# Patient Record
Sex: Female | Born: 1950
Health system: Southern US, Community
[De-identification: ages and names within clinical notes are randomized; demographics above are authoritative.]

## PROBLEM LIST (undated history)

## (undated) DIAGNOSIS — T7840XA Allergy, unspecified, initial encounter: Secondary | ICD-10-CM

## (undated) DIAGNOSIS — K219 Gastro-esophageal reflux disease without esophagitis: Secondary | ICD-10-CM

## (undated) DIAGNOSIS — G2581 Restless legs syndrome: Secondary | ICD-10-CM

## (undated) DIAGNOSIS — G894 Chronic pain syndrome: Secondary | ICD-10-CM

## (undated) DIAGNOSIS — F32A Depression, unspecified: Secondary | ICD-10-CM

## (undated) DIAGNOSIS — I2510 Atherosclerotic heart disease of native coronary artery without angina pectoris: Secondary | ICD-10-CM

## (undated) DIAGNOSIS — M5136 Other intervertebral disc degeneration, lumbar region: Secondary | ICD-10-CM

## (undated) DIAGNOSIS — R7302 Impaired glucose tolerance (oral): Secondary | ICD-10-CM

## (undated) DIAGNOSIS — I10 Essential (primary) hypertension: Secondary | ICD-10-CM

## (undated) DIAGNOSIS — I739 Peripheral vascular disease, unspecified: Secondary | ICD-10-CM

## (undated) DIAGNOSIS — M810 Age-related osteoporosis without current pathological fracture: Secondary | ICD-10-CM

## (undated) DIAGNOSIS — E039 Hypothyroidism, unspecified: Secondary | ICD-10-CM

## (undated) DIAGNOSIS — H269 Unspecified cataract: Secondary | ICD-10-CM

## (undated) DIAGNOSIS — E785 Hyperlipidemia, unspecified: Secondary | ICD-10-CM

## (undated) DIAGNOSIS — I639 Cerebral infarction, unspecified: Secondary | ICD-10-CM

## (undated) DIAGNOSIS — F1910 Other psychoactive substance abuse, uncomplicated: Secondary | ICD-10-CM

## (undated) DIAGNOSIS — J45909 Unspecified asthma, uncomplicated: Secondary | ICD-10-CM

## (undated) DIAGNOSIS — J309 Allergic rhinitis, unspecified: Secondary | ICD-10-CM

## (undated) DIAGNOSIS — F419 Anxiety disorder, unspecified: Secondary | ICD-10-CM

## (undated) DIAGNOSIS — G459 Transient cerebral ischemic attack, unspecified: Secondary | ICD-10-CM

## (undated) DIAGNOSIS — F1021 Alcohol dependence, in remission: Secondary | ICD-10-CM

## (undated) DIAGNOSIS — Z0000 Encounter for general adult medical examination without abnormal findings: Secondary | ICD-10-CM

## (undated) DIAGNOSIS — N951 Menopausal and female climacteric states: Secondary | ICD-10-CM

## (undated) DIAGNOSIS — Z9189 Other specified personal risk factors, not elsewhere classified: Secondary | ICD-10-CM

## (undated) DIAGNOSIS — Z86010 Personal history of colonic polyps: Secondary | ICD-10-CM

## (undated) HISTORY — DX: Anxiety disorder, unspecified: F41.9

## (undated) HISTORY — DX: Age-related osteoporosis without current pathological fracture: M81.0

## (undated) HISTORY — DX: Hyperlipidemia, unspecified: E78.5

## (undated) HISTORY — DX: Impaired glucose tolerance (oral): R73.02

## (undated) HISTORY — DX: Other psychoactive substance abuse, uncomplicated: F19.10

## (undated) HISTORY — DX: Allergic rhinitis, unspecified: J30.9

## (undated) HISTORY — DX: Unspecified asthma, uncomplicated: J45.909

## (undated) HISTORY — DX: Encounter for general adult medical examination without abnormal findings: Z00.00

## (undated) HISTORY — PX: OTHER SURGICAL HISTORY: SHX169

## (undated) HISTORY — PX: BREAST SURGERY: SHX581

## (undated) HISTORY — DX: Hypothyroidism, unspecified: E03.9

## (undated) HISTORY — DX: Other specified personal risk factors, not elsewhere classified: Z91.89

## (undated) HISTORY — DX: Depression, unspecified: F32.A

## (undated) HISTORY — DX: Other intervertebral disc degeneration, lumbar region: M51.36

## (undated) HISTORY — PX: COLONOSCOPY: SHX174

## (undated) HISTORY — DX: Cerebral infarction, unspecified: I63.9

## (undated) HISTORY — DX: Alcohol dependence, in remission: F10.21

## (undated) HISTORY — PX: BREAST EXCISIONAL BIOPSY: SUR124

## (undated) HISTORY — DX: Chronic pain syndrome: G89.4

## (undated) HISTORY — DX: Restless legs syndrome: G25.81

## (undated) HISTORY — DX: Menopausal and female climacteric states: N95.1

## (undated) HISTORY — DX: Unspecified cataract: H26.9

## (undated) HISTORY — DX: Allergy, unspecified, initial encounter: T78.40XA

## (undated) HISTORY — DX: Essential (primary) hypertension: I10

## (undated) HISTORY — DX: Transient cerebral ischemic attack, unspecified: G45.9

## (undated) HISTORY — DX: Personal history of colonic polyps: Z86.010

---

## 2000-12-14 ENCOUNTER — Encounter: Payer: Self-pay | Admitting: Internal Medicine

## 2001-04-18 ENCOUNTER — Encounter: Admission: RE | Admit: 2001-04-18 | Discharge: 2001-04-18 | Payer: Self-pay | Admitting: Internal Medicine

## 2001-04-18 ENCOUNTER — Encounter: Payer: Self-pay | Admitting: Internal Medicine

## 2003-03-10 ENCOUNTER — Emergency Department (HOSPITAL_COMMUNITY): Admission: EM | Admit: 2003-03-10 | Discharge: 2003-03-10 | Payer: Self-pay | Admitting: Emergency Medicine

## 2003-03-10 ENCOUNTER — Encounter: Payer: Self-pay | Admitting: Emergency Medicine

## 2003-09-15 ENCOUNTER — Emergency Department (HOSPITAL_COMMUNITY): Admission: EM | Admit: 2003-09-15 | Discharge: 2003-09-15 | Payer: Self-pay | Admitting: Emergency Medicine

## 2004-04-07 ENCOUNTER — Ambulatory Visit (HOSPITAL_COMMUNITY): Admission: RE | Admit: 2004-04-07 | Discharge: 2004-04-07 | Payer: Self-pay | Admitting: General Surgery

## 2005-09-11 ENCOUNTER — Ambulatory Visit: Payer: Self-pay | Admitting: Internal Medicine

## 2005-09-14 ENCOUNTER — Ambulatory Visit: Payer: Self-pay | Admitting: Internal Medicine

## 2005-09-22 ENCOUNTER — Ambulatory Visit: Payer: Self-pay | Admitting: Internal Medicine

## 2006-02-02 ENCOUNTER — Ambulatory Visit: Payer: Self-pay | Admitting: Internal Medicine

## 2006-09-29 ENCOUNTER — Ambulatory Visit: Payer: Self-pay | Admitting: Internal Medicine

## 2006-10-29 ENCOUNTER — Ambulatory Visit: Payer: Self-pay | Admitting: Internal Medicine

## 2006-10-29 LAB — CONVERTED CEMR LAB
ALT: 14 units/L (ref 0–40)
AST: 21 units/L (ref 0–37)
Albumin: 3.8 g/dL (ref 3.5–5.2)
Alkaline Phosphatase: 67 units/L (ref 39–117)
BUN: 18 mg/dL (ref 6–23)
Basophils Absolute: 0 10*3/uL (ref 0.0–0.1)
Basophils Relative: 0.4 % (ref 0.0–1.0)
Bilirubin Urine: NEGATIVE
CO2: 31 meq/L (ref 19–32)
Calcium: 9.4 mg/dL (ref 8.4–10.5)
Chloride: 103 meq/L (ref 96–112)
Chol/HDL Ratio, serum: 2.6
Cholesterol: 199 mg/dL (ref 0–200)
Creatinine, Ser: 0.9 mg/dL (ref 0.4–1.2)
Eosinophil percent: 1.6 % (ref 0.0–5.0)
GFR calc non Af Amer: 69 mL/min
Glomerular Filtration Rate, Af Am: 84 mL/min/{1.73_m2}
Glucose, Bld: 101 mg/dL — ABNORMAL HIGH (ref 70–99)
HCT: 37 % (ref 36.0–46.0)
HDL: 75.8 mg/dL (ref >39.0)
Hemoglobin, Urine: NEGATIVE
Hemoglobin: 12.2 g/dL (ref 12.0–15.0)
Ketones, ur: NEGATIVE mg/dL
LDL Cholesterol: 113 mg/dL — ABNORMAL HIGH (ref 0–99)
Leukocytes, UA: NEGATIVE
Lymphocytes Relative: 33 % (ref 12.0–46.0)
MCHC: 33.1 g/dL (ref 30.0–36.0)
MCV: 92.2 fL (ref 78.0–100.0)
Monocytes Absolute: 0.5 10*3/uL (ref 0.2–0.7)
Monocytes Relative: 8.5 % (ref 3.0–11.0)
Neutro Abs: 3 10*3/uL (ref 1.4–7.7)
Neutrophils Relative %: 56.5 % (ref 43.0–77.0)
Nitrite: NEGATIVE
Platelets: 176 10*3/uL (ref 150–400)
Potassium: 4.1 meq/L (ref 3.5–5.1)
RBC: 4.01 M/uL (ref 3.87–5.11)
RDW: 12.6 % (ref 11.5–14.6)
Sodium: 142 meq/L (ref 135–145)
Specific Gravity, Urine: 1.015 (ref 1.000–1.03)
TSH: 1.04 microintl units/mL (ref 0.35–5.50)
Total Bilirubin: 0.9 mg/dL (ref 0.3–1.2)
Total Protein, Urine: NEGATIVE mg/dL
Total Protein: 6.9 g/dL (ref 6.0–8.3)
Triglyceride fasting, serum: 50 mg/dL (ref 0–149)
Urine Glucose: NEGATIVE mg/dL
Urobilinogen, UA: 0.2 (ref 0.0–1.0)
VLDL: 10 mg/dL (ref 0–40)
WBC: 5.3 10*3/uL (ref 4.5–10.5)
pH: 7 (ref 5.0–8.0)

## 2006-11-03 ENCOUNTER — Ambulatory Visit: Payer: Self-pay | Admitting: Internal Medicine

## 2006-11-18 ENCOUNTER — Ambulatory Visit: Payer: Self-pay | Admitting: Internal Medicine

## 2006-12-03 ENCOUNTER — Ambulatory Visit: Payer: Self-pay | Admitting: Internal Medicine

## 2006-12-03 ENCOUNTER — Encounter (INDEPENDENT_AMBULATORY_CARE_PROVIDER_SITE_OTHER): Payer: Self-pay | Admitting: *Deleted

## 2007-03-10 ENCOUNTER — Other Ambulatory Visit: Admission: RE | Admit: 2007-03-10 | Discharge: 2007-03-10 | Payer: Self-pay | Admitting: Radiology

## 2007-04-14 LAB — CONVERTED CEMR LAB: Pap Smear: NORMAL

## 2007-04-20 ENCOUNTER — Ambulatory Visit (HOSPITAL_BASED_OUTPATIENT_CLINIC_OR_DEPARTMENT_OTHER): Admission: RE | Admit: 2007-04-20 | Discharge: 2007-04-20 | Payer: Self-pay | Admitting: General Surgery

## 2007-04-20 ENCOUNTER — Encounter (INDEPENDENT_AMBULATORY_CARE_PROVIDER_SITE_OTHER): Payer: Self-pay | Admitting: Specialist

## 2007-05-04 ENCOUNTER — Ambulatory Visit (HOSPITAL_BASED_OUTPATIENT_CLINIC_OR_DEPARTMENT_OTHER): Admission: RE | Admit: 2007-05-04 | Discharge: 2007-05-04 | Payer: Self-pay | Admitting: General Surgery

## 2007-09-13 ENCOUNTER — Encounter: Payer: Self-pay | Admitting: Internal Medicine

## 2007-09-13 DIAGNOSIS — I10 Essential (primary) hypertension: Secondary | ICD-10-CM

## 2007-09-13 DIAGNOSIS — N951 Menopausal and female climacteric states: Secondary | ICD-10-CM

## 2007-09-13 DIAGNOSIS — F5104 Psychophysiologic insomnia: Secondary | ICD-10-CM | POA: Insufficient documentation

## 2007-09-13 DIAGNOSIS — J45909 Unspecified asthma, uncomplicated: Secondary | ICD-10-CM

## 2007-09-13 DIAGNOSIS — Z9189 Other specified personal risk factors, not elsewhere classified: Secondary | ICD-10-CM

## 2007-09-13 HISTORY — DX: Other specified personal risk factors, not elsewhere classified: Z91.89

## 2007-09-13 HISTORY — DX: Essential (primary) hypertension: I10

## 2007-09-13 HISTORY — DX: Menopausal and female climacteric states: N95.1

## 2007-09-13 HISTORY — DX: Unspecified asthma, uncomplicated: J45.909

## 2007-11-06 DIAGNOSIS — F1021 Alcohol dependence, in remission: Secondary | ICD-10-CM

## 2007-11-06 DIAGNOSIS — E039 Hypothyroidism, unspecified: Secondary | ICD-10-CM

## 2007-11-06 DIAGNOSIS — M810 Age-related osteoporosis without current pathological fracture: Secondary | ICD-10-CM

## 2007-11-06 DIAGNOSIS — J309 Allergic rhinitis, unspecified: Secondary | ICD-10-CM | POA: Insufficient documentation

## 2007-11-06 HISTORY — DX: Alcohol dependence, in remission: F10.21

## 2007-11-06 HISTORY — DX: Age-related osteoporosis without current pathological fracture: M81.0

## 2007-11-06 HISTORY — DX: Allergic rhinitis, unspecified: J30.9

## 2007-11-06 HISTORY — DX: Hypothyroidism, unspecified: E03.9

## 2007-12-29 ENCOUNTER — Encounter: Payer: Self-pay | Admitting: Internal Medicine

## 2008-01-27 ENCOUNTER — Ambulatory Visit: Payer: Self-pay | Admitting: Internal Medicine

## 2008-01-27 LAB — CONVERTED CEMR LAB
ALT: 13 units/L (ref 0–35)
AST: 16 units/L (ref 0–37)
Albumin: 4.2 g/dL (ref 3.5–5.2)
Alkaline Phosphatase: 57 units/L (ref 39–117)
BUN: 15 mg/dL (ref 6–23)
Basophils Absolute: 0 10*3/uL (ref 0.0–0.1)
Basophils Relative: 0.2 % (ref 0.0–1.0)
Bilirubin Urine: NEGATIVE
CO2: 34 meq/L — ABNORMAL HIGH (ref 19–32)
Calcium: 9.6 mg/dL (ref 8.4–10.5)
Chloride: 104 meq/L (ref 96–112)
Creatinine, Ser: 0.8 mg/dL (ref 0.4–1.2)
HDL: 83.7 mg/dL (ref >39.0)
Hemoglobin, Urine: NEGATIVE
Ketones, ur: NEGATIVE mg/dL
LDL Cholesterol: 104 mg/dL — ABNORMAL HIGH (ref 0–99)
Leukocytes, UA: NEGATIVE
MCHC: 33.2 g/dL (ref 30.0–36.0)
Monocytes Relative: 9.7 % (ref 3.0–11.0)
Platelets: 202 10*3/uL (ref 150–400)
Potassium: 3.9 meq/L (ref 3.5–5.1)
RBC: 4.4 M/uL (ref 3.87–5.11)
RDW: 13.2 % (ref 11.5–14.6)
Specific Gravity, Urine: 1.015 (ref 1.000–1.03)
Total Bilirubin: 0.9 mg/dL (ref 0.3–1.2)
Total Protein, Urine: NEGATIVE mg/dL
Triglycerides: 54 mg/dL (ref 0–149)
VLDL: 11 mg/dL (ref 0–40)
pH: 6 (ref 5.0–8.0)

## 2008-02-09 ENCOUNTER — Ambulatory Visit: Payer: Self-pay | Admitting: Internal Medicine

## 2008-02-09 DIAGNOSIS — Z860101 Personal history of adenomatous and serrated colon polyps: Secondary | ICD-10-CM | POA: Insufficient documentation

## 2008-02-09 DIAGNOSIS — Z860100 Personal history of colon polyps, unspecified: Secondary | ICD-10-CM

## 2008-02-09 HISTORY — DX: Personal history of colonic polyps: Z86.010

## 2008-02-09 HISTORY — DX: Personal history of colon polyps, unspecified: Z86.0100

## 2008-02-14 ENCOUNTER — Encounter: Payer: Self-pay | Admitting: Internal Medicine

## 2008-02-14 ENCOUNTER — Ambulatory Visit: Payer: Self-pay | Admitting: Internal Medicine

## 2008-08-09 ENCOUNTER — Encounter: Payer: Self-pay | Admitting: Internal Medicine

## 2008-08-29 ENCOUNTER — Telehealth: Payer: Self-pay | Admitting: Internal Medicine

## 2009-02-13 ENCOUNTER — Ambulatory Visit: Payer: Self-pay | Admitting: Internal Medicine

## 2009-02-14 LAB — CONVERTED CEMR LAB
Alkaline Phosphatase: 65 units/L (ref 39–117)
Bilirubin Urine: NEGATIVE
Bilirubin, Direct: 0.1 mg/dL (ref 0.0–0.3)
Eosinophils Absolute: 0.2 10*3/uL (ref 0.0–0.7)
GFR calc Af Amer: 95 mL/min
GFR calc non Af Amer: 79 mL/min
HCT: 39.7 % (ref 36.0–46.0)
HDL: 70 mg/dL (ref >39.0)
Hemoglobin, Urine: NEGATIVE
LDL Cholesterol: 104 mg/dL — ABNORMAL HIGH (ref 0–99)
MCV: 91.1 fL (ref 78.0–100.0)
Monocytes Absolute: 0.6 10*3/uL (ref 0.1–1.0)
Nitrite: NEGATIVE
Platelets: 200 10*3/uL (ref 150–400)
Potassium: 3.6 meq/L (ref 3.5–5.1)
RDW: 13.3 % (ref 11.5–14.6)
Sodium: 140 meq/L (ref 135–145)
Total Bilirubin: 0.8 mg/dL (ref 0.3–1.2)
Total Protein, Urine: NEGATIVE mg/dL
Urine Glucose: NEGATIVE mg/dL
Urobilinogen, UA: 0.2 (ref 0.0–1.0)
VLDL: 19 mg/dL (ref 0–40)

## 2009-03-16 ENCOUNTER — Emergency Department (HOSPITAL_COMMUNITY): Admission: EM | Admit: 2009-03-16 | Discharge: 2009-03-16 | Payer: Self-pay | Admitting: Family Medicine

## 2009-06-07 ENCOUNTER — Telehealth: Payer: Self-pay | Admitting: Internal Medicine

## 2009-08-05 ENCOUNTER — Telehealth: Payer: Self-pay | Admitting: Internal Medicine

## 2009-08-16 ENCOUNTER — Encounter: Payer: Self-pay | Admitting: Internal Medicine

## 2009-08-16 ENCOUNTER — Telehealth: Payer: Self-pay | Admitting: Internal Medicine

## 2009-08-20 ENCOUNTER — Encounter: Payer: Self-pay | Admitting: Internal Medicine

## 2009-10-11 ENCOUNTER — Encounter (INDEPENDENT_AMBULATORY_CARE_PROVIDER_SITE_OTHER): Payer: Self-pay | Admitting: *Deleted

## 2009-10-24 ENCOUNTER — Ambulatory Visit: Payer: Self-pay | Admitting: Internal Medicine

## 2009-10-24 LAB — CONVERTED CEMR LAB
ALT: 13 units/L (ref 0–35)
BUN: 14 mg/dL (ref 6–23)
Bilirubin, Direct: 0.1 mg/dL (ref 0.0–0.3)
Chloride: 102 meq/L (ref 96–112)
Cholesterol: 177 mg/dL (ref 0–200)
Creatinine, Ser: 0.8 mg/dL (ref 0.4–1.2)
Eosinophils Absolute: 0.1 10*3/uL (ref 0.0–0.7)
Eosinophils Relative: 2.7 % (ref 0.0–5.0)
GFR calc non Af Amer: 94.91 mL/min (ref >60)
HDL: 62.8 mg/dL (ref >39.00)
LDL Cholesterol: 102 mg/dL — ABNORMAL HIGH (ref 0–99)
MCV: 95.2 fL (ref 78.0–100.0)
Monocytes Absolute: 0.6 10*3/uL (ref 0.1–1.0)
Neutrophils Relative %: 46.8 % (ref 43.0–77.0)
Nitrite: NEGATIVE
Platelets: 180 10*3/uL (ref 150.0–400.0)
Specific Gravity, Urine: 1.015 (ref 1.000–1.030)
Total Bilirubin: 1 mg/dL (ref 0.3–1.2)
Total Protein, Urine: NEGATIVE mg/dL
Triglycerides: 60 mg/dL (ref 0.0–149.0)
VLDL: 12 mg/dL (ref 0.0–40.0)
WBC: 5.4 10*3/uL (ref 4.5–10.5)
pH: 6 (ref 5.0–8.0)

## 2009-11-01 ENCOUNTER — Ambulatory Visit: Payer: Self-pay | Admitting: Internal Medicine

## 2009-11-13 ENCOUNTER — Encounter (INDEPENDENT_AMBULATORY_CARE_PROVIDER_SITE_OTHER): Payer: Self-pay | Admitting: *Deleted

## 2009-11-19 ENCOUNTER — Encounter (INDEPENDENT_AMBULATORY_CARE_PROVIDER_SITE_OTHER): Payer: Self-pay | Admitting: *Deleted

## 2009-11-20 ENCOUNTER — Ambulatory Visit: Payer: Self-pay | Admitting: Internal Medicine

## 2009-12-04 ENCOUNTER — Ambulatory Visit: Payer: Self-pay | Admitting: Internal Medicine

## 2009-12-11 ENCOUNTER — Encounter: Payer: Self-pay | Admitting: Internal Medicine

## 2010-02-05 ENCOUNTER — Telehealth: Payer: Self-pay | Admitting: Internal Medicine

## 2010-07-25 ENCOUNTER — Telehealth: Payer: Self-pay | Admitting: Internal Medicine

## 2010-08-05 ENCOUNTER — Telehealth (INDEPENDENT_AMBULATORY_CARE_PROVIDER_SITE_OTHER): Payer: Self-pay | Admitting: *Deleted

## 2010-08-06 ENCOUNTER — Encounter: Payer: Self-pay | Admitting: Internal Medicine

## 2010-10-20 ENCOUNTER — Telehealth: Payer: Self-pay | Admitting: Internal Medicine

## 2010-10-24 ENCOUNTER — Ambulatory Visit: Payer: Self-pay | Admitting: Internal Medicine

## 2010-10-24 LAB — CONVERTED CEMR LAB
ALT: 11 units/L (ref 0–35)
AST: 19 units/L (ref 0–37)
BUN: 17 mg/dL (ref 6–23)
Basophils Absolute: 0 10*3/uL (ref 0.0–0.1)
Bilirubin Urine: NEGATIVE
Bilirubin, Direct: 0.2 mg/dL (ref 0.0–0.3)
Cholesterol: 206 mg/dL — ABNORMAL HIGH (ref 0–200)
Creatinine, Ser: 0.8 mg/dL (ref 0.4–1.2)
Eosinophils Relative: 2.2 % (ref 0.0–5.0)
GFR calc non Af Amer: 98.84 mL/min (ref >60)
HDL: 76 mg/dL (ref >39.00)
Leukocytes, UA: NEGATIVE
Monocytes Absolute: 0.7 10*3/uL (ref 0.1–1.0)
Monocytes Relative: 10.6 % (ref 3.0–12.0)
Neutrophils Relative %: 55 % (ref 43.0–77.0)
Nitrite: NEGATIVE
Platelets: 211 10*3/uL (ref 150.0–400.0)
RDW: 14.2 % (ref 11.5–14.6)
Specific Gravity, Urine: 1.01 (ref 1.000–1.030)
Total Bilirubin: 1 mg/dL (ref 0.3–1.2)
Total Protein, Urine: NEGATIVE mg/dL
Triglycerides: 50 mg/dL (ref 0.0–149.0)
VLDL: 10 mg/dL (ref 0.0–40.0)
WBC: 6.5 10*3/uL (ref 4.5–10.5)
pH: 6.5 (ref 5.0–8.0)

## 2010-11-03 ENCOUNTER — Ambulatory Visit: Payer: Self-pay | Admitting: Internal Medicine

## 2010-11-03 ENCOUNTER — Telehealth: Payer: Self-pay | Admitting: Internal Medicine

## 2010-11-03 ENCOUNTER — Encounter: Payer: Self-pay | Admitting: Internal Medicine

## 2010-11-03 DIAGNOSIS — E785 Hyperlipidemia, unspecified: Secondary | ICD-10-CM | POA: Insufficient documentation

## 2010-11-03 HISTORY — DX: Hyperlipidemia, unspecified: E78.5

## 2010-11-05 ENCOUNTER — Telehealth (INDEPENDENT_AMBULATORY_CARE_PROVIDER_SITE_OTHER): Payer: Self-pay | Admitting: *Deleted

## 2010-11-11 ENCOUNTER — Encounter: Payer: Self-pay | Admitting: Internal Medicine

## 2011-01-04 ENCOUNTER — Encounter: Payer: Self-pay | Admitting: Obstetrics and Gynecology

## 2011-01-13 NOTE — Progress Notes (Signed)
  Phone Note Refill Request Message from:  Fax from Pharmacy on November 03, 2010 9:30 AM  Refills Requested: Medication #1:  KLOR-CON 10 10 MEQ TBCR Take 1 tablet by mouth once a day   Dosage confirmed as above?Dosage Confirmed   Last Refilled: 10/2009   Notes: [facility] Initial call taken by: Zella Ball Ewing CMA (AAMA),  November 03, 2010 9:30 AM    Prescriptions: KLOR-CON 10 10 MEQ TBCR (POTASSIUM CHLORIDE) Take 1 tablet by mouth once a day  #30 x 0   Entered by:   Scharlene Gloss CMA (AAMA)   Authorized by:   Corwin Levins MD   Signed by:   Scharlene Gloss CMA (AAMA) on 11/03/2010   Method used:   Faxed to ...       [facility] [id number] (retail)       [address] [place], [place]  [address]       Ph: [phone]       Fax: [phone]   RxID:   [id number]

## 2011-01-13 NOTE — Medication Information (Signed)
Summary: Prior Autho & Approved for Zolpidem/[facility]  Prior Autho & Approved for Zolpidem/[facility]   Imported By: Sherian Rein 08/08/2010 13:36:52  _____________________________________________________________________  External Attachment:    Type:   Image     Comment:   External Document

## 2011-01-13 NOTE — Progress Notes (Signed)
Summary: RX  Phone Note Refill Request  on February 05, 2010 4:45 PM  Refills Requested: Medication #1:  ZOLPIDEM TARTRATE 10 MG TABS Take 1 tablet by mouth once a day.   Dosage confirmed as above?Dosage Confirmed   Last Refilled: 08/05/2009   Notes: [facility] [phone] Initial call taken by: Scharlene Gloss,  February 05, 2010 4:46 PM  Follow-up for Phone Call        done hardcopy to LIM side B - dahlia  Follow-up by: Corwin Levins MD,  February 05, 2010 5:19 PM  Additional Follow-up for Phone Call Additional follow up Details #1::        faxed. Additional Follow-up by: Lucious Groves,  February 06, 2010 9:32 AM    New/Updated Medications: ZOLPIDEM TARTRATE 10 MG TABS (ZOLPIDEM TARTRATE) Take 1 tablet by mouth once a day Prescriptions: ZOLPIDEM TARTRATE 10 MG TABS (ZOLPIDEM TARTRATE) Take 1 tablet by mouth once a day  #30 x 5   Entered and Authorized by:   Corwin Levins MD   Signed by:   Corwin Levins MD on 02/05/2010   Method used:   Print then Give to Patient   RxID:   [id number]

## 2011-01-13 NOTE — Progress Notes (Signed)
Summary: PA-Zolpidem  Phone Note From Pharmacy   Summary of Call: PA-Zolpidem fax to [facility] @ [phone], awaiting approval. Initial call taken by: Dagoberto Reef,  August 05, 2010 4:41 PM  Follow-up for Phone Call        Zolpidem approved 07/16/10-08/06/11, case # [id number]  pt aware, Follow-up by: Dagoberto Reef,  August 06, 2010 2:46 PM

## 2011-01-13 NOTE — Progress Notes (Signed)
Summary: Rx refill req  Phone Note Refill Request Message from:  Patient on October 20, 2010 10:53 AM  Refills Requested: Medication #1:  ZOLPIDEM TARTRATE 10 MG TABS Take 1 tablet by mouth once a day - pleae make return office visit for further refills.   Dosage confirmed as above?Dosage Confirmed   Supply Requested: 3 months  Method Requested: Electronic Initial call taken by: Margaret Pyle, CMA,  October 20, 2010 10:54 AM    New/Updated Medications: ZOLPIDEM TARTRATE 10 MG TABS (ZOLPIDEM TARTRATE) Take 1 tablet by mouth once a day - pleae make return office visit for further refills Prescriptions: ZOLPIDEM TARTRATE 10 MG TABS (ZOLPIDEM TARTRATE) Take 1 tablet by mouth once a day - pleae make return office visit for further refills  #30 x 0   Entered and Authorized by:   Corwin Levins MD   Signed by:   Corwin Levins MD on 10/20/2010   Method used:   Print then Give to Patient   RxID:   [id number]  done hardcopy to LIM side B - dahlia Corwin Levins MD  October 20, 2010 1:19 PM   Pt informed, Rx faxed to [facility] [address], [place]  October 20, 2010 2:11 PM

## 2011-01-13 NOTE — Progress Notes (Signed)
Summary: Medication Refill  Phone Note Refill Request Message from:  Fax from Pharmacy on July 25, 2010 1:31 PM  Refills Requested: Medication #1:  ZOLPIDEM TARTRATE 10 MG TABS Take 1 tablet by mouth once a day.   Dosage confirmed as above?Dosage Confirmed   Last Refilled: 02/05/2010   Notes: [facility] [place]. [phone] Initial call taken by: Zella Ball Ewing CMA Duncan Dull),  July 25, 2010 1:32 PM  Follow-up for Phone Call        done hardcopy to LIM side B - dahlia  Follow-up by: Corwin Levins MD,  July 25, 2010 1:41 PM  Additional Follow-up for Phone Call Additional follow up Details #1::        Rx faxed to pharmacy Additional Follow-up by: Margaret Pyle, CMA,  July 25, 2010 1:48 PM    New/Updated Medications: ZOLPIDEM TARTRATE 10 MG TABS (ZOLPIDEM TARTRATE) Take 1 tablet by mouth once a day - pleae make return office visit for further refills Prescriptions: ZOLPIDEM TARTRATE 10 MG TABS (ZOLPIDEM TARTRATE) Take 1 tablet by mouth once a day - pleae make return office visit for further refills  #30 x 2   Entered and Authorized by:   Corwin Levins MD   Signed by:   Corwin Levins MD on 07/25/2010   Method used:   Print then Give to Patient   RxID:   [id number]

## 2011-01-13 NOTE — Assessment & Plan Note (Signed)
Summary: CPX/BCBS/#.cd   Vital Signs:  Patient profile:   60 year old female Height:      59 inches Weight:      144 pounds BMI:     29.19 O2 Sat:      95 % on Room air Temp:     98.1 degrees F oral Pulse rate:   83 / minute BP sitting:   142 / 82  (left arm) Cuff size:   regular  Vitals Entered By: Zella Ball Ewing CMA Duncan Dull) (November 03, 2010 2:11 PM)  O2 Flow:  Room air  Preventive Care Screening  Last Flu Shot:    Date:  09/29/2010    Results:  given   CC: Adult Physical/RE   CC:  Adult Physical/RE.  History of Present Illness: here for wellness and f/u - overall doing ok, but cannot afford the inhalers so not using and having midl sob.doe/wheezing;  Pt denies CP, worsening orthopnea, pnd, worsening LE edema, palps, dizziness or syncope Pt denies new neuro symptoms such as headache, facial or extremity weakness  Pt denies polydipsia, polyuria..  Overall good compliance with meds, trying to follow low chol diet, wt stable, little excercise however  Denies worsening depressive symptoms, suicidal ideation, or panic.  No fever, wt loss, night sweats, loss of appetite or other constitutional symptoms Overall good compliance with meds, and good tolerability.  Pt states good ability with ADL's, low fall risk, home safety reviewed and adequate, no significant change in hearing or vision, trying to follow lower chol diet, and occasionally active only with regular excercise.   Preventive Screening-Counseling & Management      Drug Use:  no.    Problems Prior to Update: 1)  Hyperlipidemia  (ICD-272.4) 2)  Preventive Health Care  (ICD-V70.0) 3)  Preventive Health Care  (ICD-V70.0) 4)  Colonic Polyps, Hx of  (ICD-V12.72) 5)  Routine General Medical Exam@health  Care Facl  (ICD-V70.0) 6)  Family History of Cad Female 1st Degree Relative <50  (ICD-V17.3) 7)  Hypothyroidism  (ICD-244.9) 8)  Allergic Rhinitis  (ICD-477.9) 9)  Alcohol Abuse, Hx of  (ICD-V11.3) 10)  Osteoporosis   (ICD-733.00) 11)  Perimenopausal Status  (ICD-627.2) 12)  Insomnia, Hx of  (ICD-V15.89) 13)  Hypertension  (ICD-401.9) 14)  Asthma  (ICD-493.90)  Medications Prior to Update: 1)  Klor-Con 10 10 Meq Tbcr (Potassium Chloride) .... Take 1 Tablet By Mouth Once A Day 2)  Lisinopril-Hydrochlorothiazide 20-12.5 Mg Tabs (Lisinopril-Hydrochlorothiazide) .... Take 2 Tablet By Mouth Once A Day 3)  Lovastatin 40 Mg Tabs (Lovastatin) .... Take 1 Tablet By Mouth Once A Day 4)  Advair Diskus 250-50 Mcg/dose Aepb (Fluticasone-Salmeterol) .Marland Kitchen.. 1  Puff Two Times A Day 5)  Proair Hfa 108 (90 Base) Mcg/act Aers (Albuterol Sulfate) .... 2 Puffs Four Times Per Day As Needed 6)  Zolpidem Tartrate 10 Mg Tabs (Zolpidem Tartrate) .... Take 1 Tablet By Mouth Once A Day - Pleae Make Return Office Visit For Further Refills  Current Medications (verified): 1)  Klor-Con 10 10 Meq Tbcr (Potassium Chloride) .... Take 1 Tablet By Mouth Once A Day 2)  Lisinopril-Hydrochlorothiazide 20-12.5 Mg Tabs (Lisinopril-Hydrochlorothiazide) .... Take 2 Tablet By Mouth Once A Day 3)  Lipitor 20 Mg Tabs (Atorvastatin Calcium) .... Generic - 1 By Mouth Once Daily  - To Start December 2011 4)  Advair Diskus 250-50 Mcg/dose Aepb (Fluticasone-Salmeterol) .Marland Kitchen.. 1  Puff Two Times A Day 5)  Proair Hfa 108 (90 Base) Mcg/act Aers (Albuterol Sulfate) .... 2 Puffs Four Times  Per Day As Needed 6)  Zolpidem Tartrate 10 Mg Tabs (Zolpidem Tartrate) .... Take 1 Tablet By Mouth Once A Day As Needed 7)  Singulair 10 Mg Tabs (Montelukast Sodium) .Marland Kitchen.. 1po Once Daily  Allergies (verified): 1)  ! Pcn 2)  * Alendronate  Past History:  Past Surgical History: Last updated: 02/09/2008 c-section x 3 s/p left breast surugry 5/08 - benign  Family History: Last updated: 11/06/2007 Family History of CAD Female 1st degree relative Family History High cholesterol Family History Hypertension  Social History: Last updated: 11/03/2010 Never Smoked Alcohol  use-no Married 3 children work - Youth worker - [employer] Drug use-no  Risk Factors: Smoking Status: never (11/06/2007)  Past Medical History: Asthma Hypertension Insomnia, Hx of Osteoporosis alcohol dependence/abuse Allergic rhinitis Hypothyroidism s/p Radioactive I131 Colonic polyps, hx of Hyperlipidemia  Social History: Never Smoked Alcohol use-no Married 3 children work - Youth worker - [employer] Drug use-no Drug Use:  no  Review of Systems  The patient denies anorexia, fever, vision loss, decreased hearing, hoarseness, chest pain, syncope, dyspnea on exertion, peripheral edema, prolonged cough, headaches, hemoptysis, abdominal pain, melena, hematochezia, severe indigestion/heartburn, hematuria, muscle weakness, suspicious skin lesions, transient blindness, difficulty walking, depression, unusual weight change, abnormal bleeding, enlarged lymph nodes, and angioedema.         all otherwise negative per pt -    Physical Exam  General:  alert and well-developed.   Head:  normocephalic and atraumatic.   Eyes:  vision grossly intact, pupils equal, and pupils round.   Ears:  R ear normal and L ear normal.   Nose:  no external deformity and no nasal discharge.   Mouth:  no gingival abnormalities and pharynx pink and moist.   Neck:  supple and no masses.   Lungs:  normal respiratory effort and normal breath sounds.   Heart:  normal rate and regular rhythm.   Abdomen:  soft, non-tender, and normal bowel sounds.   Msk:  no joint tenderness and no joint swelling.   Extremities:  no edema, no erythema  Neurologic:  cranial nerves II-XII intact and strength normal in all extremities.   Skin:  color normal and no rashes.   Psych:  not depressed appearing and slightly anxious.     Impression & Recommendations:  Problem # 1:  PREVENTIVE HEALTH CARE (ICD-V70.0) Overall doing well, age appropriate education and counseling updated, referral for preventive  services and immunizations addressed, dietary counseling and smoking status adressed , most recent labs reviewed, ecg reviewed I have personally reviewed and have noted 1.The patient's medical and social history 2.Their use of alcohol, tobacco or illicit drugs 3.Their current medications and supplements 4. Functional ability including ADL's, fall risk, home safety risk, hearing & visual impairment  5.Diet and physical activities 6.Evidence for depression or mood disorders The patients weight, height, BMI  have been recorded in the chart I have made referrals, counseling and provided education to the patient based review of the above  Orders: EKG w/ Interpretation (93000)  Problem # 2:  ASTHMA (ICD-493.90)  Her updated medication list for this problem includes:    Advair Diskus 250-50 Mcg/dose Aepb (Fluticasone-salmeterol) .Marland Kitchen... 1  puff two times a day    Proair Hfa 108 (90 Base) Mcg/act Aers (Albuterol sulfate) .Marland Kitchen... 2 puffs four times per day as needed    Singulair 10 Mg Tabs (Montelukast sodium) .Marland Kitchen... 1po once daily unable to afford inhalers - for singulair once daily   Problem # 3:  HYPERTENSION (ICD-401.9)  Her updated medication list for this problem includes:    Lisinopril-hydrochlorothiazide 20-12.5 Mg Tabs (Lisinopril-hydrochlorothiazide) .Marland Kitchen... Take 2 tablet by mouth once a day stable overall by hx and exam, ok to continue meds/tx as is   BP today: 142/82 Prior BP: 124/82 (11/01/2009)  Labs Reviewed: K+: 4.2 (10/24/2010) Creat: : 0.8 (10/24/2010)   Chol: 206 (10/24/2010)   HDL: 76.00 (10/24/2010)   LDL: 102 (10/24/2009)   TG: 50.0 (10/24/2010)  Problem # 4:  HYPOTHYROIDISM (ICD-244.9) s/p radioactive iodine for hyperthyroid - apparently requires no replacement med since then Labs Reviewed: TSH: 1.44 (10/24/2010)    Chol: 206 (10/24/2010)   HDL: 76.00 (10/24/2010)   LDL: 102 (10/24/2009)   TG: 50.0 (10/24/2010) stable overall by hx and exam, ok to continue meds/tx as is   - does not need med now   Problem # 5:  HYPERLIPIDEMIA (ICD-272.4)  Her updated medication list for this problem includes:    Lipitor 20 Mg Tabs (Atorvastatin calcium) .Marland Kitchen... Generic - 1 by mouth once daily  - to start december 2011 uncontrolled - to change to lipitor 20 mg when generic soon  Labs Reviewed: SGOT: 19 (10/24/2010)   SGPT: 11 (10/24/2010)   HDL:76.00 (10/24/2010), 62.80 (10/24/2009)  LDL:102 (10/24/2009), 104 (02/13/2009)  Chol:206 (10/24/2010), 177 (10/24/2009)  Trig:50.0 (10/24/2010), 60.0 (10/24/2009)  Complete Medication List: 1)  Klor-con 10 10 Meq Tbcr (Potassium chloride) .... Take 1 tablet by mouth once a day 2)  Lisinopril-hydrochlorothiazide 20-12.5 Mg Tabs (Lisinopril-hydrochlorothiazide) .... Take 2 tablet by mouth once a day 3)  Lipitor 20 Mg Tabs (Atorvastatin calcium) .... Generic - 1 by mouth once daily  - to start december 2011 4)  Advair Diskus 250-50 Mcg/dose Aepb (Fluticasone-salmeterol) .Marland Kitchen.. 1  puff two times a day 5)  Proair Hfa 108 (90 Base) Mcg/act Aers (Albuterol sulfate) .... 2 puffs four times per day as needed 6)  Zolpidem Tartrate 10 Mg Tabs (Zolpidem tartrate) .... Take 1 tablet by mouth once a day as needed 7)  Singulair 10 Mg Tabs (Montelukast sodium) .Marland Kitchen.. 1po once daily  Patient Instructions: 1)  Please take all new medications as prescribed - you can use the singulair 10 mg if the inhalers are too expensive 2)  continue the lovastatin for one more month, then change to the generic lipitor 20 mg per day after that 3)  Continue all other previous medications as before this visit  4)  Please schedule a follow-up appointment in 1 year, or sooner if needed Prescriptions: SINGULAIR 10 MG TABS (MONTELUKAST SODIUM) 1po once daily  #90 x 3   Entered and Authorized by:   Corwin Levins MD   Signed by:   Corwin Levins MD on 11/03/2010   Method used:   Print then Give to Patient   RxID:   [id number] LIPITOR 20 MG TABS (ATORVASTATIN CALCIUM)  generic - 1 by mouth once daily  - to start december 2011  #90 x 3   Entered and Authorized by:   Corwin Levins MD   Signed by:   Corwin Levins MD on 11/03/2010   Method used:   Print then Give to Patient   RxID:   [id number] ZOLPIDEM TARTRATE 10 MG TABS (ZOLPIDEM TARTRATE) Take 1 tablet by mouth once a day as needed  #30 x 5   Entered and Authorized by:   Corwin Levins MD   Signed by:   Corwin Levins MD on 11/03/2010   Method used:   Print then Give  to Patient   RxID:   [id number] PROAIR HFA 108 (90 BASE) MCG/ACT AERS (ALBUTEROL SULFATE) 2 puffs four times per day as needed  #1 x 11   Entered and Authorized by:   Corwin Levins MD   Signed by:   Corwin Levins MD on 11/03/2010   Method used:   Print then Give to Patient   RxID:   [id number] ADVAIR DISKUS 250-50 MCG/DOSE AEPB (FLUTICASONE-SALMETEROL) 1  puff two times a day  #1 x 11   Entered and Authorized by:   Corwin Levins MD   Signed by:   Corwin Levins MD on 11/03/2010   Method used:   Print then Give to Patient   RxID:   [id number] LOVASTATIN 40 MG TABS (LOVASTATIN) Take 1 tablet by mouth once a day  #30 x 0   Entered and Authorized by:   Corwin Levins MD   Signed by:   Corwin Levins MD on 11/03/2010   Method used:   Electronically to        [facility] [id number] (retail)       [address]       [place], [place]  [address]       Ph: [phone]       Fax: [phone]   RxID:   [id number] LISINOPRIL-HYDROCHLOROTHIAZIDE 20-12.5 MG TABS (LISINOPRIL-HYDROCHLOROTHIAZIDE) Take 2 tablet by mouth once a day  #180 x 3   Entered and Authorized by:   Corwin Levins MD   Signed by:   Corwin Levins MD on 11/03/2010   Method used:   Electronically to        [facility] [address] (retail)       [address]       [place], [place]  [address]       Ph: [phone]       Fax: [phone]   RxID:   [id number] KLOR-CON 10 10 MEQ TBCR (POTASSIUM CHLORIDE) Take 1  tablet by mouth once a day  #90 x 3   Entered and Authorized by:   Corwin Levins MD   Signed by:   Corwin Levins MD on 11/03/2010   Method used:   Electronically to        [facility] [address] (retail)       [address] [place], [place]  [address]       Ph: [phone]       Fax: [phone]   RxID:   [id number]    Orders Added: 1)  EKG w/ Interpretation [93000] 2)  Est. Patient 40-64 years [phone]

## 2011-01-15 NOTE — Progress Notes (Signed)
Summary: PA SINGULAIR  Phone Note From Other Clinic   Caller: PA # [phone]  Summary of Call: Singulair requires PA. Ok to proceed?  Initial call taken by: Lamar Sprinkles, CMA,  November 05, 2010 5:28 PM  Follow-up for Phone Call        ok Follow-up by: Corwin Levins MD,  November 06, 2010 10:45 AM  Additional Follow-up for Phone Call Additional follow up Details #1::        [facility] will send paperwork case number [id number]. Paperwork recieved and forwarded to PCP.  paperwork signed and faxed to [employer]. Margaret Pyle, CMA  November 11, 2010 4:44 PM  Additional Follow-up by: Margaret Pyle, CMA,  November 11, 2010 3:05 PM    Additional Follow-up for Phone Call Additional follow up Details #2::    PA approved  10/21/10-11/11/11. Follow-up by: Dagoberto Reef,  December 04, 2010 11:38 AM

## 2011-01-15 NOTE — Medication Information (Signed)
Summary: Prior autho & approved for Singulair/[facility]  Prior autho & approved for Singulair/[facility]   Imported By: Sherian Rein 12/10/2010 11:43:48  _____________________________________________________________________  External Attachment:    Type:   Image     Comment:   External Document

## 2011-03-04 ENCOUNTER — Inpatient Hospital Stay (HOSPITAL_COMMUNITY)
Admission: EM | Admit: 2011-03-04 | Discharge: 2011-03-10 | DRG: 838 | Disposition: A | Discharge status: Home / Self Care | Payer: BC Managed Care – PPO | Attending: Vascular Surgery | Admitting: Vascular Surgery

## 2011-03-04 ENCOUNTER — Inpatient Hospital Stay (HOSPITAL_COMMUNITY): Payer: BC Managed Care – PPO

## 2011-03-04 ENCOUNTER — Emergency Department (HOSPITAL_COMMUNITY): Payer: BC Managed Care – PPO

## 2011-03-04 ENCOUNTER — Telehealth: Payer: Self-pay

## 2011-03-04 DIAGNOSIS — J4489 Other specified chronic obstructive pulmonary disease: Secondary | ICD-10-CM | POA: Diagnosis present

## 2011-03-04 DIAGNOSIS — E039 Hypothyroidism, unspecified: Secondary | ICD-10-CM | POA: Diagnosis present

## 2011-03-04 DIAGNOSIS — M810 Age-related osteoporosis without current pathological fracture: Secondary | ICD-10-CM | POA: Diagnosis present

## 2011-03-04 DIAGNOSIS — E785 Hyperlipidemia, unspecified: Secondary | ICD-10-CM | POA: Diagnosis present

## 2011-03-04 DIAGNOSIS — I10 Essential (primary) hypertension: Secondary | ICD-10-CM | POA: Diagnosis present

## 2011-03-04 DIAGNOSIS — I63239 Cerebral infarction due to unspecified occlusion or stenosis of unspecified carotid arteries: Principal | ICD-10-CM | POA: Diagnosis present

## 2011-03-04 DIAGNOSIS — J449 Chronic obstructive pulmonary disease, unspecified: Secondary | ICD-10-CM | POA: Diagnosis present

## 2011-03-04 LAB — CBC
HCT: 41 % (ref 36.0–46.0)
Hemoglobin: 14.3 g/dL (ref 12.0–15.0)
MCH: 30.4 pg (ref 26.0–34.0)
MCH: 29.6 pg (ref 26.0–34.0)
MCV: 87 fL (ref 78.0–100.0)
MCV: 87.9 fL (ref 78.0–100.0)
Platelets: 189 10*3/uL (ref 150–400)
RBC: 4.71 MIL/uL (ref 3.87–5.11)
RBC: 4.56 MIL/uL (ref 3.87–5.11)
RDW: 13.8 % (ref 11.5–15.5)
WBC: 6.8 10*3/uL (ref 4.0–10.5)

## 2011-03-04 LAB — COMPREHENSIVE METABOLIC PANEL
ALT: 12 U/L (ref 0–35)
AST: 17 U/L (ref 0–37)
Alkaline Phosphatase: 81 U/L (ref 39–117)
CO2: 30 mEq/L (ref 19–32)
Calcium: 8.8 mg/dL (ref 8.4–10.5)
Chloride: 106 mEq/L (ref 96–112)
GFR calc Af Amer: >60 mL/min (ref >60)
GFR calc non Af Amer: >60 mL/min (ref >60)
Glucose, Bld: 147 mg/dL — ABNORMAL HIGH (ref 70–99)
Sodium: 141 mEq/L (ref 135–145)
Total Bilirubin: 0.7 mg/dL (ref 0.3–1.2)

## 2011-03-04 LAB — DIFFERENTIAL
Lymphs Abs: 2 10*3/uL (ref 0.7–4.0)
Monocytes Relative: 10 % (ref 3–12)
Neutro Abs: 2.7 10*3/uL (ref 1.7–7.7)
Neutrophils Relative %: 50 % (ref 43–77)

## 2011-03-04 LAB — URINALYSIS, ROUTINE W REFLEX MICROSCOPIC
Glucose, UA: NEGATIVE mg/dL
Hgb urine dipstick: NEGATIVE
Specific Gravity, Urine: 1.013 (ref 1.005–1.030)
pH: 6 (ref 5.0–8.0)

## 2011-03-04 LAB — PROTIME-INR: Prothrombin Time: 13.7 seconds (ref 11.6–15.2)

## 2011-03-04 LAB — TROPONIN I: Troponin I: 0.01 ng/mL (ref 0.00–0.06)

## 2011-03-04 LAB — BASIC METABOLIC PANEL
BUN: 12 mg/dL (ref 6–23)
CO2: 29 mEq/L (ref 19–32)
Calcium: 9.1 mg/dL (ref 8.4–10.5)
Creatinine, Ser: 0.75 mg/dL (ref 0.4–1.2)
GFR calc Af Amer: >60 mL/min (ref >60)

## 2011-03-04 LAB — URINE MICROSCOPIC-ADD ON

## 2011-03-04 LAB — CK TOTAL AND CKMB (NOT AT ARMC): Relative Index: INVALID (ref 0.0–2.5)

## 2011-03-05 ENCOUNTER — Inpatient Hospital Stay (HOSPITAL_COMMUNITY): Payer: BC Managed Care – PPO

## 2011-03-05 DIAGNOSIS — I517 Cardiomegaly: Secondary | ICD-10-CM

## 2011-03-05 DIAGNOSIS — I6350 Cerebral infarction due to unspecified occlusion or stenosis of unspecified cerebral artery: Secondary | ICD-10-CM

## 2011-03-05 LAB — GLUCOSE, CAPILLARY: Glucose-Capillary: 104 mg/dL — ABNORMAL HIGH (ref 70–99)

## 2011-03-05 LAB — LIPID PANEL
Cholesterol: 153 mg/dL (ref 0–200)
LDL Cholesterol: 79 mg/dL (ref 0–99)
Triglycerides: 47 mg/dL (ref <150)
VLDL: 9 mg/dL (ref 0–40)

## 2011-03-05 MED ORDER — IOHEXOL 300 MG/ML  SOLN
50.0000 mL | Freq: Once | INTRAMUSCULAR | Status: AC | PRN
  Administered 2011-03-05: 50 mL via INTRAVENOUS

## 2011-03-05 NOTE — H&P (Addendum)
NAME:  [name] [name], [name] [name]               ACCOUNT NO.:  [id number]  MEDICAL RECORD NO.:  [id number]           PATIENT TYPE:  E  LOCATION:  [facility]                         FACILITY:  [facility]  PHYSICIAN:  Vania Rea, M.D. DATE OF BIRTH:  [birth year]  DATE OF ADMISSION:  03/04/2011 DATE OF DISCHARGE:                             HISTORY & PHYSICAL   PRIMARY CARE PROVIDER:  Corwin Levins, MD  The patient is being admitted to [facility].  CHIEF COMPLAINT:  Right-sided weakness.  HISTORY OF PRESENT ILLNESS:  [name] [name] is a very pleasant 60 year old female with a history of hypertension and hyperlipidemia, who presents to the [facility] ED with a chief complaint of right-sided weakness. Information is obtained from the patient.  She states that while she was getting ready for work this morning, her right leg gave away and she fell on to the floor.  She denies any loss of consciousness, any dizziness, any visual disturbances.  She indicates that she was unable to get up off the floor secondary to right arm and right leg weakness. Associated symptoms do include some numbness of that right arm and leg as well as pain in her right shoulder.  At that time, she states she did not have a headache, visual disturbances, or slurred speech.  She indicates that she lay on the floor for a minute or two and dragged herself onto the bed.  She indicates that she laid there for about 10 minutes and the symptoms completely resolved, so she decided to get dressed and go to work.  At work, she developed a headache and shared with her coworkers her experience of the morning and decided to come to the hospital for evaluation.  In addition, she does report that she had one episode of slurred speech last week as well as one episode of right hand weakness specifically she was unable to grasp a coffee mug with her right hand.  Both of these episodes lasted less than 1 minute.  She indicates that  otherwise she has been in her usual state of health. Symptoms came on suddenly, have resolved.  Workup in the emergency room included an MRI/MRA of the brain, which yields multiple punctuate/lacunar infarcts.  We are asked to admit for further evaluation and treatment.  ALLERGIES:  PENICILLIN.  PAST MEDICAL HISTORY: 1. Hyperlipidemia. 2. Hypothyroidism. 3. Osteoporosis. 4. Hypertension. 5. Asthma.  PAST SURGICAL HISTORY:  Left breast biopsy in May 2008.  FAMILY MEDICAL HISTORY:  Father deceased in his 60s from coronary artery disease.  He also had high cholesterol.  SOCIAL HISTORY:  The patient lives alone.  She is employed full-time as a Merchandiser, retail with [employer]  She denies tobacco use. Denies EtOH.  Denies drug use.  MEDICATIONS: 1. Calcium chloride. 2. Lisinopril. 3. Potassium chloride.  Pharmacy to reconcile medications.  REVIEW OF SYSTEMS:  GENERAL:  Negative for fever, chills, anorexia, unintentional weight loss.  ENT:  Negative for ear pain, nasal congestion, sore throat.  CV:  Negative for chest pain, palpitation, lower extremity edema.  RESPIRATORY:  Negative for shortness of breath or cough.  MUSCULOSKELETAL:  See HPI.  NEURO:  See HPI.  GI:  Negative for abdominal pain, nausea, vomiting, diarrhea, constipation, or melena. GU:  Negative for dysuria, hematuria, frequency, or urgency.  PSYCH: Negative for depression and anxiety.  HEME:  Negative for any unusual bruising or bleeding.  LABORATORY DATA:  Sodium 139, potassium 3.7, chloride 102, CO2 of 29, BUN 12, creatinine 0.75, glucose 103.  WBCs 5.5, hemoglobin 14.3, hematocrit 41.6, platelets 194.  RADIOLOGY: 1. CT of the brain is negative. 2. MRI of the head without contrast yields multiple punctuate/lacunar     infarcts in the left MCA territory, appear to be acute or subacute.     This raises the possibility of a left ICA embolic phenomena.  No     mass effect or bleed.  Underlying  mild-to-moderate for age small-     vessel ischemia in the brainstem and deep gray matter nuclei. 3. MRA of the head without contrast yields extensive ICA siphon     atherosclerosis without hemodynamically significant stenosis.  Mild     irregularity of the left MCA MI segment without significant     stenosis or major left MCA branch occlusion.  2-mm infundibulum or     less likely tiny saccular aneurysm of the distal right ICA.     Negative posterior circulation with dominant right vertebral     artery.  PHYSICAL EXAM:  VITAL SIGNS:  Temperature 98.0, blood pressure 149/88, heart rate 67, respiration 16, sats 100% on room air. GENERAL:  Awake, alert, well-nourished, well-hydrated, in no acute distress. HEENT:  Head:  Normocephalic, atraumatic.  Pupils equal, round, and reactive to light.  EOMI.  Mucous membranes are moist and pink.  No obvious lesion or exudate in nose or ears. NECK:  Supple.  No JVD.  Full range of motion.  No lymphadenopathy. CV:  Regular rate and rhythm.  No murmur, gallop, or rub.  No lower extremity edema.  Pedal pulses present, palpable. ABDOMEN:  Round, soft, positive bowel sounds throughout, nontender to palpation.  No mass or organomegaly noted. NEURO:  Alert and oriented x3.  Speech clear.  Facial symmetry.  Upper extremity strength with grip 4/5 on right, 5/5 on left.  Lower extremity strength 4/5 on right, 5/5 on left. MUSCULOSKELETAL:  Moves all extremities.  No joint swelling/erythema. Full range of motion. EXTREMITIES:  Without clubbing or cyanosis.  ASSESSMENT/PLAN: 1. Stroke per MRI, multiple infarcts in the left MCA territory.  We     will admit to 3000 telemetry.  We will keep n.p.o. until swallow     evaluation past.  We will do frequent neuro checks.  Monitor her     CBGs.  We will get a 2-D echo and carotid Doppler.  We will provide     aspirin.  We will monitor closely. 2. Hypertension.  Blood pressure is currently 148/88.  The patient      takes lisinopril at home.  We will hold for now secondary to     stroke. 3. Hyperlipidemia.  We will check fasting lipid panel.  The patient     not currently on a statin. 4. History of asthma, currently at baseline.  We will monitor. 5. Deep venous thrombosis prophylaxis, we will use SCDs. 6. Code status.  The patient is a full code.  This assessment and plan was discussed with Dr. Orvan Falconer.  It was truly a pleasure taking care of [name] [name].     Gwenyth Bender, NP   ______________________________ Vania Rea, M.D.  KMB/MEDQ  D:  03/04/2011  T:  03/04/2011  Job:  [id number]  cc:   Corwin Levins, MD  Electronically Signed by Toya Smothers  on 03/05/2011 10:42:36 AM Electronically Signed by Vania Rea M.D. on 03/06/2011 01:59:19 AM

## 2011-03-06 LAB — CBC
HCT: 37.9 % (ref 36.0–46.0)
Hemoglobin: 12.7 g/dL (ref 12.0–15.0)
MCV: 86.9 fL (ref 78.0–100.0)
RBC: 4.36 MIL/uL (ref 3.87–5.11)
RDW: 13.6 % (ref 11.5–15.5)
WBC: 7.1 10*3/uL (ref 4.0–10.5)

## 2011-03-06 LAB — GLUCOSE, CAPILLARY
Glucose-Capillary: 100 mg/dL — ABNORMAL HIGH (ref 70–99)
Glucose-Capillary: 94 mg/dL (ref 70–99)

## 2011-03-06 LAB — BASIC METABOLIC PANEL
Chloride: 104 mEq/L (ref 96–112)
GFR calc non Af Amer: >60 mL/min (ref >60)
Glucose, Bld: 100 mg/dL — ABNORMAL HIGH (ref 70–99)
Potassium: 3.5 mEq/L (ref 3.5–5.1)
Sodium: 140 mEq/L (ref 135–145)

## 2011-03-06 NOTE — Telephone Encounter (Signed)
Patient called with elevated BP, slurred speech. Informed patient would need to go to the ER. Patient agreed to do so.

## 2011-03-07 LAB — BASIC METABOLIC PANEL
BUN: 16 mg/dL (ref 6–23)
Calcium: 8.8 mg/dL (ref 8.4–10.5)
Creatinine, Ser: 0.8 mg/dL (ref 0.4–1.2)
GFR calc non Af Amer: >60 mL/min (ref >60)
Glucose, Bld: 104 mg/dL — ABNORMAL HIGH (ref 70–99)

## 2011-03-07 LAB — MAGNESIUM: Magnesium: 2.5 mg/dL (ref 1.5–2.5)

## 2011-03-08 ENCOUNTER — Inpatient Hospital Stay (HOSPITAL_COMMUNITY): Payer: BC Managed Care – PPO

## 2011-03-08 LAB — CBC
HCT: 37.5 % (ref 36.0–46.0)
Hemoglobin: 12.7 g/dL (ref 12.0–15.0)
MCHC: 33.9 g/dL (ref 30.0–36.0)
RDW: 14.1 % (ref 11.5–15.5)
WBC: 6.3 10*3/uL (ref 4.0–10.5)

## 2011-03-08 LAB — COMPREHENSIVE METABOLIC PANEL
ALT: 13 U/L (ref 0–35)
AST: 21 U/L (ref 0–37)
Alkaline Phosphatase: 77 U/L (ref 39–117)
CO2: 25 mEq/L (ref 19–32)
Calcium: 9.1 mg/dL (ref 8.4–10.5)
GFR calc Af Amer: >60 mL/min (ref >60)
Glucose, Bld: 98 mg/dL (ref 70–99)
Potassium: 3.7 mEq/L (ref 3.5–5.1)
Sodium: 139 mEq/L (ref 135–145)
Total Protein: 6.4 g/dL (ref 6.0–8.3)

## 2011-03-08 LAB — GLUCOSE, CAPILLARY
Glucose-Capillary: 90 mg/dL (ref 70–99)
Glucose-Capillary: 104 mg/dL — ABNORMAL HIGH (ref 70–99)

## 2011-03-09 ENCOUNTER — Other Ambulatory Visit: Payer: Self-pay | Admitting: Vascular Surgery

## 2011-03-09 DIAGNOSIS — I6529 Occlusion and stenosis of unspecified carotid artery: Secondary | ICD-10-CM

## 2011-03-09 LAB — GLUCOSE, CAPILLARY

## 2011-03-09 LAB — SURGICAL PCR SCREEN
MRSA, PCR: NEGATIVE
Staphylococcus aureus: NEGATIVE

## 2011-03-10 LAB — BASIC METABOLIC PANEL
Chloride: 103 mEq/L (ref 96–112)
GFR calc Af Amer: >60 mL/min (ref >60)
GFR calc non Af Amer: >60 mL/min (ref >60)
Potassium: 3.9 mEq/L (ref 3.5–5.1)
Sodium: 135 mEq/L (ref 135–145)

## 2011-03-10 LAB — CBC
Platelets: 193 10*3/uL (ref 150–400)
RBC: 3.95 MIL/uL (ref 3.87–5.11)
RDW: 14.2 % (ref 11.5–15.5)
WBC: 15.2 10*3/uL — ABNORMAL HIGH (ref 4.0–10.5)

## 2011-03-11 LAB — GLUCOSE, CAPILLARY
Glucose-Capillary: 103 mg/dL — ABNORMAL HIGH (ref 70–99)
Glucose-Capillary: 145 mg/dL — ABNORMAL HIGH (ref 70–99)

## 2011-03-12 NOTE — Op Note (Signed)
NAME:  [name] [name], [name] [name]               ACCOUNT NO.:  [id number]  MEDICAL RECORD NO.:  [id number]           PATIENT TYPE:  I  LOCATION:  3311                         FACILITY:  [facility]  PHYSICIAN:  Janetta Hora. Fields, MD  DATE OF BIRTH:  [birth year]  DATE OF PROCEDURE:  03/09/2011 DATE OF DISCHARGE:                              OPERATIVE REPORT   PROCEDURE:  Left carotid endarterectomy.  PREOPERATIVE DIAGNOSIS:  Symptomatic left internal carotid artery stenosis.  POSTOPERATIVE DIAGNOSIS:  Symptomatic left internal carotid artery stenosis.  ANESTHESIA:  General.  ASSISTANT:  Della Goo, PA-C  OPERATIVE FINDINGS: 1. 10-French shunt. 2. High carotid bifurcation. 3. Dacron patch. 4. Greater than 80% left internal carotid artery stenosis.  SPECIMENS:  Left carotid plaque.  OPERATIVE DETAILS:  After obtaining informed consent, the patient was taken to the operating room.  The patient was placed in supine position on the operating table.  After induction of general anesthesia and endotracheal intubation, the patient's entire left neck and chest were prepped and draped in usual sterile fashion.  An oblique incision was made on the left side of the neck, carried down through the subcutaneous tissues and platysma, and the sternocleidomastoid muscle was identified. This was retracted laterally.  The common facial vein was identified. This was dissected free circumferentially and ligated and divided between silk ties.  The jugular vein was then retracted lateral and the common carotid artery was identified at the base of the incision. Common carotid artery was dissected free circumferentially.  The vagus nerve was identified and protected.  Dissection was then carried up to the level of carotid bifurcation.  It was found at this point that there was a fairly high carotid bifurcation, which had also been suggested by her preoperative CT angiogram.  Dissection was carried up to the  level of the hypoglossal nerve and this was mobilized by dividing several small blood vessel branches that were tethering it as well as dividing the ansa cervicalis.  A vessel loop was placed around the hypoglossal nerve and there was some traction gently placed on this.  The distal internal carotid artery was then dissected free circumferentially above the level of stenosis.  The external carotids and superior thyroid arteries were dissected free circumferentially and vessel loops were placed around these.  The patient's mean arterial pressure was then raised up between 85 and 90 mmHg.  The distal internal carotid artery was controlled with a fine bulldog clamp.  The external carotid and superior thyroid arteries were controlled with vessel loops.  The common carotid artery was controlled with peripheral DeBakey clamp.  A longitudinal opening was made in the common carotid artery just below the carotid bifurcation.  The arteriotomy was extended longitudinally up through the carotid bifurcation and into the distal internal carotid artery.  The stenosis was calcified and greater than 80%.  There was a friable debris but no obvious thrombus.  The arteriotomy was extended past the level of disease and the 10-French shunt was brought up in the operative field, threaded into the distal internal carotid artery, and allowed to back bleed thoroughly.  There was pulsatile bleeding from  the shunt.  Shunt was then threaded down the common carotid artery and secured with a Rumel tourniquet.  Shunt was inspected, found to be free with air, and unclamped with restoration of flow after approximately 5 minutes.  Next, an endarterectomy was done in a suitable plane near the carotid bifurcation.  The external carotid artery was endarterectomized by eversion technique.  A good distal endpoint was obtained in the internal carotid artery.  All loose debris was removed from the carotid bed.  The carotid  plaque was passed off as specimen.  Next, a Dacron patch was brought up in the operative field and sewn on as patch angioplasty using a running 6-0 Prolene suture.  Just prior to completion of the patch angioplasty, the shunt was re-occluded and brought down on the distal internal carotid artery and this was allowed to back bleed thoroughly. Shunt was then removed from the proximal common carotid artery, and this was resecured with a peripheral DeBakey clamp.  The external carotid artery was thoroughly back bled and everything again was thoroughly irrigated with heparinized saline.  The remainder of the patch was then completed and inflow was first restored retrograde from the external carotid artery and then antegrade from the common carotid artery to the external carotid artery and then finally after approximately 5 cardiac cycles to the internal carotid artery.  Doppler was then used to examine the internal, external, and common carotid arteries and these all had good Doppler flow.  Hemostasis was obtained with the assistance of 70 mg of protamine.  The patient had been given 7000 units of heparin prior to clamping the internal carotid artery.  After hemostasis was obtained, the platysma muscle was reapproximated using running 3-0 Vicryl suture. Skin was closed with 4-0 Vicryl subcuticular stitch.  The patient tolerated the procedure well, and there were no complications. Instrument, sponge, and needle count was correct at the end of the case. The patient was following commands and moving her upper extremities and lower extremities symmetrically at the end of the case and was taken to the recovery room in stable condition.  Instrument, sponge, and needle count was correct at the end of the case.     Janetta Hora. Fields, MD     CEF/MEDQ  D:  03/09/2011  T:  03/10/2011  Job:  [id number]  Electronically Signed by Fabienne Bruns MD on 03/12/2011 10:56:14 AM

## 2011-03-12 NOTE — Consult Note (Signed)
NAME:  [name] [name]               ACCOUNT NO.:  [id number]  MEDICAL RECORD NO.:  [id number]           PATIENT TYPE:  I  LOCATION:  3009                         FACILITY:  [facility]  PHYSICIAN:  Janetta Hora. Fields, MD  DATE OF BIRTH:  02-14-1951  DATE OF CONSULTATION:  03/06/2011 DATE OF DISCHARGE:                                CONSULTATION   Requesting [facility].  REASON FOR CONSULTATION:  Symptomatic left carotid stenosis.  HISTORY OF PRESENT ILLNESS:  The patient is a 60 year old female who was admitted on March 04, 2011, with symptoms of right upper extremity and right lower extremity weakness.  These symptoms resolved over a period of few hours.  She still has some mild right hand clumsiness, but otherwise has baseline returned to normal neurologic function. Atherosclerotic risk factors include hyperlipidemia and hypertension. She also has a remote history of smoking, but quit approximately 20 years ago.  She smoked for 10 years prior to this.  She has had extensive workup while in the hospital for her stroke.  This included a CT scan of the head, which I reviewed the films on from March 04, 2011, this showed no evidence of intracranial bleed.  She had an MRI of the brain on March 04, 2011, which showed evidence of multiple punctate lacunar infarcts in the left frontal lobe MCA territory.  She then also had a CT angiogram of the neck which showed a greater than 75% left internal carotid artery stenosis and a 50% right internal carotid artery stenosis.  She had a diminutive left vertebral artery and a large right vertebral artery.  The patient has continued to improve and has essentially reached a baseline now with again mild right hand clumsiness.  She states that she may have had a few of these events prior to this most recent event, but states that she was in denial about these.  PAST MEDICAL HISTORY:  Otherwise, remarkable for  hypothyroidism, osteoporosis, and asthma.  PAST SURGICAL HISTORY:  She had a C-section and a left breast biopsy.  FAMILY HISTORY:  She has multiple relatives with history of diabetes. Her father died in his 28s from coronary artery disease and also had elevated cholesterol.  SOCIAL HISTORY:  The patient lives alone.  She has several children in the area.  She works full-time in the [employer]. She denies tobacco, alcohol or drug use currently.  MEDICATIONS:  Include 1. Aspirin 325 mg once a day. 2. Simvastatin 40 mg once a day.  REVIEW OF SYSTEMS:  Unchanged from her admission history and physical dated March 04, 2011.  Please see her admission history and physical for details regarding this.  PHYSICAL EXAMINATION:  VITAL SIGNS:  Temperature is 98, heart rate 77, blood pressure is 125/80, respirations 22, oxygen saturations 96% on room air. HEENT is unremarkable. NECK:  Has 2+ carotid pulses, without bruit. CHEST:  Clear to auscultation with no wheeze. CARDIAC:  Regular rate and rhythm without murmur. ABDOMEN:  Soft, nontender, nondistended.  No masses. NEUROLOGIC:  She is alert, oriented x3.  She has no facial asymmetry. She has no  slurred speech.  Tongue is midline. EXTREMITIES:  Upper extremity strength is 4/5 on arm extension with some mild clumsiness of the right hand, left upper extremity is 5/5, right lower extremity is also slightly clumsy compared to the left. MUSCULOSKELETAL:  She has no major obvious joint deformities. SKIN:  Has no open ulcers or rashes.  LABORATORY DATA:  Creatinines 0.89, hemoglobin 12.7, white blood cell count 7.1, platelet count 188, total cholesterol 153.  Hemoglobin A1c was at the upper limits of normal at 6.1.  Other cardiac enzymes were negative.  ASSESSMENT:  Symptomatic high-grade left internal carotid artery stenosis.  PLAN:  Left carotid endarterectomy on Monday, March 09, 2011.  Risks, benefits, possible  complications and procedure details were explained to the patient today including, but not limited to bleeding, infection, stroke risk of 1-2%, cranial nerve injury risk of approximately 10%, if she was noted to have a fairly high carotid bifurcation on her CT scan, and I informed her that this may require extensive dissection around some of her upper cranial nerves.  She understands and agrees to proceed.  She will continue her aspirin during her hospital stay.  She will stay over the weekend for her operation on Monday.     Janetta Hora. Fields, MD     CEF/MEDQ  D:  03/06/2011  T:  03/07/2011  Job:  [id number]  Electronically Signed by Fabienne Bruns MD on 03/12/2011 10:56:12 AM

## 2011-03-13 ENCOUNTER — Encounter: Payer: Self-pay | Admitting: Internal Medicine

## 2011-03-13 ENCOUNTER — Ambulatory Visit (INDEPENDENT_AMBULATORY_CARE_PROVIDER_SITE_OTHER): Payer: BC Managed Care – PPO | Admitting: Internal Medicine

## 2011-03-13 VITALS — BP 162/110 | HR 110 | Temp 100.0°F | Ht 59.0 in | Wt 138.1 lb

## 2011-03-13 DIAGNOSIS — J329 Chronic sinusitis, unspecified: Secondary | ICD-10-CM

## 2011-03-13 DIAGNOSIS — I639 Cerebral infarction, unspecified: Secondary | ICD-10-CM

## 2011-03-13 DIAGNOSIS — I6350 Cerebral infarction due to unspecified occlusion or stenosis of unspecified cerebral artery: Secondary | ICD-10-CM

## 2011-03-13 DIAGNOSIS — I10 Essential (primary) hypertension: Secondary | ICD-10-CM

## 2011-03-13 MED ORDER — AZITHROMYCIN 250 MG PO TABS: ORAL_TABLET | ORAL | Status: AC

## 2011-03-13 MED ORDER — AMLODIPINE BESYLATE 2.5 MG PO TABS: 2.5000 mg | ORAL_TABLET | Freq: Every day | ORAL | Status: DC

## 2011-03-13 NOTE — Patient Instructions (Signed)
Take all new medications as prescribed - the antibiotic Start the amlodipine 2.5 mg per day for blood pressure Zella Ball will check to see if singulair is available in sample The current medical regimen is effective;  continue present plan and medications.  You are given the work note for being off work Mar 04, 2011 through Apr 02, 2011 Please keep your appointment with surgury Apr 19 as planned

## 2011-03-15 ENCOUNTER — Encounter: Payer: Self-pay | Admitting: Internal Medicine

## 2011-03-15 DIAGNOSIS — I639 Cerebral infarction, unspecified: Secondary | ICD-10-CM | POA: Insufficient documentation

## 2011-03-15 NOTE — Progress Notes (Signed)
Subjective:    Patient ID: [name] [name], female    DOB: 1951/04/21, 60 y.o.   MRN: [id number]  HPI  Here post hospn for acute CVA characterized by acute onset HA and right side weakness and fall, after episode prior of slurred speech that may have been a TIA.  MRI showed mult lacunar infarct in the left MCA territory.  Echo and carotids ordered, though I dont have results which led to vascular consult and now s/p left CEA. Did have slight elevated blood sugar and very mild anemia as well.  Post d/c pt has been doing very well though some discomfort persits at the surgical site without redness, increased tender , swelling or drainage.  No new neuro symptoms post d/c,  Pt denies chest pain, increased sob or doe, wheezing, orthopnea, PND, increased LE swelling, palpitations, dizziness or syncope.   Pt denies new neurological symptoms such as new headache, or facial or extremity weakness or numbness.   Pt denies polydipsia, polyuria, or low sugar symptoms such as weakness or confusion improved with po intake.  Pt states overall good compliance with meds, trying to follow lower cholesterol diet, wt overall stable but little exercise however. Overall good compliance with treatment, and good medicine tolerability.  Pt denies  wt loss, night sweats, loss of appetite, or other constitutional symptoms  Denies worsening depressive symptoms, suicidal ideation, or panic, though has ongoing anxiety, not increased recently. Incidently  Here with 3 days acute onset fever, facial pain, pressure, general weakness and malaise, and greenish d/c, with slight ST, but little to no cough.   Past Medical History  Diagnosis Date  . HYPOTHYROIDISM 11/06/2007  . HYPERLIPIDEMIA 11/03/2010  . HYPERTENSION 09/13/2007  . ALLERGIC RHINITIS 11/06/2007  . ASTHMA 09/13/2007  . PERIMENOPAUSAL STATUS 09/13/2007  . OSTEOPOROSIS 11/06/2007  . ALCOHOL ABUSE, HX OF 11/06/2007  . COLONIC POLYPS, HX OF 02/09/2008  . INSOMNIA, HX OF 09/13/2007     Past Surgical History  Procedure Date  . Cesarean section     x 3  . S/p breast cancer surgury 04/2007    benign    reports that she has never smoked. She does not have any smokeless tobacco history on file. She reports that she does not drink alcohol or use illicit drugs. family history includes Coronary artery disease in her other; Hyperlipidemia in her other; and Hypertension in her other. Allergies  Allergen Reactions  . Alendronate Sodium (Fosamax)   . Penicillins    Current Outpatient Prescriptions on File Prior to Visit  Medication Sig Dispense Refill  . albuterol (PROAIR HFA) 108 (90 BASE) MCG/ACT inhaler Inhale 2 puffs into the lungs every 4 (four) hours as needed.        Marland Kitchen atorvastatin (LIPITOR) 20 MG tablet Take 20 mg by mouth daily.        . Fluticasone-Salmeterol (ADVAIR DISKUS) 250-50 MCG/DOSE AEPB Inhale 1 puff into the lungs 2 (two) times daily.        . montelukast (SINGULAIR) 10 MG tablet Take 10 mg by mouth daily.        . potassium chloride (KLOR-CON 10) 10 MEQ CR tablet Take 10 mEq by mouth daily.        Marland Kitchen zolpidem (AMBIEN) 10 MG tablet Take 10 mg by mouth daily as needed.        Marland Kitchen lisinopril-hydrochlorothiazide (PRINZIDE,ZESTORETIC) 20-12.5 MG per tablet Take 1 tablet by mouth 2 (two) times daily.           Review  of Systems Review of Systems  Constitutional: Negative for diaphoresis and unexpected weight change.  HENT: Negative for drooling and tinnitus.   Eyes: Negative for photophobia and visual disturbance.  Respiratory: Negative for choking and stridor.   Gastrointestinal: Negative for vomiting and blood in stool.  Genitourinary: Negative for hematuria and decreased urine volume.  Musculoskeletal: Negative for gait problem.  Skin: Negative for color change and wound.  Neurological: Negative for tremors and numbness.  Psychiatric/Behavioral: Negative for decreased concentration. The patient is not hyperactive.       Objective:   Physical Exam BP  162/110  Pulse 110  Temp(Src) 100 F (37.8 C) (Oral)  Ht 4\' 11"  (1.499 m)  Wt 138 lb 2 oz (62.653 kg)  BMI 27.90 kg/m2  SpO2 95%  Physical Exam  VS noted Constitutional: Pt appears well-developed and well-nourished. , mild ill HENT: Head: Normocephalic.  Right Ear: External ear normal.  Bilat tm;s mild erythema Left Ear: External ear normal.  Sinus tender bilat. Eyes: Conjunctivae and EOM are normal. Pupils are equal, round, and reactive to light.  Neck: Normal range of motion. Neck supple.  Cardiovascular: Normal rate and regular rhythm.   Pulmonary/Chest: Effort normal and breath sounds normal.  Abd:  Soft, NT, non-distended, + BS Neurological: Pt is alert. No cranial nerve deficit. Motor intact, gait no change. Skin: Skin is warm. No erythema.  Psychiatric: Pt behavior is normal. Thought content normal.          Assessment & Plan:

## 2011-03-15 NOTE — Assessment & Plan Note (Signed)
Acute, tx with antibx course, f/u any worsening symptoms.

## 2011-03-15 NOTE — Assessment & Plan Note (Signed)
stable overall by hx and exam, most recent lab reviewed with pt, and pt to continue medical treatment as before , most recent lasbs from echart last admi reviewed with pt and   Lab Results  Component Value Date   WBC 15.2* 03/10/2011   HGB 11.8* 03/10/2011   HCT 35.0* 03/10/2011   PLT 193 03/10/2011   CHOL  Value: 153        ATP III CLASSIFICATION:  <200     mg/dL   Desirable  782-956  mg/dL   Borderline High  >=213    mg/dL   High        0/86/5784   TRIG 47 03/05/2011   HDL 65 03/05/2011   LDLDIRECT 127.1 10/24/2010   ALT 13 03/08/2011   AST 21 03/08/2011   NA 135 03/10/2011   K 3.9 03/10/2011   CL 103 03/10/2011   CREATININE 0.71 03/10/2011   BUN 9 03/10/2011   CO2 28 03/10/2011   TSH 1.44 10/24/2010   INR 1.03 03/04/2011   HGBA1C  Value: 6.1 (NOTE)                                                                       According to the ADA Clinical Practice Recommendations for 2011, when HbA1c is used as a screening test:   >=6.5%   Diagnostic of Diabetes Mellitus           (if abnormal result  is confirmed)  5.7-6.4%   Increased risk of developing Diabetes Mellitus  References:Diagnosis and Classification of Diabetes Mellitus,Diabetes Care,2011,34(Suppl 1):S62-S69 and Standards of Medical Care in         Diabetes - 2011,Diabetes Care,2011,34  (Suppl 1):S11-S61.* 03/04/2011

## 2011-03-15 NOTE — Assessment & Plan Note (Signed)
Uncontrolled, to add amlodipine 2.5 mg per day  BP Readings from Last 3 Encounters:  03/13/11 162/110  11/03/10 142/82  11/01/09 124/82

## 2011-03-24 DIAGNOSIS — Z0279 Encounter for issue of other medical certificate: Secondary | ICD-10-CM

## 2011-04-02 ENCOUNTER — Ambulatory Visit (INDEPENDENT_AMBULATORY_CARE_PROVIDER_SITE_OTHER): Payer: BC Managed Care – PPO | Admitting: Vascular Surgery

## 2011-04-02 DIAGNOSIS — I6529 Occlusion and stenosis of unspecified carotid artery: Secondary | ICD-10-CM

## 2011-04-03 ENCOUNTER — Encounter: Payer: Self-pay | Admitting: Internal Medicine

## 2011-04-03 ENCOUNTER — Ambulatory Visit (INDEPENDENT_AMBULATORY_CARE_PROVIDER_SITE_OTHER): Payer: BC Managed Care – PPO | Admitting: Internal Medicine

## 2011-04-03 VITALS — BP 114/86 | HR 83 | Temp 98.8°F | Ht 59.0 in | Wt 144.0 lb

## 2011-04-03 DIAGNOSIS — I639 Cerebral infarction, unspecified: Secondary | ICD-10-CM

## 2011-04-03 DIAGNOSIS — Z0000 Encounter for general adult medical examination without abnormal findings: Secondary | ICD-10-CM

## 2011-04-03 DIAGNOSIS — I6350 Cerebral infarction due to unspecified occlusion or stenosis of unspecified cerebral artery: Secondary | ICD-10-CM

## 2011-04-03 DIAGNOSIS — F419 Anxiety disorder, unspecified: Secondary | ICD-10-CM | POA: Insufficient documentation

## 2011-04-03 DIAGNOSIS — F411 Generalized anxiety disorder: Secondary | ICD-10-CM

## 2011-04-03 DIAGNOSIS — E785 Hyperlipidemia, unspecified: Secondary | ICD-10-CM

## 2011-04-03 DIAGNOSIS — I10 Essential (primary) hypertension: Secondary | ICD-10-CM

## 2011-04-03 HISTORY — DX: Encounter for general adult medical examination without abnormal findings: Z00.00

## 2011-04-03 HISTORY — DX: Anxiety disorder, unspecified: F41.9

## 2011-04-03 MED ORDER — LISINOPRIL 20 MG PO TABS: 20.0000 mg | ORAL_TABLET | Freq: Every day | ORAL | Status: DC

## 2011-04-03 MED ORDER — ZOLPIDEM TARTRATE 10 MG PO TABS: 10.0000 mg | ORAL_TABLET | Freq: Every day | ORAL | Status: DC | PRN

## 2011-04-03 MED ORDER — AMLODIPINE BESYLATE 2.5 MG PO TABS: 2.5000 mg | ORAL_TABLET | Freq: Every day | ORAL | Status: DC

## 2011-04-03 NOTE — Patient Instructions (Addendum)
Continue all other medications as before, except ok to stop the Potassium pill (the klor-con 10) since you dont take the fluid pill now Please check your BP every 2-3 days at home and call in 2 wks with the results If the results show higher BP, then we may need to increaes the amlodipine to 5 mg Please call if you change your mind about trying the lexapro 10 mg for nerves Please return in Nov 2012 with Lab testing done 3-5 days before

## 2011-04-03 NOTE — Assessment & Plan Note (Signed)
OFFICE VISIT  [name], [name] B DOB:  05-26-1951                                       04/02/2011 [id number]#:[id number]  The patient returns for followup today.  She underwent left carotid endarterectomy for symptomatic carotid stenosis on March 26.  She had had a stroke preoperatively.  She denies any symptoms of TIA or amaurosis or stroke currently.  She thinks that she has recovered well. She still has some residual numbness around her incision but otherwise is doing okay.  She denies any problems with swallowing.  PHYSICAL EXAM:  Blood pressure is 164/94 in the right arm, 163/104 in the left arm, heart rate 91, respirations 20. Neck:  Left neck incision is well-healed.  There is still some mild edema but overall is healing well.  Neurological:  Exam shows symmetric upper extremity and lower extremity motor strength which is 5/5.  I discussed with the patient today that her blood pressure was elevated. She stated that she forgot to take her blood pressure medication this morning and had apparently been recently started on an additional medication.  I emphasized to her that it is going to be very important to control her blood pressure and cholesterol in the long term to prevent restenosis or progression of stenosis on the right side.  Of note, she does have a known right-sided 50% carotid stenosis.  At this point I believe the patient is doing well.  Since she has had a recent stroke and operation I believe she needs a few more weeks off of work.  We have given her a note to return to work on May 7.  She will return for followup for repeat carotid duplex exam in 6 months' time. She will return sooner if she has any problems.  She will continue to take 1 aspirin daily.    Janetta Hora. Kashaun Bebo, MD Electronically Signed  CEF/MEDQ  D:  04/02/2011  T:  04/03/2011  Job:  [id number]  cc:   Corwin Levins, MD

## 2011-04-06 NOTE — Discharge Summary (Signed)
NAME:  [name] [name]               ACCOUNT NO.:  [id number]  MEDICAL RECORD NO.:  [id number]           PATIENT TYPE:  I  LOCATION:  3311                         FACILITY:  [facility]  PHYSICIAN:  [name] Hora. Darleth Eustache, MD  DATE OF BIRTH:  1951/06/19  DATE OF ADMISSION:  03/04/2011 DATE OF DISCHARGE:  03/10/2011                              DISCHARGE SUMMARY   ADMITTING DIAGNOSIS:  Right-sided weakness.  PAST MEDICAL HISTORY AND DISCHARGE DIAGNOSES: 1. Cerebrovascular accident, status post left carotid endarterectomy. 2. Hypertension. 3. Hyperlipidemia. 4. Hypothyroidism. 5. Osteoporosis. 6. Asthma. 7. Left breast biopsy, May 2008.  BRIEF HISTORY:  The patient is a 60 year old female who was admitted on March 04, 2011, by the Hospitalist Team with symptoms of right upper extremity and right lower extremity weakness.  The symptoms resolved over a period of few hours.  She had an extensive workup while in the hospital for her stroke.  This included a CT of the head and MRI of the brain and a CT angiogram of the neck.  This confirmed right CVA with a greater than 75% left internal carotid artery stenosis and 50% right internal carotid artery stenosis.  Secondary to these findings, Dr. Darrick [name] of the [name] was consulted on March 06, 2011.  After evaluation, he felt that the patient should undergo left carotid enterectomy for stroke risk reduction.  HOSPITAL COURSE:  The patient was admitted and worked up for right CVA as previously stated.  She was then evaluated by Dr. Darrick [name] on March 06, 2011, for carotid artery stenosis.  The patient was taken to the OR on March 09, 2011 for left carotid enterectomy with Dacron patch angioplasty.  The patient tolerated the procedure well and was hemodynamically stable immediately postoperatively.  She was transferred from the OR to postanesthesia care unit in stable condition. The patient was extubated without complication and woke  up from anesthesia neurologically intact at her baseline.  The patient was transferred from the PACU to the floor without difficulty.  The patients postoperative course has progressed as expected.  She was awake and following commands.  She complained of a mild headache.  She was able to void, eat, and ambulate all without difficulty.  On March 10, 2011, postoperative day 1, she was afebrile with stable vital signs.  PHYSICAL EXAMINATION:  CARDIAC:  Regular rate and rhythm. LUNGS:  Clear to auscultation. ABDOMEN:  Benign. SKIN:  The incision is clean, dry, and intact with no evidence of hematoma. NEUROLOGIC:  She was neurologically at her baseline with 4/5 right upper and lower extremity strength.  Sensation was intact in all extremities. The left extremities were 4/5 in strength.  She had no tongue deviation.  The patient was doing well and was felt stable for discharge home at that time.  LABORATORY DATA:  CBC and BMP on March 10, 2011; white count 15.2, hemoglobin 11.8, hematocrit 35, platelets 193.  Sodium 135, potassium 3.9, BUN 9, creatinine 0.71.  DISCHARGE INSTRUCTIONS:  The patient received specific written discharge instructions regarding diet, activity, and wound care.  The patient was to follow up with Dr. Darrick [name] 2 weeks  after discharge.  The office will contact her with the date and time of that appointment.  MEDICATIONS: 1. Aspirin 325 mg daily. 2. Lisinopril 20 mg daily. 3. Percocet 5/325 mg 1-2 q.4-6 hours p.r.n. pain. 4. Advair Diskus 250/50 one puff inhaled b.i.d. 5. Aleve 220 mg 2 tablets b.i.d. p.r.n. 6. Ambien 10 mg at bedtime p.r.n. 7. Calcium carbonate OTC daily. 8. Lipitor 20 mg daily. 9. Potassium chloride 10 mEq daily. 10.Singulair 10 mg daily. 11.Tylenol 325 mg 2 tablets p.o. b.i.d. p.r.n.  Medications that have been stopped during hospitalization: Lisinopril/hydrochlorothiazide 20/12.5 mg 2 tablets p.o. daily.     [name] Leisure,  PA   ______________________________ [name] Hora Charlestine Rookstool, MD    AY/MEDQ  D:  03/19/2011  T:  03/20/2011  Job:  [id number]  Electronically Signed by [name] Leisure PA on 03/24/2011 09:54:52 AM Electronically Signed by Fabienne Bruns MD on 04/06/2011 11:05:52 AM

## 2011-04-07 ENCOUNTER — Encounter: Payer: Self-pay | Admitting: Internal Medicine

## 2011-04-07 ENCOUNTER — Other Ambulatory Visit: Payer: Self-pay | Admitting: Nurse Practitioner

## 2011-04-07 DIAGNOSIS — Z1231 Encounter for screening mammogram for malignant neoplasm of breast: Secondary | ICD-10-CM

## 2011-04-07 NOTE — Assessment & Plan Note (Signed)
stable overall by hx and exam, most recent lab reviewed with pt, and pt to continue medical treatment as before,  D/w pt need to control risk factors and prevention and she is quite motivated to cont to do so

## 2011-04-07 NOTE — Assessment & Plan Note (Signed)
D/w pt - has signficant social stressors, declines counseling or SSRI tx at this time, no worsening depressive symtpoms or suicidal ideation,  to f/u any worsening symptoms or concerns

## 2011-04-07 NOTE — Progress Notes (Signed)
Subjective:    Patient ID: [name] [name], female    DOB: [birth year], 60 y.o.   MRN: [id number]  HPI  Here to f/u after finding her BP 154.94 on apr 19, became concerned and made appt. BP most other days < 140/90, in fact more similar to today value.  Since her stroke she has been taking BP at home regularly;  Pt denies chest pain, increased sob or doe, wheezing, orthopnea, PND, increased LE swelling, palpitations, dizziness or syncope.  Pt denies new neurological symptoms such as new headache, or facial or extremity weakness or numbness.   Pt denies polydipsia, polyuria   Pt states overall good compliance with meds, trying to follow lower cholesterol  diet, wt overall stable but little exercise however and did stop the lis-hct recently to start the amlodipine 2.5.  Still taking the K supplement.  Denies worsening depressive symptoms, suicidal ideation, or panic, though has ongoing anxiety, and increased recently with mult social stressors in the past 2 months.  Is currenly not yet back to work as well per [employer] until early may.   Pt denies fever, wt loss, night sweats, loss of appetite, or other constitutional symptoms  No other complaints Past Medical History  Diagnosis Date  . HYPOTHYROIDISM 11/06/2007  . HYPERLIPIDEMIA 11/03/2010  . HYPERTENSION 09/13/2007  . ALLERGIC RHINITIS 11/06/2007  . ASTHMA 09/13/2007  . PERIMENOPAUSAL STATUS 09/13/2007  . OSTEOPOROSIS 11/06/2007  . ALCOHOL ABUSE, HX OF 11/06/2007  . COLONIC POLYPS, HX OF 02/09/2008  . INSOMNIA, HX OF 09/13/2007  . Anxiety 04/03/2011   Past Surgical History  Procedure Date  . Cesarean section     x 3  . S/p breast cancer surgury 04/2007    benign    reports that she has never smoked. She does not have any smokeless tobacco history on file. She reports that she does not drink alcohol or use illicit drugs. family history includes Coronary artery disease in her other; Hyperlipidemia in her other; and Hypertension in her  other. Allergies  Allergen Reactions  . Alendronate Sodium (Fosamax)   . Penicillins    Current Outpatient Prescriptions on File Prior to Visit  Medication Sig Dispense Refill  . albuterol (PROAIR HFA) 108 (90 BASE) MCG/ACT inhaler Inhale 2 puffs into the lungs every 4 (four) hours as needed.        Marland Kitchen aspirin 325 MG EC tablet Take 325 mg by mouth daily.        Marland Kitchen atorvastatin (LIPITOR) 20 MG tablet Take 20 mg by mouth daily.        . Calcium Carbonate-Vitamin D (CALCIUM-VITAMIN D) 500-200 MG-UNIT per tablet Take 1 tablet by mouth daily.        . Fluticasone-Salmeterol (ADVAIR DISKUS) 250-50 MCG/DOSE AEPB Inhale 1 puff into the lungs 2 (two) times daily.        . montelukast (SINGULAIR) 10 MG tablet Take 10 mg by mouth daily.        Marland Kitchen oxyCODONE-acetaminophen (PERCOCET) 5-325 MG per tablet Take 1 tablet by mouth. Take 1-2 tablets every 4 hours as needed        Review of Systems Review of Systems  Constitutional: Negative for diaphoresis and unexpected weight change.  HENT: Negative for drooling and tinnitus.   Eyes: Negative for photophobia and visual disturbance.  Respiratory: Negative for choking and stridor.   Gastrointestinal: Negative for vomiting and blood in stool.  Genitourinary: Negative for hematuria and decreased urine volume.  Musculoskeletal: Negative for gait problem.  Skin:  Negative for color change and wound.  Neurological: Negative for tremors and numbness.  Psychiatric/Behavioral: Negative for decreased concentration. The patient is not hyperactive.       Objective:   Physical Exam BP 114/86  Pulse 83  Temp(Src) 98.8 F (37.1 C) (Oral)  Ht 4\' 11"  (1.499 m)  Wt 144 lb (65.318 kg)  BMI 29.08 kg/m2  SpO2 96% Physical Exam  VS noted, mild obese Constitutional: Pt appears well-developed and well-nourished.  HENT: Head: Normocephalic.  Right Ear: External ear normal.  Left Ear: External ear normal.  Eyes: Conjunctivae and EOM are normal. Pupils are equal, round,  and reactive to light.  Neck: Normal range of motion. Neck supple.  Cardiovascular: Normal rate and regular rhythm.   Pulmonary/Chest: Effort normal and breath sounds normal.  Abd:  Soft, NT, non-distended, + BS Neurological: Pt is alert. No cranial nerve deficit.  Skin: Skin is warm. No erythema.  Psychiatric: Pt behavior is normal. Thought content normal. 2+ nervous        Assessment & Plan:

## 2011-04-07 NOTE — Assessment & Plan Note (Signed)
stable overall by hx and exam, most recent lab reviewed with pt, and pt to continue medical treatment as before  Lab Results  Component Value Date   [facility]  Value: 79        Total Cholesterol/HDL:CHD Risk Coronary Heart Disease Risk Table                     Men   Women  1/2 Average Risk   3.4   3.3  Average Risk       5.0   4.4  2 X Average Risk   9.6   7.1  3 X Average Risk  23.4   11.0        Use the calculated Patient Ratio above and the CHD Risk Table to determine the patient's CHD Risk.        ATP III CLASSIFICATION (LDL):  <100     mg/dL   Optimal  409-811  mg/dL   Near or Above                    Optimal  130-159  mg/dL   Borderline  914-782  mg/dL   High  >956     mg/dL   Very High 01/27/864   To check further approx 6-12 mo, cont diet

## 2011-04-07 NOTE — Assessment & Plan Note (Signed)
stable overall by hx and exam, most recent lab reviewed with pt, and pt to continue medical treatment as before; to check BP at home and call with further values, to consider increase the amlodipine to 5 mg, but overall seems controlled  BP Readings from Last 3 Encounters:  04/03/11 114/86  03/13/11 162/110  11/03/10 142/82

## 2011-04-10 ENCOUNTER — Telehealth: Payer: Self-pay

## 2011-04-10 MED ORDER — AMLODIPINE BESY-BENAZEPRIL HCL 5-40 MG PO CAPS: 1.0000 | ORAL_CAPSULE | Freq: Every day | ORAL | Status: DC

## 2011-04-10 NOTE — Telephone Encounter (Signed)
Pt called to report her BP readings as advised April 20 179/108 April 23 137/91 April 24 167/105 April 25 145/95 April 26 143-97 Today 179/90

## 2011-04-10 NOTE — Telephone Encounter (Signed)
Ok to stop the amlodipine 2.5 Ok to stop the lisiopril 20  Start the combination med:   Amlodipine 5/benazepril 40 mg - 1 per day  To robin to inform pt, I will do rx  Needs BMET in 1 wk after start - 401.1 (robin to do)

## 2011-04-13 NOTE — Telephone Encounter (Signed)
If no fever, ok for mucinex otc bid prn or allegra otc prn ,  But I would avoid anything with a decongestant such as sudafed

## 2011-04-13 NOTE — Telephone Encounter (Signed)
Called and informed patient of new medication sent to pharmacy. Patient will return to the lab the week of Apr 21, 2011. The patient has some sinus congestion and is requesting what do you recommend.

## 2011-04-13 NOTE — Telephone Encounter (Signed)
Called patient informed of instructions.

## 2011-04-14 ENCOUNTER — Other Ambulatory Visit (HOSPITAL_COMMUNITY)
Admission: RE | Admit: 2011-04-14 | Discharge: 2011-04-14 | Disposition: A | Discharge status: Home / Self Care | Payer: BC Managed Care – PPO | Source: Ambulatory Visit | Attending: Obstetrics and Gynecology | Admitting: Obstetrics and Gynecology

## 2011-04-14 ENCOUNTER — Ambulatory Visit: Payer: BC Managed Care – PPO

## 2011-04-14 ENCOUNTER — Ambulatory Visit
Admission: RE | Admit: 2011-04-14 | Discharge: 2011-04-14 | Disposition: A | Discharge status: Home / Self Care | Payer: BC Managed Care – PPO | Source: Ambulatory Visit | Attending: Nurse Practitioner | Admitting: Nurse Practitioner

## 2011-04-14 ENCOUNTER — Other Ambulatory Visit: Payer: Self-pay | Admitting: Nurse Practitioner

## 2011-04-14 DIAGNOSIS — Z1159 Encounter for screening for other viral diseases: Secondary | ICD-10-CM | POA: Insufficient documentation

## 2011-04-14 DIAGNOSIS — Z1231 Encounter for screening mammogram for malignant neoplasm of breast: Secondary | ICD-10-CM

## 2011-04-14 DIAGNOSIS — Z01419 Encounter for gynecological examination (general) (routine) without abnormal findings: Secondary | ICD-10-CM | POA: Insufficient documentation

## 2011-04-15 ENCOUNTER — Other Ambulatory Visit: Payer: Self-pay | Admitting: Nurse Practitioner

## 2011-04-15 DIAGNOSIS — N6452 Nipple discharge: Secondary | ICD-10-CM

## 2011-04-17 ENCOUNTER — Telehealth: Payer: Self-pay

## 2011-04-17 ENCOUNTER — Other Ambulatory Visit: Payer: Self-pay | Admitting: Nurse Practitioner

## 2011-04-17 ENCOUNTER — Ambulatory Visit
Admission: RE | Admit: 2011-04-17 | Discharge: 2011-04-17 | Disposition: A | Discharge status: Home / Self Care | Payer: BC Managed Care – PPO | Source: Ambulatory Visit | Attending: Nurse Practitioner | Admitting: Nurse Practitioner

## 2011-04-17 DIAGNOSIS — N6452 Nipple discharge: Secondary | ICD-10-CM

## 2011-04-17 DIAGNOSIS — N6320 Unspecified lump in the left breast, unspecified quadrant: Secondary | ICD-10-CM

## 2011-04-17 NOTE — Telephone Encounter (Signed)
Pt called to report her BP readings April 29 133/84 May 1 132/82 May 2 169/92 May 3 148/96 Today 155/95

## 2011-04-17 NOTE — Telephone Encounter (Signed)
Good overall, cont current meds

## 2011-04-20 ENCOUNTER — Other Ambulatory Visit (INDEPENDENT_AMBULATORY_CARE_PROVIDER_SITE_OTHER): Payer: BC Managed Care – PPO

## 2011-04-20 DIAGNOSIS — I10 Essential (primary) hypertension: Secondary | ICD-10-CM

## 2011-04-20 LAB — BASIC METABOLIC PANEL
CO2: 31 mEq/L (ref 19–32)
Calcium: 9.4 mg/dL (ref 8.4–10.5)
Glucose, Bld: 95 mg/dL (ref 70–99)
Potassium: 3.7 mEq/L (ref 3.5–5.1)
Sodium: 141 mEq/L (ref 135–145)

## 2011-04-20 NOTE — Telephone Encounter (Signed)
Pt advised via VM 

## 2011-04-20 NOTE — Progress Notes (Signed)
Quick Note:  Voice message left on [employer] system - lab is negative, normal or otherwise stable, pt to continue same tx ______ 

## 2011-04-24 ENCOUNTER — Telehealth: Payer: Self-pay

## 2011-04-24 NOTE — Telephone Encounter (Signed)
Does seem somewhat elev at time, but I would cont the same medication as is for now

## 2011-04-24 NOTE — Telephone Encounter (Signed)
Pt advised via VM 

## 2011-04-24 NOTE — Telephone Encounter (Signed)
Pt called with BP report May 3   126/80 May 5   155/95 May 7   150/89 May 8   126/80 May 9   142/90 May 10 116/75 Today   135/84

## 2011-04-28 NOTE — Op Note (Signed)
NAME:  [name] [name], [name] [name]               ACCOUNT NO.:  [id number]   MEDICAL RECORD NO.:  [id number]          PATIENT TYPE:  AMB   LOCATION:  [facility]                         FACILITY:  [facility]   PHYSICIAN:  Angelia Mould. Derrell Lolling, M.D.DATE OF BIRTH:  06-Oct-1951   DATE OF PROCEDURE:  05/04/2007  DATE OF DISCHARGE:                               OPERATIVE REPORT   PREOPERATIVE DIAGNOSIS:  Hematoma left breast.   POSTOPERATIVE DIAGNOSIS:  Hematoma left breast.   OPERATION PERFORMED:  Evacuation of hematoma left breast.   SURGEON:  Claud Kelp, MD   OPERATIVE INDICATIONS:  This is a 60 year old black female who underwent  excision of a left breast mass in the subareolar area on 04/20/2007.  This turned out to be a benign ductal papilloma.  Her breast has become  swollen and she has a hematoma in it.  I saw her on May16 and advised  her to return to the operating room for evacuation of this for comfort  and to avoid skin necrosis.  She decided to think about then over the  weekend but then called me one or two days ago said that she wanted this  removed.  She was scheduled for evacuation of the left breast hematoma  today.   OPERATIVE TECHNIQUE:  Following induction of general endotracheal  anesthesia the patient's left breast was prepped and draped in sterile  fashion.  The patient was identified as to correct patient, correct  procedure and correct site.  Intravenous antibiotics were given prior to  the incision.  I very carefully reopened the inferiorly placed  circumareolar incision and entered the hematoma cavity.  I evacuated  what turned out to be solid gelatinous hematoma.  There really was not  much in the way of liquid hematoma.  This was probably at least 6 to 8  ounces.  I evacuated this completely.  I irrigated this out with  irrigating device about 2000 mL of saline.  The surface of the hematoma  cavity was raw but there was no focal bleeder.  I cauterized as much as  I could and  packed the wound.  It seemed dry.  I felt that there was  high risk for seroma formation and so I placed a 19-French Blake drain  in the wound and brought out through the skin crease incision in the  left axilla.  The drain was sutured to the skin with nylon suture and  connected to a suction bulb.  Skin was closed with multiple interrupted  sutures of 4-0 nylon.  Clean bandages were placed and the patient taken  recovery room in stable condition.  Estimated blood loss was about 150  mL old clotted blood and minimal acute bleeding.  Sponge, needle and  instrument counts were correct.      Angelia Mould. Derrell Lolling, M.D.  Electronically Signed     HMI/MEDQ  D:  05/04/2007  T:  05/04/2007  Job:  [id number]

## 2011-04-28 NOTE — Op Note (Signed)
NAME:  [name] [name], [name] [name]               ACCOUNT NO.:  [id number]   MEDICAL RECORD NO.:  [id number]          PATIENT TYPE:  AMB   LOCATION:  [facility]                         FACILITY:  [facility]   PHYSICIAN:  Angelia Mould. Derrell Lolling, M.D.DATE OF BIRTH:  Nov 15, 1951   DATE OF PROCEDURE:  04/20/2007  DATE OF DISCHARGE:                               OPERATIVE REPORT   PREOPERATIVE DIAGNOSIS:  Intraductal papilloma, left breast.   POSTOPERATIVE DIAGNOSIS:  Intraductal papilloma, left breast, pathology  pending.   OPERATION PERFORMED:  Excision left breast mass.   SURGEON:  Angelia Mould. Derrell Lolling, M.D.   OPERATIVE INDICATIONS:  This is a 60 year old black female who has a 2-  month history of spontaneous left nipple discharge.  Initially it was  clear and then became green and she has recently seen some blood.  She  had mammograms ultrasounds and ductogram.  The ductogram shows a filling  defect about 1.5 to 2 cm below the nipple centrally consistent with a  papilloma or, less likely, an intraductal tumor.  She is been counseled  as an outpatient have this area excised which she is in favor of, she is  brought to operating room electively.   OPERATIVE TECHNIQUE:  Following induction of a general LMA anesthetic,  the patient's left breast was prepped, draped in sterile fashion.  The  patient was identified as correct patient, correct procedure and correct  site.  I was able to easily elicited amber colored discharge from the  left nipple from a very centrally located duct, perhaps slightly  superiorly located.  I was able to intubate this with a lacrimal duct  probe in the probe went about 5 cm in directly posteriorly.  0.5%  Marcaine with epinephrine as a local infiltration anesthetic.  A curved  incision was made at the lower areolar margin.  Dissection was carried  down into the breast tissue and to the subareolar space.  We were  careful to leave all of the nipple and areolar  dermis and some of the  superficial fatty tissue in place.  We dissected all the way around the  involved duct.  We marked the superficial aspect of the duct with a silk  suture.  We marked the lateral aspect of the specimen with a long silk  suture.  We dissected out a piece of tissue that was somewhat larger  than a golf ball circumferentially, taking all the subareolar tissue.  This was sent to the pathology lab for routine evaluation in fresh  condition.  Hemostasis was excellent and achieved with electrocautery.  The wound was irrigated with saline.  I did not think I could close the  deeper tissue without deforming side the breast in  this area so I chose to simply close the skin with a running  subcuticular suture of 4-0 Monocryl and Steri-Strips.  Clean bandages  were placed and the patient taken recovery room in stable condition.  Estimated blood loss was about 15 mL.  Complications none.  Sponge,  needle and instrument counts were correct.      Angelia Mould. Derrell Lolling, M.D.  Electronically Signed  HMI/MEDQ  D:  04/20/2007  T:  04/20/2007  Job:  [id number]   cc:   Dr. Jeralyn Ruths   Sean A. Everardo All, MD  [address]  [place]  [place] [address]   Corwin Levins, MD  [address]  [name]  [place] [address]

## 2011-06-11 ENCOUNTER — Encounter (HOSPITAL_BASED_OUTPATIENT_CLINIC_OR_DEPARTMENT_OTHER)
Admission: RE | Admit: 2011-06-11 | Discharge: 2011-06-11 | Disposition: A | Discharge status: Home / Self Care | Payer: BC Managed Care – PPO | Source: Ambulatory Visit | Attending: General Surgery | Admitting: General Surgery

## 2011-06-11 LAB — URINALYSIS, ROUTINE W REFLEX MICROSCOPIC
Bilirubin Urine: NEGATIVE
Glucose, UA: NEGATIVE mg/dL
Hgb urine dipstick: NEGATIVE
Specific Gravity, Urine: 1.023 (ref 1.005–1.030)
Urobilinogen, UA: 1 mg/dL (ref 0.0–1.0)

## 2011-06-11 LAB — URINE MICROSCOPIC-ADD ON

## 2011-06-11 LAB — COMPREHENSIVE METABOLIC PANEL
ALT: 12 U/L (ref 0–35)
AST: 17 U/L (ref 0–37)
Albumin: 4.2 g/dL (ref 3.5–5.2)
Calcium: 8.9 mg/dL (ref 8.4–10.5)
Creatinine, Ser: 0.59 mg/dL (ref 0.50–1.10)
GFR calc non Af Amer: >60 mL/min (ref >60)
Sodium: 140 mEq/L (ref 135–145)
Total Protein: 7.7 g/dL (ref 6.0–8.3)

## 2011-06-11 LAB — DIFFERENTIAL
Basophils Absolute: 0 10*3/uL (ref 0.0–0.1)
Eosinophils Relative: 2 % (ref 0–5)
Lymphocytes Relative: 36 % (ref 12–46)
Lymphs Abs: 2.3 10*3/uL (ref 0.7–4.0)
Monocytes Absolute: 0.5 10*3/uL (ref 0.1–1.0)
Neutro Abs: 3.6 10*3/uL (ref 1.7–7.7)

## 2011-06-11 LAB — CBC
HCT: 41.7 % (ref 36.0–46.0)
Hemoglobin: 14.4 g/dL (ref 12.0–15.0)
MCV: 87.4 fL (ref 78.0–100.0)
RDW: 14.5 % (ref 11.5–15.5)
WBC: 6.5 10*3/uL (ref 4.0–10.5)

## 2011-06-12 ENCOUNTER — Ambulatory Visit (HOSPITAL_BASED_OUTPATIENT_CLINIC_OR_DEPARTMENT_OTHER)
Admission: RE | Admit: 2011-06-12 | Discharge: 2011-06-12 | Disposition: A | Discharge status: Home / Self Care | Payer: BC Managed Care – PPO | Source: Ambulatory Visit | Attending: General Surgery | Admitting: General Surgery

## 2011-06-12 ENCOUNTER — Other Ambulatory Visit (INDEPENDENT_AMBULATORY_CARE_PROVIDER_SITE_OTHER): Payer: Self-pay | Admitting: General Surgery

## 2011-06-12 DIAGNOSIS — N6039 Fibrosclerosis of unspecified breast: Secondary | ICD-10-CM | POA: Insufficient documentation

## 2011-06-12 DIAGNOSIS — D249 Benign neoplasm of unspecified breast: Secondary | ICD-10-CM

## 2011-06-12 DIAGNOSIS — Z8673 Personal history of transient ischemic attack (TIA), and cerebral infarction without residual deficits: Secondary | ICD-10-CM | POA: Insufficient documentation

## 2011-06-12 DIAGNOSIS — Z01812 Encounter for preprocedural laboratory examination: Secondary | ICD-10-CM | POA: Insufficient documentation

## 2011-06-12 DIAGNOSIS — N6459 Other signs and symptoms in breast: Secondary | ICD-10-CM

## 2011-06-12 DIAGNOSIS — I10 Essential (primary) hypertension: Secondary | ICD-10-CM | POA: Insufficient documentation

## 2011-06-12 DIAGNOSIS — J45909 Unspecified asthma, uncomplicated: Secondary | ICD-10-CM | POA: Insufficient documentation

## 2011-06-18 ENCOUNTER — Telehealth (INDEPENDENT_AMBULATORY_CARE_PROVIDER_SITE_OTHER): Payer: Self-pay

## 2011-06-18 NOTE — Telephone Encounter (Signed)
Pt notified of path result. Has f/u ov 7-26.

## 2011-06-22 NOTE — Op Note (Signed)
NAME:  [name] [name], [name] [name]               ACCOUNT NO.:  [id number]  MEDICAL RECORD NO.:  [id number]  LOCATION:                                 FACILITY:  PHYSICIAN:  Angelia Mould. Derrell Lolling, M.D.DATE OF BIRTH:  [birth year]  DATE OF PROCEDURE:  06/12/2011 DATE OF DISCHARGE:                              OPERATIVE REPORT   PREOPERATIVE DIAGNOSIS:  Recurrent bloody nipple discharge, left breast, suspect intraductal papilloma.  POSTOPERATIVE DIAGNOSIS:  Recurrent bloody nipple discharge, left breast, suspect intraductal papilloma.  OPERATION PERFORMED:  Excision left breast subareolar tissue.  SURGEON:  Angelia Mould. Derrell Lolling, MD  OPERATIVE INDICATIONS:  This is a 60 year old African American female who has a past history of papillomas in the left breast excised in 2008. She presents at this time with a history of left nipple discharge for about 2 months.  There is no mass or pain.  Sometimes, the drainage is clear and sometimes it is very dark.  This is spontaneous drainage. Recent radiograph evaluation included mammograms and ultrasound.  They attempted a ductogram but this was not successful.  Ultrasound showed a 1 cm mixed cystic and solid area in the immediate area posterior to the skin of the nipple which was BI-RADs category 4.  I evaluated the patient in the office.  There was no palpable mass.  I could express discharge from a very central duct in the left nipple, in the office this was clear.  She is brought to the operating room electively to excise the subareolar breast tissue.  OPERATIVE TECHNIQUE:  Following the induction of general endotracheal anesthesia, the patient's left breast was prepped and draped in a sterile fashion.  Intravenous antibiotics were given.  The patient was identified as the correct patient, correct procedure and correct site. Marcaine 0.25% with epinephrine was used as a local infiltration anesthetic.  I chose a circumareolar incision at the areolar margin  inferiorly, through a previous scar.  I could elicit a dark discharge from a central nipple duct but even the smallest lacrimal duct probe would not go into any duct in this area despite several attempts.  I then dissected all of the subareolar breast tissue, taking out about a 2.5 cm diameter area and completely removing all of the subareolar breast tissue.  Her breast was very thin and this actually went down to the chest wall, although the thickness of the specimen was less than 2 cm.  I did not palpate a mass in the specimen.  It was sent to the pathology lab with the appropriate history attached.  The skin of the nipple and its underlying dermis felt healthy and had good blood supply.  I could not express any further drainage from the nipple.  The wound was irrigated with saline. I closed the breast tissue in layers with interrupted sutures of 3-0 Vicryl and the skin was closed with a running subcuticular suture of 4-0 Monocryl and Dermabond.  Clean bandages were placed and the patient taken to recovery room in stable condition.  Estimated blood loss was about 10 mL. Complications none.  Sponge, needle and instrument counts were correct.     Angelia Mould. Derrell Lolling, M.D.   ______________________________ Angelia Mould.  Derrell Lolling, M.D.    HMI/MEDQ  D:  06/12/2011  T:  06/13/2011  Job:  [id number]  cc:   [facility], MD  Electronically Signed by Claud Kelp M.D. on 06/22/2011 12:28:53 PM

## 2011-07-09 ENCOUNTER — Encounter (INDEPENDENT_AMBULATORY_CARE_PROVIDER_SITE_OTHER): Payer: Self-pay | Admitting: General Surgery

## 2011-07-09 ENCOUNTER — Encounter (INDEPENDENT_AMBULATORY_CARE_PROVIDER_SITE_OTHER): Payer: BC Managed Care – PPO | Admitting: General Surgery

## 2011-07-09 ENCOUNTER — Ambulatory Visit (INDEPENDENT_AMBULATORY_CARE_PROVIDER_SITE_OTHER): Payer: BC Managed Care – PPO | Admitting: General Surgery

## 2011-07-09 DIAGNOSIS — N6459 Other signs and symptoms in breast: Secondary | ICD-10-CM

## 2011-07-09 DIAGNOSIS — N6452 Nipple discharge: Secondary | ICD-10-CM

## 2011-07-09 NOTE — Progress Notes (Signed)
Subjective:     Patient ID: [name] [name], female   DOB: Jan 15, 1951, 60 y.o.   MRN: [id number]  HPI  Patient returns following excision of left breast subareolar tissue for nipple discharge. She has no complaints about her breast. No pain no swelling no fever. The pathology report showed benign breast tissue and stromal fibrosis. There was no papilloma or malignancy seen. She is very pleased. She states that she has no more drainage from her nipple and she is pleased about that. Review of Systems     Objective:   Physical Exam    Left breast exam reveals healthy breast tissue and healthy nipple and areola. No skin necrosis, no hematoma, no drainage, no signs of infection. Incision is healing nicely. Assessment:     Left nipple discharge in the female. Resolved following subareolar tissue excision.    Plan:     The patient was reassured.  She is advised to get an annual breast exam with her primary care physician or her gynecologist.  She is advised to get annual mammograms. She is to return to see me on a p.r.n. Basis.

## 2011-07-09 NOTE — Patient Instructions (Signed)
The wound in your left breast is healing very nicely. The final pathology report shows a benign breast tissue and scar tissue but no evidence of any cancer or any benign tumors. I advise you to get annual breast exam with  your gynecologist or your primary care physician, and advise you do get annual mammogram. Return to see me if there are any new or further problems.

## 2011-08-21 ENCOUNTER — Encounter: Payer: Self-pay | Admitting: Physician Assistant

## 2011-08-25 ENCOUNTER — Telehealth: Payer: Self-pay

## 2011-08-25 NOTE — Telephone Encounter (Signed)
Patient called to informed only received #15 of Ambien on Sept. 8, 2012 from pharmacy and prescription refill was for #30. I called the pharmacy they informed needs PA to fill over #15, [facility]  informed would fax PA form.

## 2011-08-26 NOTE — Telephone Encounter (Signed)
Received PA fax. Called [phone] initiate PA, case #[id number]. They approved zolpidem from 08/05/2011 through 08/25/2012.  Will call the patient and inform of approval as well as pharmacy. Faxed approval letter to pharmacy and put in scan basket to scan.

## 2011-08-26 NOTE — Telephone Encounter (Signed)
Called the patient informed PA approval for Ambien from 08/05/2011 through 08/25/2012 for prescribed amount.

## 2011-10-07 ENCOUNTER — Other Ambulatory Visit: Payer: BC Managed Care – PPO

## 2011-10-07 ENCOUNTER — Ambulatory Visit: Payer: BC Managed Care – PPO

## 2011-10-12 ENCOUNTER — Other Ambulatory Visit: Payer: Self-pay

## 2011-10-12 MED ORDER — ZOLPIDEM TARTRATE 10 MG PO TABS: 10.0000 mg | ORAL_TABLET | Freq: Every day | ORAL | Status: DC | PRN

## 2011-10-12 NOTE — Telephone Encounter (Signed)
Done hardcopy to robin  

## 2011-10-13 NOTE — Telephone Encounter (Signed)
Faxed hardcopy to [facility] [phone]

## 2011-10-14 ENCOUNTER — Ambulatory Visit (INDEPENDENT_AMBULATORY_CARE_PROVIDER_SITE_OTHER): Payer: BC Managed Care – PPO | Admitting: Physician Assistant

## 2011-10-14 ENCOUNTER — Ambulatory Visit (INDEPENDENT_AMBULATORY_CARE_PROVIDER_SITE_OTHER): Payer: BC Managed Care – PPO | Admitting: Vascular Surgery

## 2011-10-14 VITALS — BP 132/68 | HR 72

## 2011-10-14 DIAGNOSIS — Z48812 Encounter for surgical aftercare following surgery on the circulatory system: Secondary | ICD-10-CM

## 2011-10-14 DIAGNOSIS — I6529 Occlusion and stenosis of unspecified carotid artery: Secondary | ICD-10-CM

## 2011-10-14 DIAGNOSIS — I6789 Other cerebrovascular disease: Secondary | ICD-10-CM

## 2011-10-14 DIAGNOSIS — Z8673 Personal history of transient ischemic attack (TIA), and cerebral infarction without residual deficits: Secondary | ICD-10-CM

## 2011-10-14 NOTE — Progress Notes (Signed)
[facility] HISTORY AND PHYSICAL   CC: Oliver Barre, MD  HPI: This is a 60 y.o. female here for f/u carotid duplex scan after having a CVA and left CEA in March of 2012.  She is doing fine and has not experienced any signs of CVA.  She states that she does have some blurriness of her vision in her right eye when she turns her head to the left and it only happens occasionally.  Otherwise, she has not experienced any amaurosis fugax, paraesthesias, or hemiparalysis.  She has done quite well from her left CEA.  Past Medical History  Diagnosis Date  . HYPOTHYROIDISM 11/06/2007  . HYPERLIPIDEMIA 11/03/2010  . HYPERTENSION 09/13/2007  . ALLERGIC RHINITIS 11/06/2007  . ASTHMA 09/13/2007  . PERIMENOPAUSAL STATUS 09/13/2007  . OSTEOPOROSIS 11/06/2007  . ALCOHOL ABUSE, HX OF 11/06/2007  . COLONIC POLYPS, HX OF 02/09/2008  . INSOMNIA, HX OF 09/13/2007  . Anxiety 04/03/2011  . Stroke    Past Surgical History  Procedure Date  . Cesarean section     x 3  . S/p breast cancer surgury 04/2007    benign  . Breast surgery     Allergies  Allergen Reactions  . Alendronate Sodium (Fosamax)   . Penicillins     Current Outpatient Prescriptions  Medication Sig Dispense Refill  . acetaminophen (TYLENOL) 325 MG tablet Take 650 mg by mouth every 6 (six) hours as needed.        Marland Kitchen albuterol (PROAIR HFA) 108 (90 BASE) MCG/ACT inhaler Inhale 2 puffs into the lungs every 4 (four) hours as needed.        Marland Kitchen amLODipine-benazepril (LOTREL) 5-40 MG per capsule Take 1 capsule by mouth daily.  30 capsule  11  . aspirin 325 MG EC tablet Take 325 mg by mouth daily.        Marland Kitchen atorvastatin (LIPITOR) 20 MG tablet Take 20 mg by mouth daily.        . Calcium Carbonate-Vitamin D (CALCIUM-VITAMIN D) 500-200 MG-UNIT per tablet Take 1 tablet by mouth daily.        . Fluticasone-Salmeterol (ADVAIR DISKUS) 250-50 MCG/DOSE AEPB Inhale 1 puff into the lungs 2 (two) times daily.        Marland Kitchen lisinopril  (PRINIVIL,ZESTRIL) 20 MG tablet Take 20 mg by mouth daily.        . montelukast (SINGULAIR) 10 MG tablet Take 10 mg by mouth daily.        . naproxen sodium (ANAPROX) 220 MG tablet Take 220 mg by mouth 2 (two) times daily with a meal.        . oxyCODONE-acetaminophen (PERCOCET) 5-325 MG per tablet Take 1 tablet by mouth. Take 1-2 tablets every 4 hours as needed       . potassium chloride (KLOR-CON) 10 MEQ CR tablet Take 10 mEq by mouth daily.        Marland Kitchen zolpidem (AMBIEN) 10 MG tablet Take 1 tablet (10 mg total) by mouth daily as needed.  30 tablet  5    Family History  Problem Relation Age of Onset  . Hyperlipidemia Other   . Hypertension Other   . Coronary artery disease Other     History   Social History  . Marital Status: Divorced    Spouse Name: N/A    Number of Children: 3  . Years of Education: N/A   Occupational History  . CAFETERIA MANAGER [employer]   Social History Main Topics  .  Smoking status: Never Smoker   . Smokeless tobacco: Not on file  . Alcohol Use: No  . Drug Use: No  . Sexually Active: Not on file   Other Topics Concern  . Not on file   Social History Narrative  . No narrative on file     ROS: [x]  Positive   [ ]  Negative   [ ]  All sytems reviewed and are negative  General: [ ]  Weight loss, [ ]  Fever, [ ]  chills Neurologic: [ ]  Dizziness, [ ]  Blackouts, [ ]  Seizure [ ]  Stroke, [ ]  "Mini stroke", [ ]  Slurred speech, [ ]  Temporary blindness; [ ]  weakness in arms or legs [x]  some blurriness of vision in the right eye when driving or turning her head to the right Cardiac: [ ]  Chest pain/pressure, [ ]  Shortness of breath at rest [ ]  Shortness of breath with exertion, [ ]  Atrial fibrillation or irregular heartbeat Vascular: [ ]  Pain in legs with walking, [ ]  Pain in legs at rest, [ ]  Pain in legs at night,  [ ]  Non-healing ulcer, [ ]  Blood clot in vein/DVT,   Pulmonary: [ ]  Home oxygen, [ ]  Productive cough, [ ]  hemoptysis, [X]  Asthma,  [ ]   Wheezing Musculoskeletal:  [ ]  Arthritis, [ ] ; [ ]  Joint pain Hematologic: [ ]  Easy Bruising, [ ]  Anemia; [ ]  Hepatitis Gastrointestinal: [ ]  Melena, [ ]  Hx of stomach ulcers;  [ ]  Gastroesophageal Reflux/heartburn, [ ]  Trouble swallowing Urinary: [ ]  chronic Kidney disease, [ ]  on HD - [ ]  MWF or [ ]  TTHS, [ ]  Burning with urination, [ ]  Difficulty urinating; [ ]  Hematuria Skin: [ ]  Rashes, [ ]  Wounds Psychological: [ ]  Anxiety, [ ]  Depression   PHYSICAL EXAMINATION:  Filed Vitals:   10/14/11 1421  BP: 132/68  Pulse: 72       General:  WDWN in NAD Gait: Normal HENT: WNL, well healed left CEA scar Eyes: PERRL Pulmonary: normal non-labored breathing , without Rales, rhonchi,  wheezing Cardiac: RRR, without  Murmurs, rubs or gallops; No carotid bruits Abdomen: soft, NT, no masses Skin: no rashes, ulcers noted Vascular Exam/Pulses: 2+ radial pulses bilaterally Extremities without ischemic changes, no Gangrene , no cellulitis; no open wounds;  Musculoskeletal: no muscle wasting or atrophy  Neurologic: A&O X 3; Appropriate Affect ; SENSATION: normal; MOTOR FUNCTION:  moving all extremities equally. Speech is fluent/normal  Non-Invasive Vascular Imaging:  RICA STENOSIS PRESENT IN THE 40-59% RANGE. LICA is patent with HX of endarterectomy 3/12.  Rt vertebral artery is patent and antegrade.  Left vertebral is abnormal and presents with to-fro flow.  ASSESSMENT: 60 y.o. female w/ Hx of CVA and Left CEA in March 2012.  Dr. Edilia Bo did speak with the pt regarding her blurriness and recommended f/u in 6 months with Dr. Darrick Penna.  He did not think this was related to her carotid artery stenosis.  PLAN:  Bilateral carotid duplex scan and f/u with Dr. Darrick Penna in 6 months.   Newton Pigg, PA-C    Clinic MD Edilia Bo

## 2011-10-14 NOTE — Progress Notes (Signed)
Addended by: Darl Householder on: 10/14/2011 03:01 PM   Modules accepted: Level of Service

## 2011-10-23 NOTE — Procedures (Unsigned)
CAROTID DUPLEX EXAM  INDICATION:  Follow up carotid stenosis.  HISTORY: Diabetes:  No. Cardiac:  No. Hypertension:  Yes. Smoking:  Previous. Previous Surgery:  Left carotid endarterectomy on 03/09/2011. CV History:  CVA in March 2012. Amaurosis Fugax No, Paresthesias No, Hemiparesis No.                                      RIGHT             LEFT Brachial systolic pressure:         132               136 Brachial Doppler waveforms:         WNL               WNL Vertebral direction of flow:        Antegrade         Abnormal, to-fro DUPLEX VELOCITIES (cm/sec) CCA peak systolic                   115               114 ECA peak systolic                   126               109 ICA peak systolic                   157               57 ICA end diastolic                   57                15 PLAQUE MORPHOLOGY:                  Calcified         Not visualized PLAQUE AMOUNT:                      Moderate          Not visualized PLAQUE LOCATION:                    CCA/ICA           Not visualized  IMPRESSION: 1. Right internal carotid artery stenosis present in the 40% to 59%     range. 2. Left internal carotid artery is patent with a history of     endarterectomy.  No evidence of hyperplasia or stenosis is     identified. 3. Right vertebral artery is patent and antegrade. 4. Left vertebral artery is abnormal and presents with to-fro flow.  ___________________________________________ Janetta Hora Fields, MD  SH/MEDQ  D:  10/14/2011  T:  10/14/2011  Job:  [id number]

## 2011-10-27 ENCOUNTER — Telehealth: Payer: Self-pay

## 2011-10-27 DIAGNOSIS — Z0000 Encounter for general adult medical examination without abnormal findings: Secondary | ICD-10-CM

## 2011-10-27 NOTE — Telephone Encounter (Signed)
Put order in for physical labs. 

## 2011-10-28 ENCOUNTER — Other Ambulatory Visit (INDEPENDENT_AMBULATORY_CARE_PROVIDER_SITE_OTHER): Payer: BC Managed Care – PPO

## 2011-10-28 DIAGNOSIS — Z0000 Encounter for general adult medical examination without abnormal findings: Secondary | ICD-10-CM

## 2011-10-28 LAB — BASIC METABOLIC PANEL
BUN: 14 mg/dL (ref 6–23)
Calcium: 9.3 mg/dL (ref 8.4–10.5)
Chloride: 102 mEq/L (ref 96–112)
Creatinine, Ser: 0.8 mg/dL (ref 0.4–1.2)

## 2011-10-28 LAB — CBC WITH DIFFERENTIAL/PLATELET
Basophils Relative: 0.2 % (ref 0.0–3.0)
Eosinophils Absolute: 0.1 10*3/uL (ref 0.0–0.7)
HCT: 40 % (ref 36.0–46.0)
Hemoglobin: 13.4 g/dL (ref 12.0–15.0)
Lymphocytes Relative: 29.7 % (ref 12.0–46.0)
Lymphs Abs: 1.8 10*3/uL (ref 0.7–4.0)
MCHC: 33.6 g/dL (ref 30.0–36.0)
MCV: 90.5 fl (ref 78.0–100.0)
Neutro Abs: 3.7 10*3/uL (ref 1.4–7.7)
RBC: 4.42 Mil/uL (ref 3.87–5.11)
RDW: 14.7 % — ABNORMAL HIGH (ref 11.5–14.6)

## 2011-10-28 LAB — URINALYSIS, ROUTINE W REFLEX MICROSCOPIC
Bilirubin Urine: NEGATIVE
Hgb urine dipstick: NEGATIVE
Leukocytes, UA: NEGATIVE
Nitrite: NEGATIVE
pH: 6.5 (ref 5.0–8.0)

## 2011-10-28 LAB — LIPID PANEL: Total CHOL/HDL Ratio: 3

## 2011-10-28 LAB — TSH: TSH: 2.92 u[IU]/mL (ref 0.35–5.50)

## 2011-10-28 LAB — HEPATIC FUNCTION PANEL
ALT: 16 U/L (ref 0–35)
Total Bilirubin: 1 mg/dL (ref 0.3–1.2)
Total Protein: 7.3 g/dL (ref 6.0–8.3)

## 2011-11-02 ENCOUNTER — Other Ambulatory Visit: Payer: BC Managed Care – PPO

## 2011-11-04 ENCOUNTER — Ambulatory Visit (INDEPENDENT_AMBULATORY_CARE_PROVIDER_SITE_OTHER): Payer: BC Managed Care – PPO | Admitting: Internal Medicine

## 2011-11-04 ENCOUNTER — Encounter: Payer: Self-pay | Admitting: Internal Medicine

## 2011-11-04 VITALS — BP 110/68 | HR 73 | Temp 98.4°F | Ht 59.0 in | Wt 145.0 lb

## 2011-11-04 DIAGNOSIS — E785 Hyperlipidemia, unspecified: Secondary | ICD-10-CM

## 2011-11-04 DIAGNOSIS — Z0000 Encounter for general adult medical examination without abnormal findings: Secondary | ICD-10-CM

## 2011-11-04 DIAGNOSIS — G459 Transient cerebral ischemic attack, unspecified: Secondary | ICD-10-CM

## 2011-11-04 DIAGNOSIS — I10 Essential (primary) hypertension: Secondary | ICD-10-CM

## 2011-11-04 HISTORY — DX: Transient cerebral ischemic attack, unspecified: G45.9

## 2011-11-04 MED ORDER — FLUTICASONE-SALMETEROL 250-50 MCG/DOSE IN AEPB: 1.0000 | INHALATION_SPRAY | Freq: Two times a day (BID) | RESPIRATORY_TRACT | Status: DC

## 2011-11-04 MED ORDER — AMLODIPINE BESY-BENAZEPRIL HCL 5-40 MG PO CAPS: 1.0000 | ORAL_CAPSULE | Freq: Every day | ORAL | Status: DC

## 2011-11-04 MED ORDER — MONTELUKAST SODIUM 10 MG PO TABS: 10.0000 mg | ORAL_TABLET | Freq: Every day | ORAL | Status: DC

## 2011-11-04 MED ORDER — ATORVASTATIN CALCIUM 20 MG PO TABS: 20.0000 mg | ORAL_TABLET | Freq: Every day | ORAL | Status: DC

## 2011-11-04 MED ORDER — LEVALBUTEROL TARTRATE 45 MCG/ACT IN AERO: 2.0000 | INHALATION_SPRAY | Freq: Four times a day (QID) | RESPIRATORY_TRACT | Status: DC | PRN

## 2011-11-04 MED ORDER — CLOPIDOGREL BISULFATE 75 MG PO TABS: 75.0000 mg | ORAL_TABLET | Freq: Every day | ORAL | Status: AC

## 2011-11-04 NOTE — Assessment & Plan Note (Addendum)
stable overall by hx and exam, most recent data reviewed with pt, and pt to continue medical treatment as before, pt states has not been taking the statin daily - often forgets Lab Results  Component Value Date   LDLCALC 111* 10/28/2011

## 2011-11-04 NOTE — Assessment & Plan Note (Signed)
Hx c/w recent TIA, but pt is adamant she wants no further evaluation or neurology referral at this time; she is willing to take plavix - will d/c asa, for plavix asd

## 2011-11-04 NOTE — Assessment & Plan Note (Signed)

## 2011-11-04 NOTE — Progress Notes (Signed)
Subjective:    Patient ID: [name] [name], female    DOB: 1951-04-15, 60 y.o.   MRN: [id number]  HPI Here for wellness and f/u;  Overall doing ok;  Pt denies CP, worsening SOB, DOE, wheezing, orthopnea, PND, worsening LE edema, palpitations, dizziness or syncope.  Pt denies neurological change such as new Headache, facial or extremity weakness.  Pt denies polydipsia, polyuria, or low sugar symptoms. Pt states overall good compliance with treatment and medications, good tolerability, and trying to follow lower cholesterol diet.  Pt denies worsening depressive symptoms, suicidal ideation or panic. No fever, wt loss, night sweats, loss of appetite, or other constitutional symptoms.  Pt states good ability with ADL's, low fall risk, home safety reviewed and adequate, no significant changes in hearing or vision, and occasionally active with exercise.  Does mention an episode last wk (wed nov 14) at work, was tired and working with shorthanded and stressed, but had discrete episode of difficulty with speech for 10 min and resolved, no recurrence, scared her at the time, went to office and closed the door, seemed to resolve and was able to cont work, has daily HA but not worse with the episode.and Pt denies new neurological symptoms such as new headache, or facial or extremity weakness or numbness.  Overall good compliance with treatment, and good medicine tolerability. Has hx of mult TIA and at least one full stroke.  Has hx of left CEA per Dr Darrick Penna approx 7 mo ago.  Pt does not think she has been seen per neurology in the past and refuses at this time. Past Medical History  Diagnosis Date  . HYPOTHYROIDISM 11/06/2007  . HYPERLIPIDEMIA 11/03/2010  . HYPERTENSION 09/13/2007  . ALLERGIC RHINITIS 11/06/2007  . ASTHMA 09/13/2007  . PERIMENOPAUSAL STATUS 09/13/2007  . OSTEOPOROSIS 11/06/2007  . ALCOHOL ABUSE, HX OF 11/06/2007  . COLONIC POLYPS, HX OF 02/09/2008  . INSOMNIA, HX OF 09/13/2007  . Anxiety 04/03/2011    . Stroke    Past Surgical History  Procedure Date  . Cesarean section     x 3  . S/p breast cancer surgury 04/2007    benign  . Breast surgery     reports that she has never smoked. She does not have any smokeless tobacco history on file. She reports that she does not drink alcohol or use illicit drugs. family history includes Coronary artery disease in her other; Hyperlipidemia in her other; and Hypertension in her other. Allergies  Allergen Reactions  . Alendronate Sodium (Fosamax)   . Penicillins    Current Outpatient Prescriptions on File Prior to Visit  Medication Sig Dispense Refill  . acetaminophen (TYLENOL) 325 MG tablet Take 650 mg by mouth every 6 (six) hours as needed.        Marland Kitchen albuterol (PROAIR HFA) 108 (90 BASE) MCG/ACT inhaler Inhale 2 puffs into the lungs every 4 (four) hours as needed.        Marland Kitchen amLODipine-benazepril (LOTREL) 5-40 MG per capsule Take 1 capsule by mouth daily.  30 capsule  11  . aspirin 325 MG EC tablet Take 325 mg by mouth daily.        Marland Kitchen atorvastatin (LIPITOR) 20 MG tablet Take 20 mg by mouth daily.        . Calcium Carbonate-Vitamin D (CALCIUM-VITAMIN D) 500-200 MG-UNIT per tablet Take 1 tablet by mouth daily.        . Fluticasone-Salmeterol (ADVAIR DISKUS) 250-50 MCG/DOSE AEPB Inhale 1 puff into the lungs 2 (two) times  daily.        . montelukast (SINGULAIR) 10 MG tablet Take 10 mg by mouth daily.        Marland Kitchen zolpidem (AMBIEN) 10 MG tablet Take 1 tablet (10 mg total) by mouth daily as needed.  30 tablet  5   Review of Systems Review of Systems  Constitutional: Negative for diaphoresis, activity change, appetite change and unexpected weight change.  HENT: Negative for hearing loss, ear pain, facial swelling, mouth sores and neck stiffness.   Eyes: Negative for pain, redness and visual disturbance.  Respiratory: Negative for shortness of breath and wheezing.   Cardiovascular: Negative for chest pain and palpitations.  Gastrointestinal: Negative for  diarrhea, blood in stool, abdominal distention and rectal pain.  Genitourinary: Negative for hematuria, flank pain and decreased urine volume.  Musculoskeletal: Negative for myalgias and joint swelling.  Skin: Negative for color change and wound.  Neurological: Negative for syncope and numbness.  Hematological: Negative for adenopathy.  Psychiatric/Behavioral: Negative for hallucinations, self-injury, decreased concentration and agitation.      Objective:   Physical Exam BP 110/68  Pulse 73  Temp(Src) 98.4 F (36.9 C) (Oral)  Ht 4\' 11"  (1.499 m)  Wt 145 lb (65.772 kg)  BMI 29.29 kg/m2  SpO2 96% Physical Exam  VS noted Constitutional: Pt is oriented to person, place, and time. Appears well-developed and well-nourished.  HENT:  Head: Normocephalic and atraumatic.  Right Ear: External ear normal.  Left Ear: External ear normal.  Nose: Nose normal.  Mouth/Throat: Oropharynx is clear and moist.  Eyes: Conjunctivae and EOM are normal. Pupils are equal, round, and reactive to light.  Neck: Normal range of motion. Neck supple. No JVD present. No tracheal deviation present.  Cardiovascular: Normal rate, regular rhythm, normal heart sounds and intact distal pulses.   Pulmonary/Chest: Effort normal and breath sounds normal.  Abdominal: Soft. Bowel sounds are normal. There is no tenderness.  Musculoskeletal: Normal range of motion. Exhibits no edema.  Lymphadenopathy:  Has no cervical adenopathy.  Neurological: Pt is alert and oriented to person, place, and time. Pt has normal reflexes. No cranial nerve deficit.  Skin: Skin is warm and dry. No rash noted.  Psychiatric:  Has  normal mood and affect. Behavior is normal.         Assessment & Plan:

## 2011-11-04 NOTE — Assessment & Plan Note (Signed)
stable overall by hx and exam, most recent data reviewed with pt, and pt to continue medical treatment as before  BP Readings from Last 3 Encounters:  11/04/11 110/68  10/14/11 132/68  04/03/11 114/86

## 2011-11-04 NOTE — Patient Instructions (Addendum)
OK to stop the Aspirin 325 mg Please start the plavix 75 mg per day Continue all other medications as before; you are given the refills today OK to change the albuterol inhaler to Xopenex (to use the same way) since we had the discount card today OK to take your lipitor in the AM if you tend to forget in the PM Please return in 6 mo with Lab testing done 3-5 days before

## 2011-11-09 ENCOUNTER — Ambulatory Visit: Payer: BC Managed Care – PPO | Admitting: Internal Medicine

## 2012-02-04 ENCOUNTER — Encounter: Payer: Self-pay | Admitting: Internal Medicine

## 2012-02-04 ENCOUNTER — Ambulatory Visit (INDEPENDENT_AMBULATORY_CARE_PROVIDER_SITE_OTHER): Payer: BC Managed Care – PPO | Admitting: Internal Medicine

## 2012-02-04 VITALS — BP 130/90 | HR 74 | Temp 98.3°F | Ht 59.0 in | Wt 143.0 lb

## 2012-02-04 DIAGNOSIS — R221 Localized swelling, mass and lump, neck: Secondary | ICD-10-CM

## 2012-02-04 DIAGNOSIS — K0500 Acute gingivitis, plaque induced: Secondary | ICD-10-CM

## 2012-02-04 DIAGNOSIS — I10 Essential (primary) hypertension: Secondary | ICD-10-CM

## 2012-02-04 DIAGNOSIS — Z9189 Other specified personal risk factors, not elsewhere classified: Secondary | ICD-10-CM

## 2012-02-04 DIAGNOSIS — R220 Localized swelling, mass and lump, head: Secondary | ICD-10-CM

## 2012-02-04 MED ORDER — FLUTICASONE-SALMETEROL 250-50 MCG/DOSE IN AEPB: 1.0000 | INHALATION_SPRAY | Freq: Two times a day (BID) | RESPIRATORY_TRACT | Status: DC

## 2012-02-04 MED ORDER — AMLODIPINE BESY-BENAZEPRIL HCL 5-40 MG PO CAPS: 1.0000 | ORAL_CAPSULE | Freq: Every day | ORAL | Status: DC

## 2012-02-04 MED ORDER — LEVALBUTEROL TARTRATE 45 MCG/ACT IN AERO: 2.0000 | INHALATION_SPRAY | Freq: Four times a day (QID) | RESPIRATORY_TRACT | Status: DC | PRN

## 2012-02-04 MED ORDER — ZOLPIDEM TARTRATE 10 MG PO TABS: 10.0000 mg | ORAL_TABLET | Freq: Every day | ORAL | Status: DC | PRN

## 2012-02-04 MED ORDER — LEVOFLOXACIN 250 MG PO TABS: 250.0000 mg | ORAL_TABLET | Freq: Every day | ORAL | Status: AC

## 2012-02-04 MED ORDER — MONTELUKAST SODIUM 10 MG PO TABS: 10.0000 mg | ORAL_TABLET | Freq: Every day | ORAL | Status: DC

## 2012-02-04 MED ORDER — ATORVASTATIN CALCIUM 20 MG PO TABS: 20.0000 mg | ORAL_TABLET | Freq: Every day | ORAL | Status: DC

## 2012-02-04 NOTE — Assessment & Plan Note (Signed)
stable overall by hx and exam, most recent data reviewed with pt, and pt to continue medical treatment as before  BP Readings from Last 3 Encounters:  02/04/12 130/90  11/04/11 110/68  10/14/11 132/68

## 2012-02-04 NOTE — Assessment & Plan Note (Signed)
Nondiscrete, small, exam o/w benign, for levaquin as above, diff includes cyst, LA, or salivary gland;  Ok to  to f/u any worsening symptoms or concerns, pt to call if larger or other symtpoms for ENT referrral

## 2012-02-04 NOTE — Progress Notes (Signed)
Subjective:    Patient ID: [name] [name], female    DOB: 1951-10-07, 61 y.o.   MRN: [id number]  HPI  Here to f/u, unfortunatley has several teeth in disrepair but no significant gum swelling or pain, has seen dentist and given $2900 est to work on teeth;  But also noticed a small lump to right angle of jaw and asked to f/u here; pt herself cannot apprec a lump or any other ENT anbnormality such as HA, sinus symptoms, ear pain, ST or cough.  Has recent left neck sharp pain, mild , without radicular symtpoms or UE pain/weak/numb for 2 days but thinks she simply strained left neck or slept in a wrong way.  Also with worsening insomnia for 2-3 months, Denies worsening depressive symptoms, suicidal ideation, or panic, though has ongoing anxiety, not increased recently.   Pt denies chest pain, increased sob or doe, wheezing, orthopnea, PND, increased LE swelling, palpitations, dizziness or syncope.  Pt denies new neurological symptoms such as new headache, or facial or extremity weakness or numbness   Pt denies polydipsia, polyuria Past Medical History  Diagnosis Date  . HYPOTHYROIDISM 11/06/2007  . HYPERLIPIDEMIA 11/03/2010  . HYPERTENSION 09/13/2007  . ALLERGIC RHINITIS 11/06/2007  . ASTHMA 09/13/2007  . PERIMENOPAUSAL STATUS 09/13/2007  . OSTEOPOROSIS 11/06/2007  . ALCOHOL ABUSE, HX OF 11/06/2007  . COLONIC POLYPS, HX OF 02/09/2008  . INSOMNIA, HX OF 09/13/2007  . Anxiety 04/03/2011  . Stroke   . TIA (transient ischemic attack) 11/04/2011   Past Surgical History  Procedure Date  . Cesarean section     x 3  . Breast surgery 2008 and 2012    x 2 - benign, left side    reports that she has never smoked. She does not have any smokeless tobacco history on file. She reports that she does not drink alcohol or use illicit drugs. family history includes Coronary artery disease in her other; Hyperlipidemia in her other; and Hypertension in her other. Allergies  Allergen Reactions  . Alendronate Sodium  (Fosamax)   . Penicillins    Current Outpatient Prescriptions on File Prior to Visit  Medication Sig Dispense Refill  . acetaminophen (TYLENOL) 325 MG tablet Take 650 mg by mouth every 6 (six) hours as needed.        . Calcium Carbonate-Vitamin D (CALCIUM-VITAMIN D) 500-200 MG-UNIT per tablet Take 1 tablet by mouth daily.        . clopidogrel (PLAVIX) 75 MG tablet Take 1 tablet (75 mg total) by mouth daily.  90 tablet  3   Review of Systems Review of Systems  Constitutional: Negative for diaphoresis and unexpected weight change.  HENT: Negative for drooling and tinnitus.   Eyes: Negative for photophobia and visual disturbance.  Respiratory: Negative for choking and stridor.   Gastrointestinal: Negative for vomiting and blood in stool.  Genitourinary: Negative for hematuria and decreased urine volume.     Objective:   Physical Exam BP 130/90  Pulse 74  Temp(Src) 98.3 F (36.8 C) (Oral)  Ht 4\' 11"  (1.499 m)  Wt 143 lb (64.864 kg)  BMI 28.88 kg/m2  SpO2 95% Physical Exam  VS noted Constitutional: Pt appears well-developed and well-nourished.  HENT: Head: Normocephalic.  Right Ear: External ear normal.  Left Ear: External ear normal.  Mouth; normal except several poor dentition, o/w no mass, ulcer Eyes: Conjunctivae and EOM are normal. Pupils are equal, round, and reactive to light.  Neck: Normal range of motion. Neck supple. right angle of  jaw with < 1 cm nondiscrete firm(not hard) mobile mass, no other LA or mass Cardiovascular: Normal rate and regular rhythm.   Pulmonary/Chest: Effort normal and breath sounds normal.  Abd:  Soft, NT, non-distended, + BS Neurological: Pt is alert. No cranial nerve deficit.  Skin: Skin is warm. No erythema.  Psychiatric: Pt behavior is normal. Thought content normal.     Assessment & Plan:

## 2012-02-04 NOTE — Patient Instructions (Addendum)
Take all new medications as prescribed Continue all other medications as before Please continue to monitor the small knot just behind the "angle of the right jaw"; if enlarging please call for ENT referral You are given the ambien refill Your other refills were sent to the pharmacy

## 2012-02-04 NOTE — Assessment & Plan Note (Signed)
pcn allergic, for Mild to mod, for antibx course,  to f/u any worsening symptoms or concerns

## 2012-02-04 NOTE — Assessment & Plan Note (Signed)
For ambien prn,  to f/u any worsening symptoms or concerns  

## 2012-03-14 ENCOUNTER — Other Ambulatory Visit: Payer: Self-pay | Admitting: Internal Medicine

## 2012-03-14 ENCOUNTER — Other Ambulatory Visit: Payer: Self-pay | Admitting: Obstetrics and Gynecology

## 2012-03-14 DIAGNOSIS — Z1231 Encounter for screening mammogram for malignant neoplasm of breast: Secondary | ICD-10-CM

## 2012-03-31 ENCOUNTER — Telehealth: Payer: Self-pay

## 2012-03-31 MED ORDER — FEXOFENADINE HCL 180 MG PO TABS: 180.0000 mg | ORAL_TABLET | Freq: Every day | ORAL | Status: DC

## 2012-03-31 NOTE — Telephone Encounter (Signed)
Not sure about the cost with her insurance, but I did send in generic allegra for prn use

## 2012-03-31 NOTE — Telephone Encounter (Signed)
Patient cannot receive generic to singulair as PA has not been completed. Please advise if alternative to try

## 2012-03-31 NOTE — Telephone Encounter (Signed)
Called left message to call back 

## 2012-03-31 NOTE — Telephone Encounter (Signed)
The patient is ok to change and does not want the medication to be more than $10.

## 2012-03-31 NOTE — Telephone Encounter (Signed)
I'm not sure what this note [place], as usually PA is needed for brand name meds  Pt would be ok to take generic, or we could rx allegra instead

## 2012-04-01 NOTE — Telephone Encounter (Signed)
Called pt no answer LMOM md response... 4/19/113@9 :07am/LMB

## 2012-04-13 ENCOUNTER — Encounter: Payer: Self-pay | Admitting: Vascular Surgery

## 2012-04-14 ENCOUNTER — Ambulatory Visit: Payer: BC Managed Care – PPO | Admitting: Vascular Surgery

## 2012-05-02 ENCOUNTER — Telehealth: Payer: Self-pay

## 2012-05-02 MED ORDER — MONTELUKAST SODIUM 10 MG PO TABS: 10.0000 mg | ORAL_TABLET | Freq: Every day | ORAL | Status: DC

## 2012-05-02 NOTE — Telephone Encounter (Signed)
Pt called stating there was a miscommunication on her Singulair Rx (see note 03/31/2012). Pt is requesting Rx be sent to pharmacy for generic only. Rx sent, pt aware.

## 2012-05-03 ENCOUNTER — Telehealth: Payer: Self-pay

## 2012-05-03 NOTE — Telephone Encounter (Signed)
Called to get PA for Montelukast Sodium 10 mg. Medication has been approved from 04/12/12 through 05/03/13. Form of approval has been faxed. Faxed form to pharmacy and called pharmacy as well to inform.  Called the patient left message medication has been approved.

## 2012-05-04 ENCOUNTER — Encounter: Payer: Self-pay | Admitting: Neurosurgery

## 2012-05-05 ENCOUNTER — Encounter: Payer: Self-pay | Admitting: Neurosurgery

## 2012-05-05 ENCOUNTER — Ambulatory Visit (INDEPENDENT_AMBULATORY_CARE_PROVIDER_SITE_OTHER): Payer: BC Managed Care – PPO | Admitting: Neurosurgery

## 2012-05-05 ENCOUNTER — Other Ambulatory Visit (INDEPENDENT_AMBULATORY_CARE_PROVIDER_SITE_OTHER): Payer: BC Managed Care – PPO | Admitting: *Deleted

## 2012-05-05 VITALS — BP 136/86 | HR 70 | Resp 14 | Ht 59.0 in | Wt 134.0 lb

## 2012-05-05 DIAGNOSIS — I6529 Occlusion and stenosis of unspecified carotid artery: Secondary | ICD-10-CM

## 2012-05-05 DIAGNOSIS — Z48812 Encounter for surgical aftercare following surgery on the circulatory system: Secondary | ICD-10-CM

## 2012-05-05 DIAGNOSIS — I6523 Occlusion and stenosis of bilateral carotid arteries: Secondary | ICD-10-CM | POA: Insufficient documentation

## 2012-05-05 DIAGNOSIS — I6522 Occlusion and stenosis of left carotid artery: Secondary | ICD-10-CM | POA: Insufficient documentation

## 2012-05-05 NOTE — Progress Notes (Signed)
[facility] HISTORY AND PHYSICAL   CC: Annual carotid duplex status post left CEA in March 2012 Referring Physician: Darrick Penna  History of Present Illness: 61 year old female patient of Dr.Fields seen for known carotid stenosis. Patient had a stroke with subsequent left CEA in March 2012 and since that time has been asymptomatic. She reports no recent signs or symptoms of CVA, TIA, amaurosis fugax or neural deficits.  Past Medical History  Diagnosis Date  . HYPOTHYROIDISM 11/06/2007  . HYPERLIPIDEMIA 11/03/2010  . HYPERTENSION 09/13/2007  . ALLERGIC RHINITIS 11/06/2007  . ASTHMA 09/13/2007  . PERIMENOPAUSAL STATUS 09/13/2007  . OSTEOPOROSIS 11/06/2007  . ALCOHOL ABUSE, HX OF 11/06/2007  . COLONIC POLYPS, HX OF 02/09/2008  . INSOMNIA, HX OF 09/13/2007  . Anxiety 04/03/2011  . Stroke   . TIA (transient ischemic attack) 11/04/2011    ROS: [x]  Positive   [ ]  Denies    General: [ ]  Weight loss, [ ]  Fever, [ ]  chills Neurologic: [ ]  Dizziness, [ ]  Blackouts, [ ]  Seizure [ ]  Stroke, [ ]  "Mini stroke", [ ]  Slurred speech, [ ]  Temporary blindness; [ ]  weakness in arms or legs, [ ]  Hoarseness Cardiac: [ ]  Chest pain/pressure, [ ]  Shortness of breath at rest [ ]  Shortness of breath with exertion, [ ]  Atrial fibrillation or irregular heartbeat Vascular: [ ]  Pain in legs with walking, [ ]  Pain in legs at rest, [ ]  Pain in legs at night,  [ ]  Non-healing ulcer, [ ]  Blood clot in vein/DVT,   Pulmonary: [ ]  Home oxygen, [ ]  Productive cough, [ ]  Coughing up blood, [ ]  Asthma,  [ ]  Wheezing Musculoskeletal:  [ ]  Arthritis, [ ]  Low back pain, [ ]  Joint pain Hematologic: [ ]  Easy Bruising, [ ]  Anemia; [ ]  Hepatitis Gastrointestinal: [ ]  Blood in stool, [ ]  Gastroesophageal Reflux/heartburn, [ ]  Trouble swallowing Urinary: [ ]  chronic Kidney disease, [ ]  on HD - [ ]  MWF or [ ]  TTHS, [ ]  Burning with urination, [ ]  Difficulty urinating Skin: [ ]  Rashes, [ ]   Wounds Psychological: [ ]  Anxiety, [ ]  Depression   Social History History  Substance Use Topics  . Smoking status: Never Smoker   . Smokeless tobacco: Not on file  . Alcohol Use: No    Family History Family History  Problem Relation Age of Onset  . Hyperlipidemia Other   . Hypertension Other   . Coronary artery disease Other     Allergies  Allergen Reactions  . Alendronate Sodium (Alendronate Sodium)   . Penicillins     Current Outpatient Prescriptions  Medication Sig Dispense Refill  . acetaminophen (TYLENOL) 325 MG tablet Take 650 mg by mouth every 6 (six) hours as needed.        Marland Kitchen amLODipine-benazepril (LOTREL) 5-40 MG per capsule Take 1 capsule by mouth daily.  90 capsule  3  . atorvastatin (LIPITOR) 20 MG tablet Take 1 tablet (20 mg total) by mouth daily.  90 tablet  3  . Calcium Carbonate-Vitamin D (CALCIUM-VITAMIN D) 500-200 MG-UNIT per tablet Take 1 tablet by mouth daily.        . clopidogrel (PLAVIX) 75 MG tablet Take 1 tablet (75 mg total) by mouth daily.  90 tablet  3  . Fluticasone-Salmeterol (ADVAIR DISKUS) 250-50 MCG/DOSE AEPB Inhale 1 puff into the lungs 2 (two) times daily.  60 each  11  . levalbuterol (XOPENEX HFA) 45 MCG/ACT inhaler Inhale 2 puffs into the lungs  every 6 (six) hours as needed for wheezing.  1 Inhaler  11  . montelukast (SINGULAIR) 10 MG tablet Take 1 tablet (10 mg total) by mouth daily. Generic only  90 tablet  3  . zolpidem (AMBIEN) 10 MG tablet Take 1 tablet (10 mg total) by mouth daily as needed.  30 tablet  5  . fexofenadine (ALLEGRA) 180 MG tablet Take 1 tablet (180 mg total) by mouth daily.  30 tablet  11    Physical Examination  Filed Vitals:   05/05/12 1548  BP: 136/86  Pulse: 70  Resp: 14    Body mass index is 27.06 kg/(m^2).  General:  WDWN in NAD Gait: Normal HEENT: WNL Eyes: Pupils equal Pulmonary: normal non-labored breathing , without Rales, rhonchi,  wheezing Cardiac: RRR, without  Murmurs, rubs or  gallops; Abdomen: soft, NT, no masses Skin: no rashes, ulcers noted  Vascular Exam Pulses: 2+ radial pulses Carotid bruits Olen carotid pulses to auscultation no bruits present Extremities without ischemic changes, no Gangrene , no cellulitis; no open wounds;  Musculoskeletal: no muscle wasting or atrophy   Neurologic: A&O X 3; Appropriate Affect ; SENSATION: normal; MOTOR FUNCTION:  moving all extremities equally. Speech is fluent/normal  Non-Invasive Vascular Imaging CAROTID DUPLEX 05/05/2012  Right ICA 40 - 59 % stenosis Left ICA 20 - 39 % stenosis   ASSESSMENT/PLAN: Asymptomatic carotid stenosis with a history of left CEA March 2012. The patient will followup here in one year with repeat carotid duplex exam, she is in agreement with this. The patient's questions were encouraged and answered. Patient also knows to go to the nearest emergency room should she experience the signs and symptoms of CVA.  Lauree Chandler ANP   Clinic MD: Darrick Penna

## 2012-05-06 NOTE — Progress Notes (Signed)
Addended by: Sharee Pimple on: 05/06/2012 11:29 AM   Modules accepted: Orders

## 2012-05-13 NOTE — Procedures (Unsigned)
CAROTID DUPLEX EXAM  INDICATION:  Follow-up CEA.  HISTORY: Diabetes:  No. Cardiac:  No. Hypertension:  Yes. Smoking:  Previous. Previous Surgery:  Left CEA, 03/09/11. CV History:  CVA, 02/2011. Amaurosis Fugax No, Paresthesias No, Hemiparesis No.                                      RIGHT             LEFT Brachial systolic pressure:         168               168 Brachial Doppler waveforms:         WNL               WNL Vertebral direction of flow:        Antegrade         Bunny rabbit DUPLEX VELOCITIES (cm/sec) CCA peak systolic                   86                101 ECA peak systolic                   85                77 ICA peak systolic                   133               58 (P); 134 (M) ICA end diastolic                   50                28 (P); 58 (M) PLAQUE MORPHOLOGY:                  Heterogenous PLAQUE AMOUNT:                      Moderate          None PLAQUE LOCATION:                    CCA/ICA  IMPRESSION: 1. 40% to 59% right internal carotid artery stenosis. 2. Widely patent left carotid endarterectomy without evidence of     restenosis or hyperplasia.  There are elevated velocities at the     distal patch site, likely due to caliber change and vessel     tortuosity.  No disease is observed. 3. Right vertebral artery is within normal limits. 4. Left vertebral artery displays mid systolic deceleration in the     waveform without evidence of subclavian stenosis.  ___________________________________________ Janetta Hora Fields, MD  LT/MEDQ  D:  05/05/2012  T:  05/05/2012  Job:  [id number]

## 2012-05-27 ENCOUNTER — Inpatient Hospital Stay: Admission: RE | Admit: 2012-05-27 | Payer: BC Managed Care – PPO | Source: Ambulatory Visit

## 2012-05-27 ENCOUNTER — Ambulatory Visit
Admission: RE | Admit: 2012-05-27 | Discharge: 2012-05-27 | Disposition: A | Discharge status: Home / Self Care | Payer: BC Managed Care – PPO | Source: Ambulatory Visit | Attending: Internal Medicine | Admitting: Internal Medicine

## 2012-05-27 DIAGNOSIS — Z1231 Encounter for screening mammogram for malignant neoplasm of breast: Secondary | ICD-10-CM

## 2012-06-22 ENCOUNTER — Encounter (INDEPENDENT_AMBULATORY_CARE_PROVIDER_SITE_OTHER): Payer: Self-pay

## 2012-10-07 ENCOUNTER — Encounter: Payer: Self-pay | Admitting: Internal Medicine

## 2012-10-10 ENCOUNTER — Other Ambulatory Visit: Payer: Self-pay

## 2012-10-10 MED ORDER — ZOLPIDEM TARTRATE 10 MG PO TABS: 10.0000 mg | ORAL_TABLET | Freq: Every day | ORAL | Status: DC | PRN

## 2012-10-10 NOTE — Telephone Encounter (Signed)
Faxed hardcopy to [facility]  

## 2012-10-10 NOTE — Telephone Encounter (Signed)
Done hardcopy to robin  

## 2012-10-19 ENCOUNTER — Telehealth: Payer: Self-pay

## 2012-10-19 NOTE — Telephone Encounter (Signed)
PA for Zolpidem has been approved from 09/27/12 through 10/18/13.  Pharmacy and patient both have been informed of approval.  Case [id number].

## 2012-11-06 ENCOUNTER — Encounter: Payer: Self-pay | Admitting: Internal Medicine

## 2012-11-07 ENCOUNTER — Encounter: Payer: Self-pay | Admitting: Internal Medicine

## 2012-11-11 ENCOUNTER — Other Ambulatory Visit: Payer: Self-pay | Admitting: *Deleted

## 2012-11-11 MED ORDER — CLOPIDOGREL BISULFATE 75 MG PO TABS: 75.0000 mg | ORAL_TABLET | Freq: Every day | ORAL | Status: DC

## 2012-11-11 NOTE — Telephone Encounter (Signed)
R'cd fax from [facility] of Plavix.

## 2012-11-14 ENCOUNTER — Encounter: Payer: Self-pay | Admitting: Internal Medicine

## 2012-12-05 ENCOUNTER — Telehealth: Payer: Self-pay

## 2012-12-05 DIAGNOSIS — Z0000 Encounter for general adult medical examination without abnormal findings: Secondary | ICD-10-CM

## 2012-12-05 NOTE — Telephone Encounter (Signed)
Labs done.

## 2012-12-15 ENCOUNTER — Ambulatory Visit: Payer: BC Managed Care – PPO | Admitting: Internal Medicine

## 2012-12-15 ENCOUNTER — Telehealth: Payer: Self-pay | Admitting: Internal Medicine

## 2012-12-15 NOTE — Telephone Encounter (Signed)
No charge. 

## 2012-12-15 NOTE — Telephone Encounter (Signed)
No charge per Dr. Gessner. 

## 2013-01-31 ENCOUNTER — Encounter: Payer: Self-pay | Admitting: Internal Medicine

## 2013-01-31 ENCOUNTER — Ambulatory Visit (INDEPENDENT_AMBULATORY_CARE_PROVIDER_SITE_OTHER): Payer: BC Managed Care – PPO | Admitting: Internal Medicine

## 2013-01-31 VITALS — BP 142/88 | HR 80 | Temp 98.0°F | Wt 145.0 lb

## 2013-01-31 DIAGNOSIS — Z0000 Encounter for general adult medical examination without abnormal findings: Secondary | ICD-10-CM

## 2013-01-31 DIAGNOSIS — I10 Essential (primary) hypertension: Secondary | ICD-10-CM

## 2013-01-31 MED ORDER — LEVALBUTEROL TARTRATE 45 MCG/ACT IN AERO: 2.0000 | INHALATION_SPRAY | Freq: Four times a day (QID) | RESPIRATORY_TRACT | Status: DC | PRN

## 2013-01-31 MED ORDER — MONTELUKAST SODIUM 10 MG PO TABS: 10.0000 mg | ORAL_TABLET | Freq: Every day | ORAL | Status: DC

## 2013-01-31 MED ORDER — FLUTICASONE-SALMETEROL 250-50 MCG/DOSE IN AEPB: 1.0000 | INHALATION_SPRAY | Freq: Two times a day (BID) | RESPIRATORY_TRACT | Status: DC

## 2013-01-31 MED ORDER — FEXOFENADINE HCL 180 MG PO TABS: 180.0000 mg | ORAL_TABLET | Freq: Every day | ORAL | Status: DC

## 2013-01-31 MED ORDER — ZOLPIDEM TARTRATE 10 MG PO TABS: 10.0000 mg | ORAL_TABLET | Freq: Every day | ORAL | Status: DC | PRN

## 2013-01-31 MED ORDER — CLOPIDOGREL BISULFATE 75 MG PO TABS: 75.0000 mg | ORAL_TABLET | Freq: Every day | ORAL | Status: DC

## 2013-01-31 MED ORDER — ATORVASTATIN CALCIUM 20 MG PO TABS: 20.0000 mg | ORAL_TABLET | Freq: Every day | ORAL | Status: DC

## 2013-01-31 MED ORDER — AMLODIPINE BESY-BENAZEPRIL HCL 5-40 MG PO CAPS: 1.0000 | ORAL_CAPSULE | Freq: Every day | ORAL | Status: DC

## 2013-01-31 NOTE — Patient Instructions (Addendum)
We will call for the results from Dr Tedra [place] office Please continue all other medications as before, and refills have been done if requested - the ambien and xopenex today, and the others will be done later today You will be contacted regarding the referral for: colonoscopy Your EKG was good today Please have the pharmacy call with any other refills you may need. Please continue your efforts at being more active, low cholesterol diet, and weight control. You are otherwise up to date with prevention measures today. Thank you for enrolling in MyChart. Please follow the instructions below to securely access your online medical record. MyChart allows you to send messages to your doctor, view your test results, renew your prescriptions, schedule appointments, and more. To Log into My Chart online, please go by [employer] or [employer] to [email or web address], or download the MyChart App from the [employer] of [employer] .  Your Username [email or web address] Please return in 6 months, or sooner if needed

## 2013-01-31 NOTE — Assessment & Plan Note (Addendum)
Overall doing well, age appropriate education and counseling updated, referrals for preventative services and immunizations addressed, dietary and smoking counseling addressed, most recent labs reviewed.  I have personally reviewed and have noted: 1) the patient's medical and social history 2) The pt's use of alcohol, tobacco, and illicit drugs 3) The patient's current medications and supplements 4) Functional ability including ADL's, fall risk, home safety risk, hearing and visual impairment 5) Diet and physical activities 6) Evidence for depression or mood disorder 7) The patient's height, weight, and BMI have been recorded in the chart I have made referrals, and provided counseling and education based on review of the above ECG reviewed as per emr Will ask for recent labs per Dr Tedra [place] office per pt request

## 2013-02-01 NOTE — Assessment & Plan Note (Signed)
stable overall by history and exam, recent data reviewed with pt, and pt to continue medical treatment as before,  to f/u any worsening symptoms or concerns BP Readings from Last 3 Encounters:  01/31/13 142/88  05/05/12 136/86  02/04/12 130/90

## 2013-02-01 NOTE — Progress Notes (Signed)
Subjective:    Patient ID: [name] [name], female    DOB: August 10, 1951, 62 y.o.   MRN: [id number]  HPI  Here for wellness and f/u;  Overall doing ok;  Pt denies CP, worsening SOB, DOE, wheezing, orthopnea, PND, worsening LE edema, palpitations, dizziness or syncope.  Pt denies neurological change such as new headache, facial or extremity weakness.  Pt denies polydipsia, polyuria, or low sugar symptoms. Pt states overall good compliance with treatment and medications, good tolerability, and has been trying to follow lower cholesterol diet.  Pt denies worsening depressive symptoms, suicidal ideation or panic. No fever, night sweats, wt loss, loss of appetite, or other constitutional symptoms.  Pt states good ability with ADL's, has low fall risk, home safety reviewed and adequate, no other significant changes in hearing or vision, and only occasionally active with exercise.  Did have recent labs per Dr Parke Simmers, wants to avoid further labs if ok.  Needs med refills, due for colonoscopy Past Medical History  Diagnosis Date  . HYPOTHYROIDISM 11/06/2007  . HYPERLIPIDEMIA 11/03/2010  . HYPERTENSION 09/13/2007  . ALLERGIC RHINITIS 11/06/2007  . ASTHMA 09/13/2007  . PERIMENOPAUSAL STATUS 09/13/2007  . OSTEOPOROSIS 11/06/2007  . ALCOHOL ABUSE, HX OF 11/06/2007  . COLONIC POLYPS, HX OF 02/09/2008  . INSOMNIA, HX OF 09/13/2007  . Anxiety 04/03/2011  . Stroke   . TIA (transient ischemic attack) 11/04/2011   Past Surgical History  Procedure Laterality Date  . Cesarean section      x 3  . Breast surgery  2008 and 2012    x 2 - benign, left side  . Colonoscopy  multiple    2010    reports that she has never smoked. She does not have any smokeless tobacco history on file. She reports that she does not drink alcohol or use illicit drugs. family history includes Coronary artery disease in her other; Hyperlipidemia in her other; and Hypertension in her other. Allergies  Allergen Reactions  . Alendronate Sodium  (Alendronate Sodium)   . Penicillins    Current Outpatient Prescriptions on File Prior to Visit  Medication Sig Dispense Refill  . acetaminophen (TYLENOL) 325 MG tablet Take 650 mg by mouth every 6 (six) hours as needed.        . Calcium Carbonate-Vitamin D (CALCIUM-VITAMIN D) 500-200 MG-UNIT per tablet Take 1 tablet by mouth daily.         No current facility-administered medications on file prior to visit.   Review of Systems Constitutional: Negative for diaphoresis, activity change, appetite change or unexpected weight change.  HENT: Negative for hearing loss, ear pain, facial swelling, mouth sores and neck stiffness.   Eyes: Negative for pain, redness and visual disturbance.  Respiratory: Negative for shortness of breath and wheezing.   Cardiovascular: Negative for chest pain and palpitations.  Gastrointestinal: Negative for diarrhea, blood in stool, abdominal distention or other pain Genitourinary: Negative for hematuria, flank pain or change in urine volume.  Musculoskeletal: Negative for myalgias and joint swelling.  Skin: Negative for color change and wound.  Neurological: Negative for syncope and numbness. other than noted Hematological: Negative for adenopathy.  Psychiatric/Behavioral: Negative for hallucinations, self-injury, decreased concentration and agitation.      Objective:   Physical Exam BP 142/88  Pulse 80  Temp(Src) 98 F (36.7 C) (Oral)  Wt 145 lb (65.772 kg)  BMI 29.27 kg/m2  SpO2 95% VS noted,  Constitutional: Pt is oriented to person, place, and time. Appears well-developed and well-nourished.  Head: Normocephalic  and atraumatic.  Right Ear: External ear normal.  Left Ear: External ear normal.  Nose: Nose normal.  Mouth/Throat: Oropharynx is clear and moist.  Eyes: Conjunctivae and EOM are normal. Pupils are equal, round, and reactive to light.  Neck: Normal range of motion. Neck supple. No JVD present. No tracheal deviation present.   Cardiovascular: Normal rate, regular rhythm, normal heart sounds and intact distal pulses.   Pulmonary/Chest: Effort normal and breath sounds normal.  Abdominal: Soft. Bowel sounds are normal. There is no tenderness. No HSM  Musculoskeletal: Normal range of motion. Exhibits no edema.  Lymphadenopathy:  Has no cervical adenopathy.  Neurological: Pt is alert and oriented to person, place, and time. Pt has normal reflexes. No cranial nerve deficit.  Skin: Skin is warm and dry. No rash noted.  Psychiatric:  Has  normal mood and affect. Behavior is normal.     Assessment & Plan:

## 2013-02-13 ENCOUNTER — Encounter: Payer: Self-pay | Admitting: Internal Medicine

## 2013-02-14 ENCOUNTER — Encounter: Payer: Self-pay | Admitting: Internal Medicine

## 2013-03-09 ENCOUNTER — Ambulatory Visit (AMBULATORY_SURGERY_CENTER): Payer: BC Managed Care – PPO | Admitting: *Deleted

## 2013-03-09 ENCOUNTER — Encounter: Payer: Self-pay | Admitting: *Deleted

## 2013-03-09 VITALS — Ht 59.0 in | Wt 143.4 lb

## 2013-03-09 DIAGNOSIS — Z1211 Encounter for screening for malignant neoplasm of colon: Secondary | ICD-10-CM

## 2013-03-09 MED ORDER — NA SULFATE-K SULFATE-MG SULF 17.5-3.13-1.6 GM/177ML PO SOLN
ORAL | Status: DC
Start: 2013-03-09 — End: 2013-03-23

## 2013-03-09 NOTE — Progress Notes (Signed)
Patient ID: [name] [name], female   DOB: Mar 22, 1951, 61 y.o.   MRN: [id number] Pt here for PV for recall colon scheduled 03/23/13 with Dr. Leone Payor.  Pt takes Plavix for history TIA's.  Pt given instructions for procedure and scheduled for OV with Doug Sou on Monday 3/31 at 8:30 am.

## 2013-03-10 ENCOUNTER — Encounter: Payer: Self-pay | Admitting: Internal Medicine

## 2013-03-10 ENCOUNTER — Encounter: Payer: Self-pay | Admitting: *Deleted

## 2013-03-13 ENCOUNTER — Ambulatory Visit (INDEPENDENT_AMBULATORY_CARE_PROVIDER_SITE_OTHER): Payer: BC Managed Care – PPO | Admitting: Gastroenterology

## 2013-03-13 ENCOUNTER — Telehealth: Payer: Self-pay | Admitting: *Deleted

## 2013-03-13 ENCOUNTER — Encounter: Payer: Self-pay | Admitting: Gastroenterology

## 2013-03-13 VITALS — BP 116/74 | HR 88 | Ht 58.5 in | Wt 146.4 lb

## 2013-03-13 DIAGNOSIS — Z86010 Personal history of colonic polyps: Secondary | ICD-10-CM

## 2013-03-13 NOTE — Telephone Encounter (Signed)
Left message on patients home phone and cell phone to call office back

## 2013-03-13 NOTE — Patient Instructions (Signed)
  Please keep your scheduled appointment for your Colonoscopy.  Clearance letter for your Plavix will be sent to Dr. Jonny Ruiz, please call our office a week from your scheduled procedure date about holding your Plavix if you do not hear from [place]. _____________________________________________________________________________________________________________________                                               We are excited to introduce MyChart, a new best-in-class service that provides you online access to important information in your electronic medical record. We want to make it easier for you to view your health information - all in one secure location - when and where you need it. We expect MyChart will enhance the quality of care and service we provide.  When you register for MyChart, you can:    View your test results.    Request appointments and receive appointment reminders via email.    Request medication renewals.    View your medical history, allergies, medications and immunizations.    Communicate with your physician's office through a password-protected site.    Conveniently print information such as your medication lists.  To find out if MyChart is right for you, please talk to a member of our clinical staff today. We will gladly answer your questions about this free health and wellness tool.  If you are age 45 or older and want a member of your family to have access to your record, you must provide written consent by completing a proxy form available at our office. Please speak to our clinical staff about guidelines regarding accounts for patients younger than age 66.  As you activate your MyChart account and need any technical assistance, please call the MyChart technical support line at [phone]) or email your question to [email or web address]. If you email your question(s), please include your name, a return phone number and the best time to reach  you.  If you have non-urgent health-related questions, you can send a message to our office through MyChart at [email or web address]. If you have a medical emergency, call 911.  Thank you for using MyChart as your new health and wellness resource!   MyChart licensed from [employer],  4540-9811. Patents Pending.

## 2013-03-13 NOTE — Telephone Encounter (Signed)
03/13/2013    RE: [name] [name] DOB: 1951/05/05 MRN: [id number]   Dear Dr. Jonny Ruiz,    We have scheduled the above patient for an endoscopic procedure. Our records show that she is on anticoagulation therapy.   Please advise as to how long the patient may come off her therapy of Plavix prior to the procedure, which is scheduled for 03-23-2013.  Please fax back/ or route the completed form to Landry Dyke. CMA at [phone].   Sincerely,  Ok Anis

## 2013-03-13 NOTE — Progress Notes (Signed)
03/13/2013 [name] [name] [id number] 06/23/1951   HISTORY OF PRESENT ILLNESS:  Patient is a 62 year old female who presents to our office today for review prior to her surveillance colonoscopy.  Her last procedure was in 11/2009 at which time she was found to have 3 polyps in the sigmoid colon, 2 mm-12 mm in size, which were adenomatous polyps; it was recommended that she have a repeat colonoscopy in 3 years from that time.  She also had mild diverticulosis and hemorrhoids at that time.  She is already scheduled for her colonoscopy on 4/10, however, she is on plavix, therefore, she needed to be seen in our office so we can contact her PCP for clearance to come off of her Plavix.  She does not have any GI complaints at this time.  No other questions about her procedure.  Past Medical History  Diagnosis Date  . HYPOTHYROIDISM 11/06/2007  . HYPERLIPIDEMIA 11/03/2010  . HYPERTENSION 09/13/2007  . ALLERGIC RHINITIS 11/06/2007  . ASTHMA 09/13/2007  . PERIMENOPAUSAL STATUS 09/13/2007  . OSTEOPOROSIS 11/06/2007  . ALCOHOL ABUSE, HX OF 11/06/2007  . COLONIC POLYPS, HX OF 02/09/2008     ADENOMATOUS POLYP  . INSOMNIA, HX OF 09/13/2007  . Anxiety 04/03/2011  . Stroke   . TIA (transient ischemic attack) 11/04/2011   Past Surgical History  Procedure Laterality Date  . Cesarean section      x 3  . Breast surgery  2008 and 2012    x 2 - benign, left side  . Colonoscopy  multiple    2010  . Carotid artery surgery Left     reports that she has never smoked. She has never used smokeless tobacco. She reports that she does not drink alcohol or use illicit drugs. family history includes Coronary artery disease in her other; Hyperlipidemia in her other; and Hypertension in her other.  There is no history of Colon cancer. Allergies  Allergen Reactions  . Fosamax (Alendronate Sodium) Nausea And Vomiting  . Penicillins Hives      Outpatient Encounter Prescriptions as of 03/13/2013  Medication Sig  Dispense Refill  . acetaminophen (TYLENOL) 325 MG tablet Take 650 mg by mouth every 6 (six) hours as needed.        Marland Kitchen amLODipine-benazepril (LOTREL) 5-40 MG per capsule Take 1 capsule by mouth daily.  90 capsule  3  . atorvastatin (LIPITOR) 20 MG tablet Take 1 tablet (20 mg total) by mouth daily.  90 tablet  3  . Calcium Carbonate-Vitamin D (CALCIUM-VITAMIN D) 500-200 MG-UNIT per tablet Take 1 tablet by mouth daily.        . clopidogrel (PLAVIX) 75 MG tablet Take 1 tablet (75 mg total) by mouth daily.  90 tablet  3  . fexofenadine (ALLEGRA) 180 MG tablet Take 1 tablet (180 mg total) by mouth daily.  90 tablet  3  . Fluticasone-Salmeterol (ADVAIR DISKUS) 250-50 MCG/DOSE AEPB Inhale 1 puff into the lungs 2 (two) times daily.  60 each  11  . levalbuterol (XOPENEX HFA) 45 MCG/ACT inhaler Inhale 2 puffs into the lungs every 6 (six) hours as needed for wheezing.  1 Inhaler  11  . montelukast (SINGULAIR) 10 MG tablet Take 1 tablet (10 mg total) by mouth daily. Generic only  90 tablet  3  . Na Sulfate-K Sulfate-Mg Sulf (SUPREP BOWEL PREP) SOLN suprep as directed.  No substitutions  354 mL  0  . zolpidem (AMBIEN) 10 MG tablet Take 1 tablet (10 mg total) by mouth  daily as needed.  30 tablet  5   No facility-administered encounter medications on file as of 03/13/2013.     REVIEW OF SYSTEMS  : All other systems reviewed and negative except where noted in the History of Present Illness.   PHYSICAL EXAM: BP 116/74  Pulse 88  Ht 4' 10.5" (1.486 m)  Wt 146 lb 7 oz (66.424 kg)  BMI 30.08 kg/m2 General: Well developed black female in no acute distress Head: Normocephalic and atraumatic Eyes:  sclerae anicteric,conjunctive pink Ears: Normal auditory acuity Lungs: Clear throughout to auscultation Heart: Regular rate and rhythm Abdomen: Soft, nontender, non-distended. No masses or hepatomegaly noted. Normal bowel sounds. Rectal: Deferred.  Will be performed at the time of colonoscopy. Musculoskeletal:  Symmetrical with no gross deformities  Skin: No lesions on visible extremities Extremities: No edema  Neurological: Alert oriented x 4, grossly nonfocal Psychological:  Alert and cooperative. Normal mood and affect  ASSESSMENT AND PLAN: -History of colonic polyps:  Last colonoscopy 11/2009 with adenomatous polyps and repeat recommended in 3 years from that time.  Colonoscopy already scheduled for 4/10 and instructions given.  Will contact PCP for clearance to stop Plavix.

## 2013-03-13 NOTE — Telephone Encounter (Signed)
Ok for off plavix x 5 days prior 

## 2013-03-15 NOTE — Progress Notes (Signed)
Agree with management.  Carl E. Gessner, MD, FACG  

## 2013-03-16 ENCOUNTER — Telehealth: Payer: Self-pay | Admitting: *Deleted

## 2013-03-16 NOTE — Telephone Encounter (Signed)
Called back left msg colonoscopy is schedule for 03/23/13...lmb

## 2013-03-16 NOTE — Telephone Encounter (Signed)
Left msg on vm stating needing to know the date to stop taking her blood thinner so she can have colonoscopy...[name] [name]

## 2013-03-16 NOTE — Telephone Encounter (Signed)
Ok for off 5 days prior to procedure (the plavix , not coumadin)  Please forward to GI as well

## 2013-03-17 NOTE — Telephone Encounter (Signed)
Notified pt with md response.../lmb 

## 2013-03-23 ENCOUNTER — Ambulatory Visit (AMBULATORY_SURGERY_CENTER): Payer: BC Managed Care – PPO | Admitting: Internal Medicine

## 2013-03-23 ENCOUNTER — Encounter: Payer: Self-pay | Admitting: Internal Medicine

## 2013-03-23 VITALS — BP 118/64 | HR 65 | Temp 98.7°F | Resp 24 | Ht 59.5 in | Wt 146.0 lb

## 2013-03-23 DIAGNOSIS — Z1211 Encounter for screening for malignant neoplasm of colon: Secondary | ICD-10-CM

## 2013-03-23 DIAGNOSIS — D126 Benign neoplasm of colon, unspecified: Secondary | ICD-10-CM

## 2013-03-23 DIAGNOSIS — Z86010 Personal history of colonic polyps: Secondary | ICD-10-CM

## 2013-03-23 DIAGNOSIS — K5730 Diverticulosis of large intestine without perforation or abscess without bleeding: Secondary | ICD-10-CM

## 2013-03-23 DIAGNOSIS — K648 Other hemorrhoids: Secondary | ICD-10-CM

## 2013-03-23 MED ORDER — SODIUM CHLORIDE 0.9 % IV SOLN: 500.0000 mL | INTRAVENOUS | Status: DC

## 2013-03-23 NOTE — Progress Notes (Signed)
Called to room to assist during endoscopic procedure.  Patient ID and intended procedure confirmed with present staff. Received instructions for my participation in the procedure from the performing physician. ewm 

## 2013-03-23 NOTE — Patient Instructions (Addendum)
I found and removed one very small polyp. I suspect I will tell you to come back in 5 years for a repeat colonoscopy.  You also have diverticulosis and hemorrhoids.  I am starting a procedure to fix hemorrhoids soon and would like to contact you about that if ok - will discuss.  I will let you know pathology results and when to have another routine colonoscopy by mail.  Thank you for choosing me and [facility].  Iva Boop, MD, FACG   YOU HAD AN ENDOSCOPIC PROCEDURE TODAY AT THE [facility]: Refer to the procedure report that was given to you for any specific questions about what was found during the examination.  If the procedure report does not answer your questions, please call your gastroenterologist to clarify.  If you requested that your care partner not be given the details of your procedure findings, then the procedure report has been included in a sealed envelope for you to review at your convenience later.  YOU SHOULD EXPECT: Some feelings of bloating in the abdomen. Passage of more gas than usual.  Walking can help get rid of the air that was put into your GI tract during the procedure and reduce the bloating. If you had a lower endoscopy (such as a colonoscopy or flexible sigmoidoscopy) you may notice spotting of blood in your stool or on the toilet paper. If you underwent a bowel prep for your procedure, then you may not have a normal bowel movement for a few days.  DIET: Your first meal following the procedure should be a light meal and then it is ok to progress to your normal diet.  A half-sandwich or bowl of soup is an example of a good first meal.  Heavy or fried foods are harder to digest and may make you feel nauseous or bloated.  Likewise meals heavy in dairy and vegetables can cause extra gas to form and this can also increase the bloating.  Drink plenty of fluids but you should avoid alcoholic beverages for 24 hours.  ACTIVITY: Your care  partner should take you home directly after the procedure.  You should plan to take it easy, moving slowly for the rest of the day.  You can resume normal activity the day after the procedure however you should NOT DRIVE or use heavy machinery for 24 hours (because of the sedation medicines used during the test).    SYMPTOMS TO REPORT IMMEDIATELY: A gastroenterologist can be reached at any hour.  During normal business hours, 8:30 AM to 5:00 PM Monday through Friday, call [phone].  After hours and on weekends, please call the GI answering service at [phone] who will take a message and have the physician on call contact you.   Following lower endoscopy (colonoscopy or flexible sigmoidoscopy):  Excessive amounts of blood in the stool  Significant tenderness or worsening of abdominal pains  Swelling of the abdomen that is new, acute  Fever of 100F or higher  FOLLOW UP: If any biopsies were taken you will be contacted by phone or by letter within the next 1-3 weeks.  Call your gastroenterologist if you have not heard about the biopsies in 3 weeks.  Our staff will call the home number listed on your records the next business day following your procedure to check on you and address any questions or concerns that you may have at that time regarding the information given to you following your procedure. This is a courtesy call  and so if there is no answer at the home number and we have not heard from you through the emergency physician on call, we will assume that you have returned to your regular daily activities without incident.  SIGNATURES/CONFIDENTIALITY: You and/or your care partner have signed paperwork which will be entered into your electronic medical record.  These signatures attest to the fact that that the information above on your After Visit Summary has been reviewed and is understood.  Full responsibility of the confidentiality of this discharge information lies with you and/or  your care-partner.

## 2013-03-23 NOTE — Op Note (Signed)
[facility] [address] [place], [address]   COLONOSCOPY PROCEDURE REPORT  PATIENT: [name], [name] Clowdus  MR#: [id number] BIRTHDATE: [birth year] , 61  yrs. old GENDER: Female ENDOSCOPIST: Iva Boop, MD, [facility] PROCEDURE DATE:  03/23/2013 PROCEDURE:   Colonoscopy with snare polypectomy ASA CLASS:   Class III INDICATIONS:Screening and surveillance,personal history of colonic polyps. MEDICATIONS: propofol (Diprivan) 200mg  IV, MAC sedation, administered by CRNA, and These medications were titrated to patient response per physician's verbal order  DESCRIPTION OF PROCEDURE:   After the risks benefits and alternatives of the procedure were thoroughly explained, informed consent was obtained.  A digital rectal exam revealed no abnormalities of the rectum.   The LB [id number]  endoscope was introduced through the anus and advanced to the cecum, which was identified by both the appendix and ileocecal valve. No adverse events experienced.   The quality of the prep was Suprep excellent The instrument was then slowly withdrawn as the colon was fully examined.     COLON FINDINGS: A sessile polyp measuring 5 mm in size was found in the descending colon.  A polypectomy was performed with a cold snare.  The resection was complete and the polyp tissue was completely retrieved.   Moderate diverticulosis was noted in the sigmoid colon.   There was mild scattered diverticulosis noted in the right colon.   Small internal and external hemorrhoids were found.   The colon mucosa was otherwise normal.   A right colon retroflexion was performed.  Retroflexed views revealed internal/external hemorrhoids. The time to cecum=1 minutes 34 seconds.  Withdrawal time=7 minutes 14 seconds.  The scope was withdrawn and the procedure completed. COMPLICATIONS: There were no complications.  ENDOSCOPIC IMPRESSION: 1.   Sessile polyp measuring 5 mm in size was found in the descending  colon; polypectomy was performed with a cold snare 2.   Moderate diverticulosis was noted in the sigmoid colon 3.   There was mild diverticulosis noted in the left colon 4.   Small internal and external hemorrhoids 5.   The colon mucosa was otherwise normal  RECOMMENDATIONS: 1.  Timing of repeat colonoscopy will be determined by pathology findings. 2.   will contact re: possible hemorrhoid treatment 3.   resume all medications including clopidogrel (Plavix) eSigned:  Iva Boop, MD, [facility] 03/23/2013 11:46 AM cc: The Patient

## 2013-03-23 NOTE — Progress Notes (Signed)
Patient did not have preoperative order for IV antibiotic SSI prophylaxis. (G8918)  Patient did not experience any of the following events: a burn prior to discharge; a fall within the facility; wrong site/side/patient/procedure/implant event; or a hospital transfer or hospital admission upon discharge from the facility. (G8907)  

## 2013-03-24 ENCOUNTER — Telehealth: Payer: Self-pay | Admitting: *Deleted

## 2013-03-24 NOTE — Telephone Encounter (Signed)
  Follow up Call-  Call back number 03/23/2013  Post procedure Call Back phone  # [phone]  Permission to leave phone message Yes     Patient questions:  Do you have a fever, pain , or abdominal swelling? no Pain Score  0 *  Have you tolerated food without any problems? yes  Have you been able to return to your normal activities? yes  Do you have any questions about your discharge instructions: Diet   no Medications  no Follow up visit  no  Do you have questions or concerns about your Care? no  Actions: * If pain score is 4 or above: No action needed, pain <4.

## 2013-03-28 ENCOUNTER — Encounter: Payer: Self-pay | Admitting: Internal Medicine

## 2013-03-28 NOTE — Progress Notes (Signed)
Quick Note:  5 mm adenoma Repeat colon about 03/2018 ______

## 2013-05-11 ENCOUNTER — Ambulatory Visit: Payer: BC Managed Care – PPO | Admitting: Neurosurgery

## 2013-05-11 ENCOUNTER — Other Ambulatory Visit: Payer: BC Managed Care – PPO

## 2013-06-02 ENCOUNTER — Other Ambulatory Visit: Payer: Self-pay

## 2013-06-02 DIAGNOSIS — Z1231 Encounter for screening mammogram for malignant neoplasm of breast: Secondary | ICD-10-CM

## 2013-06-08 ENCOUNTER — Ambulatory Visit
Admission: RE | Admit: 2013-06-08 | Discharge: 2013-06-08 | Disposition: A | Discharge status: Home / Self Care | Payer: BC Managed Care – PPO | Source: Ambulatory Visit

## 2013-06-08 DIAGNOSIS — Z1231 Encounter for screening mammogram for malignant neoplasm of breast: Secondary | ICD-10-CM

## 2013-07-17 ENCOUNTER — Other Ambulatory Visit: Payer: Self-pay | Admitting: Internal Medicine

## 2013-07-18 NOTE — Telephone Encounter (Signed)
Faxed hardcopy to [facility] [address] [place] 

## 2013-07-18 NOTE — Telephone Encounter (Signed)
Done hardcopy to robin  

## 2013-07-28 ENCOUNTER — Ambulatory Visit (INDEPENDENT_AMBULATORY_CARE_PROVIDER_SITE_OTHER): Payer: BC Managed Care – PPO | Admitting: Internal Medicine

## 2013-07-28 ENCOUNTER — Encounter: Payer: Self-pay | Admitting: Internal Medicine

## 2013-07-28 ENCOUNTER — Other Ambulatory Visit (INDEPENDENT_AMBULATORY_CARE_PROVIDER_SITE_OTHER): Payer: BC Managed Care – PPO

## 2013-07-28 ENCOUNTER — Ambulatory Visit (INDEPENDENT_AMBULATORY_CARE_PROVIDER_SITE_OTHER)
Admission: RE | Admit: 2013-07-28 | Discharge: 2013-07-28 | Disposition: A | Discharge status: Home / Self Care | Payer: BC Managed Care – PPO | Source: Ambulatory Visit | Attending: Internal Medicine | Admitting: Internal Medicine

## 2013-07-28 VITALS — BP 130/82 | HR 72 | Temp 99.3°F | Ht 59.0 in | Wt 146.4 lb

## 2013-07-28 DIAGNOSIS — Z0000 Encounter for general adult medical examination without abnormal findings: Secondary | ICD-10-CM

## 2013-07-28 DIAGNOSIS — I10 Essential (primary) hypertension: Secondary | ICD-10-CM

## 2013-07-28 DIAGNOSIS — R0689 Other abnormalities of breathing: Secondary | ICD-10-CM

## 2013-07-28 DIAGNOSIS — J45909 Unspecified asthma, uncomplicated: Secondary | ICD-10-CM

## 2013-07-28 DIAGNOSIS — R0989 Other specified symptoms and signs involving the circulatory and respiratory systems: Secondary | ICD-10-CM

## 2013-07-28 LAB — LIPID PANEL
Cholesterol: 179 mg/dL (ref 0–200)
LDL Cholesterol: 94 mg/dL (ref 0–99)
Triglycerides: 60 mg/dL (ref 0.0–149.0)
VLDL: 12 mg/dL (ref 0.0–40.0)

## 2013-07-28 LAB — BASIC METABOLIC PANEL
BUN: 14 mg/dL (ref 6–23)
GFR: 99.41 mL/min (ref >60.00)
Glucose, Bld: 142 mg/dL — ABNORMAL HIGH (ref 70–99)
Potassium: 3.7 mEq/L (ref 3.5–5.1)

## 2013-07-28 LAB — CBC WITH DIFFERENTIAL/PLATELET
Basophils Absolute: 0 10*3/uL (ref 0.0–0.1)
Basophils Relative: 0.3 % (ref 0.0–3.0)
Eosinophils Absolute: 0.2 10*3/uL (ref 0.0–0.7)
HCT: 40.1 % (ref 36.0–46.0)
Monocytes Absolute: 0.6 10*3/uL (ref 0.1–1.0)
Neutro Abs: 5.2 10*3/uL (ref 1.4–7.7)
Platelets: 212 10*3/uL (ref 150.0–400.0)
WBC: 7.9 10*3/uL (ref 4.5–10.5)

## 2013-07-28 LAB — URINALYSIS, ROUTINE W REFLEX MICROSCOPIC
Bilirubin Urine: NEGATIVE
Hgb urine dipstick: NEGATIVE
Ketones, ur: NEGATIVE
Total Protein, Urine: NEGATIVE
Urine Glucose: NEGATIVE

## 2013-07-28 LAB — HEPATIC FUNCTION PANEL
AST: 16 U/L (ref 0–37)
Albumin: 4.3 g/dL (ref 3.5–5.2)
Total Protein: 7.6 g/dL (ref 6.0–8.3)

## 2013-07-28 MED ORDER — FLUTICASONE-SALMETEROL 250-50 MCG/DOSE IN AEPB: 1.0000 | INHALATION_SPRAY | Freq: Two times a day (BID) | RESPIRATORY_TRACT | Status: DC

## 2013-07-28 MED ORDER — LEVALBUTEROL TARTRATE 45 MCG/ACT IN AERO: 2.0000 | INHALATION_SPRAY | Freq: Four times a day (QID) | RESPIRATORY_TRACT | Status: DC | PRN

## 2013-07-28 MED ORDER — PREDNISONE 20 MG PO TABS: 20.0000 mg | ORAL_TABLET | Freq: Every day | ORAL | Status: DC

## 2013-07-28 NOTE — Progress Notes (Signed)
Subjective:    Patient ID: [name] [name], female    DOB: 06/15/1951, 62 y.o.   MRN: [id number]  HPI  Here for wellness and f/u;  Overall doing ok;  Pt denies CP, worsening SOB, DOE, wheezing, orthopnea, PND, worsening LE edema, palpitations, dizziness or syncope, except for mild wheezing recently with intermittent use only of her steroid inhaler that is expensive.   Pt denies neurological change such as new headache, facial or extremity weakness.  Pt denies polydipsia, polyuria, or low sugar symptoms. Pt states overall good compliance with treatment and medications, good tolerability, and has been trying to follow lower cholesterol diet.  Pt denies worsening depressive symptoms, suicidal ideation or panic. No fever, night sweats, wt loss, loss of appetite, or other constitutional symptoms.  Pt states good ability with ADL's, has low fall risk, home safety reviewed and adequate, no other significant changes in hearing or vision, and only occasionally active with exercise.   Past Medical History  Diagnosis Date  . HYPOTHYROIDISM 11/06/2007  . HYPERLIPIDEMIA 11/03/2010  . HYPERTENSION 09/13/2007  . ALLERGIC RHINITIS 11/06/2007  . ASTHMA 09/13/2007  . PERIMENOPAUSAL STATUS 09/13/2007  . OSTEOPOROSIS 11/06/2007  . ALCOHOL ABUSE, HX OF 11/06/2007  . COLONIC POLYPS, HX OF 02/09/2008     ADENOMATOUS POLYP  . INSOMNIA, HX OF 09/13/2007  . Anxiety 04/03/2011  . Stroke   . TIA (transient ischemic attack) 11/04/2011  . Impaired glucose tolerance 07/30/2013   Past Surgical History  Procedure Laterality Date  . Cesarean section      x 3  . Breast surgery  2008 and 2012    x 2 - benign, left side  . Colonoscopy  multiple    2010  . Carotid artery surgery Left     reports that she has never smoked. She has never used smokeless tobacco. She reports that she does not drink alcohol or use illicit drugs. family history includes Coronary artery disease in her other; Hyperlipidemia in her other; Hypertension  in her other. There is no history of Colon cancer. Allergies  Allergen Reactions  . Fosamax [Alendronate Sodium] Nausea And Vomiting  . Penicillins Hives   Current Outpatient Prescriptions on File Prior to Visit  Medication Sig Dispense Refill  . acetaminophen (TYLENOL) 325 MG tablet Take 650 mg by mouth every 6 (six) hours as needed.        Marland Kitchen amLODipine-benazepril (LOTREL) 5-40 MG per capsule Take 1 capsule by mouth daily.  90 capsule  3  . atorvastatin (LIPITOR) 20 MG tablet Take 1 tablet (20 mg total) by mouth daily.  90 tablet  3  . Calcium Carbonate-Vitamin D (CALCIUM-VITAMIN D) 500-200 MG-UNIT per tablet Take 1 tablet by mouth daily.        . clopidogrel (PLAVIX) 75 MG tablet Take 1 tablet (75 mg total) by mouth daily.  90 tablet  3  . fexofenadine (ALLEGRA) 180 MG tablet Take 1 tablet (180 mg total) by mouth daily.  90 tablet  3  . montelukast (SINGULAIR) 10 MG tablet Take 1 tablet (10 mg total) by mouth daily. Generic only  90 tablet  3  . zolpidem (AMBIEN) 10 MG tablet TAKE 1 TABLET BY MOUTH DAILY AS NEEDED  30 tablet  5   No current facility-administered medications on file prior to visit.    Review of Systems Constitutional: Negative for diaphoresis, activity change, appetite change or unexpected weight change.  HENT: Negative for hearing loss, ear pain, facial swelling, mouth sores and neck stiffness.  Eyes: Negative for pain, redness and visual disturbance.  Respiratory: Negative for shortness of breath and wheezing.   Cardiovascular: Negative for chest pain and palpitations.  Gastrointestinal: Negative for diarrhea, blood in stool, abdominal distention or other pain Genitourinary: Negative for hematuria, flank pain or change in urine volume.  Musculoskeletal: Negative for myalgias and joint swelling.  Skin: Negative for color change and wound.  Neurological: Negative for syncope and numbness. other than noted Hematological: Negative for adenopathy.   Psychiatric/Behavioral: Negative for hallucinations, self-injury, decreased concentration and agitation.      Objective:   Physical Exam BP 130/82  Pulse 72  Temp(Src) 99.3 F (37.4 C) (Oral)  Ht 4\' 11"  (1.499 m)  Wt 146 lb 6 oz (66.395 kg)  BMI 29.55 kg/m2  SpO2 96% VS noted,  Constitutional: Pt is oriented to person, place, and time. Appears well-developed and well-nourished.  Head: Normocephalic and atraumatic.  Right Ear: External ear normal.  Left Ear: External ear normal.  Nose: Nose normal.  Mouth/Throat: Oropharynx is clear and moist.  Eyes: Conjunctivae and EOM are normal. Pupils are equal, round, and reactive to light.  Neck: Normal range of motion. Neck supple. No JVD present. No tracheal deviation present.  Cardiovascular: Normal rate, regular rhythm, normal heart sounds and intact distal pulses.   Pulmonary/Chest: Effort normal and breath sounds some decreased bilat,. except has surprising bibas few rales, I suspect dry rales Abdominal: Soft. Bowel sounds are normal. There is no tenderness. No HSM  Musculoskeletal: Normal range of motion. Exhibits no edema.  Lymphadenopathy:  Has no cervical adenopathy.  Neurological: Pt is alert and oriented to person, place, and time. Pt has normal reflexes. No cranial nerve deficit.  Skin: Skin is warm and dry. No rash noted.  Psychiatric:  Has  normal mood and affect. Behavior is normal.      Assessment & Plan:

## 2013-07-28 NOTE — Patient Instructions (Signed)
Your EKG was OK today You are given the samples of the symbicort (to use at 2 puffs twice per day) and the Dulera (at 2 puffs twice per day) Please check with your pharmacist about the cost of each of these, and we could change the advair to one of these if less expensive You should also ask if the Xopenex is more expensive the Proair HFA or Proventil HFA, and we could consider change if one of these two is less expensive (just call or have the pharmacist call) Please take all new medication as prescribed - the low dose short course of prednisone Please continue all other medications as before Please have the pharmacy call with any other refills you may need. Please continue your efforts at being more active, low cholesterol diet, and weight control. You are otherwise up to date with prevention measures today. Please go to the LAB in the Basement (turn left off the elevator) for the tests to be done today You will be contacted by phone if any changes need to be made immediately.  Otherwise, you will receive a letter about your results with an explanation, but please check with MyChart first.  Please remember to sign up for My Chart if you have not done so, as this will be important to you in the future with finding out test results, communicating by private email, and scheduling acute appointments online when needed.  Please return in 6 months, or sooner if needed

## 2013-07-30 ENCOUNTER — Encounter: Payer: Self-pay | Admitting: Internal Medicine

## 2013-07-30 ENCOUNTER — Other Ambulatory Visit: Payer: Self-pay | Admitting: Internal Medicine

## 2013-07-30 DIAGNOSIS — R7302 Impaired glucose tolerance (oral): Secondary | ICD-10-CM

## 2013-07-30 DIAGNOSIS — R0689 Other abnormalities of breathing: Secondary | ICD-10-CM | POA: Insufficient documentation

## 2013-07-30 HISTORY — DX: Impaired glucose tolerance (oral): R73.02

## 2013-07-30 NOTE — Assessment & Plan Note (Signed)
stable overall by history and exam, recent data reviewed with pt, and pt to continue medical treatment as before,  to f/u any worsening symptoms or concerns BP Readings from Last 3 Encounters:  07/28/13 130/82  03/23/13 118/64  03/13/13 116/74

## 2013-07-30 NOTE — Assessment & Plan Note (Addendum)
Mild uncontrolled due to not being to afford her med and use consistently;  Gave sample to day of symbicort 160 and dulera 200 for trial use as well to help financially, but also instruction to let us know which may be more affordable with her drug plan but inquiring of her pharmacist, also for short course low dose predpack x 1

## 2013-07-30 NOTE — Assessment & Plan Note (Signed)
?   Clinical significance, suspect dry rales, but for cxr - r/o edema

## 2013-07-30 NOTE — Assessment & Plan Note (Signed)

## 2013-08-04 ENCOUNTER — Telehealth: Payer: Self-pay | Admitting: Internal Medicine

## 2013-08-04 NOTE — Telephone Encounter (Signed)
Patient calling to request results from xray that was done last Friday.  Please call.

## 2013-08-04 NOTE — Telephone Encounter (Signed)
Called left msg. To call back 

## 2013-08-04 NOTE — Telephone Encounter (Signed)
Called the patient left msg to call back 

## 2013-08-05 ENCOUNTER — Other Ambulatory Visit: Payer: Self-pay | Admitting: Internal Medicine

## 2013-08-07 NOTE — Telephone Encounter (Signed)
Faxed hardcopy to [facility] [address] [place] 

## 2013-08-07 NOTE — Telephone Encounter (Signed)
Patient called back and did inform of xray results

## 2013-08-07 NOTE — Telephone Encounter (Signed)
Called the patient again.  Did leave detailed msg. MD put on mychart xray results.

## 2013-08-07 NOTE — Telephone Encounter (Signed)
Done hardcopy to robin  

## 2013-08-12 ENCOUNTER — Other Ambulatory Visit: Payer: Self-pay | Admitting: Internal Medicine

## 2013-10-19 ENCOUNTER — Other Ambulatory Visit: Payer: Self-pay

## 2013-10-31 ENCOUNTER — Telehealth: Payer: Self-pay | Admitting: *Deleted

## 2013-10-31 NOTE — Telephone Encounter (Signed)
Received fax pt needing PA on her ambien. Completed PA on cover-my-meds waiting on approval status...Raechel Chute

## 2013-11-01 NOTE — Telephone Encounter (Signed)
Received PA back med has been approved. Notified pharmacy spoke with stephanie gave approval status...Raechel Chute

## 2014-02-02 ENCOUNTER — Encounter: Payer: Self-pay | Admitting: Internal Medicine

## 2014-02-02 ENCOUNTER — Ambulatory Visit (INDEPENDENT_AMBULATORY_CARE_PROVIDER_SITE_OTHER): Payer: BC Managed Care – PPO | Admitting: Internal Medicine

## 2014-02-02 VITALS — BP 144/90 | HR 88 | Temp 98.5°F | Wt 148.0 lb

## 2014-02-02 DIAGNOSIS — R7309 Other abnormal glucose: Secondary | ICD-10-CM

## 2014-02-02 DIAGNOSIS — J45909 Unspecified asthma, uncomplicated: Secondary | ICD-10-CM

## 2014-02-02 DIAGNOSIS — I10 Essential (primary) hypertension: Secondary | ICD-10-CM

## 2014-02-02 DIAGNOSIS — R7302 Impaired glucose tolerance (oral): Secondary | ICD-10-CM

## 2014-02-02 DIAGNOSIS — Z0000 Encounter for general adult medical examination without abnormal findings: Secondary | ICD-10-CM

## 2014-02-02 MED ORDER — CLOPIDOGREL BISULFATE 75 MG PO TABS: 75.0000 mg | ORAL_TABLET | Freq: Every day | ORAL | Status: DC

## 2014-02-02 MED ORDER — AMLODIPINE BESY-BENAZEPRIL HCL 5-40 MG PO CAPS: 1.0000 | ORAL_CAPSULE | Freq: Every day | ORAL | Status: DC

## 2014-02-02 MED ORDER — ATORVASTATIN CALCIUM 20 MG PO TABS: 20.0000 mg | ORAL_TABLET | Freq: Every day | ORAL | Status: DC

## 2014-02-02 NOTE — Assessment & Plan Note (Signed)
stable overall by history and exam, recent data reviewed with pt, and pt to continue medical treatment as before,  to f/u any worsening symptoms or concerns BP Readings from Last 3 Encounters:  02/02/14 144/90  07/28/13 130/82  03/23/13 118/64

## 2014-02-02 NOTE — Assessment & Plan Note (Signed)
stable overall by history and exam, recent data reviewed with pt, and pt to continue medical treatment as before,  to f/u any worsening symptoms or concerns SpO2 Readings from Last 3 Encounters:  02/02/14 96%  07/28/13 96%  03/23/13 99%

## 2014-02-02 NOTE — Progress Notes (Signed)
Subjective:    Patient ID: [name] [name], female    DOB: 1951-02-25, 63 y.o.   MRN: [id number]  HPI Here to f/u; overall doing ok,  Pt denies chest pain, increased sob or doe, wheezing, orthopnea, PND, increased LE swelling, palpitations, dizziness or syncope.  Pt denies polydipsia, polyuria, or low sugar symptoms such as weakness or confusion improved with po intake.  Pt denies new neurological symptoms such as new headache, or facial or extremity weakness or numbness.   Pt states overall good compliance with meds, has been trying to follow lower cholesterol, diabetic diet, with wt overall stable Past Medical History  Diagnosis Date  . HYPOTHYROIDISM 11/06/2007  . HYPERLIPIDEMIA 11/03/2010  . HYPERTENSION 09/13/2007  . ALLERGIC RHINITIS 11/06/2007  . ASTHMA 09/13/2007  . PERIMENOPAUSAL STATUS 09/13/2007  . OSTEOPOROSIS 11/06/2007  . ALCOHOL ABUSE, HX OF 11/06/2007  . COLONIC POLYPS, HX OF 02/09/2008     ADENOMATOUS POLYP  . INSOMNIA, HX OF 09/13/2007  . Anxiety 04/03/2011  . Stroke   . TIA (transient ischemic attack) 11/04/2011  . Impaired glucose tolerance 07/30/2013   Past Surgical History  Procedure Laterality Date  . Cesarean section      x 3  . Breast surgery  2008 and 2012    x 2 - benign, left side  . Colonoscopy  multiple    2010  . Carotid artery surgery Left     reports that she has never smoked. She has never used smokeless tobacco. She reports that she does not drink alcohol or use illicit drugs. family history includes Coronary artery disease in her other; Hyperlipidemia in her other; Hypertension in her other. There is no history of Colon cancer. Allergies  Allergen Reactions  . Fosamax [Alendronate Sodium] Nausea And Vomiting  . Penicillins Hives   Current Outpatient Prescriptions on File Prior to Visit  Medication Sig Dispense Refill  . acetaminophen (TYLENOL) 325 MG tablet Take 650 mg by mouth every 6 (six) hours as needed.        . Fluticasone-Salmeterol  (ADVAIR DISKUS) 250-50 MCG/DOSE AEPB Inhale 1 puff into the lungs 2 (two) times daily.  60 each  11  . levalbuterol (XOPENEX HFA) 45 MCG/ACT inhaler Inhale 2 puffs into the lungs every 6 (six) hours as needed for wheezing.  1 Inhaler  11  . montelukast (SINGULAIR) 10 MG tablet TAKE ONE TABLET BY MOUTH ONE TIME DAILY  90 tablet  3  . predniSONE (DELTASONE) 20 MG tablet Take 1 tablet (20 mg total) by mouth daily.  5 tablet  0  . zolpidem (AMBIEN) 10 MG tablet TAKE 1 TABLET BY MOUTH DAILY AS NEEDED  30 tablet  5  . fexofenadine (ALLEGRA) 180 MG tablet Take 1 tablet (180 mg total) by mouth daily.  90 tablet  3   No current facility-administered medications on file prior to visit.   Review of Systems  Constitutional: Negative for unexpected weight change, or unusual diaphoresis  HENT: Negative for tinnitus.   Eyes: Negative for photophobia and visual disturbance.  Respiratory: Negative for choking and stridor.   Gastrointestinal: Negative for vomiting and blood in stool.  Genitourinary: Negative for hematuria and decreased urine volume.  Musculoskeletal: Negative for acute joint swelling Skin: Negative for color change and wound.  Neurological: Negative for tremors and numbness other than noted  Psychiatric/Behavioral: Negative for decreased concentration or  hyperactivity.       Objective:   Physical Exam BP 144/90  Pulse 88  Temp(Src) 98.5 F (  36.9 C) (Oral)  Wt 148 lb (67.132 kg)  SpO2 96% VS noted, not ill appearing Constitutional: Pt appears well-developed and well-nourished.  HENT: Head: NCAT.  Right Ear: External ear normal.  Left Ear: External ear normal.  Eyes: Conjunctivae and EOM are normal. Pupils are equal, round, and reactive to light.  Neck: Normal range of motion. Neck supple.  Cardiovascular: Normal rate and regular rhythm.   Pulmonary/Chest: Effort normal and breath sounds normal.  Neurological: Pt is alert. Not confused  Skin: Skin is warm. No erythema.    Psychiatric: Pt behavior is normal. Thought content normal.     Assessment & Plan:

## 2014-02-02 NOTE — Assessment & Plan Note (Signed)
stable overall by history and exam, recent data reviewed with pt, and pt to continue medical treatment as before,  to f/u any worsening symptoms or concerns Lab Results  Component Value Date   HGBA1C  Value: 6.1 (NOTE)                                                                       According to the ADA Clinical Practice Recommendations for 2011, when HbA1c is used as a screening test:   >=6.5%   Diagnostic of Diabetes Mellitus           (if abnormal result  is confirmed)  5.7-6.4%   Increased risk of developing Diabetes Mellitus  References:Diagnosis and Classification of Diabetes Mellitus,Diabetes SPQZ,3007,62(UQJFH 1):S62-S69 and Standards of Medical Care in         Diabetes - 2011,Diabetes [id number],2563,89  (Suppl 1):S11-S61.* 03/04/2011

## 2014-02-02 NOTE — Patient Instructions (Addendum)
Please continue all other medications as before, and refills have been done if requested. Please have the pharmacy call with any other refills you may need.  Please return in 6 months, or sooner if needed, with Lab testing done 3-5 days before  

## 2014-02-21 ENCOUNTER — Telehealth: Payer: Self-pay | Admitting: Internal Medicine

## 2014-02-21 MED ORDER — ZOLPIDEM TARTRATE 10 MG PO TABS: ORAL_TABLET | ORAL | Status: DC

## 2014-02-21 NOTE — Telephone Encounter (Signed)
Patient is requesting script for Ambien to be called in to her pharmacy with one to be placed on hold.  Pharmacy is [facility]

## 2014-02-21 NOTE — Telephone Encounter (Signed)
Done hardcopy to robin  

## 2014-02-22 NOTE — Telephone Encounter (Signed)
Faxed hardcopy to [facility] and informed the patient.

## 2014-03-20 ENCOUNTER — Ambulatory Visit: Payer: BC Managed Care – PPO | Admitting: Internal Medicine

## 2014-04-30 ENCOUNTER — Other Ambulatory Visit: Payer: Self-pay

## 2014-04-30 DIAGNOSIS — Z1231 Encounter for screening mammogram for malignant neoplasm of breast: Secondary | ICD-10-CM

## 2014-05-29 ENCOUNTER — Other Ambulatory Visit: Payer: Self-pay | Admitting: Internal Medicine

## 2014-06-11 ENCOUNTER — Ambulatory Visit
Admission: RE | Admit: 2014-06-11 | Discharge: 2014-06-11 | Disposition: A | Discharge status: Home / Self Care | Payer: BC Managed Care – PPO | Source: Ambulatory Visit

## 2014-06-11 DIAGNOSIS — Z1231 Encounter for screening mammogram for malignant neoplasm of breast: Secondary | ICD-10-CM

## 2014-06-11 LAB — HM MAMMOGRAPHY

## 2014-07-24 ENCOUNTER — Other Ambulatory Visit: Payer: Self-pay | Admitting: Internal Medicine

## 2014-07-24 NOTE — Telephone Encounter (Signed)
Done hardcopy to robin  

## 2014-07-25 NOTE — Telephone Encounter (Signed)
Faxed hardcopy to [facility]

## 2014-08-24 ENCOUNTER — Other Ambulatory Visit: Payer: Self-pay | Admitting: Internal Medicine

## 2014-09-28 ENCOUNTER — Other Ambulatory Visit: Payer: Self-pay

## 2014-10-04 ENCOUNTER — Other Ambulatory Visit: Payer: BC Managed Care – PPO

## 2014-10-10 ENCOUNTER — Other Ambulatory Visit (INDEPENDENT_AMBULATORY_CARE_PROVIDER_SITE_OTHER): Payer: BC Managed Care – PPO

## 2014-10-10 ENCOUNTER — Encounter: Payer: Self-pay | Admitting: Internal Medicine

## 2014-10-10 ENCOUNTER — Ambulatory Visit (INDEPENDENT_AMBULATORY_CARE_PROVIDER_SITE_OTHER): Payer: BC Managed Care – PPO | Admitting: Internal Medicine

## 2014-10-10 VITALS — BP 162/100 | HR 85 | Temp 98.2°F | Ht 59.0 in | Wt 146.8 lb

## 2014-10-10 DIAGNOSIS — I6522 Occlusion and stenosis of left carotid artery: Secondary | ICD-10-CM

## 2014-10-10 DIAGNOSIS — R7302 Impaired glucose tolerance (oral): Secondary | ICD-10-CM

## 2014-10-10 DIAGNOSIS — Z0000 Encounter for general adult medical examination without abnormal findings: Secondary | ICD-10-CM

## 2014-10-10 DIAGNOSIS — I10 Essential (primary) hypertension: Secondary | ICD-10-CM

## 2014-10-10 DIAGNOSIS — Z23 Encounter for immunization: Secondary | ICD-10-CM

## 2014-10-10 DIAGNOSIS — J4532 Mild persistent asthma with status asthmaticus: Secondary | ICD-10-CM

## 2014-10-10 LAB — CBC WITH DIFFERENTIAL/PLATELET
BASOS ABS: 0 10*3/uL (ref 0.0–0.1)
Basophils Relative: 0.2 % (ref 0.0–3.0)
Eosinophils Absolute: 0.1 10*3/uL (ref 0.0–0.7)
Eosinophils Relative: 2 % (ref 0.0–5.0)
HEMATOCRIT: 41.4 % (ref 36.0–46.0)
Hemoglobin: 13.8 g/dL (ref 12.0–15.0)
LYMPHS ABS: 2.4 10*3/uL (ref 0.7–4.0)
Lymphocytes Relative: 32.3 % (ref 12.0–46.0)
MCHC: 33.2 g/dL (ref 30.0–36.0)
MCV: 92.4 fl (ref 78.0–100.0)
MONO ABS: 0.7 10*3/uL (ref 0.1–1.0)
Monocytes Relative: 8.8 % (ref 3.0–12.0)
NEUTROS ABS: 4.3 10*3/uL (ref 1.4–7.7)
Neutrophils Relative %: 56.7 % (ref 43.0–77.0)
PLATELETS: 243 10*3/uL (ref 150.0–400.0)
RBC: 4.48 Mil/uL (ref 3.87–5.11)
RDW: 14.6 % (ref 11.5–15.5)
WBC: 7.6 10*3/uL (ref 4.0–10.5)

## 2014-10-10 LAB — URINALYSIS, ROUTINE W REFLEX MICROSCOPIC
BILIRUBIN URINE: NEGATIVE
Hgb urine dipstick: NEGATIVE
KETONES UR: NEGATIVE
LEUKOCYTES UA: NEGATIVE
Nitrite: NEGATIVE
RBC / HPF: NONE SEEN (ref 0–?)
Specific Gravity, Urine: 1.01 (ref 1.000–1.030)
Total Protein, Urine: NEGATIVE
URINE GLUCOSE: NEGATIVE
Urobilinogen, UA: 0.2 (ref 0.0–1.0)
WBC, UA: NONE SEEN (ref 0–?)
pH: 7 (ref 5.0–8.0)

## 2014-10-10 LAB — BASIC METABOLIC PANEL
BUN: 7 mg/dL (ref 6–23)
CO2: 26 mEq/L (ref 19–32)
Calcium: 9.2 mg/dL (ref 8.4–10.5)
Chloride: 103 mEq/L (ref 96–112)
Creatinine, Ser: 0.7 mg/dL (ref 0.4–1.2)
GFR: 112.58 mL/min (ref >60.00)
GLUCOSE: 94 mg/dL (ref 70–99)
POTASSIUM: 3.2 meq/L — AB (ref 3.5–5.1)
Sodium: 139 mEq/L (ref 135–145)

## 2014-10-10 LAB — HEPATIC FUNCTION PANEL
ALT: 13 U/L (ref 0–35)
AST: 18 U/L (ref 0–37)
Albumin: 3.7 g/dL (ref 3.5–5.2)
Alkaline Phosphatase: 117 U/L (ref 39–117)
BILIRUBIN DIRECT: 0.1 mg/dL (ref 0.0–0.3)
BILIRUBIN TOTAL: 0.6 mg/dL (ref 0.2–1.2)
Total Protein: 7.6 g/dL (ref 6.0–8.3)

## 2014-10-10 LAB — LIPID PANEL
CHOLESTEROL: 171 mg/dL (ref 0–200)
HDL: 64.9 mg/dL (ref >39.00)
LDL Cholesterol: 95 mg/dL (ref 0–99)
NonHDL: 106.1
Total CHOL/HDL Ratio: 3
Triglycerides: 56 mg/dL (ref 0.0–149.0)
VLDL: 11.2 mg/dL (ref 0.0–40.0)

## 2014-10-10 LAB — TSH: TSH: 3.34 u[IU]/mL (ref 0.35–4.50)

## 2014-10-10 LAB — HEMOGLOBIN A1C: Hgb A1c MFr Bld: 6.3 % (ref 4.6–6.5)

## 2014-10-10 MED ORDER — HYDROCHLOROTHIAZIDE 12.5 MG PO CAPS: 12.5000 mg | ORAL_CAPSULE | Freq: Every day | ORAL | Status: DC

## 2014-10-10 MED ORDER — AMLODIPINE BESY-BENAZEPRIL HCL 5-40 MG PO CAPS: 1.0000 | ORAL_CAPSULE | Freq: Every day | ORAL | Status: DC

## 2014-10-10 MED ORDER — ATORVASTATIN CALCIUM 20 MG PO TABS: 20.0000 mg | ORAL_TABLET | Freq: Every day | ORAL | Status: DC

## 2014-10-10 MED ORDER — CLOPIDOGREL BISULFATE 75 MG PO TABS: 75.0000 mg | ORAL_TABLET | Freq: Every day | ORAL | Status: DC

## 2014-10-10 NOTE — Assessment & Plan Note (Signed)
Mild to mod uncontrolled, for add hct 12.5 qd, cont all other meds, wt control, low salt, regular exercise BP Readings from Last 3 Encounters:  10/10/14 162/100  02/02/14 144/90  07/28/13 130/82

## 2014-10-10 NOTE — Assessment & Plan Note (Addendum)
Mild uncontrolled intermittent, Gave sample proair respimat for prn use, pt cannot o/w afford inhalers

## 2014-10-10 NOTE — Patient Instructions (Addendum)
You had the flu shot today  Please take all new medication as prescribed - the mild fluid pill HCT at 12.5 mg per day  Please check your Blood Pressure on a regular basis at home, and return for BP persistently more than 140/90  You will be contacted regarding the referral for: carotid ultrasound  Please continue all other medications as before, and refills have been done if requested.  Please have the pharmacy call with any other refills you may need.  Please continue your efforts at being more active, low cholesterol diet, and weight control.  You are otherwise up to date with prevention measures today.  Please keep your appointments with your specialists as you may have planned  Please go to the LAB in the Basement (turn left off the elevator) for the tests to be done today  You will be contacted by phone if any changes need to be made immediately.  Otherwise, you will receive a letter about your results with an explanation, but please check with MyChart first.  Please remember to sign up for MyChart if you have not done so, as this will be important to you in the future with finding out test results, communicating by private email, and scheduling acute appointments online when needed.  Please return in 1 year for your yearly visit, or sooner if needed, with Lab testing done 3-5 days before

## 2014-10-10 NOTE — Assessment & Plan Note (Signed)

## 2014-10-10 NOTE — Progress Notes (Signed)
Pre visit review using our clinic review tool, if applicable. No additional management support is needed unless otherwise documented below in the visit note. 

## 2014-10-10 NOTE — Progress Notes (Signed)
Subjective:    Patient ID: [name] [name], female    DOB: September 15, 1951, 63 y.o.   MRN: [id number]  HPI  Here for wellness and f/u;  Overall doing ok;  Pt denies CP, worsening SOB, DOE, wheezing, orthopnea, PND, worsening LE edema, palpitations, dizziness or syncope.  Pt denies neurological change such as new headache, facial or extremity weakness.  Pt denies polydipsia, polyuria, or low sugar symptoms. Pt states overall good compliance with treatment and medications, good tolerability, and has been trying to follow lower cholesterol diet.  Pt denies worsening depressive symptoms, suicidal ideation or panic. No fever, night sweats, wt loss, loss of appetite, or other constitutional symptoms.  Pt states good ability with ADL's, has low fall risk, home safety reviewed and adequate, no other significant changes in hearing or vision, and only occasionally active with exercise.  Has to take 2 tylenol PM and ambien qhs  As needed for sleep persistent chronic.tamazepam did not work well for her previously - "getting high" like in college. Has not f/u recently for left carotid stenosis, at one point was getting every 3 mo per Dr Su Monks. BP at home has not been as high as today, but still < 140/90, despite good med compliacne Past Medical History  Diagnosis Date  . HYPOTHYROIDISM 11/06/2007  . HYPERLIPIDEMIA 11/03/2010  . HYPERTENSION 09/13/2007  . ALLERGIC RHINITIS 11/06/2007  . ASTHMA 09/13/2007  . PERIMENOPAUSAL STATUS 09/13/2007  . OSTEOPOROSIS 11/06/2007  . ALCOHOL ABUSE, HX OF 11/06/2007  . COLONIC POLYPS, HX OF 02/09/2008     ADENOMATOUS POLYP  . INSOMNIA, HX OF 09/13/2007  . Anxiety 04/03/2011  . Stroke   . TIA (transient ischemic attack) 11/04/2011  . Impaired glucose tolerance 07/30/2013   Past Surgical History  Procedure Laterality Date  . Cesarean section      x 3  . Breast surgery  2008 and 2012    x 2 - benign, left side  . Colonoscopy  multiple    2010  . Carotid artery surgery  Left     reports that she has never smoked. She has never used smokeless tobacco. She reports that she does not drink alcohol or use illicit drugs. family history includes Coronary artery disease in her other; Hyperlipidemia in her other; Hypertension in her other. There is no history of Colon cancer. Allergies  Allergen Reactions  . Fosamax [Alendronate Sodium] Nausea And Vomiting  . Penicillins Hives   Current Outpatient Prescriptions on File Prior to Visit  Medication Sig Dispense Refill  . acetaminophen (TYLENOL) 325 MG tablet Take 650 mg by mouth every 6 (six) hours as needed.        . Fluticasone-Salmeterol (ADVAIR DISKUS) 250-50 MCG/DOSE AEPB Inhale 1 puff into the lungs 2 (two) times daily.  60 each  11  . montelukast (SINGULAIR) 10 MG tablet TAKE 1 TABLET BY MOUTH ONE TIME DAILY  90 tablet  2  . zolpidem (AMBIEN) 10 MG tablet TAKE 1 TABLET BY MOUTH DAILY AS NEEDED  30 tablet  5  . fexofenadine (ALLEGRA) 180 MG tablet Take 1 tablet (180 mg total) by mouth daily.  90 tablet  3  . levalbuterol (XOPENEX HFA) 45 MCG/ACT inhaler Inhale 2 puffs into the lungs every 6 (six) hours as needed for wheezing.  1 Inhaler  11   No current facility-administered medications on file prior to visit.   Review of Systems Constitutional: Negative for increased diaphoresis, other activity, appetite or other siginficant weight change  HENT: Negative for  worsening hearing loss, ear pain, facial swelling, mouth sores and neck stiffness.   Eyes: Negative for other worsening pain, redness or visual disturbance.  Respiratory: Negative for shortness of breath and wheezing.   Cardiovascular: Negative for chest pain and palpitations.  Gastrointestinal: Negative for diarrhea, blood in stool, abdominal distention or other pain Genitourinary: Negative for hematuria, flank pain or change in urine volume.  Musculoskeletal: Negative for myalgias or other joint complaints.  Skin: Negative for color change and wound.    Neurological: Negative for syncope and numbness. other than noted Hematological: Negative for adenopathy. or other swelling Psychiatric/Behavioral: Negative for hallucinations, self-injury, decreased concentration or other worsening agitation.      Objective:   Physical Exam BP 162/100  Pulse 85  Temp(Src) 98.2 F (36.8 C) (Oral)  Ht 4\' 11"  (1.499 m)  Wt 146 lb 12 oz (66.565 kg)  BMI 29.62 kg/m2  SpO2 95% VS noted,  Constitutional: Pt is oriented to person, place, and time. Appears well-developed and well-nourished.  Head: Normocephalic and atraumatic.  Right Ear: External ear normal.  Left Ear: External ear normal.  Nose: Nose normal.  Mouth/Throat: Oropharynx is clear and moist.  Eyes: Conjunctivae and EOM are normal. Pupils are equal, round, and reactive to light.  Neck: Normal range of motion. Neck supple. No JVD present. No tracheal deviation present.  Cardiovascular: Normal rate, regular rhythm, normal heart sounds and intact distal pulses.   Pulmonary/Chest: Effort normal and breath sounds without rales or wheezing  Abdominal: Soft. Bowel sounds are normal. NT. No HSM  Musculoskeletal: Normal range of motion. Exhibits no edema.  Lymphadenopathy:  Has no cervical adenopathy.  Neurological: Pt is alert and oriented to person, place, and time. Pt has normal reflexes. No cranial nerve deficit. Motor grossly intact Skin: Skin is warm and dry. No rash noted.  Psychiatric:  Has normal mood and affect. Behavior is normal.     Assessment & Plan:

## 2014-10-10 NOTE — Assessment & Plan Note (Signed)
stable overall by history and exam, recent data reviewed with pt, and pt to continue medical treatment as before,  to f/u any worsening symptoms or concerns Lab Results  Component Value Date   HGBA1C  Value: 6.1 (NOTE)                                                                       According to the ADA Clinical Practice Recommendations for 2011, when HbA1c is used as a screening test:   >=6.5%   Diagnostic of Diabetes Mellitus           (if abnormal result  is confirmed)  5.7-6.4%   Increased risk of developing Diabetes Mellitus  References:Diagnosis and Classification of Diabetes Mellitus,Diabetes UQJF,3545,62(BWLSL 1):S62-S69 and Standards of Medical Care in         Diabetes - 2011,Diabetes HTDS,2876,81  (Suppl 1):S11-S61.* 03/04/2011   For f/u a1c

## 2014-10-10 NOTE — Assessment & Plan Note (Addendum)
Stable, for cont'd plavix use, For f/u duplex

## 2014-10-24 ENCOUNTER — Other Ambulatory Visit: Payer: Self-pay | Admitting: Internal Medicine

## 2014-10-24 DIAGNOSIS — R42 Dizziness and giddiness: Secondary | ICD-10-CM

## 2014-11-02 ENCOUNTER — Encounter (HOSPITAL_COMMUNITY): Payer: BC Managed Care – PPO

## 2014-11-06 ENCOUNTER — Encounter (HOSPITAL_COMMUNITY): Payer: BC Managed Care – PPO

## 2014-11-07 ENCOUNTER — Ambulatory Visit (HOSPITAL_COMMUNITY): Payer: BC Managed Care – PPO | Attending: Internal Medicine | Admitting: *Deleted

## 2014-11-07 DIAGNOSIS — R42 Dizziness and giddiness: Secondary | ICD-10-CM

## 2014-11-07 DIAGNOSIS — I6523 Occlusion and stenosis of bilateral carotid arteries: Secondary | ICD-10-CM

## 2014-11-07 DIAGNOSIS — E785 Hyperlipidemia, unspecified: Secondary | ICD-10-CM | POA: Diagnosis not present

## 2014-11-07 DIAGNOSIS — I10 Essential (primary) hypertension: Secondary | ICD-10-CM | POA: Insufficient documentation

## 2014-11-07 DIAGNOSIS — Z8673 Personal history of transient ischemic attack (TIA), and cerebral infarction without residual deficits: Secondary | ICD-10-CM | POA: Insufficient documentation

## 2014-11-07 NOTE — Progress Notes (Signed)
Carotid duplex completed 

## 2014-11-23 ENCOUNTER — Telehealth: Payer: Self-pay | Admitting: Internal Medicine

## 2014-11-23 MED ORDER — ZOLPIDEM TARTRATE 10 MG PO TABS: 10.0000 mg | ORAL_TABLET | Freq: Every day | ORAL | Status: DC | PRN

## 2014-11-23 MED ORDER — TRAZODONE HCL 50 MG PO TABS: 25.0000 mg | ORAL_TABLET | Freq: Every evening | ORAL | Status: DC | PRN

## 2014-11-23 NOTE — Telephone Encounter (Signed)
Received PA for Zolpidem.  Patient has currently been out for 3 days.  Is there an alternative to offer until able to initiate PA.

## 2014-11-23 NOTE — Telephone Encounter (Signed)
Faxed hardcopy for Ambien to [facility] [address]

## 2014-11-23 NOTE — Telephone Encounter (Signed)
Ok to try trazodone  - done erx 

## 2014-11-23 NOTE — Addendum Note (Signed)
Addended by: Biagio Borg on: 11/23/2014 05:59 PM   Modules accepted: Orders

## 2014-11-23 NOTE — Telephone Encounter (Signed)
Done hardcopy to robin  

## 2014-11-23 NOTE — Telephone Encounter (Signed)
Pt called in and is requesting refill on her Ambien .[name] [name]  She said that the pharmacy has faxed a request 3 times.  I didn't see it.

## 2014-11-26 ENCOUNTER — Telehealth: Payer: Self-pay | Admitting: Internal Medicine

## 2014-11-26 NOTE — Telephone Encounter (Signed)
Her insurance does not cover ambien  Other medications brand name likely will not be covered as well, such as lunesta  Ok for melatonin otc qhs prn, o/w I have nothing else to offer at this time

## 2014-11-26 NOTE — Telephone Encounter (Signed)
Pt called in and said that the meds that was sent in is not helping at all.  She said that she has not slept in 5 days and is about to go crazy.  She said that she needs something.  Explain to her that [name] was not in today and would be tommorrow   Best number is [phone]

## 2014-11-26 NOTE — Telephone Encounter (Signed)
Pt called back to check on status, again explained that [name] was not in office to address this today.

## 2014-11-27 NOTE — Telephone Encounter (Signed)
Called left a detailed message on cell number of change

## 2014-11-27 NOTE — Telephone Encounter (Signed)
Called the patient informed Trazodone was sent in to her pharmacy.  She states she did pickup the trazodone on Friday and it does not help. Also melatonin does not help either.  She has not had much sleep and does not know what to do.  Please advise

## 2014-11-28 MED ORDER — TEMAZEPAM 15 MG PO CAPS: ORAL_CAPSULE | ORAL | Status: DC

## 2014-11-28 NOTE — Telephone Encounter (Signed)
Faxed hardcopy for Temazepam to [facility].  Called the patient at  Work [phone]) to inform script sent in.

## 2014-11-28 NOTE — Telephone Encounter (Signed)
Can try a temporary dose of temazepam, but I cannot do this long term due to risk of dependence

## 2014-12-20 ENCOUNTER — Other Ambulatory Visit: Payer: Self-pay | Admitting: Internal Medicine

## 2014-12-20 NOTE — Telephone Encounter (Signed)
Done erx - trazodone 

## 2015-01-28 ENCOUNTER — Telehealth: Payer: Self-pay | Admitting: Internal Medicine

## 2015-01-28 NOTE — Telephone Encounter (Signed)
States temazepam will not work for her.  She is requesting script for [facility].  States she will pay out of pocket for this script if insurance does not cover.  Also would like to know if she could take both.  Please advise.

## 2015-01-29 MED ORDER — ZOLPIDEM TARTRATE 10 MG PO TABS: 10.0000 mg | ORAL_TABLET | Freq: Every day | ORAL | Status: DC | PRN

## 2015-01-29 NOTE — Telephone Encounter (Signed)
Notified pt fax rx to [facility]...[name] [name]

## 2015-01-29 NOTE — Telephone Encounter (Signed)
Done hardcopy to rachel  

## 2015-02-28 ENCOUNTER — Other Ambulatory Visit: Payer: Self-pay | Admitting: Internal Medicine

## 2015-04-09 ENCOUNTER — Encounter: Payer: Self-pay | Admitting: Internal Medicine

## 2015-04-09 ENCOUNTER — Ambulatory Visit (INDEPENDENT_AMBULATORY_CARE_PROVIDER_SITE_OTHER): Payer: BC Managed Care – PPO | Admitting: Internal Medicine

## 2015-04-09 VITALS — BP 142/88 | HR 78 | Temp 98.5°F | Resp 18 | Ht 59.0 in | Wt 145.1 lb

## 2015-04-09 DIAGNOSIS — R7302 Impaired glucose tolerance (oral): Secondary | ICD-10-CM

## 2015-04-09 DIAGNOSIS — I10 Essential (primary) hypertension: Secondary | ICD-10-CM

## 2015-04-09 DIAGNOSIS — M25512 Pain in left shoulder: Secondary | ICD-10-CM | POA: Diagnosis not present

## 2015-04-09 MED ORDER — TIZANIDINE HCL 4 MG PO TABS: 4.0000 mg | ORAL_TABLET | Freq: Four times a day (QID) | ORAL | Status: DC | PRN

## 2015-04-09 MED ORDER — TRAMADOL HCL 50 MG PO TABS: 50.0000 mg | ORAL_TABLET | Freq: Three times a day (TID) | ORAL | Status: DC | PRN

## 2015-04-09 MED ORDER — PREDNISONE 10 MG PO TABS: ORAL_TABLET | ORAL | Status: DC

## 2015-04-09 NOTE — Patient Instructions (Signed)
Please take all new medication as prescribed  Please continue all other medications as before, and refills have been done if requested.  Please have the pharmacy call with any other refills you may need.  Please keep your appointments with your specialists as you may have planned     

## 2015-04-09 NOTE — Assessment & Plan Note (Signed)
stable overall by history and exam, recent data reviewed with pt, and pt to continue medical treatment as before,  to f/u any worsening symptoms or concerns BP Readings from Last 3 Encounters:  04/09/15 142/88  10/10/14 162/100  02/02/14 144/90

## 2015-04-09 NOTE — Assessment & Plan Note (Signed)
stable overall by history and exam, recent data reviewed with pt, and pt to continue medical treatment as before,  to f/u any worsening symptoms or concerns Lab Results  Component Value Date   HGBA1C 6.3 10/10/2014   Pt to call for onset polys or cbg > 200 on steroid tx

## 2015-04-09 NOTE — Progress Notes (Signed)
Subjective:    Patient ID: [name] [name], female    DOB: December 08, 1951, 64 y.o.   MRN: [id number]  HPI  Here with 2 wks onset left shoulder pain, mild to start, now mod to severe, no trauma, fever, or swelling; No neck pain or radicular pain, constant, worse to raise the arm at all nonspecific to direction, and no bowel or bladder change, fever, wt loss,  worsening LE pain/numbness/weakness, gait change or falls.  Pt denies chest pain, increased sob or doe, wheezing, orthopnea, PND, increased LE swelling, palpitations, dizziness or syncope.  Pt denies new neurological symptoms such as new headache, or facial or extremity weakness or numbness  Has ongoing insomnia, trazodone and temazepam has not helped. Past Medical History  Diagnosis Date  . HYPOTHYROIDISM 11/06/2007  . HYPERLIPIDEMIA 11/03/2010  . HYPERTENSION 09/13/2007  . ALLERGIC RHINITIS 11/06/2007  . ASTHMA 09/13/2007  . PERIMENOPAUSAL STATUS 09/13/2007  . OSTEOPOROSIS 11/06/2007  . ALCOHOL ABUSE, HX OF 11/06/2007  . COLONIC POLYPS, HX OF 02/09/2008     ADENOMATOUS POLYP  . INSOMNIA, HX OF 09/13/2007  . Anxiety 04/03/2011  . Stroke   . TIA (transient ischemic attack) 11/04/2011  . Impaired glucose tolerance 07/30/2013   Past Surgical History  Procedure Laterality Date  . Cesarean section      x 3  . Breast surgery  2008 and 2012    x 2 - benign, left side  . Colonoscopy  multiple    2010  . Carotid artery surgery Left     reports that she has never smoked. She has never used smokeless tobacco. She reports that she does not drink alcohol or use illicit drugs. family history includes Coronary artery disease in her other; Hyperlipidemia in her other; Hypertension in her other. There is no history of Colon cancer. Allergies  Allergen Reactions  . Fosamax [Alendronate Sodium] Nausea And Vomiting  . Penicillins Hives   Current Outpatient Prescriptions on File Prior to Visit  Medication Sig Dispense Refill  . acetaminophen  (TYLENOL) 325 MG tablet Take 650 mg by mouth every 6 (six) hours as needed.      Marland Kitchen amLODipine-benazepril (LOTREL) 5-40 MG per capsule Take 1 capsule by mouth daily. 30 capsule 11  . atorvastatin (LIPITOR) 20 MG tablet Take 1 tablet (20 mg total) by mouth daily at 6 PM. 30 tablet 11  . clopidogrel (PLAVIX) 75 MG tablet TAKE 1 TABLET BY MOUTH DAILY 90 tablet 2  . Fluticasone-Salmeterol (ADVAIR DISKUS) 250-50 MCG/DOSE AEPB Inhale 1 puff into the lungs 2 (two) times daily. 60 each 11  . hydrochlorothiazide (MICROZIDE) 12.5 MG capsule Take 1 capsule (12.5 mg total) by mouth daily. 90 capsule 3  . montelukast (SINGULAIR) 10 MG tablet TAKE 1 TABLET BY MOUTH ONE TIME DAILY 90 tablet 2  . traZODone (DESYREL) 50 MG tablet TAKE 1/2 TO 1 TABLET BY MOUTH AT BEDTIME AS NEEDED FOR SLEEP 90 tablet 1  . zolpidem (AMBIEN) 10 MG tablet Take 1 tablet (10 mg total) by mouth daily as needed. 30 tablet 5  . fexofenadine (ALLEGRA) 180 MG tablet Take 1 tablet (180 mg total) by mouth daily. 90 tablet 3  . levalbuterol (XOPENEX HFA) 45 MCG/ACT inhaler Inhale 2 puffs into the lungs every 6 (six) hours as needed for wheezing. 1 Inhaler 11   No current facility-administered medications on file prior to visit.   Review of Systems  Constitutional: Negative for unusual diaphoresis or night sweats HENT: Negative for ringing in ear or discharge  Eyes: Negative for double vision or worsening visual disturbance.  Respiratory: Negative for choking and stridor.   Gastrointestinal: Negative for vomiting or other signifcant bowel change Genitourinary: Negative for hematuria or change in urine volume.  Musculoskeletal: Negative for other MSK pain or swelling Skin: Negative for color change and worsening wound.  Neurological: Negative for tremors and numbness other than noted  Psychiatric/Behavioral: Negative for decreased concentration or agitation other than above       Objective:   Physical Exam BP 142/88 mmHg  Pulse 78   Temp(Src) 98.5 F (36.9 C) (Oral)  Resp 18  Ht 4\' 11"  (1.499 m)  Wt 145 lb 1.9 oz (65.826 kg)  BMI 29.30 kg/m2  SpO2 95% VS noted,  Constitutional: Pt appears in no significant distress HENT: Head: NCAT.  Right Ear: External ear normal.  Left Ear: External ear normal.  Eyes: . Pupils are equal, round, and reactive to light. Conjunctivae and EOM are normal Neck: Normal range of motion. Neck supple.  Cardiovascular: Normal rate and regular rhythm.   Pulmonary/Chest: Effort normal and breath sounds without rales or wheezing.  Spine nontender Mild tender left trapezoid area Left shoulder with marked crepitus and pain with forward elev and abduction Neurological: Pt is alert. Not confused , motor grossly intact Skin: Skin is warm. No rash, no LE edema Psychiatric: Pt behavior is normal. No agitation.     Assessment & Plan:

## 2015-04-09 NOTE — Assessment & Plan Note (Signed)
I suspect significant DJD and/or trapezoid strain, for pain control, flexeril prn, and predpac asd, consider f/u Dr Smith/sport med

## 2015-05-08 ENCOUNTER — Other Ambulatory Visit: Payer: Self-pay

## 2015-05-08 DIAGNOSIS — Z1231 Encounter for screening mammogram for malignant neoplasm of breast: Secondary | ICD-10-CM

## 2015-05-24 ENCOUNTER — Other Ambulatory Visit: Payer: Self-pay | Admitting: Internal Medicine

## 2015-06-06 ENCOUNTER — Ambulatory Visit (INDEPENDENT_AMBULATORY_CARE_PROVIDER_SITE_OTHER): Payer: BC Managed Care – PPO | Admitting: Internal Medicine

## 2015-06-06 DIAGNOSIS — I10 Essential (primary) hypertension: Secondary | ICD-10-CM | POA: Diagnosis not present

## 2015-06-06 DIAGNOSIS — G4700 Insomnia, unspecified: Secondary | ICD-10-CM | POA: Diagnosis not present

## 2015-06-06 DIAGNOSIS — F5104 Psychophysiologic insomnia: Secondary | ICD-10-CM

## 2015-06-06 MED ORDER — SUVOREXANT 20 MG PO TABS: 20.0000 mg | ORAL_TABLET | Freq: Every evening | ORAL | Status: DC | PRN

## 2015-06-06 NOTE — Patient Instructions (Addendum)
Please take all new medication as prescribed - the belsomra  If not working well, we can try the PA again for the Ambien  Please continue all other medications as before, and refills have been done if requested.  Please have the pharmacy call with any other refills you may need.  Please keep your appointments with your specialists as you may have planned

## 2015-06-06 NOTE — Progress Notes (Signed)
Subjective:    Patient ID: [name] [name], female    DOB: 24-Nov-1951, 64 y.o.   MRN: [id number]  HPI  Here to f/u with c/o persistent insomnia, with difficulty not so much with getting to sleep, but staying asleep as well, Denies worsening depressive symptoms, suicidal ideation, or panic; has ongoing stressors.  Pt denies chest pain, increased sob or doe, wheezing, orthopnea, PND, increased LE swelling, palpitations, dizziness or syncope.  Pt denies new neurological symptoms such as new headache, or facial or extremity weakness or numbness  Has had ambien denied on PA by her insurance in dec 2015, but not tried this calendar yr.  Pt is willing to try Belsomra.  Has not done well with trazodone.  Past Medical History  Diagnosis Date  . HYPOTHYROIDISM 11/06/2007  . HYPERLIPIDEMIA 11/03/2010  . HYPERTENSION 09/13/2007  . ALLERGIC RHINITIS 11/06/2007  . ASTHMA 09/13/2007  . PERIMENOPAUSAL STATUS 09/13/2007  . OSTEOPOROSIS 11/06/2007  . ALCOHOL ABUSE, HX OF 11/06/2007  . COLONIC POLYPS, HX OF 02/09/2008     ADENOMATOUS POLYP  . INSOMNIA, HX OF 09/13/2007  . Anxiety 04/03/2011  . Stroke   . TIA (transient ischemic attack) 11/04/2011  . Impaired glucose tolerance 07/30/2013   Past Surgical History  Procedure Laterality Date  . Cesarean section      x 3  . Breast surgery  2008 and 2012    x 2 - benign, left side  . Colonoscopy  multiple    2010  . Carotid artery surgery Left     reports that she has never smoked. She has never used smokeless tobacco. She reports that she does not drink alcohol or use illicit drugs. family history includes Coronary artery disease in her other; Hyperlipidemia in her other; Hypertension in her other. There is no history of Colon cancer. Allergies  Allergen Reactions  . Fosamax [Alendronate Sodium] Nausea And Vomiting  . Penicillins Hives   Current Outpatient Prescriptions on File Prior to Visit  Medication Sig Dispense Refill  . acetaminophen (TYLENOL) 325  MG tablet Take 650 mg by mouth every 6 (six) hours as needed.      Marland Kitchen amLODipine-benazepril (LOTREL) 5-40 MG per capsule Take 1 capsule by mouth daily. 30 capsule 11  . atorvastatin (LIPITOR) 20 MG tablet Take 1 tablet (20 mg total) by mouth daily at 6 PM. 30 tablet 11  . clopidogrel (PLAVIX) 75 MG tablet TAKE 1 TABLET BY MOUTH DAILY 90 tablet 2  . fexofenadine (ALLEGRA) 180 MG tablet Take 1 tablet (180 mg total) by mouth daily. 90 tablet 3  . Fluticasone-Salmeterol (ADVAIR DISKUS) 250-50 MCG/DOSE AEPB Inhale 1 puff into the lungs 2 (two) times daily. 60 each 11  . hydrochlorothiazide (MICROZIDE) 12.5 MG capsule Take 1 capsule (12.5 mg total) by mouth daily. 90 capsule 3  . levalbuterol (XOPENEX HFA) 45 MCG/ACT inhaler Inhale 2 puffs into the lungs every 6 (six) hours as needed for wheezing. 1 Inhaler 11  . montelukast (SINGULAIR) 10 MG tablet TAKE 1 TABLET BY MOUTH EVERY DAY 90 tablet 1  . zolpidem (AMBIEN) 10 MG tablet Take 1 tablet (10 mg total) by mouth daily as needed. 30 tablet 5   No current facility-administered medications on file prior to visit.   Review of Systems All otherwise neg per pt     Objective:   Physical Exam There were no vitals taken for this visit. VS noted,  Constitutional: Pt appears in no significant distress HENT: Head: NCAT.  Right Ear: External  ear normal.  Left Ear: External ear normal.  Eyes: . Pupils are equal, round, and reactive to light. Conjunctivae and EOM are normal Neck: Normal range of motion. Neck supple.  Cardiovascular: Normal rate and regular rhythm.   Pulmonary/Chest: Effort normal and breath sounds without rales or wheezing.  Neurological: Pt is alert. Not confused , motor grossly intact Skin: Skin is warm. No rash, no LE edema Psychiatric: Pt behavior is normal. No agitation. 1+ nervous     Assessment & Plan:

## 2015-06-08 ENCOUNTER — Encounter: Payer: Self-pay | Admitting: Internal Medicine

## 2015-06-08 NOTE — Assessment & Plan Note (Signed)
[place] for trial belsomra prn if ok with insurance, consider re-try [place] prior auth for 2016 if not able to try belsomra

## 2015-06-08 NOTE — Assessment & Plan Note (Signed)
stable overall by history and exam, recent data reviewed with pt, and pt to continue medical treatment as before,  to f/u any worsening symptoms or concerns BP Readings from Last 3 Encounters:  04/09/15 142/88  10/10/14 162/100  02/02/14 144/90

## 2015-06-12 ENCOUNTER — Telehealth: Payer: Self-pay | Admitting: Internal Medicine

## 2015-06-12 NOTE — Telephone Encounter (Signed)
Patient states Dr. [name] sent in a script for a sleeping med (med is not [place] but does not know the name of it).  Patient would like to check on the PA process.  Patient uses [facility] on [address].   Patient states she left a message on someone's VM last week but has not received a call back in regards.

## 2015-06-13 ENCOUNTER — Ambulatory Visit: Payer: BC Managed Care – PPO

## 2015-06-19 MED ORDER — ZOLPIDEM TARTRATE 10 MG PO TABS: 10.0000 mg | ORAL_TABLET | Freq: Every day | ORAL | Status: DC | PRN

## 2015-06-19 NOTE — Telephone Encounter (Signed)
Left message on machine informing pt of same, Rx faxed to pharmacy

## 2015-06-19 NOTE — Telephone Encounter (Signed)
Pt has failed trazodone  Pt had a PA for ambien denied dec 2015, and now a PA denied for belsomra in 2016  OK to re-try [place] as apparently this may now be covered since it is required to be tried before PA for belsomra can be approved  Done hardcopy to dahlia - for [facility]

## 2015-06-19 NOTE — Telephone Encounter (Signed)
Belsomra not covered by pt's insurance unless tried and failed with both Ambien and Trazodone, please advise

## 2015-06-20 ENCOUNTER — Telehealth: Payer: Self-pay | Admitting: Internal Medicine

## 2015-06-20 NOTE — Telephone Encounter (Signed)
Patient stated that [employer] received the Prior Auth for medication Ambien, please advise

## 2015-06-21 ENCOUNTER — Ambulatory Visit: Payer: BC Managed Care – PPO

## 2015-06-21 NOTE — Telephone Encounter (Signed)
Starting PA for belsomra per pt request.

## 2015-06-26 ENCOUNTER — Ambulatory Visit: Payer: BC Managed Care – PPO

## 2015-06-26 NOTE — Telephone Encounter (Signed)
LVM for pt to call back as soon as possible.   RE: Note below.   Pharmacy contacted.

## 2015-06-26 NOTE — Telephone Encounter (Signed)
belsomra PA approved.

## 2015-06-26 NOTE — Addendum Note (Signed)
Addended by: Lowella Dandy on: 06/26/2015 02:54 PM   Modules accepted: Orders, Medications

## 2015-07-03 ENCOUNTER — Encounter: Payer: Self-pay | Admitting: Internal Medicine

## 2015-07-03 ENCOUNTER — Ambulatory Visit (INDEPENDENT_AMBULATORY_CARE_PROVIDER_SITE_OTHER): Payer: BC Managed Care – PPO | Admitting: Internal Medicine

## 2015-07-03 ENCOUNTER — Other Ambulatory Visit (INDEPENDENT_AMBULATORY_CARE_PROVIDER_SITE_OTHER): Payer: BC Managed Care – PPO

## 2015-07-03 VITALS — BP 128/72 | HR 76 | Temp 97.9°F | Ht 59.0 in | Wt 148.2 lb

## 2015-07-03 DIAGNOSIS — Z23 Encounter for immunization: Secondary | ICD-10-CM | POA: Diagnosis not present

## 2015-07-03 DIAGNOSIS — R739 Hyperglycemia, unspecified: Secondary | ICD-10-CM

## 2015-07-03 DIAGNOSIS — Z0189 Encounter for other specified special examinations: Secondary | ICD-10-CM | POA: Diagnosis not present

## 2015-07-03 DIAGNOSIS — Z0000 Encounter for general adult medical examination without abnormal findings: Secondary | ICD-10-CM | POA: Diagnosis not present

## 2015-07-03 DIAGNOSIS — F5104 Psychophysiologic insomnia: Secondary | ICD-10-CM

## 2015-07-03 LAB — TSH: TSH: 2.65 u[IU]/mL (ref 0.35–4.50)

## 2015-07-03 LAB — BASIC METABOLIC PANEL
BUN: 13 mg/dL (ref 6–23)
CO2: 32 mEq/L (ref 19–32)
Calcium: 9.5 mg/dL (ref 8.4–10.5)
Chloride: 100 mEq/L (ref 96–112)
Creatinine, Ser: 0.7 mg/dL (ref 0.40–1.20)
GFR: 108.62 mL/min (ref >60.00)
GLUCOSE: 99 mg/dL (ref 70–99)
Potassium: 3.4 mEq/L — ABNORMAL LOW (ref 3.5–5.1)
SODIUM: 139 meq/L (ref 135–145)

## 2015-07-03 LAB — CBC WITH DIFFERENTIAL/PLATELET
Basophils Absolute: 0 10*3/uL (ref 0.0–0.1)
Basophils Relative: 0.3 % (ref 0.0–3.0)
Eosinophils Absolute: 0.1 10*3/uL (ref 0.0–0.7)
Eosinophils Relative: 1.6 % (ref 0.0–5.0)
HEMATOCRIT: 40.7 % (ref 36.0–46.0)
Hemoglobin: 13.4 g/dL (ref 12.0–15.0)
Lymphocytes Relative: 31.9 % (ref 12.0–46.0)
Lymphs Abs: 2.3 10*3/uL (ref 0.7–4.0)
MCHC: 32.8 g/dL (ref 30.0–36.0)
MCV: 93 fl (ref 78.0–100.0)
Monocytes Absolute: 0.7 10*3/uL (ref 0.1–1.0)
Monocytes Relative: 9.2 % (ref 3.0–12.0)
NEUTROS ABS: 4.1 10*3/uL (ref 1.4–7.7)
Neutrophils Relative %: 57 % (ref 43.0–77.0)
Platelets: 229 10*3/uL (ref 150.0–400.0)
RBC: 4.38 Mil/uL (ref 3.87–5.11)
RDW: 14.2 % (ref 11.5–15.5)
WBC: 7.2 10*3/uL (ref 4.0–10.5)

## 2015-07-03 LAB — URINALYSIS, ROUTINE W REFLEX MICROSCOPIC
Bilirubin Urine: NEGATIVE
HGB URINE DIPSTICK: NEGATIVE
Ketones, ur: NEGATIVE
LEUKOCYTES UA: NEGATIVE
NITRITE: NEGATIVE
Specific Gravity, Urine: 1.01 (ref 1.000–1.030)
TOTAL PROTEIN, URINE-UPE24: NEGATIVE
URINE GLUCOSE: NEGATIVE
UROBILINOGEN UA: 0.2 (ref 0.0–1.0)
pH: 6 (ref 5.0–8.0)

## 2015-07-03 LAB — HEPATIC FUNCTION PANEL
ALT: 10 U/L (ref 0–35)
AST: 13 U/L (ref 0–37)
Albumin: 4.3 g/dL (ref 3.5–5.2)
Alkaline Phosphatase: 108 U/L (ref 39–117)
Bilirubin, Direct: 0.1 mg/dL (ref 0.0–0.3)
TOTAL PROTEIN: 7.3 g/dL (ref 6.0–8.3)
Total Bilirubin: 0.5 mg/dL (ref 0.2–1.2)

## 2015-07-03 LAB — LIPID PANEL
Cholesterol: 184 mg/dL (ref 0–200)
HDL: 60.2 mg/dL (ref >39.00)
LDL Cholesterol: 110 mg/dL — ABNORMAL HIGH (ref 0–99)
NonHDL: 123.8
Total CHOL/HDL Ratio: 3
Triglycerides: 69 mg/dL (ref 0.0–149.0)
VLDL: 13.8 mg/dL (ref 0.0–40.0)

## 2015-07-03 LAB — HEMOGLOBIN A1C: Hgb A1c MFr Bld: 6.2 % (ref 4.6–6.5)

## 2015-07-03 MED ORDER — ZOLPIDEM TARTRATE 10 MG PO TABS: 10.0000 mg | ORAL_TABLET | Freq: Every evening | ORAL | Status: DC | PRN

## 2015-07-03 NOTE — Patient Instructions (Addendum)
You had the new Prevnar pneumonia shot today  Please continue all other medications as before, and refills have been done if requested - the ambien  Please have the pharmacy call with any other refills you may need.  Please continue your efforts at being more active, low cholesterol diet, and weight control.  You are otherwise up to date with prevention measures today.  Please keep your appointments with your specialists as you may have planned  Please go to the LAB in the Basement (turn left off the elevator) for the tests to be done today  You will be contacted by phone if any changes need to be made immediately.  Otherwise, you will receive a letter about your results with an explanation, but please check with MyChart first.  Please remember to sign up for MyChart if you have not done so, as this will be important to you in the future with finding out test results, communicating by private email, and scheduling acute appointments online when needed.  Please return in 1 year for your yearly visit, or sooner if needed, with Lab testing done 3-5 days before

## 2015-07-03 NOTE — Progress Notes (Signed)
Subjective:    Patient ID: [name] [name], female    DOB: 25-Apr-1951, 64 y.o.   MRN: [id number]  HPI  Here for wellness and f/u;  Overall doing ok;  Pt denies Chest pain, worsening SOB, DOE, wheezing, orthopnea, PND, worsening LE edema, palpitations, dizziness or syncope.  Pt denies neurological change such as new headache, facial or extremity weakness.  Pt denies polydipsia, polyuria, or low sugar symptoms. Pt states overall good compliance with treatment and medications, good tolerability, and has been trying to follow appropriate diet.  Pt denies worsening depressive symptoms, suicidal ideation or panic. No fever, night sweats, wt loss, loss of appetite, or other constitutional symptoms.  Pt states good ability with ADL's, has low fall risk, home safety reviewed and adequate, no other significant changes in hearing or vision, and only occasionally active with exercise.  Belsomra did not work for her, asking to change back to [place] Past Medical History  Diagnosis Date  . HYPOTHYROIDISM 11/06/2007  . HYPERLIPIDEMIA 11/03/2010  . HYPERTENSION 09/13/2007  . ALLERGIC RHINITIS 11/06/2007  . ASTHMA 09/13/2007  . PERIMENOPAUSAL STATUS 09/13/2007  . OSTEOPOROSIS 11/06/2007  . ALCOHOL ABUSE, HX OF 11/06/2007  . COLONIC POLYPS, HX OF 02/09/2008     ADENOMATOUS POLYP  . INSOMNIA, HX OF 09/13/2007  . Anxiety 04/03/2011  . Stroke   . TIA (transient ischemic attack) 11/04/2011  . Impaired glucose tolerance 07/30/2013   Past Surgical History  Procedure Laterality Date  . Cesarean section      x 3  . Breast surgery  2008 and 2012    x 2 - benign, left side  . Colonoscopy  multiple    2010  . Carotid artery surgery Left     reports that she has never smoked. She has never used smokeless tobacco. She reports that she does not drink alcohol or use illicit drugs. family history includes Coronary artery disease in her other; Hyperlipidemia in her other; Hypertension in her other. There is no history of  Colon cancer. Allergies  Allergen Reactions  . Fosamax [Alendronate Sodium] Nausea And Vomiting  . Penicillins Hives   Current Outpatient Prescriptions on File Prior to Visit  Medication Sig Dispense Refill  . acetaminophen (TYLENOL) 325 MG tablet Take 650 mg by mouth every 6 (six) hours as needed.      Marland Kitchen amLODipine-benazepril (LOTREL) 5-40 MG per capsule Take 1 capsule by mouth daily. 30 capsule 11  . atorvastatin (LIPITOR) 20 MG tablet Take 1 tablet (20 mg total) by mouth daily at 6 PM. 30 tablet 11  . clopidogrel (PLAVIX) 75 MG tablet TAKE 1 TABLET BY MOUTH DAILY 90 tablet 2  . Fluticasone-Salmeterol (ADVAIR DISKUS) 250-50 MCG/DOSE AEPB Inhale 1 puff into the lungs 2 (two) times daily. 60 each 11  . hydrochlorothiazide (MICROZIDE) 12.5 MG capsule Take 1 capsule (12.5 mg total) by mouth daily. 90 capsule 3  . montelukast (SINGULAIR) 10 MG tablet TAKE 1 TABLET BY MOUTH EVERY DAY 90 tablet 1  . fexofenadine (ALLEGRA) 180 MG tablet Take 1 tablet (180 mg total) by mouth daily. 90 tablet 3  . levalbuterol (XOPENEX HFA) 45 MCG/ACT inhaler Inhale 2 puffs into the lungs every 6 (six) hours as needed for wheezing. 1 Inhaler 11   No current facility-administered medications on file prior to visit.   Review of Systems Constitutional: Negative for increased diaphoresis, other activity, appetite or siginficant weight change other than noted HENT: Negative for worsening hearing loss, ear pain, facial swelling, mouth sores and  neck stiffness.   Eyes: Negative for other worsening pain, redness or visual disturbance.  Respiratory: Negative for shortness of breath and wheezing  Cardiovascular: Negative for chest pain and palpitations.  Gastrointestinal: Negative for diarrhea, blood in stool, abdominal distention or other pain Genitourinary: Negative for hematuria, flank pain or change in urine volume.  Musculoskeletal: Negative for myalgias or other joint complaints.  Skin: Negative for color change and  wound or drainage.  Neurological: Negative for syncope and numbness. other than noted Hematological: Negative for adenopathy. or other swelling Psychiatric/Behavioral: Negative for hallucinations, SI, self-injury, decreased concentration or other worsening agitation.      Objective:   Physical Exam BP 128/72 mmHg  Pulse 76  Temp(Src) 97.9 F (36.6 C) (Oral)  Ht 4\' 11"  (1.499 m)  Wt 148 lb 4 oz (67.246 kg)  BMI 29.93 kg/m2  SpO2 96% VS noted,  Constitutional: Pt is oriented to person, place, and time. Appears well-developed and well-nourished, in no significant distress Head: Normocephalic and atraumatic.  Right Ear: External ear normal.  Left Ear: External ear normal.  Nose: Nose normal.  Mouth/Throat: Oropharynx is clear and moist.  Eyes: Conjunctivae and EOM are normal. Pupils are equal, round, and reactive to light.  Neck: Normal range of motion. Neck supple. No JVD present. No tracheal deviation present or significant neck LA or mass Cardiovascular: Normal rate, regular rhythm, normal heart sounds and intact distal pulses.   Pulmonary/Chest: Effort normal and breath sounds without rales or wheezing  Abdominal: Soft. Bowel sounds are normal. NT. No HSM  Musculoskeletal: Normal range of motion. Exhibits no edema.  Lymphadenopathy:  Has no cervical adenopathy.  Neurological: Pt is alert and oriented to person, place, and time. Pt has normal reflexes. No cranial nerve deficit. Motor grossly intact Skin: Skin is warm and dry. No rash noted.  Psychiatric:  Has normal mood and affect. Behavior is normal.      Assessment & Plan:

## 2015-07-03 NOTE — Assessment & Plan Note (Signed)
Ok for trial change back to [employer] 10 mg qhs prn,  to f/u any worsening symptoms or concerns

## 2015-07-03 NOTE — Assessment & Plan Note (Signed)

## 2015-07-03 NOTE — Progress Notes (Signed)
Pre visit review using our clinic review tool, if applicable. No additional management support is needed unless otherwise documented below in the visit note. 

## 2015-07-09 ENCOUNTER — Telehealth: Payer: Self-pay | Admitting: Internal Medicine

## 2015-07-09 NOTE — Telephone Encounter (Signed)
[name] [name] patient ask you to call her, she give me any reason. [phone]

## 2015-07-10 ENCOUNTER — Ambulatory Visit
Admission: RE | Admit: 2015-07-10 | Discharge: 2015-07-10 | Disposition: A | Discharge status: Home / Self Care | Payer: BC Managed Care – PPO | Source: Ambulatory Visit

## 2015-07-10 DIAGNOSIS — Z1231 Encounter for screening mammogram for malignant neoplasm of breast: Secondary | ICD-10-CM

## 2015-07-11 ENCOUNTER — Telehealth: Payer: Self-pay

## 2015-07-11 NOTE — Telephone Encounter (Signed)
Informed pt that PA is still in process. Needed to be resent on the 25th of July without reason from the insurance company.

## 2015-07-11 NOTE — Telephone Encounter (Signed)
Pa initiated via covermymeds KEY: [id number] Script: Zolpidem Resubmitted 07/08/15 viacovermymeds received message that PA needed to be resubmitted due to ins? KEY: [id number] Check status on PA 7/28 : Still in processing

## 2015-07-12 NOTE — Telephone Encounter (Signed)
Checked status today. D/L form needed provider sign and then faxed. Handed To DB to obtain Dr. [name] sig awaiting response.

## 2015-07-16 ENCOUNTER — Telehealth: Payer: Self-pay

## 2015-07-16 NOTE — Telephone Encounter (Signed)
Pt called and wanted to know the status of the Ambien PA. Pt stated that pharmacy called and that the rx was ready for pick up.  I called the pharmacy and they did not have anything ready for pick up and the pharmacist stated that the rx still required a PA.   Informed pt of the same. Pt stated that she was told it was ready for pick up. I apologized for any mixed information that either of [place] was given and told her we would call as soon as we knew something.

## 2015-07-18 NOTE — Telephone Encounter (Signed)
PA approved til 07/17/2016

## 2015-07-18 NOTE — Telephone Encounter (Signed)
Pt called in and just want to check on the status of the PA.  She would like to see if you could call her back when you get a chance

## 2015-08-28 ENCOUNTER — Other Ambulatory Visit: Payer: Self-pay

## 2015-08-28 MED ORDER — ATORVASTATIN CALCIUM 20 MG PO TABS: 20.0000 mg | ORAL_TABLET | Freq: Every day | ORAL | Status: DC

## 2015-10-16 ENCOUNTER — Encounter: Payer: BC Managed Care – PPO | Admitting: Internal Medicine

## 2015-10-27 ENCOUNTER — Other Ambulatory Visit: Payer: Self-pay | Admitting: Internal Medicine

## 2015-12-12 ENCOUNTER — Other Ambulatory Visit: Payer: Self-pay | Admitting: Internal Medicine

## 2015-12-20 ENCOUNTER — Other Ambulatory Visit: Payer: Self-pay | Admitting: Internal Medicine

## 2015-12-20 NOTE — Telephone Encounter (Signed)
Done hardcopy to Dahlia  

## 2015-12-24 NOTE — Telephone Encounter (Signed)
Rx faxed to pharmacy  

## 2016-01-15 ENCOUNTER — Telehealth: Payer: Self-pay | Admitting: Internal Medicine

## 2016-01-15 NOTE — Telephone Encounter (Signed)
Patient is having issues with PA on Ambien.  Please call back in regards.

## 2016-01-16 NOTE — Telephone Encounter (Signed)
Pt had a previous PA approved from 07/2015 - 07/2016.  Pt called back with insurance information - ID [id number] GRP [id number] BIN [id number]  Second PA initiated via CoverMyMeds Key [id number]

## 2016-01-20 NOTE — Telephone Encounter (Signed)
PA Approved thru 01/20/2016-01/19/2019 code: [id number]. Pharmacy advised

## 2016-01-28 ENCOUNTER — Ambulatory Visit (INDEPENDENT_AMBULATORY_CARE_PROVIDER_SITE_OTHER): Payer: BC Managed Care – PPO | Admitting: Internal Medicine

## 2016-01-28 VITALS — BP 144/84 | HR 78 | Temp 98.3°F | Ht 59.0 in | Wt 146.0 lb

## 2016-01-28 DIAGNOSIS — R05 Cough: Secondary | ICD-10-CM

## 2016-01-28 DIAGNOSIS — I10 Essential (primary) hypertension: Secondary | ICD-10-CM

## 2016-01-28 DIAGNOSIS — Z0000 Encounter for general adult medical examination without abnormal findings: Secondary | ICD-10-CM

## 2016-01-28 DIAGNOSIS — Z0189 Encounter for other specified special examinations: Secondary | ICD-10-CM

## 2016-01-28 DIAGNOSIS — R059 Cough, unspecified: Secondary | ICD-10-CM | POA: Insufficient documentation

## 2016-01-28 DIAGNOSIS — M79605 Pain in left leg: Secondary | ICD-10-CM | POA: Diagnosis not present

## 2016-01-28 MED ORDER — IBUPROFEN 800 MG PO TABS: 800.0000 mg | ORAL_TABLET | Freq: Three times a day (TID) | ORAL | Status: DC | PRN

## 2016-01-28 MED ORDER — AMLODIPINE-OLMESARTAN 10-40 MG PO TABS: 1.0000 | ORAL_TABLET | Freq: Every day | ORAL | Status: DC

## 2016-01-28 NOTE — Progress Notes (Signed)
Pre visit review using our clinic review tool, if applicable. No additional management support is needed unless otherwise documented below in the visit note. 

## 2016-01-28 NOTE — Assessment & Plan Note (Signed)
Exam benign, suspect msk strain, for nsaid prn,  to f/u any worsening symptoms or concerns

## 2016-01-28 NOTE — Assessment & Plan Note (Signed)
Hopefully to improve with change of ACE to ARB,  to f/u any worsening symptoms or concerns

## 2016-01-28 NOTE — Progress Notes (Signed)
Subjective:    Patient ID: [name] [name], female    DOB: December 23, 1950, 65 y.o.   MRN: [id number]  HPI  Here to f/u; overall doing ok,  Pt denies chest pain, increasing sob or doe, wheezing, orthopnea, PND, increased LE swelling, palpitations, dizziness or syncope.  Pt denies new neurological symptoms such as new headache, or facial or extremity weakness or numbness.  Pt denies polydipsia, polyuria, or low sugar episode.   Pt denies new neurological symptoms such as new headache, or facial or extremity weakness or numbness.   Pt states overall good compliance with meds, mostly trying to follow appropriate diet, with wt overall stable.  BP has been mild elevated over the past weekend, daughter is nurse.  Overall good compliance with treatment, and good medicine tolerability. Has had also recent Several nosebleeds.  Also with chronic cough x months - ? lotrel related. Does have mild occas nasal allergy symtpoms, and Denies worsening reflux, abd pain, dysphagia, n/v, bowel change or blood.  Also Has been taking ibuprofen for leg pain without LBP, swelling or redness. Past Medical History  Diagnosis Date  . HYPOTHYROIDISM 11/06/2007  . HYPERLIPIDEMIA 11/03/2010  . HYPERTENSION 09/13/2007  . ALLERGIC RHINITIS 11/06/2007  . ASTHMA 09/13/2007  . PERIMENOPAUSAL STATUS 09/13/2007  . OSTEOPOROSIS 11/06/2007  . ALCOHOL ABUSE, HX OF 11/06/2007  . COLONIC POLYPS, HX OF 02/09/2008     ADENOMATOUS POLYP  . INSOMNIA, HX OF 09/13/2007  . Anxiety 04/03/2011  . Stroke   . TIA (transient ischemic attack) 11/04/2011  . Impaired glucose tolerance 07/30/2013   Past Surgical History  Procedure Laterality Date  . Cesarean section      x 3  . Breast surgery  2008 and 2012    x 2 - benign, left side  . Colonoscopy  multiple    2010  . Carotid artery surgery Left     reports that she has never smoked. She has never used smokeless tobacco. She reports that she does not drink alcohol or use illicit drugs. family  history includes Coronary artery disease in her other; Hyperlipidemia in her other; Hypertension in her other. There is no history of Colon cancer. Allergies  Allergen Reactions  . Fosamax [Alendronate Sodium] Nausea And Vomiting  . Penicillins Hives   Current Outpatient Prescriptions on File Prior to Visit  Medication Sig Dispense Refill  . acetaminophen (TYLENOL) 325 MG tablet Take 650 mg by mouth every 6 (six) hours as needed.      Marland Kitchen atorvastatin (LIPITOR) 20 MG tablet Take 1 tablet (20 mg total) by mouth daily at 6 PM. 30 tablet 11  . clopidogrel (PLAVIX) 75 MG tablet TAKE 1 TABLET BY MOUTH DAILY 90 tablet 2  . Fluticasone-Salmeterol (ADVAIR DISKUS) 250-50 MCG/DOSE AEPB Inhale 1 puff into the lungs 2 (two) times daily. 60 each 11  . hydrochlorothiazide (MICROZIDE) 12.5 MG capsule TAKE ONE CAPSULE BY MOUTH EVERY DAY 90 capsule 2  . montelukast (SINGULAIR) 10 MG tablet TAKE 1 TABLET BY MOUTH EVERY DAY 90 tablet 1  . zolpidem (AMBIEN) 10 MG tablet Take 1 tablet (10 mg total) by mouth at bedtime as needed for sleep. 90 tablet 1  . fexofenadine (ALLEGRA) 180 MG tablet Take 1 tablet (180 mg total) by mouth daily. 90 tablet 3  . levalbuterol (XOPENEX HFA) 45 MCG/ACT inhaler Inhale 2 puffs into the lungs every 6 (six) hours as needed for wheezing. 1 Inhaler 11   No current facility-administered medications on file prior to visit.  Review of Systems  Constitutional: Negative for unusual diaphoresis or night sweats HENT: Negative for ringing in ear or discharge Eyes: Negative for double vision or worsening visual disturbance.  Respiratory: Negative for choking and stridor.   Gastrointestinal: Negative for vomiting or other signifcant bowel change Genitourinary: Negative for hematuria or change in urine volume.  Musculoskeletal: Negative for other MSK pain or swelling Skin: Negative for color change and worsening wound.  Neurological: Negative for tremors and numbness other than noted    Psychiatric/Behavioral: Negative for decreased concentration or agitation other than above       Objective:   Physical Exam BP 144/84 mmHg  Pulse 78  Temp(Src) 98.3 F (36.8 C) (Oral)  Ht 4\' 11"  (1.499 m)  Wt 146 lb (66.225 kg)  BMI 29.47 kg/m2  SpO2 94% VS noted,  Constitutional: Pt appears in no significant distress HENT: Head: NCAT.  Right Ear: External ear normal.  Left Ear: External ear normal.  Eyes: . Pupils are equal, round, and reactive to light. Conjunctivae and EOM are normal Neck: Normal range of motion. Neck supple.  Cardiovascular: Normal rate and regular rhythm.   Pulmonary/Chest: Effort normal and breath sounds without rales or wheezing.  Abd:  Soft, NT, ND, + BS Neurological: Pt is alert. Not confused , motor grossly intact Skin: Skin is warm. No rash, no LE edema Psychiatric: Pt behavior is normal. No agitation.     Assessment & Plan:

## 2016-01-28 NOTE — Assessment & Plan Note (Signed)
Mild uncontrolled, to change the lotrel to azor 10/40, cont to monitor BP at home,  to f/u any worsening symptoms or concerns BP Readings from Last 3 Encounters:  01/28/16 144/84  07/03/15 128/72  04/09/15 142/88

## 2016-01-28 NOTE — Patient Instructions (Addendum)
OK to stop the lotrel  Please take all new medication as prescribed - the generic Azor 10/40 mg - 1 per day  Please continue all other medications as before, and refills have been done if requested - the ibuprofen  Please have the pharmacy call with any other refills you may need.  Please continue your efforts at being more active, low cholesterol diet, and weight control  Please keep your appointments with your specialists as you may have planned  Please return in 3 months, or sooner if needed, with Lab testing done 3-5 days before

## 2016-03-22 ENCOUNTER — Other Ambulatory Visit: Payer: Self-pay | Admitting: Internal Medicine

## 2016-03-23 NOTE — Telephone Encounter (Signed)
Please advise, thanks.

## 2016-04-19 ENCOUNTER — Other Ambulatory Visit: Payer: Self-pay | Admitting: Internal Medicine

## 2016-04-20 NOTE — Telephone Encounter (Signed)
Please advise, thanks.

## 2016-04-20 NOTE — Telephone Encounter (Signed)
Refill for ibuprofen done, but pt to make rov for further refills due to risk of taking this medication long term

## 2016-06-15 ENCOUNTER — Other Ambulatory Visit: Payer: Self-pay | Admitting: Internal Medicine

## 2016-06-15 DIAGNOSIS — Z139 Encounter for screening, unspecified: Secondary | ICD-10-CM

## 2016-06-20 ENCOUNTER — Other Ambulatory Visit: Payer: Self-pay | Admitting: Internal Medicine

## 2016-06-23 ENCOUNTER — Telehealth: Payer: Self-pay | Admitting: Internal Medicine

## 2016-06-30 NOTE — Telephone Encounter (Signed)
Pt wants to know what is going on with this refill?

## 2016-07-02 NOTE — Telephone Encounter (Signed)
Please see phone note 01/15/2016

## 2016-07-02 NOTE — Telephone Encounter (Signed)
Please advise 

## 2016-07-02 NOTE — Telephone Encounter (Signed)
Pt call back stating Ambien need to have PA. Please check and call pt back on this.

## 2016-07-02 NOTE — Telephone Encounter (Signed)
Left message advising patient of response, asked her to call back

## 2016-07-06 NOTE — Telephone Encounter (Signed)
Patient called back in to advise that pharmacy is telling her that the [place] still needs PA. Referred to the note on 01/15/2016. Please call the pharmacy and update them with the information so that the patient can get refill.   Please give the patient a call to advise of progress- [phone]

## 2016-07-07 ENCOUNTER — Telehealth: Payer: Self-pay | Admitting: Internal Medicine

## 2016-07-07 NOTE — Telephone Encounter (Signed)
Pt called regard PA for zolpidem (AMBIEN) 10 MG tablet. Pharmacy still does not have the authorization, she really need to speak to the assistant [facility] always help her) if she could to get this solve. She is out of this med.

## 2016-07-08 ENCOUNTER — Other Ambulatory Visit: Payer: Self-pay | Admitting: Internal Medicine

## 2016-07-08 NOTE — Telephone Encounter (Signed)
See phone note 06/23/2016

## 2016-07-08 NOTE — Telephone Encounter (Signed)
PA renewed for Ambien via CoverMyMeds key [id number]

## 2016-07-08 NOTE — Telephone Encounter (Signed)
Done hardcopy to Corinne  

## 2016-07-09 NOTE — Telephone Encounter (Signed)
Faxed script back to [facility].../lmb 

## 2016-07-16 NOTE — Telephone Encounter (Signed)
Per pt, PA was NOT needed, medication required authorization for refill, Rx was sent in 07/08/2016 per pt.

## 2016-07-17 ENCOUNTER — Ambulatory Visit: Payer: BC Managed Care – PPO

## 2016-07-20 ENCOUNTER — Other Ambulatory Visit: Payer: Self-pay | Admitting: Internal Medicine

## 2016-07-21 ENCOUNTER — Other Ambulatory Visit: Payer: Self-pay | Admitting: Internal Medicine

## 2016-07-24 ENCOUNTER — Ambulatory Visit
Admission: RE | Admit: 2016-07-24 | Discharge: 2016-07-24 | Disposition: A | Discharge status: Home / Self Care | Payer: BC Managed Care – PPO | Source: Ambulatory Visit | Attending: Internal Medicine | Admitting: Internal Medicine

## 2016-07-24 DIAGNOSIS — Z139 Encounter for screening, unspecified: Secondary | ICD-10-CM

## 2016-08-26 ENCOUNTER — Other Ambulatory Visit: Payer: Self-pay | Admitting: Internal Medicine

## 2016-09-08 ENCOUNTER — Other Ambulatory Visit: Payer: Self-pay | Admitting: Internal Medicine

## 2016-09-09 NOTE — Telephone Encounter (Signed)
Done hardcopy to Corinne  

## 2016-09-09 NOTE — Telephone Encounter (Signed)
faxed

## 2016-09-26 ENCOUNTER — Other Ambulatory Visit: Payer: Self-pay | Admitting: Internal Medicine

## 2016-10-08 ENCOUNTER — Other Ambulatory Visit: Payer: Self-pay | Admitting: Internal Medicine

## 2016-10-25 ENCOUNTER — Other Ambulatory Visit: Payer: Self-pay | Admitting: Internal Medicine

## 2016-11-06 ENCOUNTER — Other Ambulatory Visit: Payer: Self-pay | Admitting: Internal Medicine

## 2016-12-15 ENCOUNTER — Ambulatory Visit (INDEPENDENT_AMBULATORY_CARE_PROVIDER_SITE_OTHER): Payer: BC Managed Care – PPO | Admitting: Internal Medicine

## 2016-12-15 ENCOUNTER — Other Ambulatory Visit: Payer: Self-pay | Admitting: Internal Medicine

## 2016-12-15 ENCOUNTER — Other Ambulatory Visit (INDEPENDENT_AMBULATORY_CARE_PROVIDER_SITE_OTHER): Payer: BC Managed Care – PPO

## 2016-12-15 ENCOUNTER — Encounter: Payer: Self-pay | Admitting: Internal Medicine

## 2016-12-15 VITALS — BP 136/80 | HR 87 | Temp 98.3°F | Resp 20 | Wt 150.0 lb

## 2016-12-15 DIAGNOSIS — M5136 Other intervertebral disc degeneration, lumbar region: Secondary | ICD-10-CM | POA: Insufficient documentation

## 2016-12-15 DIAGNOSIS — J45902 Unspecified asthma with status asthmaticus: Secondary | ICD-10-CM

## 2016-12-15 DIAGNOSIS — Z1159 Encounter for screening for other viral diseases: Secondary | ICD-10-CM | POA: Diagnosis not present

## 2016-12-15 DIAGNOSIS — R7302 Impaired glucose tolerance (oral): Secondary | ICD-10-CM

## 2016-12-15 DIAGNOSIS — M51369 Other intervertebral disc degeneration, lumbar region without mention of lumbar back pain or lower extremity pain: Secondary | ICD-10-CM | POA: Insufficient documentation

## 2016-12-15 DIAGNOSIS — Z0000 Encounter for general adult medical examination without abnormal findings: Secondary | ICD-10-CM

## 2016-12-15 DIAGNOSIS — J069 Acute upper respiratory infection, unspecified: Secondary | ICD-10-CM

## 2016-12-15 DIAGNOSIS — G894 Chronic pain syndrome: Secondary | ICD-10-CM | POA: Diagnosis not present

## 2016-12-15 DIAGNOSIS — E039 Hypothyroidism, unspecified: Secondary | ICD-10-CM

## 2016-12-15 DIAGNOSIS — E876 Hypokalemia: Secondary | ICD-10-CM

## 2016-12-15 HISTORY — DX: Other intervertebral disc degeneration, lumbar region: M51.36

## 2016-12-15 HISTORY — DX: Other intervertebral disc degeneration, lumbar region without mention of lumbar back pain or lower extremity pain: M51.369

## 2016-12-15 HISTORY — DX: Chronic pain syndrome: G89.4

## 2016-12-15 LAB — BASIC METABOLIC PANEL
BUN: 17 mg/dL (ref 6–23)
CHLORIDE: 101 meq/L (ref 96–112)
CO2: 29 meq/L (ref 19–32)
Calcium: 9.5 mg/dL (ref 8.4–10.5)
Creatinine, Ser: 0.83 mg/dL (ref 0.40–1.20)
GFR: 88.83 mL/min (ref >60.00)
GLUCOSE: 131 mg/dL — AB (ref 70–99)
POTASSIUM: 3.2 meq/L — AB (ref 3.5–5.1)
SODIUM: 138 meq/L (ref 135–145)

## 2016-12-15 LAB — CBC WITH DIFFERENTIAL/PLATELET
Basophils Absolute: 0 10*3/uL (ref 0.0–0.1)
Basophils Relative: 0.1 % (ref 0.0–3.0)
EOS ABS: 0.1 10*3/uL (ref 0.0–0.7)
Eosinophils Relative: 1.3 % (ref 0.0–5.0)
HCT: 39.4 % (ref 36.0–46.0)
HEMOGLOBIN: 13.5 g/dL (ref 12.0–15.0)
LYMPHS ABS: 2.2 10*3/uL (ref 0.7–4.0)
Lymphocytes Relative: 31.3 % (ref 12.0–46.0)
MCHC: 34.2 g/dL (ref 30.0–36.0)
MCV: 91.3 fl (ref 78.0–100.0)
Monocytes Absolute: 0.5 10*3/uL (ref 0.1–1.0)
Monocytes Relative: 7.4 % (ref 3.0–12.0)
NEUTROS PCT: 59.9 % (ref 43.0–77.0)
Neutro Abs: 4.2 10*3/uL (ref 1.4–7.7)
Platelets: 271 10*3/uL (ref 150.0–400.0)
RBC: 4.31 Mil/uL (ref 3.87–5.11)
RDW: 14.3 % (ref 11.5–15.5)
WBC: 7 10*3/uL (ref 4.0–10.5)

## 2016-12-15 LAB — LIPID PANEL
CHOLESTEROL: 207 mg/dL — AB (ref 0–200)
HDL: 74.2 mg/dL (ref >39.00)
LDL Cholesterol: 118 mg/dL — ABNORMAL HIGH (ref 0–99)
NonHDL: 132.45
TRIGLYCERIDES: 72 mg/dL (ref 0.0–149.0)
Total CHOL/HDL Ratio: 3
VLDL: 14.4 mg/dL (ref 0.0–40.0)

## 2016-12-15 LAB — URINALYSIS, ROUTINE W REFLEX MICROSCOPIC
Bilirubin Urine: NEGATIVE
Hgb urine dipstick: NEGATIVE
Ketones, ur: NEGATIVE
LEUKOCYTES UA: NEGATIVE
NITRITE: NEGATIVE
PH: 6 (ref 5.0–8.0)
RBC / HPF: NONE SEEN (ref 0–?)
Specific Gravity, Urine: 1.01 (ref 1.000–1.030)
TOTAL PROTEIN, URINE-UPE24: NEGATIVE
URINE GLUCOSE: NEGATIVE
Urobilinogen, UA: 0.2 (ref 0.0–1.0)
WBC, UA: NONE SEEN (ref 0–?)

## 2016-12-15 LAB — HEMOGLOBIN A1C: HEMOGLOBIN A1C: 6.2 % (ref 4.6–6.5)

## 2016-12-15 LAB — HEPATIC FUNCTION PANEL
ALBUMIN: 4.7 g/dL (ref 3.5–5.2)
ALK PHOS: 96 U/L (ref 39–117)
ALT: 10 U/L (ref 0–35)
AST: 13 U/L (ref 0–37)
Bilirubin, Direct: 0.1 mg/dL (ref 0.0–0.3)
Total Bilirubin: 0.5 mg/dL (ref 0.2–1.2)
Total Protein: 7.8 g/dL (ref 6.0–8.3)

## 2016-12-15 LAB — HEPATITIS C ANTIBODY: HCV Ab: NEGATIVE

## 2016-12-15 LAB — TSH: TSH: 7.3 u[IU]/mL — AB (ref 0.35–4.50)

## 2016-12-15 MED ORDER — ATORVASTATIN CALCIUM 20 MG PO TABS: ORAL_TABLET | ORAL | 3 refills | Status: DC

## 2016-12-15 MED ORDER — CLOPIDOGREL BISULFATE 75 MG PO TABS: 75.0000 mg | ORAL_TABLET | Freq: Every day | ORAL | 3 refills | Status: DC

## 2016-12-15 MED ORDER — LEVALBUTEROL TARTRATE 45 MCG/ACT IN AERO
2.0000 | INHALATION_SPRAY | Freq: Four times a day (QID) | RESPIRATORY_TRACT | 11 refills | Status: DC | PRN
Start: 2016-12-15 — End: 2017-10-19

## 2016-12-15 MED ORDER — MONTELUKAST SODIUM 10 MG PO TABS: 10.0000 mg | ORAL_TABLET | Freq: Every day | ORAL | 3 refills | Status: DC

## 2016-12-15 MED ORDER — ZOLPIDEM TARTRATE 10 MG PO TABS: 10.0000 mg | ORAL_TABLET | Freq: Every day | ORAL | 5 refills | Status: DC | PRN

## 2016-12-15 MED ORDER — MOMETASONE FURO-FORMOTEROL FUM 200-5 MCG/ACT IN AERO: 2.0000 | INHALATION_SPRAY | Freq: Two times a day (BID) | RESPIRATORY_TRACT | 11 refills | Status: DC

## 2016-12-15 MED ORDER — AMLODIPINE-OLMESARTAN 10-40 MG PO TABS: 1.0000 | ORAL_TABLET | Freq: Every day | ORAL | 3 refills | Status: DC

## 2016-12-15 MED ORDER — LEVOTHYROXINE SODIUM 25 MCG PO TABS: 25.0000 ug | ORAL_TABLET | Freq: Every day | ORAL | 3 refills | Status: DC

## 2016-12-15 MED ORDER — POTASSIUM CHLORIDE ER 10 MEQ PO TBCR: 10.0000 meq | EXTENDED_RELEASE_TABLET | Freq: Every day | ORAL | 3 refills | Status: DC

## 2016-12-15 MED ORDER — HYDROCHLOROTHIAZIDE 12.5 MG PO CAPS: 12.5000 mg | ORAL_CAPSULE | Freq: Every day | ORAL | 3 refills | Status: DC

## 2016-12-15 MED ORDER — AZITHROMYCIN 250 MG PO TABS: ORAL_TABLET | ORAL | 1 refills | Status: DC

## 2016-12-15 MED ORDER — TRAMADOL HCL 50 MG PO TABS: 50.0000 mg | ORAL_TABLET | Freq: Three times a day (TID) | ORAL | 5 refills | Status: DC | PRN

## 2016-12-15 NOTE — Progress Notes (Signed)
Pre visit review using our clinic review tool, if applicable. No additional management support is needed unless otherwise documented below in the visit note. 

## 2016-12-15 NOTE — Assessment & Plan Note (Signed)
With chronic LBP not requiring surgury and ESI copay too expensive, declines PT, for tramadol/flexeril prn,  to f/u any worsening symptoms or concerns

## 2016-12-15 NOTE — Assessment & Plan Note (Signed)
Mild to mod, for antibx course,  to f/u any worsening symptoms or concerns 

## 2016-12-15 NOTE — Assessment & Plan Note (Signed)

## 2016-12-15 NOTE — Patient Instructions (Signed)
Please take all new medication as prescribed - the antibiotic, as well as the pain medication (tramadol) and muscle relaxer if needed  OK to stop the nabumetome  Please continue all other medications as before, and refills have been done if requested.  Please have the pharmacy call with any other refills you may need.  Please continue your efforts at being more active, low cholesterol diet, and weight control.  You are otherwise up to date with prevention measures today.  Please keep your appointments with your specialists as you may have planned  Please go to the LAB in the Basement (turn left off the elevator) for the tests to be done today  You will be contacted by phone if any changes need to be made immediately.  Otherwise, you will receive a letter about your results with an explanation, but please check with MyChart first.  Please remember to sign up for MyChart if you have not done so, as this will be important to you in the future with finding out test results, communicating by private email, and scheduling acute appointments online when needed.  Please return in 6 months, or sooner if needed

## 2016-12-15 NOTE — Assessment & Plan Note (Signed)
Ok to try change to dulera from the advair due to cost, hopefully to be less expensive

## 2016-12-15 NOTE — Progress Notes (Signed)
Subjective:    Patient ID: [name] [name], female    DOB: 26-Apr-1951, 66 y.o.   MRN: [id number]  HPI  Here for wellness and f/u;  Overall doing ok;  Pt denies Chest pain, worsening orthopnea, PND, worsening LE edema, palpitations, dizziness or syncope.  Pt denies neurological change such as new headache, facial or extremity weakness.  Pt denies polydipsia, polyuria, or low sugar symptoms. Pt states overall good compliance with treatment and medications, good tolerability, and has been trying to follow appropriate diet.  Pt denies worsening depressive symptoms, suicidal ideation or panic. No fever, night sweats, wt loss, loss of appetite, or other constitutional symptoms.  Pt states good ability with ADL's, has low fall risk, home safety reviewed and adequate, no other significant changes in hearing or vision, and no longer active with exercise, when she used to walk 6 miles per day.  Still working but Pt continues to have recurring rright LBP, moderate, bowel or bladder change, fever, wt loss,  worsening LE pain/numbness/weakness, gait change or falls. Nabumetome is not helping  Incidentally -  Here with 2 wks acute onset fever, facial pain, pressure, headache, general weakness and malaise, and greenish d/c, with mild ST and cough  Advair too expensive  - cannot take but still having occas wheeze and tightness, worse at night.   Also still signficant issue with sleep - still need ambien bc cannot get to sleep most nights.  No other new history Past Medical History:  Diagnosis Date  . ALCOHOL ABUSE, HX OF 11/06/2007  . ALLERGIC RHINITIS 11/06/2007  . Anxiety 04/03/2011  . ASTHMA 09/13/2007  . Chronic pain syndrome 12/15/2016  . COLONIC POLYPS, HX OF 02/09/2008    ADENOMATOUS POLYP  . HYPERLIPIDEMIA 11/03/2010  . HYPERTENSION 09/13/2007  . HYPOTHYROIDISM 11/06/2007  . Impaired glucose tolerance 07/30/2013  . INSOMNIA, HX OF 09/13/2007  . Lumbar degenerative disc disease 12/15/2016  . OSTEOPOROSIS  11/06/2007  . PERIMENOPAUSAL STATUS 09/13/2007  . Stroke ([place])   . TIA (transient ischemic attack) 11/04/2011   Past Surgical History:  Procedure Laterality Date  . BREAST SURGERY  2008 and 2012   x 2 - benign, left side  . carotid artery surgery Left   . CESAREAN SECTION     x 3  . COLONOSCOPY  multiple   2010    reports that she has never smoked. She has never used smokeless tobacco. She reports that she does not drink alcohol or use drugs. family history includes Coronary artery disease in her other; Hyperlipidemia in her other; Hypertension in her other. Allergies  Allergen Reactions  . Belsomra [Suvorexant] Other (See Comments)    Visual psychedelic lights  . Fosamax [Alendronate Sodium] Nausea And Vomiting  . Penicillins Hives   Current Outpatient Prescriptions on File Prior to Visit  Medication Sig Dispense Refill  . ibuprofen (ADVIL,MOTRIN) 800 MG tablet TAKE 1 TABLET(800 MG) BY MOUTH EVERY 8 HOURS AS NEEDED 90 tablet 0  . acetaminophen (TYLENOL) 325 MG tablet Take 650 mg by mouth every 6 (six) hours as needed.      . fexofenadine (ALLEGRA) 180 MG tablet Take 1 tablet (180 mg total) by mouth daily. 90 tablet 3   No current facility-administered medications on file prior to visit.    Review of Systems Constitutional: Negative for increased diaphoresis, or other activity, appetite or siginficant weight change other than noted HENT: Negative for worsening hearing loss, ear pain, facial swelling, mouth sores and neck stiffness.   Eyes: Negative  for other worsening pain, redness or visual disturbance.  Respiratory: Negative for choking or stridor Cardiovascular: Negative for other chest pain and palpitations.  Gastrointestinal: Negative for worsening diarrhea, blood in stool, or abdominal distention Genitourinary: Negative for hematuria, flank pain or change in urine volume.  Musculoskeletal: Negative for myalgias or other joint complaints.  Skin: Negative for other color  change and wound or drainage.  Neurological: Negative for syncope and numbness. other than noted Hematological: Negative for adenopathy. or other swelling Psychiatric/Behavioral: Negative for hallucinations, SI, self-injury, decreased concentration or other worsening agitation.  All other system neg per pt      Objective:   Physical Exam BP 136/80   Pulse 87   Temp 98.3 F (36.8 C) (Oral)   Resp 20   Wt 150 lb (68 kg)   SpO2 93%   BMI 30.30 kg/m  VS noted, mild ill Constitutional: Pt is oriented to person, place, and time. Appears well-developed and well-nourished, in no significant distress Head: Normocephalic and atraumatic  Eyes: Conjunctivae and EOM are normal. Pupils are equal, round, and reactive to light Right Ear: External ear normal.  Left Ear: External ear normal Nose: Nose normal.  Bilat tm's with mild erythema.  Max sinus areas non tender.  Pharynx with mild erythema, no exudate Mouth/Throat: Oropharynx is clear and moist  Neck: Normal range of motion. Neck supple. No JVD present. No tracheal deviation present or significant neck LA or mass Cardiovascular: Normal rate, regular rhythm, normal heart sounds and intact distal pulses.   Pulmonary/Chest: Effort normal and breath sounds without rales or wheezing  Abdominal: Soft. Bowel sounds are normal. NT. No HSM  Musculoskeletal: Normal range of motion. Exhibits no edema, has significant tender without swelling lowest lumbar on right paravertebral Lymphadenopathy: Has no cervical adenopathy.  Neurological: Pt is alert and oriented to person, place, and time. Pt has normal reflexes. No cranial nerve deficit. Motor 5/5intact, cn 2-12intact Skin: Skin is warm and dry. No rash noted or new ulcers Psychiatric:  Has normal mood and affect. Behavior is normal.     Assessment & Plan:

## 2016-12-15 NOTE — Assessment & Plan Note (Signed)
stable overall by history and exam, recent data reviewed with pt, and pt to continue medical treatment as before,  to f/u any worsening symptoms or concerns Lab Results  Component Value Date   HGBA1C 6.2 07/03/2015   For f/u a1c

## 2016-12-16 ENCOUNTER — Telehealth: Payer: Self-pay

## 2016-12-16 NOTE — Telephone Encounter (Signed)
Started PA for patients ambien. Key is [id number]. Will notify patient once we hear decision.

## 2017-01-24 ENCOUNTER — Other Ambulatory Visit: Payer: Self-pay | Admitting: Internal Medicine

## 2017-02-10 ENCOUNTER — Other Ambulatory Visit (INDEPENDENT_AMBULATORY_CARE_PROVIDER_SITE_OTHER): Payer: BC Managed Care – PPO

## 2017-02-10 DIAGNOSIS — I519 Heart disease, unspecified: Principal | ICD-10-CM

## 2017-02-10 DIAGNOSIS — E876 Hypokalemia: Secondary | ICD-10-CM | POA: Diagnosis not present

## 2017-02-10 DIAGNOSIS — I43 Cardiomyopathy in diseases classified elsewhere: Secondary | ICD-10-CM

## 2017-02-10 DIAGNOSIS — E039 Hypothyroidism, unspecified: Secondary | ICD-10-CM

## 2017-02-10 LAB — BASIC METABOLIC PANEL
BUN: 18 mg/dL (ref 6–23)
CALCIUM: 9.1 mg/dL (ref 8.4–10.5)
CO2: 30 mEq/L (ref 19–32)
Chloride: 99 mEq/L (ref 96–112)
Creatinine, Ser: 0.96 mg/dL (ref 0.40–1.20)
GFR: 75.06 mL/min (ref >60.00)
GLUCOSE: 96 mg/dL (ref 70–99)
Potassium: 3.4 mEq/L — ABNORMAL LOW (ref 3.5–5.1)
SODIUM: 136 meq/L (ref 135–145)

## 2017-02-10 LAB — TSH: TSH: 6.6 u[IU]/mL — ABNORMAL HIGH (ref 0.35–4.50)

## 2017-02-10 LAB — T4, FREE: Free T4: 0.92 ng/dL (ref 0.60–1.60)

## 2017-02-21 ENCOUNTER — Other Ambulatory Visit: Payer: Self-pay | Admitting: Internal Medicine

## 2017-02-23 ENCOUNTER — Other Ambulatory Visit: Payer: Self-pay | Admitting: *Deleted

## 2017-02-23 MED ORDER — ATORVASTATIN CALCIUM 20 MG PO TABS: ORAL_TABLET | ORAL | 11 refills | Status: DC

## 2017-05-06 ENCOUNTER — Ambulatory Visit (INDEPENDENT_AMBULATORY_CARE_PROVIDER_SITE_OTHER): Payer: BC Managed Care – PPO | Admitting: Internal Medicine

## 2017-05-06 ENCOUNTER — Encounter: Payer: Self-pay | Admitting: Internal Medicine

## 2017-05-06 VITALS — BP 138/78 | HR 81 | Ht 59.0 in | Wt 143.0 lb

## 2017-05-06 DIAGNOSIS — E039 Hypothyroidism, unspecified: Secondary | ICD-10-CM

## 2017-05-06 DIAGNOSIS — D179 Benign lipomatous neoplasm, unspecified: Secondary | ICD-10-CM | POA: Diagnosis not present

## 2017-05-06 DIAGNOSIS — M25512 Pain in left shoulder: Secondary | ICD-10-CM

## 2017-05-06 DIAGNOSIS — M79645 Pain in left finger(s): Secondary | ICD-10-CM | POA: Diagnosis not present

## 2017-05-06 MED ORDER — DICLOFENAC SODIUM 1 % TD GEL: 4.0000 g | Freq: Four times a day (QID) | TRANSDERMAL | 5 refills | Status: DC | PRN

## 2017-05-06 MED ORDER — ZOLPIDEM TARTRATE 10 MG PO TABS: 10.0000 mg | ORAL_TABLET | Freq: Every day | ORAL | 5 refills | Status: DC | PRN

## 2017-05-06 NOTE — Assessment & Plan Note (Signed)
Likely djd related vs rot cuff dz, for sport med referral,  to f/u any worsening symptoms or concerns\

## 2017-05-06 NOTE — Assessment & Plan Note (Signed)
Lab Results  Component Value Date   TSH 6.60 (H) 02/10/2017  urged compliance with med daily

## 2017-05-06 NOTE — Assessment & Plan Note (Signed)
Mild, for volt gel prn,  to f/u any worsening symptoms or concerns ?

## 2017-05-06 NOTE — Progress Notes (Signed)
Subjective:    Patient ID: [name] [name], female    DOB: Jun 12, 1951, 66 y.o.   MRN: [id number]  HPI  Here with recent onset right lateral side lipoma that been new in the last few months, but not sure if increasing in size recently.  Has seen a surgeon, but on return on home a friend stated she didn't trust the diagnosis without a biopsy.  Also c/o left thumb MCP pain and tenderness, mild, intermittent without swelling but worse to move the thumb for several months.  Also with left shoulder pain with crackling ongoing and recurring mild to mod pain, reduced ROM but no swelling, neck pain or LUE radicular pain for several months. Denies hyper or hypo thyroid symptoms such as voice, skin or hair change, but admits to not taking her thyroid medication every day, tends to forget as she was afraid to take it outside the rule of fasting every morning for 1 hour prior.  Does not have the time to delay eating.  \ Past Medical History:  Diagnosis Date  . ALCOHOL ABUSE, HX OF 11/06/2007  . ALLERGIC RHINITIS 11/06/2007  . Anxiety 04/03/2011  . ASTHMA 09/13/2007  . Chronic pain syndrome 12/15/2016  . COLONIC POLYPS, HX OF 02/09/2008    ADENOMATOUS POLYP  . HYPERLIPIDEMIA 11/03/2010  . HYPERTENSION 09/13/2007  . HYPOTHYROIDISM 11/06/2007  . Impaired glucose tolerance 07/30/2013  . INSOMNIA, HX OF 09/13/2007  . Lumbar degenerative disc disease 12/15/2016  . OSTEOPOROSIS 11/06/2007  . PERIMENOPAUSAL STATUS 09/13/2007  . Stroke ([place])   . TIA (transient ischemic attack) 11/04/2011   Past Surgical History:  Procedure Laterality Date  . BREAST SURGERY  2008 and 2012   x 2 - benign, left side  . carotid artery surgery Left   . CESAREAN SECTION     x 3  . COLONOSCOPY  multiple   2010    reports that she has never smoked. She has never used smokeless tobacco. She reports that she does not drink alcohol or use drugs. family history includes Coronary artery disease in her other; Hyperlipidemia in her other;  Hypertension in her other. Allergies  Allergen Reactions  . Belsomra [Suvorexant] Other (See Comments)    Visual psychedelic lights  . Fosamax [Alendronate Sodium] Nausea And Vomiting  . Penicillins Hives   Current Outpatient Prescriptions on File Prior to Visit  Medication Sig Dispense Refill  . acetaminophen (TYLENOL) 325 MG tablet Take 650 mg by mouth every 6 (six) hours as needed.      Marland Kitchen amLODipine-olmesartan (AZOR) 10-40 MG tablet Take 1 tablet by mouth daily. 90 tablet 3  . atorvastatin (LIPITOR) 20 MG tablet TAKE 1 TABLET(20 MG) BY MOUTH DAILY AT 6 PM 30 tablet 11  . clopidogrel (PLAVIX) 75 MG tablet TAKE 1 TABLET BY MOUTH DAILY 90 tablet 0  . hydrochlorothiazide (MICROZIDE) 12.5 MG capsule Take 1 capsule (12.5 mg total) by mouth daily. 90 capsule 3  . ibuprofen (ADVIL,MOTRIN) 800 MG tablet TAKE 1 TABLET(800 MG) BY MOUTH EVERY 8 HOURS AS NEEDED 90 tablet 0  . levalbuterol (XOPENEX HFA) 45 MCG/ACT inhaler Inhale 2 puffs into the lungs every 6 (six) hours as needed for wheezing. 1 Inhaler 11  . levothyroxine (SYNTHROID, LEVOTHROID) 25 MCG tablet Take 1 tablet (25 mcg total) by mouth daily before breakfast. 90 tablet 3  . mometasone-formoterol (DULERA) 200-5 MCG/ACT AERO Inhale 2 puffs into the lungs 2 (two) times daily. 1 Inhaler 11  . montelukast (SINGULAIR) 10 MG tablet Take 1  tablet (10 mg total) by mouth daily. 90 tablet 3  . potassium chloride (K-DUR) 10 MEQ tablet Take 1 tablet (10 mEq total) by mouth daily. 90 tablet 3  . traMADol (ULTRAM) 50 MG tablet Take 1 tablet (50 mg total) by mouth every 8 (eight) hours as needed. 90 tablet 5  . fexofenadine (ALLEGRA) 180 MG tablet Take 1 tablet (180 mg total) by mouth daily. 90 tablet 3   No current facility-administered medications on file prior to visit.    Review of Systems  Constitutional: Negative for other unusual diaphoresis or sweats HENT: Negative for ear discharge or swelling Eyes: Negative for other worsening visual  disturbances Respiratory: Negative for stridor or other swelling  Gastrointestinal: Negative for worsening distension or other blood Genitourinary: Negative for retention or other urinary change Musculoskeletal: Negative for other MSK pain or swelling Skin: Negative for color change or other new lesions Neurological: Negative for worsening tremors and other numbness  Psychiatric/Behavioral: Negative for worsening agitation or other fatigue All other system neg per pty    Objective:   Physical Exam BP 138/78   Pulse 81   Ht 4\' 11"  (1.499 m)   Wt 143 lb (64.9 kg)   SpO2 98%   BMI 28.88 kg/m  VS noted, not ill appearing Constitutional: Pt appears in NAD HENT: Head: NCAT.  Right Ear: External ear normal.  Left Ear: External ear normal.  Eyes: . Pupils are equal, round, and reactive to light. Conjunctivae and EOM are normal Nose: without d/c or deformity Neck: Neck supple. Gross normal ROM Cardiovascular: Normal rate and regular rhythm.   Pulmonary/Chest: Effort normal and breath sounds without rales or wheezing.  Left shoulder with crepitus, NT, no swelling, and reduced ROM Left thumb with mild bony degenerative change to MCP Abd:  Soft, NT, ND, + BS, no organomegaly, with 2-3 cm raised nontender lipoma to right side about t10 at the anterior axillary line Neurological: Pt is alert. At baseline orientation, motor grossly intact Skin: Skin is warm. No rashes, other new lesions, no LE edema Psychiatric: Pt behavior is normal without agitation  No other exam findings    Assessment & Plan:

## 2017-05-06 NOTE — Patient Instructions (Signed)
Please take all new medication as prescribed - the gel for the thumb pain  Please take your thyroid medication every day  Please continue all other medications as before, and refills have been done if requested - the ambien  Please have the pharmacy call with any other refills you may need.  Please keep your appointments with your specialists as you may have planned  You will be contacted regarding the referral for: Dr Tamala Julian for the left shoulder (or you can make an appt as you leave at the desk)  Please call for referral back to General Surgury if you feel the Lipoma keeps getting larger

## 2017-05-06 NOTE — Assessment & Plan Note (Signed)
D/w pt further the nature and natural history, and consider gen surgury re evaluation if increased in size

## 2017-05-07 ENCOUNTER — Telehealth: Payer: Self-pay

## 2017-05-07 NOTE — Telephone Encounter (Signed)
Approved  05/07/17-05/07/20 Key [id number]

## 2017-05-17 ENCOUNTER — Other Ambulatory Visit: Payer: Self-pay | Admitting: Internal Medicine

## 2017-06-02 ENCOUNTER — Ambulatory Visit: Payer: Self-pay

## 2017-06-02 ENCOUNTER — Ambulatory Visit (INDEPENDENT_AMBULATORY_CARE_PROVIDER_SITE_OTHER): Payer: BC Managed Care – PPO | Admitting: Family Medicine

## 2017-06-02 ENCOUNTER — Encounter: Payer: Self-pay | Admitting: Family Medicine

## 2017-06-02 VITALS — BP 108/68 | HR 84 | Ht 59.0 in | Wt 144.0 lb

## 2017-06-02 DIAGNOSIS — M65312 Trigger thumb, left thumb: Secondary | ICD-10-CM | POA: Insufficient documentation

## 2017-06-02 DIAGNOSIS — M25512 Pain in left shoulder: Secondary | ICD-10-CM

## 2017-06-02 DIAGNOSIS — M12812 Other specific arthropathies, not elsewhere classified, left shoulder: Secondary | ICD-10-CM | POA: Insufficient documentation

## 2017-06-02 NOTE — Assessment & Plan Note (Signed)
Patient is a trigger thumb of the left hand. Discussed with patient at great length. We discussed icing regimen and home exercises. We discussed which activities to do in which ones to avoid. Patient will increase activity as tolerated. Patient has worsening symptoms consider injection.

## 2017-06-02 NOTE — Patient Instructions (Addendum)
Good to see you.  Ice 20 minutes 2 times daily. Usually after activity and before bed. Exercises 3 times a week.  pennsaid pinkie amount topically 2 times daily as needed.  Over the counter Vitamin D 2000 IU daily  Turmeric 500mg  daily  Tart cherry extract any dose at night For the thumb maybe splint it at night with a popsicle stick. See me again in 4-6 weeks and if worse we will consider injection.

## 2017-06-02 NOTE — Assessment & Plan Note (Signed)
Patient does have some mild rotator cuff arthropathy left shoulder. Wants to try conservative therapy. Home exercise, icing regimen, which activities to do which ones to avoid. Work with Product/process development scientist. Topical anti-inflammatories given and we discussed over-the-counter medicines. Follow-up again in 4-6 weeks

## 2017-06-02 NOTE — Progress Notes (Signed)
[name] [address] [place], [place] [address] Phone: [phone] Subjective:    I'm seeing this patient by the request  of:  Biagio Borg, MD   CC: Left shoulder and left thumb  [id number]  Noell B Masaki is a 66 y.o. female coming in with complaint of Left shoulder pain. Describes it as a dull, throbbing aching pain. Noticing some mild decrease in range of motion. Does not remember any true injury. Been going on months. Patient states that he does make a popping sound with certain range of motion. Can hurt her when laying on it at night. Rates severity is 6 out of 10 no radiation down the arm better with rest  Patient is also complaining of left thumb pain. States that sometimes gets stuck in a flexed position. Mild aching rates severity 3 out of 10. No numbness, or swelling  Past Medical History:  Diagnosis Date  . ALCOHOL ABUSE, HX OF 11/06/2007  . ALLERGIC RHINITIS 11/06/2007  . Anxiety 04/03/2011  . ASTHMA 09/13/2007  . Chronic pain syndrome 12/15/2016  . COLONIC POLYPS, HX OF 02/09/2008    ADENOMATOUS POLYP  . HYPERLIPIDEMIA 11/03/2010  . HYPERTENSION 09/13/2007  . HYPOTHYROIDISM 11/06/2007  . Impaired glucose tolerance 07/30/2013  . INSOMNIA, HX OF 09/13/2007  . Lumbar degenerative disc disease 12/15/2016  . OSTEOPOROSIS 11/06/2007  . PERIMENOPAUSAL STATUS 09/13/2007  . Stroke ([place])   . TIA (transient ischemic attack) 11/04/2011   Past Surgical History:  Procedure Laterality Date  . BREAST SURGERY  2008 and 2012   x 2 - benign, left side  . carotid artery surgery Left   . CESAREAN SECTION     x 3  . COLONOSCOPY  multiple   2010   Social History   Social History  . Marital status: Divorced    Spouse name: N/A  . Number of children: 3  . Years of education: N/A   Occupational History  . [employer]   Social History Main Topics  . Smoking status: Never Smoker  . Smokeless tobacco: Never Used    . Alcohol use No  . Drug use: No  . Sexual activity: Not Asked   Other Topics Concern  . None   Social History Narrative  . None   Allergies  Allergen Reactions  . Belsomra [Suvorexant] Other (See Comments)    Visual psychedelic lights  . Fosamax [Alendronate Sodium] Nausea And Vomiting  . Penicillins Hives   Family History  Problem Relation Age of Onset  . Hyperlipidemia Other   . Hypertension Other   . Coronary artery disease Other   . Colon cancer Neg Hx     Past medical history, social, surgical and family history all reviewed in electronic medical record.  No pertanent information unless stated regarding to the chief complaint.   Review of Systems:Review of systems updated and as accurate as of 06/02/17  No headache, visual changes, nausea, vomiting, diarrhea, constipation, dizziness, abdominal pain, skin rash, fevers, chills, night sweats, weight loss, swollen lymph nodes, body aches, joint swelling, chest pain, shortness of breath, mood changes. Positive muscle aches  Objective  Blood pressure 108/68, pulse 84, height 4\' 11"  (1.499 m), weight 144 lb (65.3 kg). Systems examined below as of 06/02/17   General: No apparent distress alert and oriented x3 mood and affect normal, dressed appropriately.  HEENT: Pupils equal, extraocular movements intact  Respiratory: Patient's speak in full sentences and does not appear short of  breath  Cardiovascular: No lower extremity edema, non tender, no erythema  Skin: Warm dry intact with no signs of infection or rash on extremities or on axial skeleton.  Abdomen: Soft nontender  Neuro: Cranial nerves II through XII are intact, neurovascularly intact in all extremities with 2+ DTRs and 2+ pulses.  Lymph: No lymphadenopathy of posterior or anterior cervical chain or axillae bilaterally.  Gait normal with good balance and coordination.  MSK:  Non tender with full range of motion and good stability and symmetric strength and tone of  shoulders, elbows, wrist, hip, knee and ankles bilaterally.   Hand exam shows the patient does have a nodule at the a 1 pulley on the thumb left hand. Otherwise unremarkable  Shoulder: Left Inspection reveals no abnormalities, atrophy or asymmetry. Palpation is normal with no tenderness over AC joint or bicipital groove. Limited external range of motion as well as only internal rotation to lateral hip. Patient also has crepitus with range of motion Rotator cuff strength normal throughout. Moderate impingement Speeds and Yergason's tests normal. No labral pathology noted with negative Obrien's, negative clunk and good stability. Normal scapular function observed. No painful arc and no drop arm sign. No apprehension sign Contralateral shoulder unremarkable  MSK US performed of: Left This study was ordered, performed, and interpreted by Charlann Boxer D.O.  Shoulder:   Supraspinatus:  Appears mild degenerative no bursal bulge seen with shoulder abduction on impingement view. Moderate underlying arthritic changes Subscapularis:  Mild degenerative changes Teres Minor:  Appears normal on long and transverse views. AC joint:  Mild to moderate arthritic changes Glenohumeral Joint:  Moderate narrowing Glenoid Labrum:  Intact without visualized tears. Biceps Tendon:  Appears normal on long and transverse views, no fraying of tendon, tendon located in intertubercular groove, no subluxation with shoulder internal or external rotation. No increased power doppler signal. Impression: Rotator cuff arthropathy  Procedure note 97110; 15 minutes spent for Therapeutic exercises as stated in above notes.  This included exercises focusing on stretching, strengthening, with significant focus on eccentric aspects.  Shoulder Exercises that included:  Basic scapular stabilization to include adduction and depression of scapula Scaption, focusing on proper movement and good control Internal and External rotation  utilizing a theraband, with elbow tucked at side entire time Rows with theraband   Proper technique shown and discussed handout in great detail with ATC.  All questions were discussed and answered.    Impression and Recommendations:     This case required medical decision making of moderate complexity.      Note: This dictation was prepared with Dragon dictation along with smaller phrase technology. Any transcriptional errors that result from this process are unintentional.

## 2017-06-17 ENCOUNTER — Other Ambulatory Visit: Payer: Self-pay | Admitting: Internal Medicine

## 2017-07-08 ENCOUNTER — Other Ambulatory Visit: Payer: Self-pay | Admitting: Internal Medicine

## 2017-07-08 DIAGNOSIS — Z1231 Encounter for screening mammogram for malignant neoplasm of breast: Secondary | ICD-10-CM

## 2017-07-08 NOTE — Telephone Encounter (Signed)
[name] [name] too soon as 6 mo total refills done in late may 2018

## 2017-07-14 ENCOUNTER — Ambulatory Visit (INDEPENDENT_AMBULATORY_CARE_PROVIDER_SITE_OTHER): Payer: Medicare Other | Admitting: Family Medicine

## 2017-07-14 ENCOUNTER — Encounter: Payer: Self-pay | Admitting: Family Medicine

## 2017-07-14 DIAGNOSIS — M12812 Other specific arthropathies, not elsewhere classified, left shoulder: Secondary | ICD-10-CM

## 2017-07-14 NOTE — Assessment & Plan Note (Signed)
Doing well overall. Discussed icing regimen and home exercises. Discussed which activities doing which ones to avoid. We discussed avoiding certain activities. Patient will do more of an icing regimen. Topical anti-inflammatories encouraged. Patient follow-up as needed

## 2017-07-14 NOTE — Progress Notes (Signed)
[name] [address] [place], [place] [address] Phone: [phone] Subjective:    I'm seeing this patient by the request  of:  Biagio Borg, MD   CC: Left shoulder and left thumb  [id number]  [name] [name] is a 66 y.o. female coming in with complaint of Left shoulder pain.An found of rotator cuff arthropathy. Patient has been doing conservative therapy and continues to make significant improvement. Patient states that she is 99% better. Very mild discomfort but nothing severe. Doing very well with conservative therapy.   Past Medical History:  Diagnosis Date  . ALCOHOL ABUSE, HX OF 11/06/2007  . ALLERGIC RHINITIS 11/06/2007  . Anxiety 04/03/2011  . ASTHMA 09/13/2007  . Chronic pain syndrome 12/15/2016  . COLONIC POLYPS, HX OF 02/09/2008    ADENOMATOUS POLYP  . HYPERLIPIDEMIA 11/03/2010  . HYPERTENSION 09/13/2007  . HYPOTHYROIDISM 11/06/2007  . Impaired glucose tolerance 07/30/2013  . INSOMNIA, HX OF 09/13/2007  . Lumbar degenerative disc disease 12/15/2016  . OSTEOPOROSIS 11/06/2007  . PERIMENOPAUSAL STATUS 09/13/2007  . Stroke ([place])   . TIA (transient ischemic attack) 11/04/2011   Past Surgical History:  Procedure Laterality Date  . BREAST SURGERY  2008 and 2012   x 2 - benign, left side  . carotid artery surgery Left   . CESAREAN SECTION     x 3  . COLONOSCOPY  multiple   2010   Social History   Social History  . Marital status: Divorced    Spouse name: N/A  . Number of children: 3  . Years of education: N/A   Occupational History  . [employer]   Social History Main Topics  . Smoking status: Never Smoker  . Smokeless tobacco: Never Used  . Alcohol use No  . Drug use: No  . Sexual activity: Not Asked   Other Topics Concern  . None   Social History Narrative  . None   Allergies  Allergen Reactions  . Belsomra [Suvorexant] Other (See Comments)    Visual psychedelic lights  . Fosamax  [Alendronate Sodium] Nausea And Vomiting  . Penicillins Hives   Family History  Problem Relation Age of Onset  . Hyperlipidemia Other   . Hypertension Other   . Coronary artery disease Other   . Colon cancer Neg Hx     Past medical history, social, surgical and family history all reviewed in electronic medical record.  No pertanent information unless stated regarding to the chief complaint.   Review of Systems: No headache, visual changes, nausea, vomiting, diarrhea, constipation, dizziness, abdominal pain, skin rash, fevers, chills, night sweats, weight loss, swollen lymph nodes, body aches, joint swelling, muscle aches, chest pain, shortness of breath, mood changes.    Objective  Blood pressure 138/84, pulse 76, height 4\' 11"  (1.499 m), weight 144 lb (65.3 kg). Systems examined below as of 07/14/17   General: No apparent distress alert and oriented x3 mood and affect normal, dressed appropriately.  HEENT: Pupils equal, extraocular movements intact  Respiratory: Patient's speak in full sentences and does not appear short of breath  Cardiovascular: No lower extremity edema, non tender, no erythema  Skin: Warm dry intact with no signs of infection or rash on extremities or on axial skeleton.  Abdomen: Soft nontender  Neuro: Cranial nerves II through XII are intact, neurovascularly intact in all extremities with 2+ DTRs and 2+ pulses.  Lymph: No lymphadenopathy of posterior or anterior cervical chain or axillae bilaterally.  Gait normal with good balance and coordination.  MSK:  Non tender with full range of motion and good stability and symmetric strength and tone of , elbows, wrist, hip, knee and ankles bilaterally. Mild arthritis of multiple joints   Shoulder: Left Inspection mild atrophy of musculature Palpation is normal with no tenderness over AC joint or bicipital groove. Lacks last 5 of rotation externally and internally 4-5 strength compared to the contralateral side Mild  impingement Speeds and Yergason's tests normal. No labral pathology noted with negative Obrien's, negative clunk and good stability. Normal scapular function observed. No painful arc and no drop arm sign. No apprehension sign Contralateral shoulder unremarkable     Impression and Recommendations:     This case required medical decision making of moderate complexity.      Note: This dictation was prepared with Dragon dictation along with smaller phrase technology. Any transcriptional errors that result from this process are unintentional.

## 2017-07-16 ENCOUNTER — Other Ambulatory Visit: Payer: Self-pay | Admitting: Internal Medicine

## 2017-07-26 ENCOUNTER — Ambulatory Visit
Admission: RE | Admit: 2017-07-26 | Discharge: 2017-07-26 | Disposition: A | Discharge status: Home / Self Care | Payer: Medicare Other | Source: Ambulatory Visit | Attending: Internal Medicine | Admitting: Internal Medicine

## 2017-07-26 DIAGNOSIS — Z1231 Encounter for screening mammogram for malignant neoplasm of breast: Secondary | ICD-10-CM

## 2017-08-20 ENCOUNTER — Telehealth: Payer: Self-pay | Admitting: Internal Medicine

## 2017-08-20 NOTE — Telephone Encounter (Signed)
States insurance company sent her a letter stating she needs a PA processed on Ambien with [facility].  Can be contacted at [phone]  fx [phone]

## 2017-08-23 NOTE — Telephone Encounter (Signed)
Key: [id number] Submitted on 08/23/17

## 2017-08-24 NOTE — Telephone Encounter (Signed)
Approved through 12/13/17  Determination sent to scan

## 2017-09-10 ENCOUNTER — Other Ambulatory Visit: Payer: Self-pay | Admitting: Internal Medicine

## 2017-10-11 ENCOUNTER — Other Ambulatory Visit: Payer: Self-pay | Admitting: Internal Medicine

## 2017-10-13 ENCOUNTER — Other Ambulatory Visit: Payer: Self-pay | Admitting: Internal Medicine

## 2017-10-13 NOTE — Telephone Encounter (Signed)
Done hardcopy to [facility]  

## 2017-10-14 NOTE — Telephone Encounter (Signed)
Faxed

## 2017-10-19 ENCOUNTER — Other Ambulatory Visit: Payer: Self-pay | Admitting: Internal Medicine

## 2017-10-19 ENCOUNTER — Ambulatory Visit (INDEPENDENT_AMBULATORY_CARE_PROVIDER_SITE_OTHER): Payer: Medicare Other | Admitting: Internal Medicine

## 2017-10-19 ENCOUNTER — Encounter: Payer: Self-pay | Admitting: Internal Medicine

## 2017-10-19 ENCOUNTER — Other Ambulatory Visit (INDEPENDENT_AMBULATORY_CARE_PROVIDER_SITE_OTHER): Payer: Medicare Other

## 2017-10-19 ENCOUNTER — Ambulatory Visit (INDEPENDENT_AMBULATORY_CARE_PROVIDER_SITE_OTHER)
Admission: RE | Admit: 2017-10-19 | Discharge: 2017-10-19 | Disposition: A | Discharge status: Home / Self Care | Payer: Medicare Other | Source: Ambulatory Visit | Attending: Internal Medicine | Admitting: Internal Medicine

## 2017-10-19 VITALS — BP 112/70 | HR 78 | Temp 98.1°F | Ht 59.0 in | Wt 146.0 lb

## 2017-10-19 DIAGNOSIS — M79674 Pain in right toe(s): Secondary | ICD-10-CM

## 2017-10-19 DIAGNOSIS — E2839 Other primary ovarian failure: Secondary | ICD-10-CM | POA: Diagnosis not present

## 2017-10-19 DIAGNOSIS — E039 Hypothyroidism, unspecified: Secondary | ICD-10-CM

## 2017-10-19 DIAGNOSIS — R7302 Impaired glucose tolerance (oral): Secondary | ICD-10-CM | POA: Diagnosis not present

## 2017-10-19 DIAGNOSIS — I10 Essential (primary) hypertension: Secondary | ICD-10-CM | POA: Diagnosis not present

## 2017-10-19 DIAGNOSIS — E785 Hyperlipidemia, unspecified: Secondary | ICD-10-CM

## 2017-10-19 DIAGNOSIS — Z23 Encounter for immunization: Secondary | ICD-10-CM | POA: Diagnosis not present

## 2017-10-19 DIAGNOSIS — Z114 Encounter for screening for human immunodeficiency virus [HIV]: Secondary | ICD-10-CM

## 2017-10-19 LAB — URINALYSIS, ROUTINE W REFLEX MICROSCOPIC
BILIRUBIN URINE: NEGATIVE
HGB URINE DIPSTICK: NEGATIVE
Ketones, ur: NEGATIVE
LEUKOCYTES UA: NEGATIVE
NITRITE: NEGATIVE
PH: 6 (ref 5.0–8.0)
RBC / HPF: NONE SEEN (ref 0–?)
Specific Gravity, Urine: 1.02 (ref 1.000–1.030)
TOTAL PROTEIN, URINE-UPE24: NEGATIVE
URINE GLUCOSE: NEGATIVE
UROBILINOGEN UA: 0.2 (ref 0.0–1.0)

## 2017-10-19 LAB — BASIC METABOLIC PANEL
BUN: 13 mg/dL (ref 6–23)
CALCIUM: 10.1 mg/dL (ref 8.4–10.5)
CHLORIDE: 102 meq/L (ref 96–112)
CO2: 30 meq/L (ref 19–32)
CREATININE: 0.94 mg/dL (ref 0.40–1.20)
GFR: 76.74 mL/min (ref >60.00)
Glucose, Bld: 124 mg/dL — ABNORMAL HIGH (ref 70–99)
POTASSIUM: 3.4 meq/L — AB (ref 3.5–5.1)
Sodium: 139 mEq/L (ref 135–145)

## 2017-10-19 LAB — CBC WITH DIFFERENTIAL/PLATELET
BASOS ABS: 0.1 10*3/uL (ref 0.0–0.1)
BASOS PCT: 0.8 % (ref 0.0–3.0)
EOS ABS: 0.1 10*3/uL (ref 0.0–0.7)
Eosinophils Relative: 1.8 % (ref 0.0–5.0)
HCT: 39.7 % (ref 36.0–46.0)
Hemoglobin: 13.3 g/dL (ref 12.0–15.0)
LYMPHS ABS: 2.4 10*3/uL (ref 0.7–4.0)
Lymphocytes Relative: 36.7 % (ref 12.0–46.0)
MCHC: 33.4 g/dL (ref 30.0–36.0)
MCV: 92.8 fl (ref 78.0–100.0)
Monocytes Absolute: 0.6 10*3/uL (ref 0.1–1.0)
Monocytes Relative: 9.2 % (ref 3.0–12.0)
NEUTROS ABS: 3.4 10*3/uL (ref 1.4–7.7)
NEUTROS PCT: 51.5 % (ref 43.0–77.0)
PLATELETS: 253 10*3/uL (ref 150.0–400.0)
RBC: 4.28 Mil/uL (ref 3.87–5.11)
RDW: 13.8 % (ref 11.5–15.5)
WBC: 6.6 10*3/uL (ref 4.0–10.5)

## 2017-10-19 LAB — LIPID PANEL
CHOL/HDL RATIO: 3
Cholesterol: 200 mg/dL (ref 0–200)
HDL: 58.4 mg/dL (ref >39.00)
LDL Cholesterol: 129 mg/dL — ABNORMAL HIGH (ref 0–99)
NonHDL: 141.94
TRIGLYCERIDES: 67 mg/dL (ref 0.0–149.0)
VLDL: 13.4 mg/dL (ref 0.0–40.0)

## 2017-10-19 LAB — HEPATIC FUNCTION PANEL
ALBUMIN: 4.5 g/dL (ref 3.5–5.2)
ALT: 9 U/L (ref 0–35)
AST: 13 U/L (ref 0–37)
Alkaline Phosphatase: 95 U/L (ref 39–117)
BILIRUBIN DIRECT: 0.1 mg/dL (ref 0.0–0.3)
TOTAL PROTEIN: 7.8 g/dL (ref 6.0–8.3)
Total Bilirubin: 0.5 mg/dL (ref 0.2–1.2)

## 2017-10-19 LAB — HEMOGLOBIN A1C: HEMOGLOBIN A1C: 6.1 % (ref 4.6–6.5)

## 2017-10-19 LAB — TSH: TSH: 3.41 u[IU]/mL (ref 0.35–4.50)

## 2017-10-19 MED ORDER — ATORVASTATIN CALCIUM 40 MG PO TABS: 40.0000 mg | ORAL_TABLET | Freq: Every day | ORAL | 3 refills | Status: DC

## 2017-10-19 MED ORDER — POTASSIUM CHLORIDE ER 10 MEQ PO TBCR: 20.0000 meq | EXTENDED_RELEASE_TABLET | Freq: Every day | ORAL | 3 refills | Status: DC

## 2017-10-19 MED ORDER — MOMETASONE FURO-FORMOTEROL FUM 200-5 MCG/ACT IN AERO: 2.0000 | INHALATION_SPRAY | Freq: Two times a day (BID) | RESPIRATORY_TRACT | 11 refills | Status: DC

## 2017-10-19 MED ORDER — ZOLPIDEM TARTRATE 5 MG PO TABS: 5.0000 mg | ORAL_TABLET | Freq: Every evening | ORAL | 1 refills | Status: DC | PRN

## 2017-10-19 MED ORDER — LEVALBUTEROL TARTRATE 45 MCG/ACT IN AERO
2.0000 | INHALATION_SPRAY | Freq: Four times a day (QID) | RESPIRATORY_TRACT | 11 refills | Status: DC | PRN
Start: 2017-10-19 — End: 2018-04-19

## 2017-10-19 NOTE — Assessment & Plan Note (Signed)
stable overall by history and exam, recent data reviewed with pt, and pt to continue medical treatment as before,  to f/u any worsening symptoms or concerns for f/u lab today

## 2017-10-19 NOTE — Assessment & Plan Note (Signed)
stable overall by history and exam, recent data reviewed with pt, and pt to continue medical treatment as before,  to f/u any worsening symptoms or concerns BP Readings from Last 3 Encounters:  10/19/17 112/70  07/14/17 138/84  06/02/17 108/68

## 2017-10-19 NOTE — Assessment & Plan Note (Signed)
stable overall by history and exam, recent data reviewed with pt, and pt to continue medical treatment as before,  to f/u any worsening symptoms or concerns Lab Results  Component Value Date   TSH 3.41 10/19/2017

## 2017-10-19 NOTE — Progress Notes (Signed)
Subjective:    Patient ID: [name] [name], female    DOB: [birth year], 66 y.o.   MRN: [id number]  HPI  Here to f/u with daughter; overall doing ok,  Pt denies chest pain, increasing sob or doe, wheezing, orthopnea, PND, increased LE swelling, palpitations, dizziness or syncope.  Pt denies new neurological symptoms such as new headache, or facial or extremity weakness or numbness.  Pt denies polydipsia, polyuria, or low sugar episode.  Pt states overall good compliance with meds, mostly trying to follow appropriate diet, with wt overall stable,  but little exercise however.  Due for low K f/u. Due for DXA, note Has some GI intolerance to fosamax, would be ok with Prolia.  Also has 3rd toe right foot pain, bruising and sweling after hit but a running child 2 days ago.  Now retired Past Medical History:  Diagnosis Date  . ALCOHOL ABUSE, HX OF 11/06/2007  . ALLERGIC RHINITIS 11/06/2007  . Anxiety 04/03/2011  . ASTHMA 09/13/2007  . Chronic pain syndrome 12/15/2016  . COLONIC POLYPS, HX OF 02/09/2008    ADENOMATOUS POLYP  . HYPERLIPIDEMIA 11/03/2010  . HYPERTENSION 09/13/2007  . HYPOTHYROIDISM 11/06/2007  . Impaired glucose tolerance 07/30/2013  . INSOMNIA, HX OF 09/13/2007  . Lumbar degenerative disc disease 12/15/2016  . OSTEOPOROSIS 11/06/2007  . PERIMENOPAUSAL STATUS 09/13/2007  . Stroke ([place])   . TIA (transient ischemic attack) 11/04/2011   Past Surgical History:  Procedure Laterality Date  . BREAST EXCISIONAL BIOPSY     left  . BREAST SURGERY  2008 and 2012   x 2 - benign, left side  . carotid artery surgery Left   . CESAREAN SECTION     x 3  . COLONOSCOPY  multiple   2010    reports that  has never smoked. she has never used smokeless tobacco. She reports that she does not drink alcohol or use drugs. family history includes Breast cancer in her cousin; Coronary artery disease in her other; Hyperlipidemia in her other; Hypertension in her other. Allergies  Allergen Reactions  .  Belsomra [Suvorexant] Other (See Comments)    Visual psychedelic lights  . Fosamax [Alendronate Sodium] Nausea And Vomiting  . Penicillins Hives   Current Outpatient Medications on File Prior to Visit  Medication Sig Dispense Refill  . acetaminophen (TYLENOL) 325 MG tablet Take 650 mg by mouth every 6 (six) hours as needed.      Marland Kitchen amLODipine-olmesartan (AZOR) 10-40 MG tablet Take 1 tablet by mouth daily. 90 tablet 3  . clopidogrel (PLAVIX) 75 MG tablet TAKE 1 TABLET BY MOUTH DAILY 90 tablet 0  . diclofenac sodium (VOLTAREN) 1 % GEL Apply 4 g topically 4 (four) times daily as needed. 400 g 5  . hydrochlorothiazide (MICROZIDE) 12.5 MG capsule Take 1 capsule (12.5 mg total) by mouth daily. 90 capsule 3  . ibuprofen (ADVIL,MOTRIN) 800 MG tablet TAKE 1 TABLET(800 MG) BY MOUTH EVERY 8 HOURS AS NEEDED 90 tablet 0  . levothyroxine (SYNTHROID, LEVOTHROID) 25 MCG tablet Take 1 tablet (25 mcg total) by mouth daily before breakfast. 90 tablet 3  . montelukast (SINGULAIR) 10 MG tablet Take 1 tablet (10 mg total) by mouth daily. 90 tablet 3  . traMADol (ULTRAM) 50 MG tablet TAKE 1 TABLET BY MOUTH EVERY 8 HOURS AS NEEDED 90 tablet 5  . fexofenadine (ALLEGRA) 180 MG tablet Take 1 tablet (180 mg total) by mouth daily. 90 tablet 3   No current facility-administered medications on file prior to visit.  Review of Systems  Constitutional: Negative for other unusual diaphoresis or sweats HENT: Negative for ear discharge or swelling Eyes: Negative for other worsening visual disturbances Respiratory: Negative for stridor or other swelling  Gastrointestinal: Negative for worsening distension or other blood Genitourinary: Negative for retention or other urinary change Musculoskeletal: Negative for other MSK pain or swelling Skin: Negative for color change or other new lesions Neurological: Negative for worsening tremors and other numbness  Psychiatric/Behavioral: Negative for worsening agitation or other  fatigue All other system neg per pt    Objective:   Physical Exam BP 112/70   Pulse 78   Temp 98.1 F (36.7 C) (Oral)   Ht 4\' 11"  (1.499 m)   Wt 146 lb (66.2 kg)   SpO2 98%   BMI 29.49 kg/m VS noted,  Constitutional: Pt appears in NAD HENT: Head: NCAT.  Right Ear: External ear normal.  Left Ear: External ear normal.  Eyes: . Pupils are equal, round, and reactive to light. Conjunctivae and EOM are normal Nose: without d/c or deformity Neck: Neck supple. Gross normal ROM Cardiovascular: Normal rate and regular rhythm.   Pulmonary/Chest: Effort normal and breath sounds without rales or wheezing.  Abd:  Soft, NT, ND, + BS, no organomegaly Right 3rd toe 1+ tender, swelling bruised Neurological: Pt is alert. At baseline orientation, motor grossly intact Skin: Skin is warm. No rashes, other new lesions, no LE edema Psychiatric: Pt behavior is normal without agitation  No other exam findings    Assessment & Plan:

## 2017-10-19 NOTE — Assessment & Plan Note (Signed)
Cant r/o fx, for film today, buddy tape

## 2017-10-19 NOTE — Assessment & Plan Note (Signed)
stable overall by history and exam, recent data reviewed with pt, and pt to continue medical treatment as before,  to f/u any worsening symptoms or concerns Lab Results  Component Value Date   HGBA1C 6.1 10/19/2017

## 2017-10-19 NOTE — Patient Instructions (Addendum)
You had the Pneumovax shot today  Please continue all other medications as before, and refills have been done if requested.  Please have the pharmacy call with any other refills you may need.  Please continue your efforts at being more active, low cholesterol diet, and weight control.  You are otherwise up to date with prevention measures today.  Please keep your appointments with your specialists as you may have planned  Please schedule the bone density test before leaving today at the scheduling desk (where you check out)  Please go to the XRAY Department in the Basement (go straight as you get off the elevator) for the x-ray testing  Please go to the LAB in the Basement (turn left off the elevator) for the tests to be done today  You will be contacted by phone if any changes need to be made immediately.  Otherwise, you will receive a letter about your results with an explanation, but please check with MyChart first.  Please remember to sign up for MyChart if you have not done so, as this will be important to you in the future with finding out test results, communicating by private email, and scheduling acute appointments online when needed.  If you have Medicare related insurance (such as traditional Medicare, [employer] or [employer], or similar), Please make an appointment at the [facility] with Sharee Pimple, the [employer], for your Wellness Visit in this office, which is a benefit with your insurance.  Please return in 6 months, or sooner if needed

## 2017-10-20 ENCOUNTER — Telehealth: Payer: Self-pay

## 2017-10-20 LAB — HIV ANTIBODY (ROUTINE TESTING W REFLEX): HIV 1&2 Ab, 4th Generation: NONREACTIVE

## 2017-10-20 NOTE — Telephone Encounter (Signed)
-----   Message from Biagio Borg, MD sent at 10/19/2017  6:38 PM EST ----- Left message on MyChart, pt to cont same tx except  The test results show that your current treatment is OK, except the potassium is slightly low, and the LDL cholesterol is mildly high.  Please increase the potassium pills to 2 per day, and increase the lipitor to 40 mg per day.  I will send new prescriptions, and you should hear from the office as well.    [place] to please inform pt, I will do rx x 2

## 2017-10-20 NOTE — Telephone Encounter (Signed)
Pt has been informed and expressed understanding.  

## 2017-10-22 ENCOUNTER — Ambulatory Visit (INDEPENDENT_AMBULATORY_CARE_PROVIDER_SITE_OTHER)
Admission: RE | Admit: 2017-10-22 | Discharge: 2017-10-22 | Disposition: A | Discharge status: Home / Self Care | Payer: Medicare Other | Source: Ambulatory Visit | Attending: Internal Medicine | Admitting: Internal Medicine

## 2017-10-22 DIAGNOSIS — E2839 Other primary ovarian failure: Secondary | ICD-10-CM | POA: Diagnosis not present

## 2017-11-02 ENCOUNTER — Telehealth: Payer: Self-pay | Admitting: Internal Medicine

## 2017-11-02 NOTE — Telephone Encounter (Signed)
Pt called stating that the zolpidem (AMBIEN) 5 MG tablet prescription is requiring a Prior Authorization.

## 2017-11-08 ENCOUNTER — Other Ambulatory Visit: Payer: Self-pay | Admitting: Internal Medicine

## 2017-11-09 NOTE — Telephone Encounter (Signed)
Approved through 12/13/17  Determination sent to scan

## 2017-11-09 NOTE — Telephone Encounter (Signed)
Submitted 11/09/17 Key: [employer]

## 2017-11-29 ENCOUNTER — Other Ambulatory Visit: Payer: Self-pay | Admitting: Internal Medicine

## 2017-12-06 ENCOUNTER — Other Ambulatory Visit: Payer: Self-pay | Admitting: Internal Medicine

## 2017-12-08 ENCOUNTER — Other Ambulatory Visit: Payer: Self-pay | Admitting: Internal Medicine

## 2017-12-11 ENCOUNTER — Other Ambulatory Visit: Payer: Self-pay | Admitting: Internal Medicine

## 2017-12-13 ENCOUNTER — Telehealth: Payer: Self-pay | Admitting: Internal Medicine

## 2017-12-13 NOTE — Telephone Encounter (Signed)
Copied from [place] [id number]. Topic: Quick Communication - See Telephone Encounter >> Dec 13, 2017  2:55 PM Clack, Laban Emperor wrote: CRM for notification. See Telephone encounter for: Pt called stating that the zolpidem (AMBIEN) 5 MG tablet prescription is requiring a Prior Authorization.  Pt also states that she is not out of the medication as of yet but the prior auth takes so long, she does not want to run out.  12/13/17.

## 2017-12-21 ENCOUNTER — Other Ambulatory Visit: Payer: Self-pay | Admitting: Internal Medicine

## 2018-01-09 ENCOUNTER — Other Ambulatory Visit: Payer: Self-pay | Admitting: Internal Medicine

## 2018-02-08 ENCOUNTER — Telehealth: Payer: Self-pay

## 2018-02-08 NOTE — Telephone Encounter (Signed)
Patient calling to see if someone has sent her RX to pharmacy pt called 12-13-17 for her Ambien refill now she is completely out of this medicine since last night pt states that she had sent this request in early because it takes a while for the prior authorization to do through and now its the end of Feb and still haven't heard from anyone pt would like someone to give her a call on her cell phone today

## 2018-02-08 NOTE — Telephone Encounter (Signed)
Spoke with pt, she was informed that a PA was only received today from [employer]. I apologized for the lapse in hermedication but did let her know that the PA has already been approved and the determination faxed to her pharmacy for the year of 2019.

## 2018-02-08 NOTE — Telephone Encounter (Signed)
Approved through 12/13/18  Determination sent to scan  

## 2018-02-08 NOTE — Telephone Encounter (Signed)
Spoke wit pt via [place] transfer, she stated that the pharmacist told her to spilt 15 tablets, to make 2.5mg , and make it last 30 days.   I informed the patient of the conversation that I had with the pharmacist documented below and stated I could not confirm nor deny what she was told but informed her that she could pick up the remainder of the prescription in 15 days after those pills are used.    She the stated that she was going to call the pharmacist again to speak with her on this matter and then give her insurance company a call to see what else is covered because she has been going "through too much of a hassle to get this medication". I told her once she find out her other covered alternatives to give me a call and I will forward that information to Dr. [name]. She expressed understanding.

## 2018-02-08 NOTE — Telephone Encounter (Signed)
Called pt, LVM stating that the script was for a 90d supply. She should check with her insurance company to see if the approved PA covers a 30 or a 90 days supply. We cant change the covered quantity.   I also called the pharmacy. Abby, the pharmacist, stated that the patient picked up medication before the PA was approved and verified. She stated that the patient can come back to receive the remainder of the pills in 15 days when she is out of the supply she just picked up today.

## 2018-02-08 NOTE — Telephone Encounter (Signed)
Pt states the pharmacy only gave her 15/ 5 mg tablets and told her to split them in have.  Pt states her script is supposed for 5 mg a night. Pt states she did not know anything about this, and would like the dr to call in the other 15 tab.  Pt states she will not be able to sleep on this.  If this is correct, pt states she will need something else for  Sure!!  [facility] [place], [address] AT [address] [phone] (Phone) [phone] (Fax)

## 2018-02-27 ENCOUNTER — Other Ambulatory Visit: Payer: Self-pay | Admitting: Internal Medicine

## 2018-02-28 ENCOUNTER — Encounter: Payer: Self-pay | Admitting: Internal Medicine

## 2018-02-28 ENCOUNTER — Ambulatory Visit: Payer: Medicare Other | Admitting: Internal Medicine

## 2018-02-28 ENCOUNTER — Other Ambulatory Visit (INDEPENDENT_AMBULATORY_CARE_PROVIDER_SITE_OTHER): Payer: Medicare Other

## 2018-02-28 VITALS — BP 138/80 | HR 84 | Temp 98.4°F | Resp 16 | Ht 59.0 in | Wt 147.5 lb

## 2018-02-28 DIAGNOSIS — R202 Paresthesia of skin: Secondary | ICD-10-CM

## 2018-02-28 DIAGNOSIS — R200 Anesthesia of skin: Secondary | ICD-10-CM | POA: Diagnosis not present

## 2018-02-28 DIAGNOSIS — R51 Headache: Secondary | ICD-10-CM

## 2018-02-28 DIAGNOSIS — I10 Essential (primary) hypertension: Secondary | ICD-10-CM

## 2018-02-28 DIAGNOSIS — E039 Hypothyroidism, unspecified: Secondary | ICD-10-CM

## 2018-02-28 DIAGNOSIS — R519 Headache, unspecified: Secondary | ICD-10-CM

## 2018-02-28 DIAGNOSIS — F5104 Psychophysiologic insomnia: Secondary | ICD-10-CM | POA: Diagnosis not present

## 2018-02-28 LAB — CBC WITH DIFFERENTIAL/PLATELET
BASOS ABS: 0 10*3/uL (ref 0.0–0.1)
Basophils Relative: 0.5 % (ref 0.0–3.0)
EOS ABS: 0.1 10*3/uL (ref 0.0–0.7)
Eosinophils Relative: 2.4 % (ref 0.0–5.0)
HCT: 38.4 % (ref 36.0–46.0)
HEMOGLOBIN: 13.1 g/dL (ref 12.0–15.0)
LYMPHS ABS: 2 10*3/uL (ref 0.7–4.0)
Lymphocytes Relative: 36.6 % (ref 12.0–46.0)
MCHC: 34.1 g/dL (ref 30.0–36.0)
MCV: 90.9 fl (ref 78.0–100.0)
MONO ABS: 0.6 10*3/uL (ref 0.1–1.0)
Monocytes Relative: 10.3 % (ref 3.0–12.0)
NEUTROS PCT: 50.2 % (ref 43.0–77.0)
Neutro Abs: 2.7 10*3/uL (ref 1.4–7.7)
Platelets: 241 10*3/uL (ref 150.0–400.0)
RBC: 4.23 Mil/uL (ref 3.87–5.11)
RDW: 14.2 % (ref 11.5–15.5)
WBC: 5.4 10*3/uL (ref 4.0–10.5)

## 2018-02-28 LAB — BASIC METABOLIC PANEL
BUN: 17 mg/dL (ref 6–23)
CHLORIDE: 104 meq/L (ref 96–112)
CO2: 27 meq/L (ref 19–32)
CREATININE: 0.86 mg/dL (ref 0.40–1.20)
Calcium: 9.5 mg/dL (ref 8.4–10.5)
GFR: 84.94 mL/min (ref >60.00)
Glucose, Bld: 110 mg/dL — ABNORMAL HIGH (ref 70–99)
Potassium: 3.8 mEq/L (ref 3.5–5.1)
Sodium: 140 mEq/L (ref 135–145)

## 2018-02-28 LAB — MAGNESIUM: MAGNESIUM: 2.3 mg/dL (ref 1.5–2.5)

## 2018-02-28 LAB — SEDIMENTATION RATE: SED RATE: 50 mm/h — AB (ref 0–30)

## 2018-02-28 LAB — VITAMIN D 25 HYDROXY (VIT D DEFICIENCY, FRACTURES): VITD: 45.22 ng/mL (ref 30.00–100.00)

## 2018-02-28 LAB — TSH: TSH: 10.05 u[IU]/mL — AB (ref 0.35–4.50)

## 2018-02-28 MED ORDER — SUVOREXANT 15 MG PO TABS: 1.0000 | ORAL_TABLET | Freq: Every day | ORAL | 5 refills | Status: DC

## 2018-02-28 MED ORDER — LEVOTHYROXINE SODIUM 50 MCG PO TABS: 50.0000 ug | ORAL_TABLET | Freq: Every day | ORAL | 0 refills | Status: DC

## 2018-02-28 NOTE — Patient Instructions (Signed)

## 2018-02-28 NOTE — Progress Notes (Signed)
Subjective:  Patient ID: [name] [name], female    DOB: November 08, 1951  Age: 67 y.o. MRN: [id number]  CC: Headache and Hypertension   HPI Loretto B Grauberger presents for a BP check -She complains that she developed a frontal headache about 3 days ago.  She describes the headache as aching.  She checked her blood pressure and it was down to 99/62.  She checked it again later that day and it was at 104/66.  She subsequently stopped taking the calcium channel blocker and ARB.  Her blood pressure finally came up to 122/76 later that day but then 2 days ago it was back down to 95/58.  She denies any episodes of dizziness or lightheadedness.  She has never had a headache like this before.  She has had no episodes of nausea, vomiting, or slurred speech.  She does complain of a one-month history of numbness and tingling in her right upper extremity.  She tells me she has had prior CVAs.  She also complains of chronic insomnia and tells me that 5 mg of Ambien is not helping her sleep.  Outpatient Medications Prior to Visit  Medication Sig Dispense Refill  . acetaminophen (TYLENOL) 325 MG tablet Take 650 mg by mouth every 6 (six) hours as needed.      Marland Kitchen atorvastatin (LIPITOR) 40 MG tablet Take 1 tablet (40 mg total) daily by mouth. 90 tablet 3  . clopidogrel (PLAVIX) 75 MG tablet TAKE 1 TABLET BY MOUTH DAILY 90 tablet 0  . clopidogrel (PLAVIX) 75 MG tablet TAKE 1 TABLET(75 MG) BY MOUTH DAILY 90 tablet 0  . hydrochlorothiazide (MICROZIDE) 12.5 MG capsule TAKE 1 CAPSULE(12.5 MG) BY MOUTH DAILY 90 capsule 0  . levalbuterol (XOPENEX HFA) 45 MCG/ACT inhaler Inhale 2 puffs every 6 (six) hours as needed into the lungs for wheezing. 1 Inhaler 11  . mometasone-formoterol (DULERA) 200-5 MCG/ACT AERO Inhale 2 puffs 2 (two) times daily into the lungs. 1 Inhaler 11  . montelukast (SINGULAIR) 10 MG tablet TAKE 1 TABLET(10 MG) BY MOUTH DAILY 90 tablet 0  . potassium chloride (K-DUR) 10 MEQ tablet Take 2 tablets (20 mEq total)  daily by mouth. 180 tablet 3  . traMADol (ULTRAM) 50 MG tablet TAKE 1 TABLET BY MOUTH EVERY 8 HOURS AS NEEDED 90 tablet 5  . amLODipine-olmesartan (AZOR) 10-40 MG tablet TAKE 1 TABLET BY MOUTH DAILY 90 tablet 0  . ibuprofen (ADVIL,MOTRIN) 800 MG tablet TAKE 1 TABLET(800 MG) BY MOUTH EVERY 8 HOURS AS NEEDED 90 tablet 0  . levothyroxine (SYNTHROID, LEVOTHROID) 25 MCG tablet TAKE 1 TABLET(25 MCG) BY MOUTH DAILY BEFORE BREAKFAST 90 tablet 0  . zolpidem (AMBIEN) 5 MG tablet Take 1 tablet (5 mg total) at bedtime as needed by mouth for sleep. 90 tablet 1  . fexofenadine (ALLEGRA) 180 MG tablet Take 1 tablet (180 mg total) by mouth daily. 90 tablet 3  . diclofenac sodium (VOLTAREN) 1 % GEL Apply 4 g topically 4 (four) times daily as needed. 400 g 5  . potassium chloride (K-DUR) 10 MEQ tablet TAKE 1 TABLET(10 MEQ) BY MOUTH DAILY 90 tablet 0   No facility-administered medications prior to visit.     ROS Review of Systems  Constitutional: Positive for fatigue and unexpected weight change (wt gain). Negative for chills, diaphoresis and fever.  HENT: Negative.   Eyes: Negative for visual disturbance.  Respiratory: Negative for cough, chest tightness, shortness of breath and wheezing.   Cardiovascular: Negative for chest pain, palpitations and leg  swelling.  Gastrointestinal: Negative for abdominal pain, constipation, diarrhea, nausea and vomiting.  Endocrine: Negative for cold intolerance and heat intolerance.  Genitourinary: Negative.  Negative for decreased urine volume, difficulty urinating, flank pain and urgency.  Musculoskeletal: Negative.  Negative for myalgias and neck pain.  Skin: Negative.  Negative for color change, pallor and rash.  Neurological: Positive for headaches. Negative for dizziness, tremors, seizures, syncope, facial asymmetry, speech difficulty, weakness, light-headedness and numbness.  Hematological: Negative for adenopathy. Does not bruise/bleed easily.    Psychiatric/Behavioral: Negative.     Objective:  BP 138/80 (BP Location: Left Arm, Patient Position: Sitting, Cuff Size: Normal)   Pulse 84   Temp 98.4 F (36.9 C) (Oral)   Resp 16   Ht 4\' 11"  (1.499 m)   Wt 147 lb 8 oz (66.9 kg)   SpO2 95%   BMI 29.79 kg/m   BP Readings from Last 3 Encounters:  02/28/18 138/80  10/19/17 112/70  07/14/17 138/84    Wt Readings from Last 3 Encounters:  02/28/18 147 lb 8 oz (66.9 kg)  10/19/17 146 lb (66.2 kg)  07/14/17 144 lb (65.3 kg)    Physical Exam  Constitutional: She is oriented to person, place, and time. No distress.  HENT:  Head: Normocephalic and atraumatic.  There is no tenderness to palpation over the temporal arteries.  Eyes: Conjunctivae and EOM are normal. Pupils are equal, round, and reactive to light. Right eye exhibits no discharge. Left eye exhibits no discharge. No scleral icterus.  Neck: Normal range of motion. Neck supple. No JVD present. No thyromegaly present.  Cardiovascular: Normal rate, regular rhythm and normal heart sounds. Exam reveals no gallop.  No murmur heard. Pulmonary/Chest: Effort normal and breath sounds normal. No respiratory distress. She has no wheezes. She has no rales.  Abdominal: Soft. Bowel sounds are normal. She exhibits no distension and no mass. There is no tenderness. There is no guarding.  Musculoskeletal: Normal range of motion. She exhibits no edema, tenderness or deformity.  Lymphadenopathy:    She has no cervical adenopathy.  Neurological: She is alert and oriented to person, place, and time. She displays no atrophy, no tremor and normal reflexes. No cranial nerve deficit or sensory deficit. She exhibits normal muscle tone. She displays no seizure activity. Coordination and gait normal. She displays no Babinski's sign on the right side. She displays no Babinski's sign on the left side.  Reflex Scores:      Tricep reflexes are 1+ on the right side and 1+ on the left side.      Bicep  reflexes are 1+ on the right side and 1+ on the left side.      Brachioradialis reflexes are 1+ on the right side and 1+ on the left side.      Patellar reflexes are 1+ on the right side and 1+ on the left side.      Achilles reflexes are 1+ on the right side and 1+ on the left side. Skin: Skin is warm and dry. No rash noted. She is not diaphoretic. No erythema. No pallor.  Psychiatric: She has a normal mood and affect. Her behavior is normal. Judgment and thought content normal.  Vitals reviewed.   Lab Results  Component Value Date   WBC 5.4 02/28/2018   HGB 13.1 02/28/2018   HCT 38.4 02/28/2018   PLT 241.0 02/28/2018   GLUCOSE 110 (H) 02/28/2018   CHOL 200 10/19/2017   TRIG 67.0 10/19/2017   HDL 58.40 10/19/2017  LDLDIRECT 127.1 10/24/2010   LDLCALC 129 (H) 10/19/2017   ALT 9 10/19/2017   AST 13 10/19/2017   NA 140 02/28/2018   K 3.8 02/28/2018   CL 104 02/28/2018   CREATININE 0.86 02/28/2018   BUN 17 02/28/2018   CO2 27 02/28/2018   TSH 10.05 (H) 02/28/2018   INR 1.03 03/04/2011   HGBA1C 6.1 10/19/2017    Dg Bone Density  Result Date: 10/26/2017 Date of study: 10/22/2017 Exam: DUAL X-RAY ABSORPTIOMETRY (DXA) FOR BONE MINERAL DENSITY (BMD) Instrument: [employer] Requesting Provider: PCP Indication: screening for osteoporosis Comparison: 02/14/2008 Clinical data: Pt is a 67 y.o. female with previous history of toe fracture. Results:  Lumbar spine L1-L4 Femoral neck (FN) T-score 0.0 RFN: -1.5 LFN: -1.4 Change in BMD from previous DXA test (%) +2.8%* -3.6% (*) statistically significant Assessment: the BMD is low according to the [facility] classification for osteoporosis (see below). Fracture risk: moderate FRAX score: 10 year major osteoporotic risk: 3.9%. 10 year hip fracture risk: 0.4%. The thresholds for treatment are 20% and 3%, respectively. Comments: the technical quality of the study is good. Evaluation for secondary causes should be considered if clinically indicated.  Recommend optimizing calcium (1200 mg/day) and vitamin D (800 IU/day) intake. Followup: Repeat BMD is appropriate after 2 years. WHO criteria for diagnosis of osteoporosis in postmenopausal women and in men 38 y/o or older: - normal: T-score -1.0 to + 1.0 - osteopenia/low bone density: T-score between -2.5 and -1.0 - osteoporosis: T-score below -2.5 - severe osteoporosis: T-score below -2.5 with history of fragility fracture Note: although not part of the WHO classification, the presence of a fragility fracture, regardless of the T-score, should be considered diagnostic of osteoporosis, provided other causes for the fracture have been excluded. Treatment: The National Osteoporosis Foundation recommends that treatment be considered in postmenopausal women and men age 17 or older with: 1. Hip or vertebral (clinical or morphometric) fracture 2. T-score of - 2.5 or lower at the spine or hip 3. 10-year fracture probability by FRAX of at least 20% for a major osteoporotic fracture and 3% for a hip fracture Philemon Kingdom, MD [facility]    Assessment & Plan:   Stasia was seen today for headache and hypertension.  Diagnoses and all orders for this visit:  Essential hypertension- Her ecent blood pressures have been over controlled.  I have asked her to stop taking the ARB and the calcium channel blocker.  She will continue taking hydrochlorothiazide. -     Basic metabolic panel; Future -     Magnesium; Future -     VITAMIN D 25 Hydroxy (Vit-D Deficiency, Fractures); Future  Intractable episodic headache, unspecified headache type- Her lab work reveals a very slight increase in her sed rate but her headache location, description, and exam are not consistent with temporal arteritis.  I will scan her brain to see if there has been the development of a tumor or bleed.  She also has numbness in her right upper extremity so will screen for a recurrent or new CVA. -     CBC with Differential/Platelet;  Future -     Sedimentation rate; Future -     MR Brain Wo Contrast; Future  Numbness and tingling of right arm-     MR Brain Wo Contrast; Future- as above  Chronic insomnia -     Suvorexant (BELSOMRA) 15 MG TABS; Take 1 tablet by mouth daily.  Acquired hypothyroidism- Her TSH is elevated and she is symptomatic.  I have therefore increased her levothyroxine dose from 25 mcg a day to 50 mcg a day. -     TSH; Future -     levothyroxine (SYNTHROID, LEVOTHROID) 50 MCG tablet; Take 1 tablet (50 mcg total) by mouth daily.   I have discontinued Daine B. Creasman's diclofenac sodium, zolpidem, ibuprofen, levothyroxine, and amLODipine-olmesartan. I am also having her start on Suvorexant and levothyroxine. Additionally, I am having her maintain her acetaminophen, fexofenadine, clopidogrel, traMADol, mometasone-formoterol, levalbuterol, potassium chloride, atorvastatin, hydrochlorothiazide, montelukast, and clopidogrel.  Meds ordered this encounter  Medications  . Suvorexant (BELSOMRA) 15 MG TABS    Sig: Take 1 tablet by mouth daily.    Dispense:  30 tablet    Refill:  5  . levothyroxine (SYNTHROID, LEVOTHROID) 50 MCG tablet    Sig: Take 1 tablet (50 mcg total) by mouth daily.    Dispense:  90 tablet    Refill:  0     Follow-up: Return in about 6 weeks (around 04/11/2018).  Scarlette Calico, MD

## 2018-03-02 ENCOUNTER — Ambulatory Visit
Admission: RE | Admit: 2018-03-02 | Discharge: 2018-03-02 | Disposition: A | Discharge status: Home / Self Care | Payer: Medicare Other | Source: Ambulatory Visit | Attending: Internal Medicine | Admitting: Internal Medicine

## 2018-03-02 ENCOUNTER — Telehealth: Payer: Self-pay | Admitting: Internal Medicine

## 2018-03-02 DIAGNOSIS — R51 Headache: Principal | ICD-10-CM

## 2018-03-02 DIAGNOSIS — G479 Sleep disorder, unspecified: Secondary | ICD-10-CM

## 2018-03-02 DIAGNOSIS — R519 Headache, unspecified: Secondary | ICD-10-CM

## 2018-03-02 DIAGNOSIS — R200 Anesthesia of skin: Secondary | ICD-10-CM

## 2018-03-02 DIAGNOSIS — R202 Paresthesia of skin: Secondary | ICD-10-CM

## 2018-03-02 NOTE — Telephone Encounter (Signed)
[facility] for neurology referral

## 2018-03-02 NOTE — Telephone Encounter (Signed)
Copied from [place] [id number]. Topic: Referral - Request >> Mar 02, 2018 12:49 PM Carolyn Stare wrote:  Pt cal lto say she has been having sleeping problem for years and now she would like to have a referral to have a sleep study . Would like to see Dr Asencion Partridge  [place]  Pt phone number  [phone]

## 2018-03-02 NOTE — Telephone Encounter (Signed)
Copied from [place] [id number]. Topic: Quick Communication - See Telephone Encounter >> Mar 02, 2018  3:23 PM Aurelio Brash B wrote: CRM for notification. See Telephone encounter for:  PT is calling for results of brain scan, she wants to know if she has had more mini strokes 03/02/18.

## 2018-03-02 NOTE — Telephone Encounter (Signed)
Please advise 

## 2018-03-02 NOTE — Telephone Encounter (Signed)
MRI brain does not show any acute stroke, though it cannot tell abut mini strokes  Ok to cont same tx unless o/w directed per Dr [name] who ordered MRI

## 2018-03-02 NOTE — Addendum Note (Signed)
Addended by: Biagio Borg on: 03/02/2018 01:42 PM   Modules accepted: Orders

## 2018-03-03 ENCOUNTER — Encounter: Payer: Self-pay | Admitting: Internal Medicine

## 2018-03-03 NOTE — Telephone Encounter (Signed)
Pt has been informed. She was also made aware that the sleep study that she requested has been put in for her as well.

## 2018-03-05 ENCOUNTER — Other Ambulatory Visit: Payer: Self-pay | Admitting: Internal Medicine

## 2018-03-08 ENCOUNTER — Other Ambulatory Visit: Payer: Self-pay | Admitting: Internal Medicine

## 2018-03-14 ENCOUNTER — Encounter: Payer: Self-pay | Admitting: Internal Medicine

## 2018-03-21 ENCOUNTER — Other Ambulatory Visit: Payer: Self-pay | Admitting: Internal Medicine

## 2018-03-21 DIAGNOSIS — F5104 Psychophysiologic insomnia: Secondary | ICD-10-CM

## 2018-03-21 MED ORDER — HYDROCHLOROTHIAZIDE 12.5 MG PO CAPS: ORAL_CAPSULE | ORAL | 0 refills | Status: DC

## 2018-03-21 MED ORDER — SUVOREXANT 15 MG PO TABS
1.0000 | ORAL_TABLET | Freq: Every day | ORAL | 5 refills | Status: DC
Start: 2018-03-21 — End: 2018-04-19

## 2018-03-21 MED ORDER — MONTELUKAST SODIUM 10 MG PO TABS: ORAL_TABLET | ORAL | 0 refills | Status: DC

## 2018-03-21 NOTE — Telephone Encounter (Signed)
Sent maintenance meds pls advise on changing belsoma.Marland KitchenJohny Chess

## 2018-03-21 NOTE — Telephone Encounter (Signed)
Microzide, Singulair, Belsomra-wants Ambien instead of the Belsomra.   Says it does not work.  LOV 02/28/18 with [name]  [facility] [address] [place]  [address]

## 2018-03-21 NOTE — Telephone Encounter (Signed)
Done erx 

## 2018-03-21 NOTE — Telephone Encounter (Signed)
Copied from [place] [phone]. Topic: Quick Communication - Rx Refill/Question >> Mar 21, 2018 11:20 AM Percell Belt A wrote: Medication:  hydrochlorothiazide (MICROZIDE) 12.5 MG capsule [[id number]] - montelukast (SINGULAIR) 10 MG tablet [[id number]] Ambien- pt doesn't want to take the Suvorexant ([place]) 15 MG TABS [[id number]] she stated that this med does not work   Has the patient contacted their pharmacy?  (Agent: If no, request that the patient contact the pharmacy for the refill.) Preferred Pharmacy (with phone number or street name): [facility] [place]  Agent: Please be advised that RX refills may take up to 3 business days. We ask that you follow-up with your pharmacy.

## 2018-03-22 ENCOUNTER — Telehealth: Payer: Self-pay | Admitting: Internal Medicine

## 2018-03-22 MED ORDER — ZOLPIDEM TARTRATE 5 MG PO TABS: 5.0000 mg | ORAL_TABLET | Freq: Every evening | ORAL | 5 refills | Status: DC | PRN

## 2018-03-22 NOTE — Telephone Encounter (Signed)
Spoke to pt, she would like to go back on the zolpidem. Please advise.

## 2018-03-22 NOTE — Telephone Encounter (Signed)
Copied from [place] [id number]. Topic: Quick Communication - See Telephone Encounter >> Mar 22, 2018  1:48 PM Synthia Innocent wrote: CRM for notification. See Telephone encounter for: 03/22/18. Would like to speak to nurse regarding going back on zolpidem (AMBIEN) 10 MG tablet

## 2018-03-22 NOTE — Telephone Encounter (Signed)
Done erx 

## 2018-03-22 NOTE — Addendum Note (Signed)
Addended by: Biagio Borg on: 03/22/2018 07:56 PM   Modules accepted: Orders

## 2018-04-19 ENCOUNTER — Other Ambulatory Visit: Payer: Self-pay | Admitting: Internal Medicine

## 2018-04-19 ENCOUNTER — Ambulatory Visit: Payer: Medicare Other | Admitting: Internal Medicine

## 2018-04-19 ENCOUNTER — Encounter: Payer: Self-pay | Admitting: Internal Medicine

## 2018-04-19 ENCOUNTER — Other Ambulatory Visit (INDEPENDENT_AMBULATORY_CARE_PROVIDER_SITE_OTHER): Payer: Medicare Other

## 2018-04-19 VITALS — BP 144/96 | HR 61 | Temp 97.8°F | Ht 59.0 in | Wt 143.0 lb

## 2018-04-19 DIAGNOSIS — Z7902 Long term (current) use of antithrombotics/antiplatelets: Secondary | ICD-10-CM | POA: Insufficient documentation

## 2018-04-19 DIAGNOSIS — I10 Essential (primary) hypertension: Secondary | ICD-10-CM | POA: Diagnosis not present

## 2018-04-19 DIAGNOSIS — G4700 Insomnia, unspecified: Secondary | ICD-10-CM | POA: Diagnosis not present

## 2018-04-19 DIAGNOSIS — R7302 Impaired glucose tolerance (oral): Secondary | ICD-10-CM

## 2018-04-19 DIAGNOSIS — Z0000 Encounter for general adult medical examination without abnormal findings: Secondary | ICD-10-CM | POA: Diagnosis not present

## 2018-04-19 LAB — CBC WITH DIFFERENTIAL/PLATELET
BASOS PCT: 0.2 % (ref 0.0–3.0)
Basophils Absolute: 0 10*3/uL (ref 0.0–0.1)
EOS PCT: 2.7 % (ref 0.0–5.0)
Eosinophils Absolute: 0.2 10*3/uL (ref 0.0–0.7)
HCT: 41.2 % (ref 36.0–46.0)
Hemoglobin: 13.7 g/dL (ref 12.0–15.0)
LYMPHS ABS: 2.2 10*3/uL (ref 0.7–4.0)
Lymphocytes Relative: 36.3 % (ref 12.0–46.0)
MCHC: 33.3 g/dL (ref 30.0–36.0)
MCV: 91.7 fl (ref 78.0–100.0)
MONO ABS: 0.6 10*3/uL (ref 0.1–1.0)
Monocytes Relative: 10.3 % (ref 3.0–12.0)
NEUTROS ABS: 3 10*3/uL (ref 1.4–7.7)
Neutrophils Relative %: 50.5 % (ref 43.0–77.0)
PLATELETS: 222 10*3/uL (ref 150.0–400.0)
RBC: 4.49 Mil/uL (ref 3.87–5.11)
RDW: 14.3 % (ref 11.5–15.5)
WBC: 5.9 10*3/uL (ref 4.0–10.5)

## 2018-04-19 LAB — LIPID PANEL
CHOL/HDL RATIO: 3
Cholesterol: 185 mg/dL (ref 0–200)
HDL: 64.7 mg/dL (ref >39.00)
LDL Cholesterol: 107 mg/dL — ABNORMAL HIGH (ref 0–99)
NONHDL: 120.62
Triglycerides: 70 mg/dL (ref 0.0–149.0)
VLDL: 14 mg/dL (ref 0.0–40.0)

## 2018-04-19 LAB — URINALYSIS, ROUTINE W REFLEX MICROSCOPIC
BILIRUBIN URINE: NEGATIVE
Hgb urine dipstick: NEGATIVE
Ketones, ur: NEGATIVE
LEUKOCYTES UA: NEGATIVE
Nitrite: NEGATIVE
PH: 6 (ref 5.0–8.0)
Specific Gravity, Urine: <=1.005 — AB (ref 1.000–1.030)
Total Protein, Urine: NEGATIVE
Urine Glucose: NEGATIVE
Urobilinogen, UA: 0.2 (ref 0.0–1.0)

## 2018-04-19 LAB — HEPATIC FUNCTION PANEL
ALT: 12 U/L (ref 0–35)
AST: 15 U/L (ref 0–37)
Albumin: 4.2 g/dL (ref 3.5–5.2)
Alkaline Phosphatase: 94 U/L (ref 39–117)
BILIRUBIN TOTAL: 0.6 mg/dL (ref 0.2–1.2)
Bilirubin, Direct: 0 mg/dL (ref 0.0–0.3)
Total Protein: 7.4 g/dL (ref 6.0–8.3)

## 2018-04-19 LAB — BASIC METABOLIC PANEL
BUN: 16 mg/dL (ref 6–23)
CO2: 30 meq/L (ref 19–32)
Calcium: 9.5 mg/dL (ref 8.4–10.5)
Chloride: 101 mEq/L (ref 96–112)
Creatinine, Ser: 0.82 mg/dL (ref 0.40–1.20)
GFR: 89.7 mL/min (ref >60.00)
GLUCOSE: 105 mg/dL — AB (ref 70–99)
POTASSIUM: 3.4 meq/L — AB (ref 3.5–5.1)
SODIUM: 140 meq/L (ref 135–145)

## 2018-04-19 LAB — HEMOGLOBIN A1C: Hgb A1c MFr Bld: 6.1 % (ref 4.6–6.5)

## 2018-04-19 LAB — TSH: TSH: 5.39 u[IU]/mL — AB (ref 0.35–4.50)

## 2018-04-19 MED ORDER — TRAMADOL HCL 50 MG PO TABS: 50.0000 mg | ORAL_TABLET | Freq: Three times a day (TID) | ORAL | 5 refills | Status: DC | PRN

## 2018-04-19 MED ORDER — BENAZEPRIL-HYDROCHLOROTHIAZIDE 10-12.5 MG PO TABS: 1.0000 | ORAL_TABLET | Freq: Every day | ORAL | 3 refills | Status: DC

## 2018-04-19 MED ORDER — LEVOTHYROXINE SODIUM 75 MCG PO TABS: 75.0000 ug | ORAL_TABLET | Freq: Every day | ORAL | 3 refills | Status: DC

## 2018-04-19 MED ORDER — ZOLPIDEM TARTRATE 10 MG PO TABS: 10.0000 mg | ORAL_TABLET | Freq: Every evening | ORAL | 1 refills | Status: DC | PRN

## 2018-04-19 NOTE — Progress Notes (Signed)
Subjective:    Patient ID: [name] [name], female    DOB: 08-05-1951, 67 y.o.   MRN: [id number]  HPI  Here for wellness and f/u;  Overall doing ok;  Pt denies Chest pain, worsening SOB, DOE, wheezing, orthopnea, PND, worsening LE edema, palpitations, dizziness or syncope.  Pt denies neurological change such as new headache, facial or extremity weakness.  Pt denies polydipsia, polyuria, or low sugar symptoms. Pt states overall good compliance with treatment and medications, good tolerability, and has been trying to follow appropriate diet.  Pt denies worsening depressive symptoms, suicidal ideation or panic. No fever, night sweats, wt loss, loss of appetite, or other constitutional symptoms.  Pt states good ability with ADL's, has low fall risk, home safety reviewed and adequate, no other significant changes in hearing or vision, and only occasionally active with exercise. Keeping busy with 2 grandchildren she keeps most of the day for her daughter in [place] school.  Taken off azor last visit due to lower pressures.   Wt Readings from Last 3 Encounters:  04/19/18 143 lb (64.9 kg)  02/28/18 147 lb 8 oz (66.9 kg)  10/19/17 146 lb (66.2 kg)  Still with sleeping difficutly, belsomra expensive and didn't work for 2 wk trial.  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  Due for sleep study next Monday.  Has tried temazepam and trazodone as well.   Past Medical History:  Diagnosis Date  . ALCOHOL ABUSE, HX OF 11/06/2007  . ALLERGIC RHINITIS 11/06/2007  . Anxiety 04/03/2011  . ASTHMA 09/13/2007  . Chronic pain syndrome 12/15/2016  . COLONIC POLYPS, HX OF 02/09/2008    ADENOMATOUS POLYP  . HYPERLIPIDEMIA 11/03/2010  . HYPERTENSION 09/13/2007  . HYPOTHYROIDISM 11/06/2007  . Impaired glucose tolerance 07/30/2013  . INSOMNIA, HX OF 09/13/2007  . Lumbar degenerative disc disease 12/15/2016  . OSTEOPOROSIS 11/06/2007  . PERIMENOPAUSAL STATUS 09/13/2007  . Stroke ([place])   . TIA (transient ischemic attack) 11/04/2011    Past Surgical History:  Procedure Laterality Date  . BREAST EXCISIONAL BIOPSY     left  . BREAST SURGERY  2008 and 2012   x 2 - benign, left side  . carotid artery surgery Left   . CESAREAN SECTION     x 3  . COLONOSCOPY  multiple   2010    reports that she has never smoked. She has never used smokeless tobacco. She reports that she does not drink alcohol or use drugs. family history includes Breast cancer in her cousin; Coronary artery disease in her other; Hyperlipidemia in her other; Hypertension in her other. Allergies  Allergen Reactions  . Fosamax [Alendronate Sodium] Nausea And Vomiting  . Penicillins Hives   Current Outpatient Medications on File Prior to Visit  Medication Sig Dispense Refill  . acetaminophen (TYLENOL) 325 MG tablet Take 650 mg by mouth every 6 (six) hours as needed.      Marland Kitchen atorvastatin (LIPITOR) 40 MG tablet Take 1 tablet (40 mg total) daily by mouth. 90 tablet 3  . clopidogrel (PLAVIX) 75 MG tablet TAKE 1 TABLET BY MOUTH DAILY 90 tablet 0  . hydrochlorothiazide (MICROZIDE) 12.5 MG capsule TAKE 1 CAPSULE(12.5 MG) BY MOUTH DAILY 30 capsule 0  . levothyroxine (SYNTHROID, LEVOTHROID) 50 MCG tablet Take 1 tablet (50 mcg total) by mouth daily. 90 tablet 0  . mometasone-formoterol (DULERA) 200-5 MCG/ACT AERO Inhale 2 puffs 2 (two) times daily into the lungs. 1 Inhaler 11  . montelukast (SINGULAIR) 10 MG tablet TAKE 1 TABLET(10 MG)  BY MOUTH DAILY 30 tablet 0  . potassium chloride (K-DUR) 10 MEQ tablet Take 2 tablets (20 mEq total) daily by mouth. 180 tablet 3  . traMADol (ULTRAM) 50 MG tablet TAKE 1 TABLET BY MOUTH EVERY 8 HOURS AS NEEDED 90 tablet 5  . zolpidem (AMBIEN) 5 MG tablet Take 1 tablet (5 mg total) by mouth at bedtime as needed for sleep. 30 tablet 5   No current facility-administered medications on file prior to visit.    Review of Systems Constitutional: Negative for other unusual diaphoresis, sweats, appetite or weight changes HENT: Negative  for other worsening hearing loss, ear pain, facial swelling, mouth sores or neck stiffness.   Eyes: Negative for other worsening pain, redness or other visual disturbance.  Respiratory: Negative for other stridor or swelling Cardiovascular: Negative for other palpitations or other chest pain  Gastrointestinal: Negative for worsening diarrhea or loose stools, blood in stool, distention or other pain Genitourinary: Negative for hematuria, flank pain or other change in urine volume.  Musculoskeletal: Negative for myalgias or other joint swelling.  Skin: Negative for other color change, or other wound or worsening drainage.  Neurological: Negative for other syncope or numbness. Hematological: Negative for other adenopathy or swelling Psychiatric/Behavioral: Negative for hallucinations, other worsening agitation, SI, self-injury, or new decreased concentration All other system neg per pt    Objective:   Physical Exam BP (!) 144/96   Pulse 61   Temp 97.8 F (36.6 C) (Oral)   Ht 4\' 11"  (1.499 m)   Wt 143 lb (64.9 kg)   SpO2 98%   BMI 28.88 kg/m  VS noted,  Constitutional: Pt is oriented to person, place, and time. Appears well-developed and well-nourished, in no significant distress and comfortable Head: Normocephalic and atraumatic  Eyes: Conjunctivae and EOM are normal. Pupils are equal, round, and reactive to light Right Ear: External ear normal without discharge Left Ear: External ear normal without discharge Nose: Nose without discharge or deformity Mouth/Throat: Oropharynx is without other ulcerations and moist  Neck: Normal range of motion. Neck supple. No JVD present. No tracheal deviation present or significant neck LA or mass Cardiovascular: Normal rate, regular rhythm, normal heart sounds and intact distal pulses.   Pulmonary/Chest: WOB normal and breath sounds without rales or wheezing  Abdominal: Soft. Bowel sounds are normal. NT. No HSM  Musculoskeletal: Normal range of  motion. Exhibits no edema Lymphadenopathy: Has no other cervical adenopathy.  Neurological: Pt is alert and oriented to person, place, and time. Pt has normal reflexes. No cranial nerve deficit. Motor grossly intact, Gait intact Skin: Skin is warm and dry. No rash noted or new ulcerations Psychiatric:  Has normal mood and affect. Behavior is normal without agitation No other exam findings Lab Results  Component Value Date   WBC 5.4 02/28/2018   HGB 13.1 02/28/2018   HCT 38.4 02/28/2018   PLT 241.0 02/28/2018   GLUCOSE 110 (H) 02/28/2018   CHOL 200 10/19/2017   TRIG 67.0 10/19/2017   HDL 58.40 10/19/2017   LDLDIRECT 127.1 10/24/2010   LDLCALC 129 (H) 10/19/2017   ALT 9 10/19/2017   AST 13 10/19/2017   NA 140 02/28/2018   K 3.8 02/28/2018   CL 104 02/28/2018   CREATININE 0.86 02/28/2018   BUN 17 02/28/2018   CO2 27 02/28/2018   TSH 10.05 (H) 02/28/2018   INR 1.03 03/04/2011   HGBA1C 6.1 10/19/2017       Assessment & Plan:

## 2018-04-19 NOTE — Assessment & Plan Note (Signed)
Ok for increase ambien 10 qhs prn, may need PA to get authorized

## 2018-04-19 NOTE — Patient Instructions (Addendum)
OK to change the HCT 12.5 mg to benazepril HCT 10-12.5 mg  - 1 pill per day  Please continue all other medications as before, and refills have been done if requested - the ambien  Please have the pharmacy call with any other refills you may need.  Please continue your efforts at being more active, low cholesterol diet, and weight control.  You are otherwise up to date with prevention measures today.  Please keep your appointments with your specialists as you may have planned  Please go to the LAB in the Basement (turn left off the elevator) for the tests to be done today  You will be contacted by phone if any changes need to be made immediately.  Otherwise, you will receive a letter about your results with an explanation, but please check with MyChart first.   Please remember to sign up for MyChart if you have not done so, as this will be important to you in the future with finding out test results, communicating by private email, and scheduling acute appointments online when needed.  Please return in 6 months, or sooner if needed

## 2018-04-19 NOTE — Assessment & Plan Note (Signed)

## 2018-04-19 NOTE — Assessment & Plan Note (Signed)
stable overall by history and exam, recent data reviewed with pt, and pt to continue medical treatment as before,  to f/u any worsening symptoms or concerns. Lab Results  Component Value Date   HGBA1C 6.1 10/19/2017

## 2018-04-19 NOTE — Assessment & Plan Note (Signed)
Mild uncontrolled, to change the hct 12. 5 to benazepril HCT 10-12.5

## 2018-04-20 ENCOUNTER — Telehealth: Payer: Self-pay

## 2018-04-20 NOTE — Telephone Encounter (Signed)
-----   Message from Biagio Borg, MD sent at 04/19/2018 12:36 PM EDT ----- Left message on MyChart, pt to cont same tx except  The test results show that your current treatment is OK, except the TSH is still slightly elevated, which Nine you would need a little more thyroid medication.  We should increase the levothyroxine from 50 to 75 mcg per day  I will send a new prescription, and you should hear from the office as well  Shirron to please inform pt, I will do rx

## 2018-04-20 NOTE — Telephone Encounter (Signed)
Pt has been informed and expressed understanding.  

## 2018-04-25 ENCOUNTER — Encounter: Payer: Self-pay | Admitting: Neurology

## 2018-04-25 ENCOUNTER — Ambulatory Visit: Payer: Medicare Other | Admitting: Neurology

## 2018-04-25 ENCOUNTER — Other Ambulatory Visit: Payer: Self-pay | Admitting: Neurology

## 2018-04-25 VITALS — BP 153/85 | HR 72 | Ht 59.0 in | Wt 142.0 lb

## 2018-04-25 DIAGNOSIS — F5104 Psychophysiologic insomnia: Secondary | ICD-10-CM

## 2018-04-25 DIAGNOSIS — R002 Palpitations: Secondary | ICD-10-CM | POA: Diagnosis not present

## 2018-04-25 DIAGNOSIS — N951 Menopausal and female climacteric states: Secondary | ICD-10-CM | POA: Diagnosis not present

## 2018-04-25 DIAGNOSIS — R61 Generalized hyperhidrosis: Secondary | ICD-10-CM | POA: Insufficient documentation

## 2018-04-25 NOTE — Patient Instructions (Signed)
Please remember to try to maintain good sleep hygiene, which [name] [name]: Keep a regular sleep and wake schedule, try not to exercise or have a meal within 2 hours of your bedtime, try to keep your bedroom conducive for sleep, that is, cool and dark, without light distractors such as an illuminated alarm clock, and refrain from watching TV right before sleep or in the middle of the night and do not keep the TV or radio on during the night. Also, try not to use or play on electronic devices at bedtime, such as your cell phone, tablet PC or laptop. If you like to read at bedtime on an electronic device, try to dim the background light as much as possible. Do not eat in the middle of the night.   We will request a sleep study.    We will look for leg twitching and snoring or sleep apnea.   For chronic insomnia, you are best followed by a psychiatrist and/or sleep psychologist.   We will call you with the sleep study results and make a follow up appointment if needed.    

## 2018-04-25 NOTE — Addendum Note (Signed)
Addended by: Larey Seat on: 04/25/2018 09:57 AM   Modules accepted: Orders

## 2018-04-25 NOTE — Progress Notes (Signed)
SLEEP MEDICINE CLINIC   Provider:  Larey Seat, M D  Primary Care Physician:  Biagio Borg, MD   Referring Provider: Biagio Borg, MD    Chief Complaint  Patient presents with  . New Patient (Initial Visit)    pt alone, rm 10. pt states that every since she went through menopause, she has not been able to sleep. she states she will sleep 2 hours wake up 4 hours. She states that she can go days like this without sleep. pt states she just wants to know what is wrong with her.     HPI:  [name] [name] is a 67 y.o. female , seen here as in a referral  from [name]/ [name] for a sleep disorder evaluation.   Chief complaint according to patient : " Ever since I went through menopause and cannot sleep, I wondered why" I cannot shut my brain off, and I stay busy all day - I cannot nap in daytime either" .  Sometimes she stays awake for 2-3 days ... [name] prescribed Ambien.  I have the pleasure of meeting [name] [name] today on 25 Apr 2018, she is a 67 year old African-American female and recently retired Radio producer.  Since her retirement she keeps her to toddler grandchildren in daytime, keeps very active and still feels she cannot sleep well sometimes at night not at all.  She presented to her primary care physician's office in March with headaches, as found to have low BP.  [name] asked her to stop 1 of her blood pressure medicines, she was on a combination of a calcium channel blocker and ARB.  Her blood pressure finally came back up but 2 days later it was back down again.  She did not feel really dizzy or lightheaded but the headache was her main symptom.  She had some tingling in her right upper extremity and reportedly has a history of prior TIAs.  She remains on Plavix, Lipitor, hydrochlorothiazide, on an albuterol inhaler for asthma as she claims, she is on Dulera, Singulair, potassium supplement, she takes tramadol-Ultram as needed for pain, Allegra,  she takes Ambien 5 mg at night, Synthroid.  On 18 March of this year her blood pressure had rebounded.  She had no tenderness over the temporal arteries, she has a high TSH, higher than normal fasting glucose, her neurologic exam was nonfocal at the time.   An MRI of the brain without contrast was ordered to evaluate the tingling and numbness of the right arm, and Dr. [name] also ordered Belsomra 50 mg tablets as a sleep aid. She took those for 12 days and they did not work. She couldn't tolerate trazodone she stated.    Sleep habits are as follows: she is aiming for a bedtime of 11 Pm, and for the last hour of the day she is reading in her bible- and praying. No electronics , lights are dim.  She describes her bedroom as conducive to sleep, there are no distracting lights, sounds it is cool, quiet and dark.  She sleeps alone in her bed. She does not have a preferred sleep position, she sleeps on only one flat but firm pillow.  Her bed is not adjustable. She takes 2 Tylenol PM with Benadryl, and Ambien 5 mg between 8 and 9 PM and still struggles to go to bed and sleep before midnight. She does not have to go to the bathroom at night, her mother lives with her  and has not noted her to snore.  The patient herself is not sure if she snores or not or if she may have apnea or not.  She still suffers from severe hot flashes even at night, and sometimes wakes up with palpitations and drenched in sweat.  She does not recall vivid dreams but she used to have 10 or 20 years ago. She rises at 8 AM to take care of her grand children. Her daughter works at [employer] as a Database administrator and is  in NP school. She picks her children up at 7 or 8 PM.    Sleep medical history and family sleep history:  Menopausal, mind racing. Insomnia.Since age 79 .  Social history:  67 year old african [place] female patient who [place].  Quit smoking 25 years ago and quit drinking 25 years ago. Caffeine : she drinks a pepsi in AM, and  never past 6 Pm, no iced or hot tea, no coffee, no energy drinks. 30 years ago worked night shifts - and had early, early perimenopause at that time- she "never recovered" , never used HRT. Exercise- walking 6 miles a day 3 days a week.   Divorced - but has a civil relationship with her ex spouse. Mother of 3 sucessfull children, living with her mother. 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    Review of Systems: Out of a complete 14 system review, the patient complains of only the following symptoms, and all other reviewed systems are negative.  Racing thoughts. Worries about things she knows she cannot change. Anxious - but has no reason to.   Epworth score: 0/ 24  , Fatigue severity score 9/24   , depression score 0/15 .    Social History   Socioeconomic History  . Marital status: Divorced    Spouse name: Not on file  . Number of children: 3  . Years of education: Not on file  . Highest education level: Not on file  Occupational History  . Occupation: Nurse, mental health: [place]  Social Needs  . Financial resource strain: Not on file  . Food insecurity:    Worry: Not on file    Inability: Not on file  . Transportation needs:    Medical: Not on file    Non-medical: Not on file  Tobacco Use  . Smoking status: Never Smoker  . Smokeless tobacco: Never Used  Substance and Sexual Activity  . Alcohol use: No  . Drug use: No  . Sexual activity: Not on file  Lifestyle  . Physical activity:    Days per week: Not on file    Minutes per session: Not on file  . Stress: Not on file  Relationships  . Social connections:    Talks on phone: Not on file    Gets together: Not on file    Attends religious service: Not on file    Active member of club or organization: Not on file    Attends meetings of clubs or organizations: Not on file    Relationship status: Not on file  . Intimate partner violence:    Fear of current or ex partner:  Not on file    Emotionally abused: Not on file    Physically abused: Not on file    Forced sexual activity: Not on file  Other Topics Concern  . Not on file  Social History Narrative  . Not on file  Family History  Problem Relation Age of Onset  . Hyperlipidemia Other   . Hypertension Other   . Coronary artery disease Other   . Breast cancer Cousin   . Hypertension Mother   . Heart disease Mother   . Dementia Maternal Grandmother   . Colon cancer Neg Hx     Past Medical History:  Diagnosis Date  . ALCOHOL ABUSE, HX OF 11/06/2007  . ALLERGIC RHINITIS 11/06/2007  . Anxiety 04/03/2011  . ASTHMA 09/13/2007  . Chronic pain syndrome 12/15/2016  . COLONIC POLYPS, HX OF 02/09/2008    ADENOMATOUS POLYP  . Encounter for well adult exam without abnormal findings 04/03/2011  . HYPERLIPIDEMIA 11/03/2010  . HYPERTENSION 09/13/2007  . HYPOTHYROIDISM 11/06/2007  . Impaired glucose tolerance 07/30/2013  . INSOMNIA, HX OF 09/13/2007  . Lumbar degenerative disc disease 12/15/2016  . OSTEOPOROSIS 11/06/2007  . PERIMENOPAUSAL STATUS 09/13/2007  . Stroke ([place])   . TIA (transient ischemic attack) 11/04/2011    Past Surgical History:  Procedure Laterality Date  . BREAST EXCISIONAL BIOPSY     left  . BREAST SURGERY  2008 and 2012   x 2 - benign, left side  . carotid artery surgery Left   . CESAREAN SECTION     x 3  . COLONOSCOPY  multiple   2010    Current Outpatient Medications  Medication Sig Dispense Refill  . acetaminophen (TYLENOL) 325 MG tablet Take 650 mg by mouth every 6 (six) hours as needed.      Marland Kitchen atorvastatin (LIPITOR) 40 MG tablet Take 1 tablet (40 mg total) daily by mouth. 90 tablet 3  . benazepril-hydrochlorthiazide (LOTENSIN HCT) 10-12.5 MG tablet Take 1 tablet by mouth daily. 90 tablet 3  . clopidogrel (PLAVIX) 75 MG tablet TAKE 1 TABLET BY MOUTH DAILY 90 tablet 0  . hydrochlorothiazide (MICROZIDE) 12.5 MG capsule TAKE 1 CAPSULE(12.5 MG) BY MOUTH DAILY 30 capsule 11  .  levothyroxine (SYNTHROID, LEVOTHROID) 75 MCG tablet Take 1 tablet (75 mcg total) by mouth daily before breakfast. 90 tablet 3  . mometasone-formoterol (DULERA) 200-5 MCG/ACT AERO Inhale 2 puffs 2 (two) times daily into the lungs. 1 Inhaler 11  . montelukast (SINGULAIR) 10 MG tablet TAKE 1 TABLET(10 MG) BY MOUTH DAILY 30 tablet 11  . potassium chloride (K-DUR) 10 MEQ tablet Take 2 tablets (20 mEq total) daily by mouth. 180 tablet 3  . traMADol (ULTRAM) 50 MG tablet Take 1 tablet (50 mg total) by mouth every 8 (eight) hours as needed. 90 tablet 5  . zolpidem (AMBIEN) 10 MG tablet Take 1 tablet (10 mg total) by mouth at bedtime as needed for sleep. 90 tablet 1   No current facility-administered medications for this visit.     Allergies as of 04/25/2018 - Review Complete 04/25/2018  Allergen Reaction Noted  . Fosamax [alendronate sodium] Nausea And Vomiting 03/13/2011  . Penicillins Hives     Vitals: BP (!) 153/85   Pulse 72   Ht 4\' 11"  (1.499 m)   Wt 142 lb (64.4 kg)   BMI 28.68 kg/m  Last Weight:  Wt Readings from Last 1 Encounters:  04/25/18 142 lb (64.4 kg)   LPF:XTKW mass index is 28.68 kg/m.     Last Height:   Ht Readings from Last 1 Encounters:  04/25/18 4\' 11"  (1.499 m)    Physical exam:  General: The patient is awake, alert and appears not in acute distress. The patient is well groomed. Head: Normocephalic, atraumatic. Neck is supple. Mallampati 5 ,  neck circumference:14. Nasal airflow patent ,  Mild retrognathia is seen.  Cardiovascular:  Regular rate and rhythm , without  murmurs or carotid bruit, and without distended neck veins. Respiratory: Lungs are clear to auscultation.( She reports she had wheezing in March  ) . Skin:  Without evidence of edema, or rash Trunk: BMI is 28.7. The patient's posture is erect  Neurologic exam : The patient is awake and alert, oriented to place and time.     Attention span & concentration ability appears normal.  Speech is  fluent,  Without dysarthria, dysphonia or aphasia.  Mood and affect are appropriate.  Cranial nerves: Pupils are equal and briskly reactive to light. Funduscopic exam without evidence of pallor or edema. Extraocular movements  in vertical and horizontal planes intact and without nystagmus. Visual fields by finger perimetry are intact.Hearing to finger rub intact.  Facial sensation intact to fine touch. Facial motor strength is symmetric and tongue and uvula move midline. Shoulder shrug was symmetrical.   Motor exam:   Normal tone, muscle bulk and symmetric strength in all extremities. Sensory:  Fine touch, pinprick and vibration were normal . Proprioception tested in the upper extremities was normal. Coordination: Rapid alternating movements in the fingers/hands and  Finger-to-nose maneuver were  normal - without evidence of ataxia, dysmetria or tremor. Gait and station: Patient walks without assistive device and is able unassisted to climb up to the exam table. Strength within normal limits. Stance is stable and normal.  Turns with 3 Steps. Romberg testing is  negative. Deep tendon reflexes: in the  upper and lower extremities are symmetric and intact. .  Assessment:  After physical and neurologic examination, review of laboratory studies,  Personal review of imaging studies, reports of other /same  Imaging studies, results of polysomnography and / or neurophysiology testing and pre-existing records as far as provided in visit., my assessment is   1) [name] [name] has a history of asthma, but she does not feel short of breath, does not wake up this morning headaches and often does not even have a dry mouth.  She is not sure that she snores and nobody has told her that she has apnea.    Her challenge is that she has trouble to fall asleep ever since she burned young into perimenopause and menopause.  She still has hot flashes she reports wakes up with palpitations and diaphoretic.  She also has chronic  insomnia to initiate sleep.  On her current medication regimen which includes Tylenol PM-Benadryl 2 tablets with 1 tablet of Ambien 5 mg she is mostly able to sleep through the night, but does not initiate sleep promptly at all.  It takes a 2 to 3 hours.    She also acknowledges that she worries a lot she seems to be underlying the anxious without appearing depressed at all.  She is well aware of her blessings in life, she does not have major health problems, her children are doing well and she is financially secure.  Her recent MRI of the brain also returned normal- she did not have a stroke.  We discussed sleep hygiene in detail-  She already has implemented many guidelines- no electronics . No blue light. No sounds in the bedroom and no daylight penetration. Keeps the room cool.  I asked her to take a hot shower before bed and avert all clocks.  We will order an attended sleep study .    Please remember to try to maintain good sleep hygiene,  which [place]: Keep a regular sleep and wake schedule, try not to exercise or have a meal within 2 hours of your bedtime, try to keep your bedroom conducive for sleep, that is, cool and dark, without light distractors such as an illuminated alarm clock, and refrain from watching TV right before sleep or in the middle of the night and do not keep the TV or radio on during the night. Also, try not to use or play on electronic devices at bedtime, such as your cell phone, tablet PC or laptop. If you like to read at bedtime on an electronic device, try to dim the background light as much as possible. Do not eat in the middle of the night.   We will request a sleep study. We will look for leg twitching and snoring or sleep apnea.  For chronic insomnia, you are best followed by a psychiatrist and/or sleep psychologist.  We will call you with the sleep study results and make a follow up appointment if needed.    The patient was advised of the nature of the diagnosed  disorder , the treatment options and the  risks for general health and wellness arising from not treating the condition.   I spent more than 45  minutes of face to face time with the patient.  Greater than 50% of time was spent in counseling and coordination of care. We have discussed the diagnosis and differential and I answered the patient's questions.    Plan:  Treatment plan and additional workup :   Larey Seat, MD 0/06/6225, 3:33 AM  Certified in Neurology by ABPN Certified in Sleep Medicine by [facility] [address], [place], [place] [address]    PSG ordered-  no special montage .

## 2018-05-17 ENCOUNTER — Ambulatory Visit (INDEPENDENT_AMBULATORY_CARE_PROVIDER_SITE_OTHER): Payer: Medicare Other | Admitting: Neurology

## 2018-05-17 DIAGNOSIS — G4761 Periodic limb movement disorder: Secondary | ICD-10-CM | POA: Diagnosis not present

## 2018-05-17 DIAGNOSIS — R61 Generalized hyperhidrosis: Secondary | ICD-10-CM

## 2018-05-17 DIAGNOSIS — F5104 Psychophysiologic insomnia: Secondary | ICD-10-CM

## 2018-05-17 DIAGNOSIS — R002 Palpitations: Secondary | ICD-10-CM

## 2018-05-17 DIAGNOSIS — N951 Menopausal and female climacteric states: Secondary | ICD-10-CM

## 2018-05-20 NOTE — Procedures (Signed)
PATIENT'S NAME:  [name] [name], [name] [name] DOB:      10-26-1951      MR#:    [id number]     DATE OF RECORDING: 05/17/2018 REFERRING M.D.:  Scarlette Calico, M.D. Study Performed:   Baseline Polysomnogram HISTORY:  Anxiety, Asthma, Chronic pain syndrome, Insomnia, Osteoporosis, Stroke, Transient ischemic attack. Menopausal status. Colonic polyps, Hyperlipidemia, Hypertension, Hypothyroidism, Impaired glucose tolerance/ pre-diabetes. Failed trazodone, Ambien, melatonin, and various home remedies to sleep. Onset of insomnia with menopause, racing thoughts, cyclic insomnia- reportedly sometimes no sleep for 2-3 days/ nights. Hot flushes. Good sleep hygiene routines were reported.   The patient endorsed the Epworth Sleepiness Scale at 0/24 points, FSS at 9/24 points, 0 points on depression score.  The patient's weight 142 pounds with a height of 59 (inches), resulting in a BMI of 28.4 kg/m2. The patient's neck circumference measured 14 inches.  CURRENT MEDICATIONS: Tylenol, Lipitor, Lotensin, Plavix, Microzide, Synthroid, Dulera, Singulair, K-dur, Ultram, Ambien, prn Benadryl.   PROCEDURE:  This is a multichannel digital polysomnogram utilizing the Somnostar 11.2 system.  Electrodes and sensors were applied and monitored per AASM Specifications.   EEG, EOG, Chin and Limb EMG, were sampled at 200 Hz.  ECG, Snore and Nasal Pressure, Thermal Airflow, Respiratory Effort, CPAP Flow and Pressure, Oximetry was sampled at 50 Hz. Digital video and audio were recorded.      BASELINE STUDY: Lights Out was at 22:36 and Lights On at 05:01.  Total recording time (TRT) was 386 minutes, with a total sleep time (TST) of 197.5 minutes.   The patient's sleep latency was 187 minutes.  REM latency was 185 minutes.  The sleep efficiency was 51.2 %.     SLEEP ARCHITECTURE: WASO (Wake after sleep onset) was 128.5 minutes.  There were 33.5 minutes in Stage N1, 140.5 minutes Stage N2, 9.5 minutes Stage N3 and 14 minutes in Stage REM.  The  percentage of Stage N1 was 17.%, Stage N2 was 71.1%, Stage N3 was 4.8% and Stage R (REM sleep) was 7.1%.   RESPIRATORY ANALYSIS:  There were a total of 4 respiratory events:  0 apneas and 4 hypopneas with 0 respiratory event related arousals (RERAs). The total APNEA/HYPOPNEA INDEX (AHI) was 1.2/hour and the total RESPIRATORY DISTURBANCE INDEX was 1.2 /hour.  0 events occurred in REM sleep and 8 events in NREM. The REM AHI was 0.0 /hour, versus a non-REM AHI of 1.3. The patient spent 65.5 minutes of total sleep time in the supine position and 132 minutes in non-supine. The supine AHI was 3.7/h versus a non-supine AHI of 0.0/h.  OXYGEN SATURATION & C02:  The Wake baseline 02 saturation was 95%, with the lowest being 80%. Time spent below 89% saturation equaled 8 minutes.    PERIODIC LIMB MOVEMENTS:  The patient had a total of 81 Periodic Limb Movements.  The Periodic Limb Movement (PLM) index was 24.6 and the PLM Arousal index was 16.1/hour.  The arousals were noted as: 43 were spontaneous, 53 were associated with PLMs, and 4 were associated with respiratory events. The spontaneous arousals seemed to correlated with discomfort or dull pain.  Audio and video analysis did not show any abnormal movements, behaviors, phonations or vocalizations.  There were frequent limb movements.  The patient took one bathroom breaks. Mild, non- continuous Snoring was noted. EKG was in keeping with normal sinus rhythm (NSR). Post-study, the patient indicated that sleep was the same as usual.    IMPRESSION: Insomnia is verified by   No evidence of Central  or Obstructive Sleep Apnea (OSA), hypoxemia or even snoring.  1. Severe Periodic Limb Movement Disorder (PLMD) in NREM sleep, contributing to a very long latency to sustained sleep.  RECOMMENDATIONS:  1. Evaluation for RLS/ PLMs will include an iron metabolic study with Ferritin , TIBC, free iron.  2. Back pain can be cause of PLMs in case of spinal stenosis and  also neuropathy. 3. I will meet with the patient to see if we can reduce the PLMs, improve sleep efficiency.   I certify that I have reviewed the entire raw data recording prior to the issuance of this report in accordance with the Standards of Accreditation of the American Academy of Sleep Medicine (AASM)      Larey Seat, MD    05-19-2018  Diplomat, American Board of Psychiatry and Neurology  Diplomat, American Board of [place] Director, [employer] [facility] [employer]

## 2018-05-22 ENCOUNTER — Other Ambulatory Visit: Payer: Self-pay | Admitting: Internal Medicine

## 2018-05-22 DIAGNOSIS — E039 Hypothyroidism, unspecified: Secondary | ICD-10-CM

## 2018-05-23 ENCOUNTER — Telehealth: Payer: Self-pay | Admitting: Neurology

## 2018-05-23 NOTE — Telephone Encounter (Signed)
-----   Message from Larey Seat, MD sent at 05/20/2018 10:15 AM EDT ----- IMPRESSION: Insomnia is verified by: prolonged latency to sustained sleep    No evidence of Central or Obstructive Sleep Apnea (OSA),  hypoxemia or even snoring.  1. Severe Periodic Limb Movement Disorder (PLMD) in NREM sleep,  contributing to a very long latency to sustained sleep.  RECOMMENDATIONS:  1. Evaluation for RLS/ PLMs will include an iron metabolic study  with Ferritin , TIBC, free iron.  2. Back pain can be cause of PLMs in case of spinal stenosis and  also neuropathy. 3. I will meet with the patient to see if we can reduce the PLMs,  improve sleep efficiency.

## 2018-05-23 NOTE — Telephone Encounter (Signed)
Called and reviewed the patient's sleep study with her. Informed her of being negative for sleep apnea. Informed her Dr Brett Fairy did note that she had PLM's. Given her history of stenosis in her back we were unsure if this could be related to that. The patient doesn't feel that it is. I educated on RLS and that Dr Brett Fairy would like to work her up for that. I have scheduled the patient with a follow up apt on July 28 at 8:30 am. Pt verbalized understanding of arriving to check in at 8 am

## 2018-06-13 ENCOUNTER — Telehealth: Payer: Self-pay

## 2018-06-13 ENCOUNTER — Ambulatory Visit (INDEPENDENT_AMBULATORY_CARE_PROVIDER_SITE_OTHER): Payer: Medicare Other | Admitting: Internal Medicine

## 2018-06-13 ENCOUNTER — Encounter: Payer: Self-pay | Admitting: Internal Medicine

## 2018-06-13 VITALS — BP 142/80 | HR 76 | Ht 58.5 in | Wt 138.5 lb

## 2018-06-13 DIAGNOSIS — Z86010 Personal history of colonic polyps: Secondary | ICD-10-CM

## 2018-06-13 DIAGNOSIS — I679 Cerebrovascular disease, unspecified: Secondary | ICD-10-CM | POA: Diagnosis not present

## 2018-06-13 DIAGNOSIS — Z7902 Long term (current) use of antithrombotics/antiplatelets: Secondary | ICD-10-CM

## 2018-06-13 NOTE — Telephone Encounter (Signed)
  RE: [name] [name] DOB: [birth year] MRN: [id number]   Dear Dr [name],    We have scheduled the above patient for an endoscopic procedure. Our records show that she is on anticoagulation therapy.   Please advise as to how long the patient may come off her therapy of Plavix prior to the procedure, which is scheduled for 06/30/18.  Please fax back/ or route the completed form to Kedric Bumgarner [place], [place] at [phone].   Sincerely,    Silvano Rusk, MD, [facility]

## 2018-06-13 NOTE — Progress Notes (Signed)
[name] [name] 67 y.o. [birth year] [id number]  Assessment & Plan:   Encounter Diagnoses  Name Primary?  Marland Kitchen Hx of adenomatous colonic polyps Yes  . Long term current use of antithrombotics/antiplatelets - clopidogrel   . Cerebrovascular disease     Surveillance colonoscopy The risks and benefits as well as alternatives of endoscopic procedure(s) have been discussed and reviewed. All questions answered. The patient agrees to proceed. Extra risk of stroke or vascular event off clopidogrel reviewed.  Hold clopidogrel 5 d  I appreciate the opportunity to care for her IW:PYKD, Hunt Oris, MD    Subjective:   Chief Complaint: hx colon polyps - due for colonoscopy - is on clopidogrel  HPI Hx adenomatous polyps since 2007 - 5 then max 12 mm 2010 3 adenomas max 12 mm 2014 5 mm adenoma  No active sxs at this time BM about qod - stable pattern Allergies  Allergen Reactions  . Fosamax [Alendronate Sodium] Nausea And Vomiting  . Penicillins Hives   Current Meds  Medication Sig  . acetaminophen (TYLENOL) 325 MG tablet Take 650 mg by mouth every 6 (six) hours as needed.    Marland Kitchen atorvastatin (LIPITOR) 40 MG tablet Take 1 tablet (40 mg total) daily by mouth.  . benazepril-hydrochlorthiazide (LOTENSIN HCT) 10-12.5 MG tablet Take 1 tablet by mouth daily.  . clopidogrel (PLAVIX) 75 MG tablet TAKE 1 TABLET BY MOUTH DAILY  . levothyroxine (SYNTHROID, LEVOTHROID) 75 MCG tablet Take 1 tablet (75 mcg total) by mouth daily before breakfast.  . montelukast (SINGULAIR) 10 MG tablet TAKE 1 TABLET(10 MG) BY MOUTH DAILY  . potassium chloride (K-DUR) 10 MEQ tablet Take 2 tablets (20 mEq total) daily by mouth.  . traMADol (ULTRAM) 50 MG tablet Take 1 tablet (50 mg total) by mouth every 8 (eight) hours as needed.  . zolpidem (AMBIEN) 10 MG tablet Take 1 tablet (10 mg total) by mouth at bedtime as needed for sleep.   Past Medical History:  Diagnosis Date  . ALCOHOL ABUSE, HX OF 11/06/2007  . ALLERGIC  RHINITIS 11/06/2007  . Anxiety 04/03/2011  . ASTHMA 09/13/2007  . Chronic pain syndrome 12/15/2016  . COLONIC POLYPS, HX OF 02/09/2008    ADENOMATOUS POLYP  . Encounter for well adult exam without abnormal findings 04/03/2011  . HYPERLIPIDEMIA 11/03/2010  . HYPERTENSION 09/13/2007  . HYPOTHYROIDISM 11/06/2007  . Impaired glucose tolerance 07/30/2013  . INSOMNIA, HX OF 09/13/2007  . Lumbar degenerative disc disease 12/15/2016  . OSTEOPOROSIS 11/06/2007  . PERIMENOPAUSAL STATUS 09/13/2007  . RLS (restless legs syndrome)   . Stroke ([place])   . TIA (transient ischemic attack) 11/04/2011   Past Surgical History:  Procedure Laterality Date  . BREAST EXCISIONAL BIOPSY     left  . BREAST SURGERY  2008 and 2012   x 2 - benign, left side  . carotid artery surgery Left   . CESAREAN SECTION     x 3  . 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   Review of Systems + insomnia, Otherwise negative ROS  Objective:   Physical  Exam BP (!) 142/80 (BP Location: Left Arm, Patient Position: Sitting, Cuff Size: Normal)   Pulse 76   Ht 4' 10.5" (1.486 m)   Wt 138 lb 8 oz (62.8 kg)   BMI 28.45 kg/m  NAD Eyes anicteric Lungs cta Cor s1s2 no rmg A and o x 3

## 2018-06-13 NOTE — Patient Instructions (Addendum)
  You have been scheduled for a colonoscopy. Please follow written instructions given to you at your visit today.  Please pick up your prep supplies at the pharmacy within the next 1-3 days. If you use inhalers (even only as needed), please bring them with you on the day of your procedure. Your physician has requested that you go to www.startemmi.com and enter the access code given to you at your visit today. This web site gives a general overview about your procedure. However, you should still follow specific instructions given to you by our office regarding your preparation for the procedure.  You may have a light breakfast the day before your procedure. Follow the instruction sheet given.   You will be contacted by our office prior to your procedure for directions on holding your Plavix.  If you do not hear from our office 1 week prior to your scheduled procedure, please call [phone] to discuss.     I appreciate the opportunity to care for you. Silvano Rusk, MD, [facility]

## 2018-06-13 NOTE — Telephone Encounter (Signed)
OK for off Plavix x 5 days prior to procedure, thanks

## 2018-06-13 NOTE — Telephone Encounter (Signed)
Patient informed and verbalized understanding. She wrote it down.

## 2018-06-16 ENCOUNTER — Other Ambulatory Visit: Payer: Self-pay | Admitting: Internal Medicine

## 2018-06-20 ENCOUNTER — Encounter: Payer: Self-pay | Admitting: Internal Medicine

## 2018-06-23 ENCOUNTER — Other Ambulatory Visit: Payer: Self-pay | Admitting: Internal Medicine

## 2018-06-23 DIAGNOSIS — Z1231 Encounter for screening mammogram for malignant neoplasm of breast: Secondary | ICD-10-CM

## 2018-06-27 ENCOUNTER — Encounter: Payer: Self-pay | Admitting: Neurology

## 2018-06-27 ENCOUNTER — Ambulatory Visit: Payer: Medicare Other | Admitting: Neurology

## 2018-06-27 VITALS — BP 150/84 | HR 67 | Ht 59.0 in | Wt 139.0 lb

## 2018-06-27 DIAGNOSIS — G4700 Insomnia, unspecified: Secondary | ICD-10-CM | POA: Diagnosis not present

## 2018-06-27 DIAGNOSIS — G4761 Periodic limb movement disorder: Secondary | ICD-10-CM | POA: Diagnosis not present

## 2018-06-27 DIAGNOSIS — D508 Other iron deficiency anemias: Secondary | ICD-10-CM | POA: Diagnosis not present

## 2018-06-27 MED ORDER — ROPINIROLE HCL 0.25 MG PO TABS: 0.2500 mg | ORAL_TABLET | Freq: Every day | ORAL | 2 refills | Status: DC

## 2018-06-27 NOTE — Addendum Note (Signed)
Addended by: Larey Seat on: 06/27/2018 03:49 PM   Modules accepted: Orders

## 2018-06-27 NOTE — Patient Instructions (Signed)

## 2018-06-27 NOTE — Progress Notes (Signed)
SLEEP MEDICINE CLINIC   Provider:  Larey Seat, M D  Primary Care Physician:  [name] Borg, MD   Referring Provider: Biagio Borg, MD    Chief Complaint  Patient presents with  . Follow-up    pt alone, rm 11. pt presents to discuss sleep study results and treatment for the restlessness in her extremities. pt still struggles with sleeping.     HPI: I have the pleasure of meeting [name] today, a 67 year old African-American right-handed lady that I had seen in a consultation earlier this year.  Today is 27 June 2018 and we are looking at the baseline polysomnography report.  The patient was evaluated on 17 May 2018 her sleep study showed no significant apnea her AHI was 1.2, the same was true for the respiratory disturbance index which indicates that she also does not snore.  She did not have oxygen desaturations, she did however have frequent leg movements and kicking.  Some of these aroused her not to full awareness but from deep sleep stages into lighter or drowsiness stages.  Most arousals were spontaneous.  Her periodic limb movements also manifest as restless legs contributing to a very long latency before she entered sustain sleep.  Ferritin, TSH and free iron will be obtained, and I confirmed that the patient is not diabetic, she takes synthroid.      [name] [name] is a 67 y.o. female , seen here as in a referral  from Dr. Marcello Moores [name]/ [name] [name] for a sleep disorder evaluation.   Chief complaint according to patient : " Ever since I went through menopause and cannot sleep, I wondered why" I cannot shut my brain off, and I stay busy all day - I cannot nap in daytime either" .  Sometimes she stays awake for 2-3 days ... Dr [name] [name] prescribed Ambien.  I have the pleasure of meeting [name] today on 25 Apr 2018, she is a 67 year old African-American female and recently retired Radio producer.  Since her retirement she keeps her to toddler grandchildren  in daytime, keeps very active and still feels she cannot sleep well sometimes at night not at all.  She presented to her primary care physician's office in March with headaches, as found to have low BP.  Dr. Ronnald [name] asked her to stop 1 of her blood pressure medicines, she was on a combination of a calcium channel blocker and ARB.  Her blood pressure finally came back up but 2 days later it was back down again.  She did not feel really dizzy or lightheaded but the headache was her main symptom.  She had some tingling in her right upper extremity and reportedly has a history of prior TIAs.  She remains on Plavix, Lipitor, hydrochlorothiazide, on an albuterol inhaler for asthma as she claims, she is on Dulera, Singulair, potassium supplement, she takes tramadol-Ultram as needed for pain, Allegra, she takes Ambien 5 mg at night, Synthroid.  On 18 March of this year her blood pressure had rebounded.  She had no tenderness over the temporal arteries, she has a high TSH, higher than normal fasting glucose, her neurologic exam was nonfocal at the time.   An MRI of the brain without contrast was ordered to evaluate the tingling and numbness of the right arm, and Dr. Jenny [name] also ordered Belsomra 50 mg tablets as a sleep aid. She took those for 12 days and they did not work. She couldn't tolerate trazodone she stated.  Sleep habits are as follows: she is aiming for a bedtime of 11 Pm, and for the last hour of the day she is reading in her bible- and praying. No electronics , lights are dim.  She describes her bedroom as conducive to sleep, there are no distracting lights, sounds it is cool, quiet and dark.  She sleeps alone in her bed. She does not have a preferred sleep position, she sleeps on only one flat but firm pillow.  Her bed is not adjustable. She takes 2 Tylenol PM with Benadryl, and Ambien 5 mg between 8 and 9 PM and still struggles to go to bed and sleep before midnight. She does not have to go to the  bathroom at night, her mother lives with her and has not noted her to snore.  The patient herself is not sure if she snores or not or if she may have apnea or not.  She still suffers from severe hot flashes even at night, and sometimes wakes up with palpitations and drenched in sweat.  She does not recall vivid dreams but she used to have 10 or 20 years ago. She rises at 8 AM to take care of her grand children. Her daughter works at [employer] as a Database administrator and is  in NP school. She picks her children up at 7 or 8 PM.    Sleep medical history and family sleep history:  Menopausal, mind racing. Insomnia.Since age 54 .  Social history:  67 year old african [place] female patient who [place].  Quit smoking 25 years ago and quit drinking 25 years ago. Caffeine : she drinks a pepsi in AM, and never past 6 Pm, no iced or hot tea, no coffee, no energy drinks. 30 years ago worked night shifts - and had early, early perimenopause at that time- she "never recovered" , never used HRT. Exercise- walking 6 miles a day 3 days a week.   Divorced - but has a civil relationship with her ex spouse. Mother of 3 sucessfull children, living with her mother. 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    Review of Systems: Out of a complete 14 system review, the patient complains of only the following symptoms, and all other reviewed systems are negative. Status post thyroid radiation treatment.   Racing thoughts. Worries about things she knows she cannot change. Anxious - but has no reason to.   Epworth score: 0/ 24  , Fatigue severity score 9/24   , depression score 0/15 .    Social History   Socioeconomic History  . Marital status: Divorced    Spouse name: Not on file  . Number of children: 3  . Years of education: Not on file  . Highest education level: Not on file  Occupational History  . Occupation: Nurse, mental health: [employer]  Social Needs  . Financial  resource strain: Not on file  . Food insecurity:    Worry: Not on file    Inability: Not on file  . Transportation needs:    Medical: Not on file    Non-medical: Not on file  Tobacco Use  . Smoking status: Never Smoker  . Smokeless tobacco: Never Used  Substance and Sexual Activity  . Alcohol use: No  . Drug use: No  . Sexual activity: Not on file  Lifestyle  . Physical activity:    Days per week: Not on file    Minutes  per session: Not on file  . Stress: Not on file  Relationships  . Social connections:    Talks on phone: Not on file    Gets together: Not on file    Attends religious service: Not on file    Active member of club or organization: Not on file    Attends meetings of clubs or organizations: Not on file    Relationship status: Not on file  . Intimate partner violence:    Fear of current or ex partner: Not on file    Emotionally abused: Not on file    Physically abused: Not on file    Forced sexual activity: Not on file  Other Topics Concern  . 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 Hyperlipidemia Other   . Hypertension Other   . Coronary artery disease Other   . Breast cancer Cousin   . Hypertension Mother   . Heart disease Mother   . Dementia Maternal Grandmother   . Colon cancer Neg Hx     Past Medical History:  Diagnosis Date  . ALCOHOL ABUSE, HX OF 11/06/2007  . ALLERGIC RHINITIS 11/06/2007  . Anxiety 04/03/2011  . ASTHMA 09/13/2007  . Chronic pain syndrome 12/15/2016  . COLONIC POLYPS, HX OF 02/09/2008    ADENOMATOUS POLYP  . Encounter for well adult exam without abnormal findings 04/03/2011  . HYPERLIPIDEMIA 11/03/2010  . HYPERTENSION 09/13/2007  . HYPOTHYROIDISM 11/06/2007  . Impaired glucose tolerance 07/30/2013  . INSOMNIA, HX OF 09/13/2007  .  Lumbar degenerative disc disease 12/15/2016  . OSTEOPOROSIS 11/06/2007  . PERIMENOPAUSAL STATUS 09/13/2007  . RLS (restless legs syndrome)   . Stroke ([place])   . TIA (transient ischemic attack) 11/04/2011    Past Surgical History:  Procedure Laterality Date  . BREAST EXCISIONAL BIOPSY     left  . BREAST SURGERY  2008 and 2012   x 2 - benign, left side  . carotid artery surgery Left   . CESAREAN SECTION     x 3  . COLONOSCOPY  multiple   2010    Current Outpatient Medications  Medication Sig Dispense Refill  . acetaminophen (TYLENOL) 325 MG tablet Take 650 mg by mouth every 6 (six) hours as needed.      Marland Kitchen atorvastatin (LIPITOR) 40 MG tablet Take 1 tablet (40 mg total) daily by mouth. 90 tablet 3  . benazepril-hydrochlorthiazide (LOTENSIN HCT) 10-12.5 MG tablet Take 1 tablet by mouth daily. 90 tablet 3  . clopidogrel (PLAVIX) 75 MG tablet TAJE 1 TABLET BY MOUTH DAILY. 90 tablet 1  . levothyroxine (SYNTHROID, LEVOTHROID) 75 MCG tablet Take 1 tablet (75 mcg total) by mouth daily before breakfast. 90 tablet 3  . montelukast (SINGULAIR) 10 MG tablet TAKE 1 TABLET(10 MG) BY MOUTH DAILY 30 tablet 11  . potassium chloride (MICRO-K) 10 MEQ CR capsule TAKE 1 TABLET BY MOUTH DAILY. 90 capsule 1  . traMADol (ULTRAM) 50 MG tablet Take 1 tablet (50 mg total) by mouth every 8 (eight) hours as needed. 90 tablet 5  . zolpidem (AMBIEN) 10 MG tablet Take 1 tablet (10 mg total) by mouth at bedtime as needed for sleep. 90 tablet 1   No current facility-administered medications for this visit.  Allergies as of 06/27/2018 - Review Complete 06/27/2018  Allergen Reaction Noted  . Fosamax [alendronate sodium] Nausea And Vomiting 03/13/2011  . Penicillins Hives     Vitals: BP (!) 150/84   Pulse 67   Ht 4\' 11"  (1.499 m)   Wt 139 lb (63 kg)   BMI 28.07 kg/m  Last Weight:  Wt Readings from Last 1 Encounters:  06/27/18 139 lb (63 kg)   ALP:FXTK mass index is 28.07 kg/m.     Last Height:   Ht  Readings from Last 1 Encounters:  06/27/18 4\' 11"  (1.499 m)    Physical exam:  General: The patient is awake, alert and appears not in acute distress. The patient is well groomed. Head: Normocephalic, atraumatic. Neck is supple. Mallampati 5 ,  neck circumference:14. Nasal airflow patent ,  Mild retrognathia is seen.  Cardiovascular:  Regular rate and rhythm , without  murmurs or carotid bruit, and without distended neck veins. Respiratory: Lungs are clear to auscultation.( She reports she had wheezing in March  ) . Skin:  Without evidence of edema, or rash Trunk: BMI is 28.7. The patient's posture is erect  Neurologic exam : The patient is awake and alert, oriented to place and time.     Attention span & concentration ability appears normal.  Speech is fluent,  Without dysarthria, dysphonia or aphasia.  Mood and affect are appropriate.  Cranial nerves: Pupils are equal in shape and size and briskly reactive to light. Funduscopic exam without evidence of pallor or edema. Extraocular movements  in vertical and horizontal planes intact and without nystagmus. Visual fields by finger perimetry are intact.Hearing to finger rub intact.   Facial sensation intact to fine touch. Facial motor strength is symmetric and tongue and uvula move midline. Shoulder shrug was symmetrical.   Motor exam:   Normal tone, muscle bulk and symmetric strength .Sensory:  Fine touch, pinprick and vibration were normal .  Coordination: Rapid alternating movements in the fingers/hands and  Finger-to-nose maneuver were  normal - without evidence of ataxia, dysmetria or tremor. Gait and station: Patient walks without assistive device.  Deep tendon reflexes: in the  upper and lower extremities are symmetric  .  Assessment:  After physical and neurologic examination, review of laboratory studies,  Personal review of imaging studies, reports of other /same  Imaging studies, results of polysomnography:  1) Mrs. Guardado has  no evidence of apnea. There were PLM related arousals and many spontaneous arousals.  2) will obtain iron studies and start on low dose Requip.   3)Insomnia has mildly improved -Her challenge is that she has trouble to fall asleep ever since she burned young into perimenopause and menopause.  She still has hot flashes she reports wakes up with palpitations and diaphoretic.  She also has chronic insomnia to initiate sleep.  We had discussed the sleep hygiene tips to help with insomnia.  She has implemented these.   Rv with Np in 3 month, as needed after that.      Larey Seat, MD 2/40/9735, 3:29 PM  Certified in Neurology by ABPN Certified in Sleep Medicine by [name] [address], [place], [place] [address]    PSG ordered-  no special montage .

## 2018-06-28 ENCOUNTER — Telehealth: Payer: Self-pay | Admitting: Neurology

## 2018-06-28 LAB — IRON,TIBC AND FERRITIN PANEL
FERRITIN: 216 ng/mL — AB (ref 15–150)
IRON SATURATION: 31 % (ref 15–55)
IRON: 76 ug/dL (ref 27–139)
Total Iron Binding Capacity: 248 ug/dL — ABNORMAL LOW (ref 250–450)
UIBC: 172 ug/dL (ref 118–369)

## 2018-06-28 NOTE — Telephone Encounter (Signed)
Called the patient to review the lab work. No answer. LVM for the pt to call back  Please inform pt when calling back that her iron levels were normal. Dr Brett Fairy doesn't feel that her restless leg is due to low iron.

## 2018-06-28 NOTE — Telephone Encounter (Signed)
-----   Message from Larey Seat, MD sent at 06/28/2018  5:20 PM EDT ----- Sufficient iron saturation, high ferritin level. RLS/PLMs unrelated to iron deficiency.

## 2018-06-29 NOTE — Telephone Encounter (Signed)
Called the patient and reviewed her lab work with her informing her that the iron levels were in a good range and wasn't the cause for the RLS. Pt wanted to let Dr Brett Fairy know that she took her first dose of the medication last night along with her Lorrin Mais and she states that was the worse night of sleep she had in a long time. She states that she was unable to go to sleep until 4 am. I advised I would make Dr Brett Fairy aware of this and see if she would like for the patient to give it a few more tries before giving up. Pt verbalized understanding. Informed her I would discuss with Dr Brett Fairy and call her with her thoughts and recommendations.

## 2018-06-30 ENCOUNTER — Ambulatory Visit (AMBULATORY_SURGERY_CENTER): Payer: Medicare Other | Admitting: Internal Medicine

## 2018-06-30 ENCOUNTER — Encounter: Payer: Self-pay | Admitting: Internal Medicine

## 2018-06-30 ENCOUNTER — Ambulatory Visit: Payer: Self-pay | Admitting: Neurology

## 2018-06-30 ENCOUNTER — Other Ambulatory Visit: Payer: Self-pay

## 2018-06-30 VITALS — BP 101/50 | HR 67 | Temp 98.2°F | Resp 19 | Ht 59.0 in | Wt 154.0 lb

## 2018-06-30 DIAGNOSIS — D123 Benign neoplasm of transverse colon: Secondary | ICD-10-CM | POA: Diagnosis not present

## 2018-06-30 DIAGNOSIS — Z86010 Personal history of colonic polyps: Secondary | ICD-10-CM | POA: Diagnosis present

## 2018-06-30 DIAGNOSIS — D125 Benign neoplasm of sigmoid colon: Secondary | ICD-10-CM | POA: Diagnosis not present

## 2018-06-30 MED ORDER — SODIUM CHLORIDE 0.9 % IV SOLN: 500.0000 mL | Freq: Once | INTRAVENOUS | Status: DC

## 2018-06-30 NOTE — Progress Notes (Signed)
Pt's states no medical or surgical changes since previsit or office visit. 

## 2018-06-30 NOTE — Progress Notes (Signed)
Called to room to assist during endoscopic procedure.  Patient ID and intended procedure confirmed with present staff. Received instructions for my participation in the procedure from the performing physician.  

## 2018-06-30 NOTE — Patient Instructions (Addendum)
   I removed 2 polyps today. I will let you know pathology results and when to have another routine colonoscopy by mail and/or My Chart. Anticipate 5 years.  Please restart clopidogrel tomorrow.  I appreciate the opportunity to care for you. Gatha Mayer, MD, FACG   YOU HAD AN ENDOSCOPIC PROCEDURE TODAY AT [facility]:   Refer to the procedure report that was given to you for any specific questions about what was found during the examination.  If the procedure report does not answer your questions, please call your gastroenterologist to clarify.  If you requested that your care partner not be given the details of your procedure findings, then the procedure report has been included in a sealed envelope for you to review at your convenience later.  YOU SHOULD EXPECT: Some feelings of bloating in the abdomen. Passage of more gas than usual.  Walking can help get rid of the air that was put into your GI tract during the procedure and reduce the bloating. If you had a lower endoscopy (such as a colonoscopy or flexible sigmoidoscopy) you may notice spotting of blood in your stool or on the toilet paper. If you underwent a bowel prep for your procedure, you may not have a normal bowel movement for a few days.  Please Note:  You might notice some irritation and congestion in your nose or some drainage.  This is from the oxygen used during your procedure.  There is no need for concern and it should clear up in a day or so.  SYMPTOMS TO REPORT IMMEDIATELY:   Following lower endoscopy (colonoscopy or flexible sigmoidoscopy):  Excessive amounts of blood in the stool  Significant tenderness or worsening of abdominal pains  Swelling of the abdomen that is new, acute  Fever of 100F or higher   For urgent or emergent issues, a gastroenterologist can be reached at any hour by calling [phone].   DIET:  We do recommend a small meal at first, but then you may proceed to  your regular diet.  Drink plenty of fluids but you should avoid alcoholic beverages for 24 hours.  ACTIVITY:  You should plan to take it easy for the rest of today and you should NOT DRIVE or use heavy machinery until tomorrow (because of the sedation medicines used during the test).    FOLLOW UP: Our staff will call the number listed on your records the next business day following your procedure to check on you and address any questions or concerns that you may have regarding the information given to you following your procedure. If we do not reach you, we will leave a message.  However, if you are feeling well and you are not experiencing any problems, there is no need to return our call.  We will assume that you have returned to your regular daily activities without incident.  If any biopsies were taken you will be contacted by phone or by letter within the next 1-3 weeks.  Please call us at [phone] if you have not heard about the biopsies in 3 weeks.    SIGNATURES/CONFIDENTIALITY: You and/or your care partner have signed paperwork which will be entered into your electronic medical record.  These signatures attest to the fact that that the information above on your After Visit Summary has been reviewed and is understood.  Full responsibility of the confidentiality of this discharge information lies with you and/or your care-partner.

## 2018-06-30 NOTE — Progress Notes (Signed)
To PACU, VSS. Report to [email or web address] 

## 2018-06-30 NOTE — Telephone Encounter (Signed)
Called the patient back she states the 2nd night taking the medication went well. So for now the patient will continue to take the medication and see if it helps. I did inform her that Dr Brett Fairy said she can stop taking it if she finds it is not beneficial. Pt verbalized understanding.

## 2018-06-30 NOTE — Progress Notes (Signed)
The chart prep stated one diagnosis, and it was not correct.  [name]'s why two were entered into the computer. I needed to correct it.

## 2018-06-30 NOTE — Op Note (Signed)
[place] Patient Name: [name] [name] Procedure Date: 06/30/2018 2:27 PM MRN: [id number] Endoscopist: Gatha Mayer , MD Age: 67 Referring MD:  Date of Birth: 1951/05/23 Gender: Female Account #: [id number] Procedure:                Colonoscopy Indications:              Surveillance: Personal history of adenomatous                            polyps on last colonoscopy 5 years ago Medicines:                Propofol per Anesthesia, Monitored Anesthesia Care Procedure:                Pre-Anesthesia Assessment:                           - Prior to the procedure, a History and Physical                            was performed, and patient medications and                            allergies were reviewed. The patient's tolerance of                            previous anesthesia was also reviewed. The risks                            and benefits of the procedure and the sedation                            options and risks were discussed with the patient.                            All questions were answered, and informed consent                            was obtained. Prior Anticoagulants: The patient                            last took Plavix (clopidogrel) 5 days prior to the                            procedure. ASA Grade Assessment: III - A patient                            with severe systemic disease. After reviewing the                            risks and benefits, the patient was deemed in                            satisfactory condition to undergo the procedure.  After obtaining informed consent, the colonoscope                            was passed under direct vision. Throughout the                            procedure, the patient's blood pressure, pulse, and                            oxygen saturations were monitored continuously. The                            Colonoscope was introduced through the anus and     advanced to the the cecum, identified by                            appendiceal orifice and ileocecal valve. The                            colonoscopy was performed without difficulty. The                            patient tolerated the procedure well. The quality                            of the bowel preparation was excellent. The                            ileocecal valve, appendiceal orifice, and rectum                            were photographed. The bowel preparation used was                            Miralax. Scope In: 2:36:01 PM Scope Out: 2:52:35 PM Scope Withdrawal Time: 0 hours 13 minutes 53 seconds  Total Procedure Duration: 0 hours 16 minutes 34 seconds  Findings:                 The perianal and digital rectal examinations were                            normal.                           A 8 mm polyp was found in the sigmoid colon. The                            polyp was semi-pedunculated. The polyp was removed                            with a cold snare. Resection and retrieval were                            complete. Verification of patient identification  for the specimen was done. Estimated blood loss was                            minimal.                           A 1 mm polyp was found in the transverse colon. The                            polyp was sessile. The polyp was removed with a                            cold biopsy forceps. Resection and retrieval were                            complete. Verification of patient identification                            for the specimen was done. Estimated blood loss was                            minimal.                           Scattered diverticula were found in the right colon.                           The exam was otherwise without abnormality on                            direct and retroflexion views. Complications:            No immediate complications. Estimated Blood Loss:      Estimated blood loss was minimal. Impression:               - One 8 mm polyp in the sigmoid colon, removed with                            a cold snare. Resected and retrieved.                           - One 1 mm polyp in the transverse colon, removed                            with a cold biopsy forceps. Resected and retrieved.                           - Diverticulosis in the right colon.                           - The examination was otherwise normal on direct                            and retroflexion views.                           -  Personal history of colonic polyps. Adenomas Recommendation:           - Patient has a contact number available for                            emergencies. The signs and symptoms of potential                            delayed complications were discussed with the                            patient. Return to normal activities tomorrow.                            Written discharge instructions were provided to the                            patient.                           - Resume previous diet.                           - Continue present medications.                           - Repeat colonoscopy is recommended for                            surveillance. The colonoscopy date will be                            determined after pathology results from today's                            exam become available for review.                           - Resume Plavix (clopidogrel) at prior dose                            tomorrow. Gatha Mayer, MD 06/30/2018 3:01:27 PM This report has been signed electronically.

## 2018-07-04 ENCOUNTER — Telehealth: Payer: Self-pay | Admitting: *Deleted

## 2018-07-04 ENCOUNTER — Telehealth: Payer: Self-pay

## 2018-07-04 NOTE — Telephone Encounter (Signed)
Follow up call attempt #1.  Message left at home number that had phone number identifier.

## 2018-07-04 NOTE — Telephone Encounter (Signed)
Attempted to reach patient for post-procedure f/u call. No answer. Left message for her to please not hesitate to call us if she has any questions/concerns regarding her care. 

## 2018-07-06 ENCOUNTER — Encounter: Payer: Self-pay | Admitting: Internal Medicine

## 2018-07-06 DIAGNOSIS — Z86010 Personal history of colonic polyps: Secondary | ICD-10-CM

## 2018-07-06 NOTE — Progress Notes (Signed)
2 adenomas Recall 2024 My Chart

## 2018-08-04 ENCOUNTER — Ambulatory Visit
Admission: RE | Admit: 2018-08-04 | Discharge: 2018-08-04 | Disposition: A | Discharge status: Home / Self Care | Payer: Medicare Other | Source: Ambulatory Visit | Attending: Internal Medicine | Admitting: Internal Medicine

## 2018-08-04 DIAGNOSIS — Z1231 Encounter for screening mammogram for malignant neoplasm of breast: Secondary | ICD-10-CM

## 2018-09-20 ENCOUNTER — Other Ambulatory Visit: Payer: Self-pay | Admitting: Neurology

## 2018-09-24 ENCOUNTER — Other Ambulatory Visit: Payer: Self-pay | Admitting: Internal Medicine

## 2018-09-26 NOTE — Telephone Encounter (Signed)
Done erx 

## 2018-09-27 ENCOUNTER — Ambulatory Visit: Payer: Medicare Other | Admitting: Neurology

## 2018-09-27 ENCOUNTER — Encounter: Payer: Self-pay | Admitting: Neurology

## 2018-09-27 VITALS — BP 142/75 | Ht 59.0 in | Wt 137.0 lb

## 2018-09-27 DIAGNOSIS — G2581 Restless legs syndrome: Secondary | ICD-10-CM

## 2018-09-27 DIAGNOSIS — G4701 Insomnia due to medical condition: Secondary | ICD-10-CM

## 2018-09-27 NOTE — Progress Notes (Signed)
[facility]   Provider:  Larey [name], M D  Primary Care Physician:  [name] Borg, MD   Referring Provider: Biagio Borg, MD    Chief Complaint  Patient presents with  . Follow-up    pt with granddaughter, rm 23. pt here following up. she states that she is sleeping better, she lost her mother just recently,     HPI: I have the pleasure of meeting [name] today, a 67 year old African-American right-handed lady that I had seen in a consultation earlier this year.  Today is 27 June 2018 and we are looking at the baseline polysomnography report.  The patient was evaluated on 17 May 2018 her sleep study showed no significant apnea her AHI was 1.2, the same was true for the respiratory disturbance index which indicates that she also does not snore.  She did not have oxygen desaturations, she did however have frequent leg movements and kicking.  Some of these aroused her not to full awareness but from deep sleep stages into lighter or drowsiness stages.  Most arousals were spontaneous.  Her periodic limb movements also manifest as restless legs contributing to a very long latency before she entered sustain sleep. Ferritin, TSH and free iron will be obtained, and I confirmed that the patient is not diabetic, she takes synthroid.     [name] [name] is a 67 y.o. female , seen here as in a referral  from [name]. Marcello Moores [name]/ [name] [name] for a sleep disorder evaluation.  Chief complaint according to patient : " Ever since I went through menopause and cannot sleep, I wondered why" I cannot shut my brain off, and I stay busy all day - I cannot nap in daytime either" .  Sometimes she stays awake for 2-3 days ... [name] [name] prescribed Ambien.  I have the pleasure of meeting [name] today on 25 Apr 2018, she is a 67 year old African-American female and recently retired Radio producer.  Since her retirement she keeps her to toddler grandchildren in daytime, keeps very  active and still feels she cannot sleep well sometimes at night not at all.  She presented to her primary care physician's office in March with headaches, as found to have low BP.  [name]. Ronnald [name] asked her to stop 1 of her blood pressure medicines, she was on a combination of a calcium channel blocker and ARB.  Her blood pressure finally came back up but 2 days later it was back down again.  She did not feel really dizzy or lightheaded but the headache was her main symptom.  She had some tingling in her right upper extremity and reportedly has a history of prior TIAs.  She remains on Plavix, Lipitor, hydrochlorothiazide, on an albuterol inhaler for asthma as she claims, she is on Dulera, Singulair, potassium supplement, she takes tramadol-Ultram as needed for pain, Allegra, she takes Ambien 5 mg at night, Synthroid.  On 18 March of this year her blood pressure had rebounded.  She had no tenderness over the temporal arteries, she has a high TSH, higher than normal fasting glucose, her neurologic exam was nonfocal at the time.   An MRI of the brain without contrast was ordered to evaluate the tingling and numbness of the right arm, and [name]. Jenny [name] also ordered Belsomra 50 mg tablets as a sleep aid. She took those for 12 days and they did not work. She couldn't tolerate trazodone she stated.   09-27-2018,  RV with CD-  [name] [name] , a 15 year old great grandmother is seen here after she received treatment for PLMD and restless legs, her feet are not longer as tender. She sleeps better-- and feels rested. She takes a tylenol Pm around 7.30- and at 10 she takes Ambien and is out. Her mind is racing if she doesn't take the medication- but she has learnt to keep a better sleep hygiene.    Sleep habits are as follows: she is aiming for a bedtime of 11 Pm, and for the last hour of the day she is reading in her bible- and praying. No electronics , lights are dim. She describes her bedroom as conducive to sleep, there are no  distracting lights, sounds it is cool, quiet and dark.  She sleeps alone in her bed. She does not have a preferred sleep position, she sleeps on only one flat but firm pillow.  Her bed is not adjustable. She takes 2 Tylenol PM with Benadryl, and Ambien 5 mg between 8 and 9 PM and still struggles to go to bed and sleep before midnight. She does not have to go to the bathroom at night, her mother lives with her and has not noted her to snore.  The patient herself is not sure if she snores or not or if she may have apnea or not.  She still suffers from severe hot flashes even at night, and sometimes wakes up with palpitations and drenched in sweat.  She does not recall vivid dreams but she used to have 10 or 20 years ago. She rises at 8 AM to take care of her grand children. Her daughter works at [employer] as a Database administrator and is  in NP school. She picks her children up at 7 or 8 PM.    Sleep medical history and family sleep history:  Menopausal, mind racing. Insomnia.Since age 45 .  Social history:  67 year old african [place] female patient [place].  Quit smoking 25 years ago and quit drinking 25 years ago. Caffeine : she drinks a pepsi in AM, and never past 6 Pm, no iced or hot tea, no coffee, no energy drinks. 30 years ago worked night shifts - and had early, early perimenopause at that time- she "never recovered" , never used HRT. Exercise- walking 6 miles a day 3 days a week.   Divorced - but has a civil relationship with her ex spouse. Mother of 3 sucessfull children, living with her mother. 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    Review of Systems: Out of a complete 14 system review, the patient complains of only the following symptoms, and all other reviewed systems are negative. Status post thyroid radiation treatment.  Racing thoughts. Worries about things she knows she cannot change. Anxious - but has no reason to.  Again, the Epworth score is endorsed at : 0/ 24 ,  Fatigue severity score 9/24 points  Social History   Socioeconomic History  . Marital status: Divorced    Spouse name: Not on file  . Number of children: 3  . Years of education: Not on file  . Highest education level: Not on file  Occupational History  . Occupation: Nurse, mental health: [employer]  Social Needs  . Financial resource strain: Not on file  . Food insecurity:    Worry: Not on file    Inability: Not on file  . Transportation needs:  Medical: Not on file    Non-medical: Not on file  Tobacco Use  . Smoking status: Never Smoker  . Smokeless tobacco: Never Used  Substance and Sexual Activity  . Alcohol use: No  . Drug use: No  . Sexual activity: Not on file  Lifestyle  . Physical activity:    Days per week: Not on file    Minutes per session: Not on file  . Stress: Not on file  Relationships  . Social connections:    Talks on phone: Not on file    Gets together: Not on file    Attends religious service: Not on file    Active member of club or organization: Not on file    Attends meetings of clubs or organizations: Not on file    Relationship status: Not on file  . Intimate partner violence:    Fear of current or ex partner: Not on file    Emotionally abused: Not on file    Physically abused: Not on file    Forced sexual activity: Not on file  Other Topics Concern  . 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 Hyperlipidemia Other   . Hypertension Other   . Coronary artery disease Other   . Breast cancer Cousin   . Hypertension Mother   . Heart disease Mother   . Dementia Maternal Grandmother   . Colon cancer Neg Hx     Past Medical History:  Diagnosis Date  . ALCOHOL ABUSE, HX OF 11/06/2007  . ALLERGIC RHINITIS 11/06/2007  .  Anxiety 04/03/2011  . ASTHMA 09/13/2007  . Chronic pain syndrome 12/15/2016  . COLONIC POLYPS, HX OF 02/09/2008    ADENOMATOUS POLYP  . Encounter for well adult exam without abnormal findings 04/03/2011  . HYPERLIPIDEMIA 11/03/2010  . HYPERTENSION 09/13/2007  . HYPOTHYROIDISM 11/06/2007  . Impaired glucose tolerance 07/30/2013  . INSOMNIA, HX OF 09/13/2007  . Lumbar degenerative disc disease 12/15/2016  . OSTEOPOROSIS 11/06/2007  . PERIMENOPAUSAL STATUS 09/13/2007  . RLS (restless legs syndrome)   . Stroke ([place])   . TIA (transient ischemic attack) 11/04/2011    Past Surgical History:  Procedure Laterality Date  . BREAST EXCISIONAL BIOPSY     left  . BREAST SURGERY  2008 and 2012   x 2 - benign, left side  . carotid artery surgery Left   . CESAREAN SECTION     x 3  . COLONOSCOPY  multiple   2010    Current Outpatient Medications  Medication Sig Dispense Refill  . acetaminophen (TYLENOL) 325 MG tablet Take 650 mg by mouth every 6 (six) hours as needed.      Marland Kitchen atorvastatin (LIPITOR) 40 MG tablet Take 1 tablet (40 mg total) daily by mouth. 90 tablet 3  . benazepril-hydrochlorthiazide (LOTENSIN HCT) 10-12.5 MG tablet Take 1 tablet by mouth daily. 90 tablet 3  . clopidogrel (PLAVIX) 75 MG tablet TAJE 1 TABLET BY MOUTH DAILY. 90 tablet 1  . levothyroxine (SYNTHROID, LEVOTHROID) 75 MCG tablet Take 1 tablet (75 mcg total) by mouth daily before breakfast. 90 tablet 3  . montelukast (SINGULAIR) 10 MG tablet TAKE 1 TABLET(10 MG) BY MOUTH DAILY 30 tablet 11  . potassium chloride (MICRO-K) 10 MEQ CR capsule TAKE 1 TABLET  BY MOUTH DAILY. 90 capsule 1  . rOPINIRole (REQUIP) 0.25 MG tablet Take 1 tablet (0.25 mg total) by mouth at bedtime. 30 tablet 2  . traMADol (ULTRAM) 50 MG tablet Take 1 tablet (50 mg total) by mouth every 8 (eight) hours as needed. 90 tablet 5  . zolpidem (AMBIEN) 10 MG tablet TAKE 1 TABLET (10 MG TOTAL) BY MOUTH AT BEDTIME AS NEEDED FOR SLEEP. 90 tablet 1   No current  facility-administered medications for this visit.     Allergies as of 09/27/2018 - Review Complete 09/27/2018  Allergen Reaction Noted  . Fosamax [alendronate sodium] Nausea And Vomiting 03/13/2011  . Penicillins Hives     Vitals: BP (!) 142/75   Ht 4\' 11"  (1.499 m)   Wt 137 lb (62.1 kg)   BMI 27.67 kg/m  Last Weight:  Wt Readings from Last 1 Encounters:  09/27/18 137 lb (62.1 kg)   QTM:AUQJ mass index is 27.67 kg/m.     Last Height:   Ht Readings from Last 1 Encounters:  09/27/18 4\' 11"  (1.499 m)    Physical exam:  General: The patient is awake, alert and appears not in acute distress. The patient is well groomed. Head: Normocephalic, atraumatic. Neck is supple. Mallampati 5 ,  neck circumference:14. Nasal airflow patent- mild retrognathia is seen.  Cardiovascular:  Regular rate and rhythm , without murmurs or carotid bruit, and without distended neck veins. Respiratory: Lungs are clear to auscultation.( She reports she had wheezing in March  ) . Skin:  Without evidence of edema, or rash Trunk: BMI is 28.7. The patient's posture is erect  Neurologic exam : The patient is awake and alert, oriented to place and time.   Attention span & concentration ability appears normal.  Speech is fluent,  Without dysarthria, dysphonia or aphasia.  Mood and affect are appropriate.  Cranial nerves: Pupils are equal in shape and size and briskly reactive to light.    Facial sensation intact to fine touch. Facial motor strength is symmetric and tongue and uvula move midline. Shoulder shrug was symmetrical.   Motor exam:  Normal tone, muscle bulk and symmetric strength .Sensory:  Fine touch, pinprick and vibration were normal .  Coordination: Rapid alternating movements in the fingers/hands and  Finger-to-nose maneuver were normal - without evidence of ataxia, dysmetria or tremor. Gait and station: Patient walks without assistive device.  Deep tendon reflexes: in the  upper and lower  extremities are symmetric and brisk- no clonus.     Ref Range & Units 54mo ago  Total Iron Binding Capacity 250 - 450 ug/dL 248Low    UIBC 118 - 369 ug/dL 172   Iron 27 - 139 ug/dL 76   Iron Saturation 15 - 55 % 31   Ferritin 15 - 150 ng/mL 216High    Resulting Agency  [facility]    Narrative         Assessment:  After physical and neurologic examination, review of laboratory studies,  results of polysomnography:  1) Mrs. Broaddus has PLM related arousals and many spontaneous arousals. She has been doing well on Requip and occasional tylenol PM.   2) Insomnia has improved -  she has been grieving the recent loss of her mother on 09-17-2018. She was the main caretaker, her mother passed at 28 year old.    Rv with NP in 12 month, as needed after that.   Can follow with PCP.     [name] Seat, MD 09/27/2018, 4:19 PM  Certified in Neurology by ABPN Certified in [place] by [facility] [address] [place], [place] [address]

## 2018-10-20 ENCOUNTER — Other Ambulatory Visit (INDEPENDENT_AMBULATORY_CARE_PROVIDER_SITE_OTHER): Payer: Medicare Other

## 2018-10-20 ENCOUNTER — Encounter: Payer: Self-pay | Admitting: Internal Medicine

## 2018-10-20 ENCOUNTER — Ambulatory Visit: Payer: Medicare Other | Admitting: Internal Medicine

## 2018-10-20 VITALS — BP 124/86 | HR 82 | Temp 98.3°F | Ht 59.0 in | Wt 137.0 lb

## 2018-10-20 DIAGNOSIS — E039 Hypothyroidism, unspecified: Secondary | ICD-10-CM

## 2018-10-20 DIAGNOSIS — E785 Hyperlipidemia, unspecified: Secondary | ICD-10-CM | POA: Diagnosis not present

## 2018-10-20 DIAGNOSIS — R7302 Impaired glucose tolerance (oral): Secondary | ICD-10-CM | POA: Diagnosis not present

## 2018-10-20 DIAGNOSIS — Z23 Encounter for immunization: Secondary | ICD-10-CM | POA: Diagnosis not present

## 2018-10-20 DIAGNOSIS — I10 Essential (primary) hypertension: Secondary | ICD-10-CM | POA: Diagnosis not present

## 2018-10-20 DIAGNOSIS — Z0000 Encounter for general adult medical examination without abnormal findings: Secondary | ICD-10-CM | POA: Insufficient documentation

## 2018-10-20 LAB — LIPID PANEL
CHOL/HDL RATIO: 3
Cholesterol: 179 mg/dL (ref 0–200)
HDL: 63.3 mg/dL (ref >39.00)
LDL CALC: 99 mg/dL (ref 0–99)
NONHDL: 115.44
Triglycerides: 83 mg/dL (ref 0.0–149.0)
VLDL: 16.6 mg/dL (ref 0.0–40.0)

## 2018-10-20 LAB — HEPATIC FUNCTION PANEL
ALK PHOS: 86 U/L (ref 39–117)
ALT: 13 U/L (ref 0–35)
AST: 14 U/L (ref 0–37)
Albumin: 4.2 g/dL (ref 3.5–5.2)
BILIRUBIN DIRECT: 0.1 mg/dL (ref 0.0–0.3)
Total Bilirubin: 0.8 mg/dL (ref 0.2–1.2)
Total Protein: 7 g/dL (ref 6.0–8.3)

## 2018-10-20 LAB — BASIC METABOLIC PANEL
BUN: 14 mg/dL (ref 6–23)
CHLORIDE: 103 meq/L (ref 96–112)
CO2: 30 meq/L (ref 19–32)
CREATININE: 0.79 mg/dL (ref 0.40–1.20)
Calcium: 9.5 mg/dL (ref 8.4–10.5)
GFR: 93.5 mL/min (ref >60.00)
Glucose, Bld: 100 mg/dL — ABNORMAL HIGH (ref 70–99)
Potassium: 3.4 mEq/L — ABNORMAL LOW (ref 3.5–5.1)
Sodium: 141 mEq/L (ref 135–145)

## 2018-10-20 LAB — HEMOGLOBIN A1C: Hgb A1c MFr Bld: 6.2 % (ref 4.6–6.5)

## 2018-10-20 LAB — TSH: TSH: 1.48 u[IU]/mL (ref 0.35–4.50)

## 2018-10-20 MED ORDER — LEVALBUTEROL TARTRATE 45 MCG/ACT IN AERO: 2.0000 | INHALATION_SPRAY | Freq: Four times a day (QID) | RESPIRATORY_TRACT | 12 refills | Status: DC

## 2018-10-20 MED ORDER — ZOLPIDEM TARTRATE 10 MG PO TABS: 10.0000 mg | ORAL_TABLET | Freq: Every evening | ORAL | 1 refills | Status: DC | PRN

## 2018-10-20 MED ORDER — ATORVASTATIN CALCIUM 40 MG PO TABS: 40.0000 mg | ORAL_TABLET | Freq: Every day | ORAL | 3 refills | Status: DC

## 2018-10-20 MED ORDER — MONTELUKAST SODIUM 10 MG PO TABS: ORAL_TABLET | ORAL | 3 refills | Status: DC

## 2018-10-20 MED ORDER — POTASSIUM CHLORIDE ER 10 MEQ PO CPCR: ORAL_CAPSULE | ORAL | 3 refills | Status: DC

## 2018-10-20 MED ORDER — CLOPIDOGREL BISULFATE 75 MG PO TABS: ORAL_TABLET | ORAL | 1 refills | Status: DC

## 2018-10-20 MED ORDER — BENAZEPRIL-HYDROCHLOROTHIAZIDE 10-12.5 MG PO TABS: 1.0000 | ORAL_TABLET | Freq: Every day | ORAL | 3 refills | Status: DC

## 2018-10-20 NOTE — Patient Instructions (Addendum)
You have been recently diagnosed with PLMD (periodic limb movement disorder)  You had the flu shot today  Please continue all other medications as before, and refills have been done if requested.  Please have the pharmacy call with any other refills you may need.  Please continue your efforts at being more active, low cholesterol diet, and weight control.  Please keep your appointments with your specialists as you may have planned  Please go to the LAB in the Basement (turn left off the elevator) for the tests to be done today  You will be contacted by phone if any changes need to be made immediately.  Otherwise, you will receive a letter about your results with an explanation, but please check with MyChart first.  Please remember to sign up for MyChart if you have not done so, as this will be important to you in the future with finding out test results, communicating by private email, and scheduling acute appointments online when needed.  Please return in 6 months, or sooner if needed, with Lab testing done 3-5 days before

## 2018-10-20 NOTE — Progress Notes (Signed)
Subjective:    Patient ID: [name] [name], female    DOB: 12/10/1951, 67 y.o.   MRN: [id number]  HPI   Here to f/u; overall doing ok,  Pt denies chest pain, increasing sob or doe, wheezing, orthopnea, PND, increased LE swelling, palpitations, dizziness or syncope.  Pt denies new neurological symptoms such as new headache, or facial or extremity weakness or numbness.  Pt denies polydipsia, polyuria, or low sugar episode.  Pt states overall good compliance with meds, mostly trying to follow appropriate diet, with wt overall stable,  but little exercise however.  Retired x 2 yrs, mother just recently died 10-21-2024with acute CHF with hx of valve replacement that apparently failed.  Still grieving.  Recent dx of PLMD, now with sleep better with combination fo requip and ambien.  Denies hyper or hypo thyroid symptoms such as voice, skin or hair change. Past Medical History:  Diagnosis Date  . ALCOHOL ABUSE, HX OF 11/06/2007  . ALLERGIC RHINITIS 11/06/2007  . Anxiety 04/03/2011  . ASTHMA 09/13/2007  . Chronic pain syndrome 12/15/2016  . COLONIC POLYPS, HX OF 02/09/2008    ADENOMATOUS POLYP  . Encounter for well adult exam without abnormal findings 04/03/2011  . HYPERLIPIDEMIA 11/03/2010  . HYPERTENSION 09/13/2007  . HYPOTHYROIDISM 11/06/2007  . Impaired glucose tolerance 07/30/2013  . INSOMNIA, HX OF 09/13/2007  . Lumbar degenerative disc disease 12/15/2016  . OSTEOPOROSIS 11/06/2007  . PERIMENOPAUSAL STATUS 09/13/2007  . RLS (restless legs syndrome)   . Stroke ([place])   . TIA (transient ischemic attack) 11/04/2011   Past Surgical History:  Procedure Laterality Date  . BREAST EXCISIONAL BIOPSY     left  . BREAST SURGERY  2008 and 2012   x 2 - benign, left side  . carotid artery surgery Left   . CESAREAN SECTION     x 3  . COLONOSCOPY  multiple   2010    reports that she has never smoked. She has never used smokeless tobacco. She reports that she does not drink alcohol or use drugs. family history  includes Breast cancer in her cousin; Coronary artery disease in her other; Dementia in her maternal grandmother; Heart disease in her mother; Hyperlipidemia in her other; Hypertension in her mother and other. Allergies  Allergen Reactions  . Fosamax [Alendronate Sodium] Nausea And Vomiting  . Penicillins Hives   Current Outpatient Medications on File Prior to Visit  Medication Sig Dispense Refill  . acetaminophen (TYLENOL) 325 MG tablet Take 650 mg by mouth every 6 (six) hours as needed.      Marland Kitchen levothyroxine (SYNTHROID, LEVOTHROID) 75 MCG tablet Take 1 tablet (75 mcg total) by mouth daily before breakfast. 90 tablet 3  . rOPINIRole (REQUIP) 0.25 MG tablet Take 1 tablet (0.25 mg total) by mouth at bedtime. 30 tablet 2  . traMADol (ULTRAM) 50 MG tablet Take 1 tablet (50 mg total) by mouth every 8 (eight) hours as needed. 90 tablet 5   No current facility-administered medications on file prior to visit.     Review of Systems  Constitutional: Negative for other unusual diaphoresis or sweats HENT: Negative for ear discharge or swelling Eyes: Negative for other worsening visual disturbances Respiratory: Negative for stridor or other swelling  Gastrointestinal: Negative for worsening distension or other blood Genitourinary: Negative for retention or other urinary change Musculoskeletal: Negative for other MSK pain or swelling Skin: Negative for color change or other new lesions Neurological: Negative for worsening tremors and other numbness  Psychiatric/Behavioral:  Negative for worsening agitation or other fatigue All other system neg per pt    Objective:   Physical Exam BP 124/86   Pulse 82   Temp 98.3 F (36.8 C) (Oral)   Ht 4\' 11"  (1.499 m)   Wt 137 lb (62.1 kg)   SpO2 96%   BMI 27.67 kg/m  VS noted,  Constitutional: Pt appears in NAD HENT: Head: NCAT.  Right Ear: External ear normal.  Left Ear: External ear normal.  Eyes: . Pupils are equal, round, and reactive to light.  Conjunctivae and EOM are normal Nose: without d/c or deformity Neck: Neck supple. Gross normal ROM Cardiovascular: Normal rate and regular rhythm.   Pulmonary/Chest: Effort normal and breath sounds without rales or wheezing.  Abd:  Soft, NT, ND, + BS, no organomegaly Neurological: Pt is alert. At baseline orientation, motor grossly intact Skin: Skin is warm. No rashes, other new lesions, no LE edema Psychiatric: Pt behavior is normal without agitation  No other exam findings Lab Results  Component Value Date   WBC 5.9 04/19/2018   HGB 13.7 04/19/2018   HCT 41.2 04/19/2018   PLT 222.0 04/19/2018   GLUCOSE 100 (H) 10/20/2018   CHOL 179 10/20/2018   TRIG 83.0 10/20/2018   HDL 63.30 10/20/2018   LDLDIRECT 127.1 10/24/2010   LDLCALC 99 10/20/2018   ALT 13 10/20/2018   AST 14 10/20/2018   NA 141 10/20/2018   K 3.4 (L) 10/20/2018   CL 103 10/20/2018   CREATININE 0.79 10/20/2018   BUN 14 10/20/2018   CO2 30 10/20/2018   TSH 1.48 10/20/2018   INR 1.03 03/04/2011   HGBA1C 6.2 10/20/2018         Assessment & Plan:

## 2018-10-22 NOTE — Assessment & Plan Note (Signed)
stable overall by history and exam, recent data reviewed with pt, and pt to continue medical treatment as before,  to f/u any worsening symptoms or concerns  

## 2018-12-28 ENCOUNTER — Ambulatory Visit: Payer: Medicare Other | Admitting: Family Medicine

## 2018-12-28 ENCOUNTER — Encounter: Payer: Self-pay | Admitting: Family Medicine

## 2018-12-28 ENCOUNTER — Other Ambulatory Visit (INDEPENDENT_AMBULATORY_CARE_PROVIDER_SITE_OTHER): Payer: Medicare Other

## 2018-12-28 ENCOUNTER — Ambulatory Visit (INDEPENDENT_AMBULATORY_CARE_PROVIDER_SITE_OTHER)
Admission: RE | Admit: 2018-12-28 | Discharge: 2018-12-28 | Disposition: A | Discharge status: Home / Self Care | Payer: Medicare Other | Source: Ambulatory Visit | Attending: Family Medicine | Admitting: Family Medicine

## 2018-12-28 VITALS — BP 120/82 | HR 82 | Ht 59.0 in | Wt 137.0 lb

## 2018-12-28 DIAGNOSIS — M2550 Pain in unspecified joint: Secondary | ICD-10-CM | POA: Diagnosis not present

## 2018-12-28 DIAGNOSIS — M5136 Other intervertebral disc degeneration, lumbar region: Secondary | ICD-10-CM | POA: Diagnosis not present

## 2018-12-28 DIAGNOSIS — G8929 Other chronic pain: Secondary | ICD-10-CM

## 2018-12-28 DIAGNOSIS — M533 Sacrococcygeal disorders, not elsewhere classified: Secondary | ICD-10-CM | POA: Diagnosis not present

## 2018-12-28 DIAGNOSIS — G894 Chronic pain syndrome: Secondary | ICD-10-CM | POA: Diagnosis not present

## 2018-12-28 DIAGNOSIS — M1812 Unilateral primary osteoarthritis of first carpometacarpal joint, left hand: Secondary | ICD-10-CM

## 2018-12-28 LAB — IBC PANEL
IRON: 74 ug/dL (ref 42–145)
Saturation Ratios: 25.8 % (ref 20.0–50.0)
TRANSFERRIN: 205 mg/dL — AB (ref 212.0–360.0)

## 2018-12-28 LAB — SEDIMENTATION RATE: SED RATE: 35 mm/h — AB (ref 0–30)

## 2018-12-28 LAB — URIC ACID: Uric Acid, Serum: 6.3 mg/dL (ref 2.4–7.0)

## 2018-12-28 NOTE — Progress Notes (Signed)
[name] [address] [place], [place] [address] Phone: [phone] Subjective:     CC: Low back pain, many other pains including left thumb  GLO:[id number]  [name] [name] is a 68 y.o. female coming in with complaint of right sided SI joint pain for 12 years. Intermittent radiating pain. States that she has to walk with her lumbar in flexed position. Has been told that she injured her disc. Has a new mattress. Has used ice and tried stretching. Is trying to walk for exercise.   Also having bilateral thumb pain. States that she has pain in [facility] joint. Pain with pressing up from the chair. Has history of trigger finger but states that this pain is in her joint. Did use splint to resolve triggering prior.       Past Medical History:  Diagnosis Date  . ALCOHOL ABUSE, HX OF 11/06/2007  . ALLERGIC RHINITIS 11/06/2007  . Anxiety 04/03/2011  . ASTHMA 09/13/2007  . Chronic pain syndrome 12/15/2016  . COLONIC POLYPS, HX OF 02/09/2008    ADENOMATOUS POLYP  . Encounter for well adult exam without abnormal findings 04/03/2011  . HYPERLIPIDEMIA 11/03/2010  . HYPERTENSION 09/13/2007  . HYPOTHYROIDISM 11/06/2007  . Impaired glucose tolerance 07/30/2013  . INSOMNIA, HX OF 09/13/2007  . Lumbar degenerative disc disease 12/15/2016  . OSTEOPOROSIS 11/06/2007  . PERIMENOPAUSAL STATUS 09/13/2007  . RLS (restless legs syndrome)   . Stroke ([place])   . TIA (transient ischemic attack) 11/04/2011   Past Surgical History:  Procedure Laterality Date  . BREAST EXCISIONAL BIOPSY     left  . BREAST SURGERY  2008 and 2012   x 2 - benign, left side  . carotid artery surgery Left   . CESAREAN SECTION     x 3  . COLONOSCOPY  multiple   2010   Social History   Socioeconomic History  . Marital status: Divorced    Spouse name: Not on file  . Number of children: 3  . Years of education: Not on file  . Highest education level: Not on file  Occupational History  . Occupation:  Nurse, mental health: [employer]  Social Needs  . Financial resource strain: Not on file  . Food insecurity:    Worry: Not on file    Inability: Not on file  . Transportation needs:    Medical: Not on file    Non-medical: Not on file  Tobacco Use  . Smoking status: Never Smoker  . Smokeless tobacco: Never Used  Substance and Sexual Activity  . Alcohol use: No  . Drug use: No  . Sexual activity: Not on file  Lifestyle  . Physical activity:    Days per week: Not on file    Minutes per session: Not on file  . Stress: Not on file  Relationships  . Social connections:    Talks on phone: Not on file    Gets together: Not on file    Attends religious service: Not on file    Active member of club or organization: Not on file    Attends meetings of clubs or organizations: Not on file    Relationship status: Not on file  Other Topics Concern  . 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 Fosamax [Alendronate Sodium] Nausea And Vomiting  . Penicillins Hives   Family History  Problem Relation Age of Onset  . Hyperlipidemia Other   . Hypertension Other   . Coronary artery disease Other   . Breast cancer Cousin   . Hypertension Mother   . Heart disease Mother   . Dementia Maternal Grandmother   . Colon cancer Neg Hx     Current Outpatient Medications (Endocrine & Metabolic):  .  levothyroxine (SYNTHROID, LEVOTHROID) 75 MCG tablet, Take 1 tablet (75 mcg total) by mouth daily before breakfast.  Current Outpatient Medications (Cardiovascular):  .  atorvastatin (LIPITOR) 40 MG tablet, Take 1 tablet (40 mg total) by mouth daily. .  benazepril-hydrochlorthiazide (LOTENSIN HCT) 10-12.5 MG tablet, Take 1 tablet by mouth daily.  Current Outpatient Medications (Respiratory):  .   levalbuterol (XOPENEX HFA) 45 MCG/ACT inhaler, Inhale 2 puffs into the lungs 4 (four) times daily. .  montelukast (SINGULAIR) 10 MG tablet, 1 tab by mouth daily  Current Outpatient Medications (Analgesics):  .  acetaminophen (TYLENOL) 325 MG tablet, Take 650 mg by mouth every 6 (six) hours as needed.   .  traMADol (ULTRAM) 50 MG tablet, Take 1 tablet (50 mg total) by mouth every 8 (eight) hours as needed.  Current Outpatient Medications (Hematological):  .  clopidogrel (PLAVIX) 75 MG tablet, TAJE 1 TABLET BY MOUTH DAILY.  Current Outpatient Medications (Other):  .  potassium chloride (MICRO-K) 10 MEQ CR capsule, TAKE 1 TABLET BY MOUTH DAILY. Marland Kitchen  rOPINIRole (REQUIP) 0.25 MG tablet, Take 1 tablet (0.25 mg total) by mouth at bedtime. Marland Kitchen  zolpidem (AMBIEN) 10 MG tablet, Take 1 tablet (10 mg total) by mouth at bedtime as needed for sleep.    Past medical history, social, surgical and family history all reviewed in electronic medical record.  No pertanent information unless stated regarding to the chief complaint.   Review of Systems:  No headache, visual changes, nausea, vomiting, diarrhea, constipation, dizziness, abdominal pain, skin rash, fevers, chills, night sweats, weight loss, swollen lymph nodes,  joint swelling,  chest pain, shortness of breath, mood changes.  Mild positive muscle aches, body aches  Objective  Blood pressure 120/82, pulse 82, height 4\' 11"  (1.499 m), weight 137 lb (62.1 kg), SpO2 99 %.   General: No apparent distress alert and oriented x3 mood and affect normal, dressed appropriately.  HEENT: Pupils equal, extraocular movements intact  Respiratory: Patient's speak in full sentences and does not appear short of breath  Cardiovascular: No lower extremity edema, non tender, no erythema  Skin: Warm dry intact with no signs of infection or rash on extremities or on axial skeleton.  Abdomen: Soft nontender  Neuro: Cranial nerves II through XII are intact, neurovascularly  intact in all extremities with 2+ DTRs and 2+ pulses.  Lymph: No lymphadenopathy of posterior or anterior cervical chain or axillae bilaterally.  Gait antalgic  MSK:  tender with limited range of motion and stability symmetric strength and tone of shoulders, elbows, wrist, hip, knee and ankles bilaterally.   Left CMC positive grind mild weakness contralateral thumb mild OA  Back Exam:  Inspection: Unremarkable  Motion: Flexion 45 deg, Extension 45 deg, Side Bending to 45 deg bilaterally,  Rotation to 45 deg bilaterally  SLR laying: Negative  XSLR laying: Negative  Palpable tenderness: TTP right .  Sacroiliac joint and the paraspinal musculature lumbar spine FABER: Positive right. Sensory change: Gross sensation intact to all lumbar and sacral dermatomes.  Reflexes:  2+ at both patellar tendons, 2+ at achilles tendons, Babinski's downgoing.  Strength at foot  Plantar-flexion: 5/5 Dorsi-flexion: 5/5 Eversion: 5/5 Inversion: 5/5  Leg strength  Quad: 5/5 Hamstring: 5/5 Hip flexor: 5/5 Hip abductors: 4/5 symmetric  Gait unremarkable.        Impression and Recommendations:     This case required medical decision making of moderate complexity. The above documentation has been reviewed and is accurate and complete Hulan Saas.      Note: This dictation was prepared with Dragon dictation along with smaller phrase technology. Any transcriptional errors that result from this process are unintentional.

## 2018-12-28 NOTE — Patient Instructions (Addendum)
Good to see you  Xray and labs downstairs Ice 20 minutes 2 times daily. Usually after activity and before bed. pennsaid pinkie amount topically 2 times daily as needed.  Exercises 3 times a week.  Stay active  Splint at night for the left thumb  Vitamin D 2000 IU daily  Turmeric 500mg  daily  Tart cherry extract any dose at night See me again in 4-6 weeks

## 2018-12-28 NOTE — Assessment & Plan Note (Signed)
Known degenerative disc disease from an outside facility.  New x-rays ordered today.  Discussed icing regimen and home exercise.  Discussed which activities to do which was to avoid.  Follow-up again in 4 to 8 weeks

## 2018-12-28 NOTE — Assessment & Plan Note (Signed)
Significant joint pain.  Discussed icing regimen and home exercise.  Discussed which activities to do which wants to avoid.  Patient is to increase activity as tolerated.  We discussed that with patient's pain being more of a polyarthralgia that seems to be migratory I do feel that laboratory work-up is necessary.

## 2018-12-29 LAB — VITAMIN D 25 HYDROXY (VIT D DEFICIENCY, FRACTURES): VITD: 40.35 ng/mL (ref 30.00–100.00)

## 2018-12-29 LAB — FERRITIN: FERRITIN: 47.6 ng/mL (ref 10.0–291.0)

## 2018-12-30 LAB — ANA: Anti Nuclear Antibody(ANA): NEGATIVE

## 2018-12-30 LAB — CALCIUM, IONIZED: CALCIUM ION: 5.09 mg/dL (ref 4.8–5.6)

## 2018-12-30 LAB — RHEUMATOID FACTOR: Rhuematoid fact SerPl-aCnc: <14 IU/mL (ref <14)

## 2018-12-30 LAB — ANGIOTENSIN CONVERTING ENZYME: ANGIOTENSIN-CONVERTING ENZYME: 13 U/L (ref 9–67)

## 2018-12-30 LAB — CYCLIC CITRUL PEPTIDE ANTIBODY, IGG: Cyclic Citrullin Peptide Ab: <16 UNITS

## 2018-12-30 LAB — PTH, INTACT AND CALCIUM
CALCIUM: 9.5 mg/dL (ref 8.6–10.4)
PTH: 28 pg/mL (ref 14–64)

## 2019-01-16 ENCOUNTER — Other Ambulatory Visit: Payer: Self-pay | Admitting: Neurology

## 2019-01-16 ENCOUNTER — Telehealth: Payer: Self-pay | Admitting: Neurology

## 2019-01-16 MED ORDER — ROPINIROLE HCL 0.5 MG PO TABS: 0.2500 mg | ORAL_TABLET | Freq: Every day | ORAL | 5 refills | Status: DC

## 2019-01-16 NOTE — Telephone Encounter (Signed)
Called the patient and made her aware that Dr Brett Fairy would like her to increase the dose to 0.5 mg nightly. I have sent a refill for the patient at the new dose. Advised the pt to take 2 of the tablets she has until they are all gone since she just picked up a refill. Advised the script will be on file for her at the pharmacy when she needs it. Pt verbalized understanding. Pt had no questions at this time but was encouraged to call back if questions arise.

## 2019-01-16 NOTE — Telephone Encounter (Signed)
Increase to 0.5 mg nightly

## 2019-01-16 NOTE — Telephone Encounter (Signed)
Pt states the medication prescribed for her RLS is no longer working.  Pt is asking for a call from RN to discuss options.  Pt still uses :[id number]

## 2019-01-20 ENCOUNTER — Telehealth: Payer: Self-pay | Admitting: Internal Medicine

## 2019-01-20 NOTE — Telephone Encounter (Signed)
Copied from [place] [id number]. Topic: General - Other >> Jan 20, 2019  9:29 AM Lennox Solders wrote: Reason for CRM:  Pt is calling and she needs a PA on zolpidem 10 mg . Pt has chronic insomnia. Please call [phone] to obtain PA

## 2019-01-20 NOTE — Telephone Encounter (Signed)
Key: [id number]

## 2019-01-20 NOTE — Telephone Encounter (Signed)
AMBIEN TAB 10MG  is approved through 12/14/2019

## 2019-01-24 ENCOUNTER — Ambulatory Visit: Payer: Medicare Other | Admitting: Family

## 2019-01-24 ENCOUNTER — Encounter: Payer: Self-pay | Admitting: Family

## 2019-01-24 VITALS — BP 122/70 | HR 86 | Temp 98.8°F | Ht 59.0 in

## 2019-01-24 DIAGNOSIS — R6889 Other general symptoms and signs: Secondary | ICD-10-CM

## 2019-01-24 LAB — POC INFLUENZA A&B (BINAX/QUICKVUE)
Influenza A, POC: NEGATIVE
Influenza B, POC: NEGATIVE

## 2019-01-24 MED ORDER — HYDROCODONE-HOMATROPINE 5-1.5 MG/5ML PO SYRP: 5.0000 mL | ORAL_SOLUTION | Freq: Three times a day (TID) | ORAL | 0 refills | Status: DC | PRN

## 2019-01-24 NOTE — Progress Notes (Signed)
[name] [name] is a 68 y.o. female with the following history as recorded in EpicCare:  Patient Active Problem List   Diagnosis Date Noted  . Polyarthralgia 12/28/2018  . Preventative health care 10/20/2018  . RLS (restless legs syndrome) 09/27/2018  . Insomnia disorder related to known organic factor 06/27/2018  . PLMD (periodic limb movement disorder) 06/27/2018  . Hot flashes due to menopause 04/25/2018  . Heart palpitations 04/25/2018  . Diaphoresis 04/25/2018  . Long term current use of antithrombotics/antiplatelets 04/19/2018  . Insomnia 04/19/2018  . Intractable episodic headache 02/28/2018  . Numbness and tingling of right arm 02/28/2018  . Toe pain, right 10/19/2017  . Rotator cuff arthropathy of left shoulder 06/02/2017  . Trigger thumb of left hand 06/02/2017  . Lipoma 05/06/2017  . Pain of left thumb 05/06/2017  . Chronic pain syndrome 12/15/2016  . Lumbar degenerative disc disease 12/15/2016  . Left leg pain 01/28/2016  . Left shoulder pain 04/09/2015  . Impaired glucose tolerance 07/30/2013  . Abnormal breath sounds 07/30/2013  . Left carotid stenosis 05/05/2012  . Anxiety 04/03/2011  . CVA (cerebral infarction) 03/15/2011  . HLD (hyperlipidemia) 11/03/2010  . Hx of adenomatous colonic polyps 02/09/2008  . Hypothyroidism 11/06/2007  . ALLERGIC RHINITIS 11/06/2007  . OSTEOPOROSIS 11/06/2007  . ALCOHOL ABUSE, HX OF 11/06/2007  . Essential hypertension 09/13/2007  . Asthma 09/13/2007  . PERIMENOPAUSAL STATUS 09/13/2007  . Chronic insomnia 09/13/2007    Current Outpatient Medications  Medication Sig Dispense Refill  . acetaminophen (TYLENOL) 325 MG tablet Take 650 mg by mouth every 6 (six) hours as needed.      Marland Kitchen atorvastatin (LIPITOR) 40 MG tablet Take 1 tablet (40 mg total) by mouth daily. 90 tablet 3  . benazepril-hydrochlorthiazide (LOTENSIN HCT) 10-12.5 MG tablet Take 1 tablet by mouth daily. 90 tablet 3  . clopidogrel (PLAVIX) 75 MG tablet TAJE 1 TABLET  BY MOUTH DAILY. 90 tablet 1  . levalbuterol (XOPENEX HFA) 45 MCG/ACT inhaler Inhale 2 puffs into the lungs 4 (four) times daily. 1 Inhaler 12  . levothyroxine (SYNTHROID, LEVOTHROID) 75 MCG tablet Take 1 tablet (75 mcg total) by mouth daily before breakfast. 90 tablet 3  . montelukast (SINGULAIR) 10 MG tablet 1 tab by mouth daily 90 tablet 3  . potassium chloride (MICRO-K) 10 MEQ CR capsule TAKE 1 TABLET BY MOUTH DAILY. 90 capsule 3  . rOPINIRole (REQUIP) 0.5 MG tablet Take 0.5 tablets (0.25 mg total) by mouth at bedtime. 30 tablet 5  . traMADol (ULTRAM) 50 MG tablet Take 1 tablet (50 mg total) by mouth every 8 (eight) hours as needed. 90 tablet 5  . zolpidem (AMBIEN) 10 MG tablet TAKE 1 TABLET (10 MG TOTAL) BY MOUTH AT BEDTIME AS NEEDED FOR SLEEP.    Marland Kitchen HYDROcodone-homatropine (HYCODAN) 5-1.5 MG/5ML syrup Take 5 mLs by mouth every 8 (eight) hours as needed for cough. 120 mL 0  . zolpidem (AMBIEN) 10 MG tablet Take 1 tablet (10 mg total) by mouth at bedtime as needed for sleep. 90 tablet 1   No current facility-administered medications for this visit.     Allergies: Fosamax [alendronate sodium] and Penicillins  Past Medical History:  Diagnosis Date  . ALCOHOL ABUSE, HX OF 11/06/2007  . ALLERGIC RHINITIS 11/06/2007  . Anxiety 04/03/2011  . ASTHMA 09/13/2007  . Chronic pain syndrome 12/15/2016  . COLONIC POLYPS, HX OF 02/09/2008    ADENOMATOUS POLYP  . Encounter for well adult exam without abnormal findings 04/03/2011  . HYPERLIPIDEMIA 11/03/2010  .  HYPERTENSION 09/13/2007  . HYPOTHYROIDISM 11/06/2007  . Impaired glucose tolerance 07/30/2013  . INSOMNIA, HX OF 09/13/2007  . Lumbar degenerative disc disease 12/15/2016  . OSTEOPOROSIS 11/06/2007  . PERIMENOPAUSAL STATUS 09/13/2007  . RLS (restless legs syndrome)   . Stroke ([place])   . TIA (transient ischemic attack) 11/04/2011    Past Surgical History:  Procedure Laterality Date  . BREAST EXCISIONAL BIOPSY     left  . BREAST SURGERY  2008 and  2012   x 2 - benign, left side  . carotid artery surgery Left   . CESAREAN SECTION     x 3  . COLONOSCOPY  multiple   2010    Family History  Problem Relation Age of Onset  . Hyperlipidemia Other   . Hypertension Other   . Coronary artery disease Other   . Breast cancer Cousin   . Hypertension Mother   . Heart disease Mother   . Dementia Maternal Grandmother   . Colon cancer Neg Hx     Social History   Tobacco Use  . Smoking status: Never Smoker  . Smokeless tobacco: Never Used  Substance Use Topics  . Alcohol use: No    Subjective:  2-3 day history of cough/ chills/ body aches; notes she started with "the sniffles" last week and then felt "really sick" on Sunday; no fever; concerned about cough- feels like cannot cough the congestion up; using OTC Mucinex with no benefit; may have run a fever last night;    Objective:  Vitals:   01/24/19 1042  BP: 122/70  Pulse: 86  Temp: 98.8 F (37.1 C)  TempSrc: Oral  SpO2: 96%  Height: 4\' 11"  (1.499 m)    General: Well developed, well nourished, in no acute distress  Skin : Warm and dry.  Head: Normocephalic and atraumatic  Eyes: Sclera and conjunctiva clear; pupils round and reactive to light; extraocular movements intact  Ears: External normal; canals clear; tympanic membranes normal  Oropharynx: Pink, supple. No suspicious lesions  Neck: Supple without thyromegaly, adenopathy  Lungs: Respirations unlabored; clear to auscultation bilaterally without wheeze, rales, rhonchi  CVS exam: normal rate and regular rhythm.  Neurologic: Alert and oriented; speech intact; face symmetrical; moves all extremities well; CNII-XII intact without focal deficit   Assessment:  1. Flu-like symptoms     Plan:  Rapid flu is negative; symptomatic treatment discussed; encouraged to use albuterol inhaler every 6- hours prn; Rx for Hycodan to use as directed; increase fluids, rest and follow-up worse, no better.   No follow-ups on file.   Orders Placed This Encounter  Procedures  . POC Influenza A&B (Binax test)    Requested Prescriptions   Signed Prescriptions Disp Refills  . HYDROcodone-homatropine (HYCODAN) 5-1.5 MG/5ML syrup 120 mL 0    Sig: Take 5 mLs by mouth every 8 (eight) hours as needed for cough.

## 2019-01-24 NOTE — Patient Instructions (Addendum)
  If your symptoms do not improve by Friday, please call back and I will call in something else for you.    Viral Respiratory Infection A viral respiratory infection is an illness that affects parts of the body that are used for breathing. These include the lungs, nose, and throat. It is caused by a germ called a virus. Some examples of this kind of infection are:  A cold.  The flu (influenza).  A respiratory syncytial virus (RSV) infection. A person who gets this illness may have the following symptoms:  A stuffy or runny nose.  Yellow or green fluid in the nose.  A cough.  Sneezing.  Tiredness (fatigue).  Achy muscles.  A sore throat.  Sweating or chills.  A fever.  A headache. Follow these instructions at home: Managing pain and congestion  Take over-the-counter and prescription medicines only as told by your doctor.  If you have a sore throat, gargle with salt water. Do this 3-4 times per day or as needed. To make a salt-water mixture, dissolve -1 tsp of salt in 1 cup of warm water. Make sure that all the salt dissolves.  Use nose drops made from salt water. This helps with stuffiness (congestion). It also helps soften the skin around your nose.  Drink enough fluid to keep your pee (urine) pale yellow. General instructions   Rest as much as possible.  Do not drink alcohol.  Do not use any products that have nicotine or tobacco, such as cigarettes and e-cigarettes. If you need help quitting, ask your doctor.  Keep all follow-up visits as told by your doctor. This is important. How is this prevented?   Get a flu shot every year. Ask your doctor when you should get your flu shot.  Do not let other people get your germs. If you are sick: ? Stay home from work or school. ? Wash your hands with soap and water often. Wash your hands after you cough or sneeze. If soap and water are not available, use hand sanitizer.  Avoid contact with people who are sick  during cold and flu season. This is in fall and winter. Get help if:  Your symptoms last for 10 days or longer.  Your symptoms get worse over time.  You have a fever.  You have very bad pain in your face or forehead.  Parts of your jaw or neck become very swollen. Get help right away if:  You feel pain or pressure in your chest.  You have shortness of breath.  You faint or feel like you will faint.  You keep throwing up (vomiting).  You feel confused. Summary  A viral respiratory infection is an illness that affects parts of the body that are used for breathing.  Examples of this illness include a cold, the flu, and respiratory syncytial virus (RSV) infection.  The infection can cause a runny nose, cough, sneezing, sore throat, and fever.  Follow what your doctor tells you about taking medicines, drinking lots of fluid, washing your hands, resting at home, and avoiding people who are sick. This information is not intended to replace advice given to you by your health care provider. Make sure you discuss any questions you have with your health care provider. Document Released: 11/12/2008 Document Revised: 01/10/2018 Document Reviewed: 01/10/2018 Elsevier Interactive Patient Education  2019 [employer].

## 2019-01-25 ENCOUNTER — Ambulatory Visit: Payer: Medicare Other | Admitting: Family Medicine

## 2019-01-27 ENCOUNTER — Other Ambulatory Visit: Payer: Self-pay | Admitting: Internal Medicine

## 2019-01-30 ENCOUNTER — Telehealth: Payer: Self-pay

## 2019-01-30 MED ORDER — AZITHROMYCIN 250 MG PO TABS: ORAL_TABLET | ORAL | 1 refills | Status: DC

## 2019-01-30 NOTE — Addendum Note (Signed)
Addended by: Biagio Borg on: 01/30/2019 01:02 PM   Modules accepted: Orders

## 2019-01-30 NOTE — Telephone Encounter (Signed)
Done erx 

## 2019-01-30 NOTE — Telephone Encounter (Signed)
Copied from [place] [id number]. Topic: General - Other >> Jan 30, 2019 10:40 AM Yvette Rack wrote: Reason for CRM: Pt stated she saw Jodi Mourning last week and she was told to call back if she needed an antibiotic called in to her pharmacy. Pt request that the antibiotic be sent to [id number] - [place], [place]

## 2019-01-30 NOTE — Telephone Encounter (Signed)
Per Angela Nevin:  Mickel Baas is out of the office today and tomorrow. Since patient is a Dr. [name] patient can you see if he will address so patient doesn't have to wait until Wed when Mickel Baas is back?   Thanks

## 2019-02-06 NOTE — Progress Notes (Signed)
[name] [address] [place], [place] [address] Phone: [phone] Subjective:     CC: Back pain follow-up  AOZ:[id number]    Significant joint pain.  Discussed icing regimen and home exercise.  Discussed which activities to do which wants to avoid.  Patient is to increase activity as tolerated.  We discussed that with patient's pain being more of a polyarthralgia that seems to be migratory I do feel that laboratory work-up is necessary.  Update 02/07/2019: [name] [name] is a 68 y.o. female coming in with complaint of back pain. Patient states that she had a cold last week. Feels the same as last visit.  Patient's x-rays were independently visualized by me showing degenerative disc disease that was diffuse in the lumbar spine with a 9 mm spondylolisthesis at L4-L5     Past Medical History:  Diagnosis Date  . ALCOHOL ABUSE, HX OF 11/06/2007  . ALLERGIC RHINITIS 11/06/2007  . Anxiety 04/03/2011  . ASTHMA 09/13/2007  . Chronic pain syndrome 12/15/2016  . COLONIC POLYPS, HX OF 02/09/2008    ADENOMATOUS POLYP  . Encounter for well adult exam without abnormal findings 04/03/2011  . HYPERLIPIDEMIA 11/03/2010  . HYPERTENSION 09/13/2007  . HYPOTHYROIDISM 11/06/2007  . Impaired glucose tolerance 07/30/2013  . INSOMNIA, HX OF 09/13/2007  . Lumbar degenerative disc disease 12/15/2016  . OSTEOPOROSIS 11/06/2007  . PERIMENOPAUSAL STATUS 09/13/2007  . RLS (restless legs syndrome)   . Stroke ([place])   . TIA (transient ischemic attack) 11/04/2011   Past Surgical History:  Procedure Laterality Date  . BREAST EXCISIONAL BIOPSY     left  . BREAST SURGERY  2008 and 2012   x 2 - benign, left side  . carotid artery surgery Left   . CESAREAN SECTION     x 3  . COLONOSCOPY  multiple   2010   Social History   Socioeconomic History  . Marital status: Divorced    Spouse name: Not on file  . Number of children: 3  . Years of education: Not on file  . Highest  education level: Not on file  Occupational History  . Occupation: Nurse, mental health: [employer]  Social Needs  . Financial resource strain: Not on file  . Food insecurity:    Worry: Not on file    Inability: Not on file  . Transportation needs:    Medical: Not on file    Non-medical: Not on file  Tobacco Use  . Smoking status: Never Smoker  . Smokeless tobacco: Never Used  Substance and Sexual Activity  . Alcohol use: No  . Drug use: No  . Sexual activity: Not on file  Lifestyle  . Physical activity:    Days per week: Not on file    Minutes per session: Not on file  . Stress: Not on file  Relationships  . Social connections:    Talks on phone: Not on file    Gets together: Not on file    Attends religious service: Not on file    Active member of club or organization: Not on file    Attends meetings of clubs or organizations: Not on file    Relationship status: Not on file  Other Topics Concern  . 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 Fosamax [Alendronate Sodium] Nausea And Vomiting  . Penicillins Hives   Family History  Problem Relation Age of Onset  . Hyperlipidemia Other   . Hypertension Other   . Coronary artery disease Other   . Breast cancer Cousin   . Hypertension Mother   . Heart disease Mother   . Dementia Maternal Grandmother   . Colon cancer Neg Hx     Current Outpatient Medications (Endocrine & Metabolic):  .  levothyroxine (SYNTHROID, LEVOTHROID) 75 MCG tablet, TAKE 1 TABLET (75 MCG TOTAL) BY MOUTH DAILY BEFORE BREAKFAST.  Current Outpatient Medications (Cardiovascular):  .  atorvastatin (LIPITOR) 40 MG tablet, Take 1 tablet (40 mg total) by mouth daily. .  benazepril-hydrochlorthiazide (LOTENSIN HCT) 10-12.5 MG tablet, Take 1 tablet by mouth  daily.  Current Outpatient Medications (Respiratory):  .  HYDROcodone-homatropine (HYCODAN) 5-1.5 MG/5ML syrup, Take 5 mLs by mouth every 8 (eight) hours as needed for cough. .  levalbuterol (XOPENEX HFA) 45 MCG/ACT inhaler, Inhale 2 puffs into the lungs 4 (four) times daily. .  montelukast (SINGULAIR) 10 MG tablet, 1 tab by mouth daily  Current Outpatient Medications (Analgesics):  .  acetaminophen (TYLENOL) 325 MG tablet, Take 650 mg by mouth every 6 (six) hours as needed.   .  traMADol (ULTRAM) 50 MG tablet, Take 1 tablet (50 mg total) by mouth every 8 (eight) hours as needed.  Current Outpatient Medications (Hematological):  .  clopidogrel (PLAVIX) 75 MG tablet, TAJE 1 TABLET BY MOUTH DAILY.  Current Outpatient Medications (Other):  .  azithromycin (ZITHROMAX Z-PAK) 250 MG tablet, 2 tab by mouth day 1, then 1 per day .  potassium chloride (MICRO-K) 10 MEQ CR capsule, TAKE 1 TABLET BY MOUTH DAILY. Marland Kitchen  rOPINIRole (REQUIP) 0.5 MG tablet, Take 0.5 tablets (0.25 mg total) by mouth at bedtime. Marland Kitchen  zolpidem (AMBIEN) 10 MG tablet, TAKE 1 TABLET (10 MG TOTAL) BY MOUTH AT BEDTIME AS NEEDED FOR SLEEP. Marland Kitchen  gabapentin (NEURONTIN) 100 MG capsule, Take 2 capsules (200 mg total) by mouth at bedtime. Marland Kitchen  zolpidem (AMBIEN) 10 MG tablet, Take 1 tablet (10 mg total) by mouth at bedtime as needed for sleep.    Past medical history, social, surgical and family history all reviewed in electronic medical record.  No pertanent information unless stated regarding to the chief complaint.   Review of Systems:  No headache, visual changes, nausea, vomiting, diarrhea, constipation, dizziness, abdominal pain, skin rash, fevers, chills, night sweats, weight loss, swollen lymph nodes, body aches, joint swelling,, chest pain, shortness of breath, mood changes.  Positive muscle aches  Objective  Blood pressure 122/84, pulse 72, height 4\' 11"  (1.499 m), weight 133 lb (60.3 kg), SpO2 97 %.   General: No apparent distress  alert and oriented x3 mood and affect normal, dressed appropriately.  HEENT: Pupils equal, extraocular movements intact  Respiratory: Patient's speak in full sentences and does not appear short of breath  Cardiovascular: No lower extremity edema, non tender, no erythema  Skin: Warm dry intact with no signs of infection or rash on extremities or on axial skeleton.  Abdomen: Soft nontender  Neuro: Cranial nerves II through XII are intact, neurovascularly intact in all extremities with 2+ DTRs and 2+ pulses.  Lymph: No lymphadenopathy of posterior or anterior cervical chain or axillae bilaterally.  Gait mild antalgic MSK:  tender with limited range of motion and good stability and symmetric strength and tone of shoulders, elbows, wrist,  knee and ankles bilaterally.  Back exam loss of lordosis with some mild degenerative scoliosis.  Tightness with straight leg test bilaterally but no radicular symptoms. Tightness of the piriformis bilaterally with Corky Sox.  4+ out of 5 strength of the lower extremities.  Deep tendon reflexes intact.  Good capillary refill of the distal pulses.    Impression and Recommendations:     This case required medical decision making of moderate complexity. The above documentation has been reviewed and is accurate and complete Lyndal Pulley, DO       Note: This dictation was prepared with Dragon dictation along with smaller phrase technology. Any transcriptional errors that result from this process are unintentional.

## 2019-02-07 ENCOUNTER — Ambulatory Visit: Payer: Medicare Other | Admitting: Family Medicine

## 2019-02-07 VITALS — BP 122/84 | HR 72 | Ht 59.0 in | Wt 133.0 lb

## 2019-02-07 DIAGNOSIS — M5136 Other intervertebral disc degeneration, lumbar region: Secondary | ICD-10-CM | POA: Diagnosis not present

## 2019-02-07 DIAGNOSIS — M51369 Other intervertebral disc degeneration, lumbar region without mention of lumbar back pain or lower extremity pain: Secondary | ICD-10-CM

## 2019-02-07 MED ORDER — GABAPENTIN 100 MG PO CAPS: 200.0000 mg | ORAL_CAPSULE | Freq: Every day | ORAL | 3 refills | Status: DC

## 2019-02-07 NOTE — Patient Instructions (Addendum)
Good to see you  Ice 20 minutes 2 times daily. Usually after activity and before bed. PT will be calling you  Add gabapentin 200mg  at night- can try 1-3 pills if you need them  Stop the requip for a week and then if still trouble then in 1 week start it  See me again in 6-7 weeks

## 2019-02-07 NOTE — Assessment & Plan Note (Signed)
Moderate to severe.  Will send patient to physical therapy.  Hopefully this will be beneficial.  Discussed posture and ergonomics.  Discussed which activities to do which was to avoid.  Patient is to increase activity slowly over the course the next several days.  Follow-up with me again in 6 to 8 weeks changes in medicines per AVS

## 2019-02-14 ENCOUNTER — Other Ambulatory Visit: Payer: Self-pay

## 2019-02-14 ENCOUNTER — Ambulatory Visit: Payer: Medicare Other | Attending: Family Medicine

## 2019-02-14 DIAGNOSIS — M62830 Muscle spasm of back: Secondary | ICD-10-CM | POA: Diagnosis present

## 2019-02-14 DIAGNOSIS — M545 Low back pain, unspecified: Secondary | ICD-10-CM

## 2019-02-14 DIAGNOSIS — R293 Abnormal posture: Secondary | ICD-10-CM | POA: Diagnosis present

## 2019-02-14 DIAGNOSIS — M6281 Muscle weakness (generalized): Secondary | ICD-10-CM

## 2019-02-14 DIAGNOSIS — G8929 Other chronic pain: Secondary | ICD-10-CM | POA: Insufficient documentation

## 2019-02-14 NOTE — Therapy (Signed)
[place], [place], [address] Phone: [phone]   Fax:  [phone]  Physical Therapy Evaluation  Patient Details  Name: [name] [name] MRN: [id number] Date of Birth: 1951/11/03 Referring Provider (PT): Hulan Saas , DO   Encounter Date: 02/14/2019  PT End of Session - 02/14/19 1140    Visit Number  1    Number of Visits  12    Date for PT Re-Evaluation  03/24/19    Authorization Type  [employer] MCR    PT Start Time  1100    PT Stop Time  1145    PT Time Calculation (min)  45 min    Activity Tolerance  Patient tolerated treatment well;Patient limited by pain    Behavior During Therapy  [facility] for tasks assessed/performed       Past Medical History:  Diagnosis Date  . ALCOHOL ABUSE, HX OF 11/06/2007  . ALLERGIC RHINITIS 11/06/2007  . Anxiety 04/03/2011  . ASTHMA 09/13/2007  . Chronic pain syndrome 12/15/2016  . COLONIC POLYPS, HX OF 02/09/2008    ADENOMATOUS POLYP  . Encounter for well adult exam without abnormal findings 04/03/2011  . HYPERLIPIDEMIA 11/03/2010  . HYPERTENSION 09/13/2007  . HYPOTHYROIDISM 11/06/2007  . Impaired glucose tolerance 07/30/2013  . INSOMNIA, HX OF 09/13/2007  . Lumbar degenerative disc disease 12/15/2016  . OSTEOPOROSIS 11/06/2007  . PERIMENOPAUSAL STATUS 09/13/2007  . RLS (restless legs syndrome)   . Stroke ([place])   . TIA (transient ischemic attack) 11/04/2011    Past Surgical History:  Procedure Laterality Date  . BREAST EXCISIONAL BIOPSY     left  . BREAST SURGERY  2008 and 2012   x 2 - benign, left side  . carotid artery surgery Left   . CESAREAN SECTION     x 3  . COLONOSCOPY  multiple   2010    There were no vitals filed for this visit.   Subjective Assessment - 02/14/19 1109    Subjective  She reports her back has arthritis. Her back gives out at times.   5 years on onset of pain.   She was out of work for 4 months.   She returned to work but retired 2 years ago.     After retired did not  improve  back.    Last bad episode was 02/11/19.  Rain incr stiffness and pain.    She is doing the HEP from MD.      Limitations  House hold activities;Walking;Standing   dressing, getting up from chair.     How long can you sit comfortably?  1-2 hour    How long can you stand comfortably?  1-2 hours    How long can you walk comfortably?  able to walk in large department store    Diagnostic tests  Xray , DDD lumbar    Patient Stated Goals  Her back to feel better    Currently in Pain?  Yes    Pain Score  6     Pain Location  Back    Pain Orientation  Lower   more central   Pain Descriptors / Indicators  Nagging   pinching   Pain Type  Chronic pain    Pain Radiating Towards  posterior thighs when bac    Pain Onset  More than a month ago    Pain Frequency  Constant    Aggravating Factors   Not sure    Pain Relieving Factors  medication  [facility] PT Assessment - 02/14/19 0001      Assessment   Medical Diagnosis  DDD lumbar     Referring Provider (PT)  Hulan Saas , DO    Onset Date/Surgical Date  --   5 years ago   Next MD Visit  7 weeks    Prior Therapy  no      Precautions   Precautions  None      Restrictions   Weight Bearing Restrictions  No      Balance Screen   Has the patient fallen in the past 6 months  No      Prior Function   Level of Independence  Independent    Vocation  Retired      Associate Professor   Overall Cognitive Status  Within Functional Limits for tasks assessed      Posture/Postural Control   Posture Comments  RT ilia higher thand LT and LT shoulder higher than RT      ROM / Strength   AROM / PROM / Strength  AROM;Strength;PROM      AROM   AROM Assessment Site  Lumbar    Lumbar Flexion  65    Lumbar Extension  25   more pain  than flexion   Lumbar - Right Side Bend  20    Lumbar - Left Side Bend  15   pain   Lumbar - Right Rotation  75%   pain in RT flank   Lumbar - Left Rotation  75%      PROM   PROM  Assessment Site  Hip    Right/Left Hip  Right;Left    Right Hip External Rotation   30    Right Hip Internal Rotation   20    Left Hip Extension  5    Left Hip Flexion  125    Left Hip External Rotation   60    Left Hip Internal Rotation   15    Left Hip ABduction  25    Left Hip ADduction  15      Strength   Overall Strength Comments  LE WNL bilateral      Right Hip   Right Hip Flexion  125    Right Hip ABduction  22    Right Hip ADduction  15      Flexibility   Soft Tissue Assessment /Muscle Length  yes      Palpation   Palpation comment  tender across lower back                Objective measurements completed on examination: See above findings.              PT Education - 02/14/19 1140    Education Details  POC     Person(s) Educated  Patient    Methods  Explanation    Comprehension  Verbalized understanding       PT Short Term Goals - 02/14/19 1228      PT SHORT TERM GOAL #1   Title  She will be independent with initial HEP     Time  3    Period  Weeks    Status  New      PT SHORT TERM GOAL #2   Title  She will report pain gnerally decreased 25% or mre    Time  3    Period  Weeks    Status  New        PT Long Term  Goals - 02/14/19 1229      PT LONG TERM GOAL #1   Title  She will be indpendent with all hEP issued    Time  6    Period  Weeks    Status  New      PT LONG TERM GOAL #2   Title  She will report her pain as intermittant    Time  6    Period  Weeks    Status  New      PT LONG TERM GOAL #3   Title  She will report able to perform normal home tasks with 50% decreased pain.    Time  6    Period  Weeks    Status  New      PT LONG TERM GOAL #4   Title  FOTO score will improve to 33% limited or better    Time  6    Period  Weeks    Status  New      PT LONG TERM GOAL #5   Title  She will report that bad episodes will decreased 50% by end of 6 weeks    Time  6    Period  Weeks    Status  New              Plan - 02/14/19 1141    Clinical Impression Statement  MS Calamia reports 5 year history of LBP with many exacerbations fuly limiting  activity on these days.    She had abnormal posture , spasm and limtied trunk and hip ROM.  She also report RT hip pain with ROM.   She should improved with slilled PT and her doing her HEP    Examination-Activity Limitations  Bend;Lift;Squat;Carry    Clinical Decision Making  Low    Rehab Potential  Good    PT Frequency  2x / week    PT Duration  6 weeks    PT Treatment/Interventions  Moist Heat;Therapeutic activities;Therapeutic exercise;Patient/family education;Manual techniques;Passive range of motion;Taping;Dry needling    PT Next Visit Plan  start flexion based exercise, stretch  hip,   manual and modalities for pain    Consulted and Agree with Plan of Care  Patient       Patient will benefit from skilled therapeutic intervention in order to improve the following deficits and impairments:  Pain, Postural dysfunction, Decreased strength, Decreased activity tolerance, Improper body mechanics, Increased muscle spasms  Visit Diagnosis: Chronic bilateral low back pain, unspecified whether sciatica present  Abnormal posture  Muscle spasm of back  Muscle weakness (generalized)     Problem List Patient Active Problem List   Diagnosis Date Noted  . Polyarthralgia 12/28/2018  . Preventative health care 10/20/2018  . RLS (restless legs syndrome) 09/27/2018  . Insomnia disorder related to known organic factor 06/27/2018  . PLMD (periodic limb movement disorder) 06/27/2018  . Hot flashes due to menopause 04/25/2018  . Heart palpitations 04/25/2018  . Diaphoresis 04/25/2018  . Long term current use of antithrombotics/antiplatelets 04/19/2018  . Insomnia 04/19/2018  . Intractable episodic headache 02/28/2018  . Numbness and tingling of right arm 02/28/2018  . Toe pain, right 10/19/2017  . Rotator cuff arthropathy of left shoulder  06/02/2017  . Trigger thumb of left hand 06/02/2017  . Lipoma 05/06/2017  . Pain of left thumb 05/06/2017  . Chronic pain syndrome 12/15/2016  . Lumbar degenerative disc disease 12/15/2016  . Left leg pain 01/28/2016  . Left shoulder pain 04/09/2015  .  Impaired glucose tolerance 07/30/2013  . Abnormal breath sounds 07/30/2013  . Left carotid stenosis 05/05/2012  . Anxiety 04/03/2011  . CVA (cerebral infarction) 03/15/2011  . HLD (hyperlipidemia) 11/03/2010  . Hx of adenomatous colonic polyps 02/09/2008  . Hypothyroidism 11/06/2007  . ALLERGIC RHINITIS 11/06/2007  . OSTEOPOROSIS 11/06/2007  . ALCOHOL ABUSE, HX OF 11/06/2007  . Essential hypertension 09/13/2007  . Asthma 09/13/2007  . PERIMENOPAUSAL STATUS 09/13/2007  . Chronic insomnia 09/13/2007    Darrel Hoover  PT 02/14/2019, 12:32 PM  [facility] [address] [place], [place], [address] Phone: [phone]   Fax:  [phone]  Name: [name] [name] MRN: [id number] Date of Birth: 11/11/1951

## 2019-02-21 ENCOUNTER — Ambulatory Visit: Payer: Medicare Other

## 2019-02-21 DIAGNOSIS — G8929 Other chronic pain: Secondary | ICD-10-CM

## 2019-02-21 DIAGNOSIS — M545 Low back pain, unspecified: Secondary | ICD-10-CM

## 2019-02-21 DIAGNOSIS — M62830 Muscle spasm of back: Secondary | ICD-10-CM

## 2019-02-21 DIAGNOSIS — R293 Abnormal posture: Secondary | ICD-10-CM

## 2019-02-21 DIAGNOSIS — M6281 Muscle weakness (generalized): Secondary | ICD-10-CM

## 2019-02-21 NOTE — Therapy (Signed)
[place], [place], [address] Phone: [phone]   Fax:  [phone]  Physical Therapy Treatment  Patient Details  Name: [name] [name] MRN: [id number] Date of Birth: 09/03/1951 Referring Provider (PT): Hulan Saas , DO   Encounter Date: 02/21/2019  PT End of Session - 02/21/19 1104    Visit Number  2    Number of Visits  12    Date for PT Re-Evaluation  03/24/19    Authorization Type  [employer]HC MCR    PT Start Time  1100    PT Stop Time  1145    PT Time Calculation (min)  45 min    Activity Tolerance  Patient tolerated treatment well;Patient limited by pain    Behavior During Therapy  [facility] for tasks assessed/performed       Past Medical History:  Diagnosis Date  . ALCOHOL ABUSE, HX OF 11/06/2007  . ALLERGIC RHINITIS 11/06/2007  . Anxiety 04/03/2011  . ASTHMA 09/13/2007  . Chronic pain syndrome 12/15/2016  . COLONIC POLYPS, HX OF 02/09/2008    ADENOMATOUS POLYP  . Encounter for well adult exam without abnormal findings 04/03/2011  . HYPERLIPIDEMIA 11/03/2010  . HYPERTENSION 09/13/2007  . HYPOTHYROIDISM 11/06/2007  . Impaired glucose tolerance 07/30/2013  . INSOMNIA, HX OF 09/13/2007  . Lumbar degenerative disc disease 12/15/2016  . OSTEOPOROSIS 11/06/2007  . PERIMENOPAUSAL STATUS 09/13/2007  . RLS (restless legs syndrome)   . Stroke ([place])   . TIA (transient ischemic attack) 11/04/2011    Past Surgical History:  Procedure Laterality Date  . BREAST EXCISIONAL BIOPSY     left  . BREAST SURGERY  2008 and 2012   x 2 - benign, left side  . carotid artery surgery Left   . CESAREAN SECTION     x 3  . COLONOSCOPY  multiple   2010    There were no vitals filed for this visit.  Subjective Assessment - 02/21/19 1109    Subjective  No pain today    Currently in Pain?  No/denies                       [facility] Adult PT Treatment/Exercise - 02/21/19 0001      Self-Care   Self-Care  Other  Self-Care Comments    Other Self-Care Comments   Discussed need for conditioning and  core strength and how flexibility and strength to help decr pain      Exercises   Exercises  Lumbar      Lumbar Exercises: Stretches   Single Knee to Chest Stretch  Right;Left;2 reps;20 seconds    Pelvic Tilt  10 reps      Lumbar Exercises: Aerobic   Recumbent Bike  L1   5  min      Lumbar Exercises: Supine   Ab Set  10 reps    AB Set Limitations  verbal  and tactile cuesing    Pelvic Tilt  15 reps    Pelvic Tilt Limitations  verbal and     Bent Knee Raise  10 reps    Bridge  10 reps      Modalities   Modalities  Moist Heat      Moist Heat Therapy   Number Minutes Moist Heat  --   during session   Moist Heat Location  Lumbar Spine             PT Education - 02/21/19 1143  Education Details  Benefits of  strength and stretching,  HEP    Person(s) Educated  Patient    Methods  Explanation;Demonstration;Tactile cues;Verbal cues;Handout    Comprehension  Verbalized understanding;Returned demonstration       PT Short Term Goals - 02/14/19 1228      PT SHORT TERM GOAL #1   Title  She will be independent with initial HEP     Time  3    Period  Weeks    Status  New      PT SHORT TERM GOAL #2   Title  She will report pain gnerally decreased 25% or mre    Time  3    Period  Weeks    Status  New        PT Long Term Goals - 02/14/19 1229      PT LONG TERM GOAL #1   Title  She will be indpendent with all hEP issued    Time  6    Period  Weeks    Status  New      PT LONG TERM GOAL #2   Title  She will report her pain as intermittant    Time  6    Period  Weeks    Status  New      PT LONG TERM GOAL #3   Title  She will report able to perform normal home tasks with 50% decreased pain.    Time  6    Period  Weeks    Status  New      PT LONG TERM GOAL #4   Title  FOTO score will improve to 33% limited or better    Time  6    Period  Weeks    Status  New      PT  LONG TERM GOAL #5   Title  She will report that bad episodes will decreased 50% by end of 6 weeks    Time  6    Period  Weeks    Status  New            Plan - 02/21/19 1109    Clinical Impression Statement  She aggreed to HEp . She was slow to catch on but wa able to do then by end of session. She needs some lumbar support to do exercises more comfortably.       PT Treatment/Interventions  Moist Heat;Therapeutic activities;Therapeutic exercise;Patient/family education;Manual techniques;Passive range of motion;Taping;Dry needling    PT Next Visit Plan  start flexion based exercise,                stretch  hip           ,   manual and modalities for pain    PT Home Exercise Plan  LTR,  PPT , bridge, bent knee raise,     Consulted and Agree with Plan of Care  Patient       Patient will benefit from skilled therapeutic intervention in order to improve the following deficits and impairments:  Pain, Postural dysfunction, Decreased strength, Decreased activity tolerance, Improper body mechanics, Increased muscle spasms  Visit Diagnosis: Chronic bilateral low back pain, unspecified whether sciatica present  Abnormal posture  Muscle weakness (generalized)  Muscle spasm of back     Problem List Patient Active Problem List   Diagnosis Date Noted  . Polyarthralgia 12/28/2018  . Preventative health care 10/20/2018  . RLS (restless legs syndrome) 09/27/2018  . Insomnia disorder  related to known organic factor 06/27/2018  . PLMD (periodic limb movement disorder) 06/27/2018  . Hot flashes due to menopause 04/25/2018  . Heart palpitations 04/25/2018  . Diaphoresis 04/25/2018  . Long term current use of antithrombotics/antiplatelets 04/19/2018  . Insomnia 04/19/2018  . Intractable episodic headache 02/28/2018  . Numbness and tingling of right arm 02/28/2018  . Toe pain, right 10/19/2017  . Rotator cuff arthropathy of left shoulder 06/02/2017  . Trigger thumb of left hand 06/02/2017   . Lipoma 05/06/2017  . Pain of left thumb 05/06/2017  . Chronic pain syndrome 12/15/2016  . Lumbar degenerative disc disease 12/15/2016  . Left leg pain 01/28/2016  . Left shoulder pain 04/09/2015  . Impaired glucose tolerance 07/30/2013  . Abnormal breath sounds 07/30/2013  . Left carotid stenosis 05/05/2012  . Anxiety 04/03/2011  . CVA (cerebral infarction) 03/15/2011  . HLD (hyperlipidemia) 11/03/2010  . Hx of adenomatous colonic polyps 02/09/2008  . Hypothyroidism 11/06/2007  . ALLERGIC RHINITIS 11/06/2007  . OSTEOPOROSIS 11/06/2007  . ALCOHOL ABUSE, HX OF 11/06/2007  . Essential hypertension 09/13/2007  . Asthma 09/13/2007  . PERIMENOPAUSAL STATUS 09/13/2007  . Chronic insomnia 09/13/2007    Darrel Hoover  PT 02/21/2019, 11:50 AM  [facility] [address] [place], [place], [address] Phone: [phone]   Fax:  [phone]  Name: [name] [name] MRN: [id number] Date of Birth: [birth year]

## 2019-02-21 NOTE — Patient Instructions (Signed)
PPT , bent knee lift,  10 reps 2x/day     LTR , single knee to chest x 3 2x/day   30 sec stretch

## 2019-02-22 ENCOUNTER — Other Ambulatory Visit: Payer: Self-pay

## 2019-02-22 ENCOUNTER — Ambulatory Visit: Payer: Medicare Other

## 2019-02-22 DIAGNOSIS — M6281 Muscle weakness (generalized): Secondary | ICD-10-CM

## 2019-02-22 DIAGNOSIS — M545 Low back pain, unspecified: Secondary | ICD-10-CM

## 2019-02-22 DIAGNOSIS — R293 Abnormal posture: Secondary | ICD-10-CM

## 2019-02-22 DIAGNOSIS — M62830 Muscle spasm of back: Secondary | ICD-10-CM

## 2019-02-22 DIAGNOSIS — G8929 Other chronic pain: Secondary | ICD-10-CM

## 2019-02-22 NOTE — Therapy (Addendum)
[place], [place], [address] Phone: [phone]   Fax:  [phone]  Physical Therapy Treatment/Discharge  Patient Details  Name: Anneliese Leblond Scalese MRN: [id number] Date of Birth: 03-Oct-1951 Referring Provider (PT): Hulan Saas , DO   Encounter Date: 02/22/2019  PT End of Session - 02/22/19 1059    Visit Number  3    Number of Visits  12    Date for PT Re-Evaluation  03/24/19    Authorization Type  [employer] MCR    PT Start Time  1100    PT Stop Time  1145    PT Time Calculation (min)  45 min    Activity Tolerance  Patient tolerated treatment well;Patient limited by pain    Behavior During Therapy  [facility] for tasks assessed/performed       Past Medical History:  Diagnosis Date  . ALCOHOL ABUSE, HX OF 11/06/2007  . ALLERGIC RHINITIS 11/06/2007  . Anxiety 04/03/2011  . ASTHMA 09/13/2007  . Chronic pain syndrome 12/15/2016  . COLONIC POLYPS, HX OF 02/09/2008    ADENOMATOUS POLYP  . Encounter for well adult exam without abnormal findings 04/03/2011  . HYPERLIPIDEMIA 11/03/2010  . HYPERTENSION 09/13/2007  . HYPOTHYROIDISM 11/06/2007  . Impaired glucose tolerance 07/30/2013  . INSOMNIA, HX OF 09/13/2007  . Lumbar degenerative disc disease 12/15/2016  . OSTEOPOROSIS 11/06/2007  . PERIMENOPAUSAL STATUS 09/13/2007  . RLS (restless legs syndrome)   . Stroke ([place])   . TIA (transient ischemic attack) 11/04/2011    Past Surgical History:  Procedure Laterality Date  . BREAST EXCISIONAL BIOPSY     left  . BREAST SURGERY  2008 and 2012   x 2 - benign, left side  . carotid artery surgery Left   . CESAREAN SECTION     x 3  . COLONOSCOPY  multiple   2010    There were no vitals filed for this visit.  Subjective Assessment - 02/22/19 1109    Subjective  Sore post exercise but eased by evening . Took muscle relaxor for sleep benefits.     Pain Score  5     Pain Location  Back    Pain Orientation  --   central    Multiple Pain Sites  Yes    Pain Score  4    Pain Location  Knee    Pain Orientation  Left    Pain Descriptors / Indicators  Aching    Pain Type  Chronic pain    Pain Onset  More than a month ago    Pain Frequency  Intermittent    Aggravating Factors   bending , on feet long pweriod    Pain Relieving Factors  rest , med                       [facility] Adult PT Treatment/Exercise - 02/22/19 0001      Lumbar Exercises: Stretches   Single Knee to Chest Stretch  Right;Left;2 reps;20 seconds      Lumbar Exercises: Aerobic   Nustep  L4 UE and LE      Lumbar Exercises: Standing   Row  --    Theraband Level (Row)  --    Shoulder Extension  --    Theraband Level (Shoulder Extension)  --      Lumbar Exercises: Supine   Bent Knee Raise  15 reps    Bridge Limitations  12 reps    Bridge with  Ball Squeeze Limitations  glute set with ball squeeze x 12    Bridge with [employer] Limitations  glut squeeze with clam red band  x 12     Straight Leg Raise  10 reps    Straight Leg Raises Limitations  RT/Lt       Lumbar Exercises: Sidelying   Clam  Right;Left    Clam Limitations  12 reps  then 10 reps reverse clams    Hip Abduction  Right;Left   8 reps     Moist Heat Therapy   Number Minutes Moist Heat  --   during session   Moist Heat Location  Lumbar Spine      Manual Therapy   Manual Therapy  Soft tissue mobilization    Soft tissue mobilization  To lumbar and thoracic spine . more tnder L4L5 but Gr 2 PA's also don             PT Education - 02/22/19 1140    Education Details  Expectaitons of muscle soreness but stop if pain sharp or in joint    Person(s) Educated  Patient    Methods  Explanation    Comprehension  Verbalized understanding       PT Short Term Goals - 02/14/19 1228      PT SHORT TERM GOAL #1   Title  She will be independent with initial HEP     Time  3    Period  Weeks    Status  New      PT SHORT TERM GOAL #2   Title  She will report  pain gnerally decreased 25% or mre    Time  3    Period  Weeks    Status  New        PT Long Term Goals - 02/14/19 1229      PT LONG TERM GOAL #1   Title  She will be indpendent with all hEP issued    Time  6    Period  Weeks    Status  New      PT LONG TERM GOAL #2   Title  She will report her pain as intermittant    Time  6    Period  Weeks    Status  New      PT LONG TERM GOAL #3   Title  She will report able to perform normal home tasks with 50% decreased pain.    Time  6    Period  Weeks    Status  New      PT LONG TERM GOAL #4   Title  FOTO score will improve to 33% limited or better    Time  6    Period  Weeks    Status  New      PT LONG TERM GOAL #5   Title  She will report that bad episodes will decreased 50% by end of 6 weeks    Time  6    Period  Weeks    Status  New            Plan - 02/22/19 1100    Clinical Impression Statement  Sore from exercise but no increaed pain and she reported less back pain post manual    PT Treatment/Interventions  Moist Heat;Therapeutic activities;Therapeutic exercise;Patient/family education;Manual techniques;Passive range of motion;Taping;Dry needling    PT Next Visit Plan   flexion based exercise,  stretch  hip           ,   manual and modalities for pain  Add to HEP    PT Home Exercise Plan  LTR,  PPT , bridge, bent knee raise,     Consulted and Agree with Plan of Care  Patient       Patient will benefit from skilled therapeutic intervention in order to improve the following deficits and impairments:  Pain, Postural dysfunction, Decreased strength, Decreased activity tolerance, Improper body mechanics, Increased muscle spasms  Visit Diagnosis: Chronic bilateral low back pain, unspecified whether sciatica present  Abnormal posture  Muscle weakness (generalized)  Muscle spasm of back     Problem List Patient Active Problem List   Diagnosis Date Noted  . Polyarthralgia 12/28/2018  .  Preventative health care 10/20/2018  . RLS (restless legs syndrome) 09/27/2018  . Insomnia disorder related to known organic factor 06/27/2018  . PLMD (periodic limb movement disorder) 06/27/2018  . Hot flashes due to menopause 04/25/2018  . Heart palpitations 04/25/2018  . Diaphoresis 04/25/2018  . Long term current use of antithrombotics/antiplatelets 04/19/2018  . Insomnia 04/19/2018  . Intractable episodic headache 02/28/2018  . Numbness and tingling of right arm 02/28/2018  . Toe pain, right 10/19/2017  . Rotator cuff arthropathy of left shoulder 06/02/2017  . Trigger thumb of left hand 06/02/2017  . Lipoma 05/06/2017  . Pain of left thumb 05/06/2017  . Chronic pain syndrome 12/15/2016  . Lumbar degenerative disc disease 12/15/2016  . Left leg pain 01/28/2016  . Left shoulder pain 04/09/2015  . Impaired glucose tolerance 07/30/2013  . Abnormal breath sounds 07/30/2013  . Left carotid stenosis 05/05/2012  . Anxiety 04/03/2011  . CVA (cerebral infarction) 03/15/2011  . HLD (hyperlipidemia) 11/03/2010  . Hx of adenomatous colonic polyps 02/09/2008  . Hypothyroidism 11/06/2007  . ALLERGIC RHINITIS 11/06/2007  . OSTEOPOROSIS 11/06/2007  . ALCOHOL ABUSE, HX OF 11/06/2007  . Essential hypertension 09/13/2007  . Asthma 09/13/2007  . PERIMENOPAUSAL STATUS 09/13/2007  . Chronic insomnia 09/13/2007    Darrel Hoover  PT 02/22/2019, 11:42 AM  [facility] [address] [place], [place], [address] Phone: [phone]   Fax:  [phone]  Name: ORPHIA MCTIGUE MRN: [id number] Date of Birth: July 19, 1951  PHYSICAL THERAPY DISCHARGE SUMMARY  Visits from Start of Care: 3 Current functional level related to goals / functional outcomes: Unknown as she canceled all appointments due to covid-19 and has not contacted [place] to return  We will be glad to see her back if ordered   Remaining deficits: Unknown   Education /  Equipment: HEP Plan:                                                    Patient goals were not met. Patient is being discharged due to not returning since the last visit.  ?????    Pearson Forster PT  05/23/19

## 2019-02-27 ENCOUNTER — Ambulatory Visit: Payer: Medicare Other | Admitting: Physical Therapy

## 2019-02-28 ENCOUNTER — Ambulatory Visit: Payer: Medicare Other

## 2019-03-01 ENCOUNTER — Other Ambulatory Visit: Payer: Self-pay | Admitting: Family Medicine

## 2019-03-28 ENCOUNTER — Ambulatory Visit: Payer: Medicare Other | Admitting: Family Medicine

## 2019-04-11 ENCOUNTER — Other Ambulatory Visit: Payer: Self-pay | Admitting: Internal Medicine

## 2019-04-11 MED ORDER — ZOLPIDEM TARTRATE 10 MG PO TABS: 10.0000 mg | ORAL_TABLET | Freq: Every evening | ORAL | 1 refills | Status: DC | PRN

## 2019-04-20 ENCOUNTER — Ambulatory Visit (INDEPENDENT_AMBULATORY_CARE_PROVIDER_SITE_OTHER): Payer: Medicare Other | Admitting: Internal Medicine

## 2019-04-20 ENCOUNTER — Encounter: Payer: Self-pay | Admitting: Internal Medicine

## 2019-04-20 DIAGNOSIS — Z0000 Encounter for general adult medical examination without abnormal findings: Secondary | ICD-10-CM | POA: Diagnosis not present

## 2019-04-20 DIAGNOSIS — R7302 Impaired glucose tolerance (oral): Secondary | ICD-10-CM | POA: Diagnosis not present

## 2019-04-20 DIAGNOSIS — G894 Chronic pain syndrome: Secondary | ICD-10-CM | POA: Diagnosis not present

## 2019-04-20 MED ORDER — TRAMADOL HCL 50 MG PO TABS: 50.0000 mg | ORAL_TABLET | Freq: Three times a day (TID) | ORAL | 5 refills | Status: DC | PRN

## 2019-04-20 MED ORDER — LEVOTHYROXINE SODIUM 75 MCG PO TABS: 75.0000 ug | ORAL_TABLET | Freq: Every day | ORAL | 2 refills | Status: DC

## 2019-04-20 NOTE — Assessment & Plan Note (Signed)
stable overall by history and exam, recent data reviewed with pt, and pt to continue medical treatment as before,  to f/u any worsening symptoms or concerns, for a1c with labs 

## 2019-04-20 NOTE — Patient Instructions (Signed)

## 2019-04-20 NOTE — Assessment & Plan Note (Signed)
Ok for tramadol prn,  to f/u any worsening symptoms or concerns  

## 2019-04-20 NOTE — Assessment & Plan Note (Signed)

## 2019-04-20 NOTE — Progress Notes (Signed)
Patient ID: [name] [name], female   DOB: Oct 08, 1951, 68 y.o.   MRN: [id number]  Virtual Visit via Video Note  I connected with [name] [name] on 04/20/19 at  9:40 AM EDT by a video enabled telemedicine application and verified that I am speaking with the correct person using two identifiers.  Location: Patient: at home, no others present Provider: at office   I discussed the limitations of evaluation and management by telemedicine and the availability of in person appointments. The patient expressed understanding and agreed to proceed.  History of Present Illness: Here for wellness and f/u;  Overall doing ok;  Pt denies Chest pain, worsening SOB, DOE, wheezing, orthopnea, PND, worsening LE edema, palpitations, dizziness or syncope.  Pt denies neurological change such as new headache, facial or extremity weakness.  Pt denies polydipsia, polyuria, or low sugar symptoms. Pt states overall good compliance with treatment and medications, good tolerability, and has been trying to follow appropriate diet.  Pt denies worsening depressive symptoms, suicidal ideation or panic. No fever, night sweats, wt loss, loss of appetite, or other constitutional symptoms.  Pt states good ability with ADL's, has low fall risk, home safety reviewed and adequate, no other significant changes in hearing or vision, and only occasionally active with exercise.  Pt continues to have recurring LBP without change in severity, bowel or bladder change, fever, wt loss,  worsening LE pain/numbness/weakness, gait change or falls.  BP has been < 140/90 at home.  Has been able to lose wt intentionally to 130 and overall feeling better Past Medical History:  Diagnosis Date  . ALCOHOL ABUSE, HX OF 11/06/2007  . ALLERGIC RHINITIS 11/06/2007  . Anxiety 04/03/2011  . ASTHMA 09/13/2007  . Chronic pain syndrome 12/15/2016  . COLONIC POLYPS, HX OF 02/09/2008    ADENOMATOUS POLYP  . Encounter for well adult exam without abnormal findings  04/03/2011  . HYPERLIPIDEMIA 11/03/2010  . HYPERTENSION 09/13/2007  . HYPOTHYROIDISM 11/06/2007  . Impaired glucose tolerance 07/30/2013  . INSOMNIA, HX OF 09/13/2007  . Lumbar degenerative disc disease 12/15/2016  . OSTEOPOROSIS 11/06/2007  . PERIMENOPAUSAL STATUS 09/13/2007  . RLS (restless legs syndrome)   . Stroke ([place])   . TIA (transient ischemic attack) 11/04/2011   Past Surgical History:  Procedure Laterality Date  . BREAST EXCISIONAL BIOPSY     left  . BREAST SURGERY  2008 and 2012   x 2 - benign, left side  . carotid artery surgery Left   . CESAREAN SECTION     x 3  . COLONOSCOPY  multiple   2010    reports that she has never smoked. She has never used smokeless tobacco. She reports that she does not drink alcohol or use drugs. family history includes Breast cancer in her cousin; Coronary artery disease in an other family member; Dementia in her maternal grandmother; Heart disease in her mother; Hyperlipidemia in an other family member; Hypertension in her mother and another family member. Allergies  Allergen Reactions  . Fosamax [Alendronate Sodium] Nausea And Vomiting  . Penicillins Hives   Current Outpatient Medications on File Prior to Visit  Medication Sig Dispense Refill  . acetaminophen (TYLENOL) 325 MG tablet Take 650 mg by mouth every 6 (six) hours as needed.      Marland Kitchen atorvastatin (LIPITOR) 40 MG tablet Take 1 tablet (40 mg total) by mouth daily. 90 tablet 3  . benazepril-hydrochlorthiazide (LOTENSIN HCT) 10-12.5 MG tablet Take 1 tablet by mouth daily. 90 tablet 3  . clopidogrel (  PLAVIX) 75 MG tablet TAJE 1 TABLET BY MOUTH DAILY. 90 tablet 1  . gabapentin (NEURONTIN) 100 MG capsule TAKE 2 CAPSULES (200 MG TOTAL) BY MOUTH AT BEDTIME. 180 capsule 2  . levalbuterol (XOPENEX HFA) 45 MCG/ACT inhaler Inhale 2 puffs into the lungs 4 (four) times daily. 1 Inhaler 12  . montelukast (SINGULAIR) 10 MG tablet 1 tab by mouth daily 90 tablet 3  . potassium chloride (MICRO-K) 10  MEQ CR capsule TAKE 1 TABLET BY MOUTH DAILY. 90 capsule 3  . rOPINIRole (REQUIP) 0.5 MG tablet Take 0.5 tablets (0.25 mg total) by mouth at bedtime. 30 tablet 5  . zolpidem (AMBIEN) 10 MG tablet Take 1 tablet (10 mg total) by mouth at bedtime as needed for up to 30 days for sleep. 90 tablet 1   No current facility-administered medications on file prior to visit.     Observations/Objective: Alert, NAD, appropriate mood and affect, resps normal, cn 2-12 intact, moves all 4s, no visible rash or swelling  Lab Results  Component Value Date   WBC 5.9 04/19/2018   HGB 13.7 04/19/2018   HCT 41.2 04/19/2018   PLT 222.0 04/19/2018   GLUCOSE 100 (H) 10/20/2018   CHOL 179 10/20/2018   TRIG 83.0 10/20/2018   HDL 63.30 10/20/2018   LDLDIRECT 127.1 10/24/2010   LDLCALC 99 10/20/2018   ALT 13 10/20/2018   AST 14 10/20/2018   NA 141 10/20/2018   K 3.4 (L) 10/20/2018   CL 103 10/20/2018   CREATININE 0.79 10/20/2018   BUN 14 10/20/2018   CO2 30 10/20/2018   TSH 1.48 10/20/2018   INR 1.03 03/04/2011   HGBA1C 6.2 10/20/2018   Assessment and Plan: See notes  Follow Up Instructions: See notes   I discussed the assessment and treatment plan with the patient. The patient was provided an opportunity to ask questions and all were answered. The patient agreed with the plan and demonstrated an understanding of the instructions.   The patient was advised to call back or seek an in-person evaluation if the symptoms worsen or if the condition fails to improve as anticipated.  [name], MD

## 2019-04-30 NOTE — Progress Notes (Signed)
[name] [place] [place], [place] [address] Phone: [phone] Subjective:   [name] [name], am serving as a scribe for Dr. Hulan Saas.   CC: back pain   DDU:[id number]   02/07/2019:  Moderate to severe.  Will send patient to physical therapy.  Hopefully this will be beneficial.  Discussed posture and ergonomics.  Discussed which activities to do which was to avoid.  Patient is to increase activity slowly over the course the next several days.  Follow-up with me again in 6 to 8 weeks changes in medicines per AVS  Update 05/01/2019: [name] [name] is a 68 y.o. female coming in with complaint of back pain. Patient states that her pain has become intense for the past few weeks. Did go to physical therapy. Feels that therapy increased her pain. Pain travels across the lower back.  Discontinued physical therapy on her own.  Patient denies any significant radiation down the legs but sometimes feels like her legs are weak.  Rates the severity of pain is 9 out of 10.    Past Medical History:  Diagnosis Date  . ALCOHOL ABUSE, HX OF 11/06/2007  . ALLERGIC RHINITIS 11/06/2007  . Anxiety 04/03/2011  . ASTHMA 09/13/2007  . Chronic pain syndrome 12/15/2016  . COLONIC POLYPS, HX OF 02/09/2008    ADENOMATOUS POLYP  . Encounter for well adult exam without abnormal findings 04/03/2011  . HYPERLIPIDEMIA 11/03/2010  . HYPERTENSION 09/13/2007  . HYPOTHYROIDISM 11/06/2007  . Impaired glucose tolerance 07/30/2013  . INSOMNIA, HX OF 09/13/2007  . Lumbar degenerative disc disease 12/15/2016  . OSTEOPOROSIS 11/06/2007  . PERIMENOPAUSAL STATUS 09/13/2007  . RLS (restless legs syndrome)   . Stroke ([place])   . TIA (transient ischemic attack) 11/04/2011   Past Surgical History:  Procedure Laterality Date  . BREAST EXCISIONAL BIOPSY     left  . BREAST SURGERY  2008 and 2012   x 2 - benign, left side  . carotid artery surgery Left   . CESAREAN SECTION     x 3  .  COLONOSCOPY  multiple   2010   Social History   Socioeconomic History  . Marital status: Divorced    Spouse name: Not on file  . Number of children: 3  . Years of education: Not on file  . Highest education level: Not on file  Occupational History  . Occupation: Nurse, mental health: [employer]  Social Needs  . Financial resource strain: Not on file  . Food insecurity:    Worry: Not on file    Inability: Not on file  . Transportation needs:    Medical: Not on file    Non-medical: Not on file  Tobacco Use  . Smoking status: Never Smoker  . Smokeless tobacco: Never Used  Substance and Sexual Activity  . Alcohol use: [name]  . Drug use: [name]  . Sexual activity: Not on file  Lifestyle  . Physical activity:    Days per week: Not on file    Minutes per session: Not on file  . Stress: Not on file  Relationships  . Social connections:    Talks on phone: Not on file    Gets together: Not on file    Attends religious service: Not on file    Active member of club or organization: Not on file    Attends meetings of clubs or organizations: Not on file    Relationship status: Not on file  Other Topics Concern  . 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 Fosamax [Alendronate Sodium] Nausea And Vomiting  . Penicillins Hives   Family History  Problem Relation Age of Onset  . Hyperlipidemia Other   . Hypertension Other   . Coronary artery disease Other   . Breast cancer Cousin   . Hypertension Mother   . Heart disease Mother   . Dementia Maternal Grandmother   . Colon cancer Neg Hx     Current Outpatient Medications (Endocrine & Metabolic):  .  levothyroxine (SYNTHROID) 75 MCG tablet, Take 1 tablet (75 mcg total) by mouth daily before breakfast.  Current Outpatient Medications  (Cardiovascular):  .  atorvastatin (LIPITOR) 40 MG tablet, Take 1 tablet (40 mg total) by mouth daily. .  benazepril-hydrochlorthiazide (LOTENSIN HCT) 10-12.5 MG tablet, Take 1 tablet by mouth daily.  Current Outpatient Medications (Respiratory):  .  levalbuterol (XOPENEX HFA) 45 MCG/ACT inhaler, Inhale 2 puffs into the lungs 4 (four) times daily. .  montelukast (SINGULAIR) 10 MG tablet, 1 tab by mouth daily  Current Outpatient Medications (Analgesics):  .  acetaminophen (TYLENOL) 325 MG tablet, Take 650 mg by mouth every 6 (six) hours as needed.   .  traMADol (ULTRAM) 50 MG tablet, Take 1 tablet (50 mg total) by mouth every 8 (eight) hours as needed.  Current Outpatient Medications (Hematological):  .  clopidogrel (PLAVIX) 75 MG tablet, TAJE 1 TABLET BY MOUTH DAILY.  Current Outpatient Medications (Other):  .  gabapentin (NEURONTIN) 100 MG capsule, TAKE 2 CAPSULES (200 MG TOTAL) BY MOUTH AT BEDTIME. Marland Kitchen  potassium chloride (MICRO-K) 10 MEQ CR capsule, TAKE 1 TABLET BY MOUTH DAILY. Marland Kitchen  rOPINIRole (REQUIP) 0.5 MG tablet, Take 0.5 tablets (0.25 mg total) by mouth at bedtime. Marland Kitchen  zolpidem (AMBIEN) 10 MG tablet, Take 1 tablet (10 mg total) by mouth at bedtime as needed for up to 30 days for sleep.    Past medical history, social, surgical and family history all reviewed in electronic medical record.  [name] pertanent information unless stated regarding to the chief complaint.   Review of Systems:  [name] headache, visual changes, nausea, vomiting, diarrhea, constipation, dizziness, abdominal pain, skin rash, fevers, chills, night sweats, weight loss, swollen lymph nodes, , chest pain, shortness of breath, mood changes.  Positive muscle aches and body aches  Objective  Blood pressure 122/84, pulse 78, height 4\' 11"  (1.499 m), weight 137 lb (62.1 kg), SpO2 98 %.    General: [name] apparent distress alert and oriented x3 mood and affect normal, dressed appropriately.  HEENT: Pupils equal, extraocular  movements intact  Respiratory: Patient's speak in full sentences and does not appear short of breath  Cardiovascular: [name] lower extremity edema, non tender, [name] erythema  Skin: Warm dry intact with [name] signs of infection or rash on extremities or on axial skeleton.  Abdomen: Soft nontender  Neuro: Cranial nerves II through XII are intact, neurovascularly intact in all extremities with 2+ DTRs and 2+ pulses.  Lymph: [name] lymphadenopathy of posterior or anterior cervical chain or axillae bilaterally.  Gait normal with good balance and coordination.  MSK:  tender with full range of motion and good stability and symmetric strength and tone of shoulders, elbows, wrist, hip, knee and ankles bilaterally.  Arthritic changes of multiple  joints Back Exam:  Inspection: Loss of lordosis Motion: Flexion 30 deg worsening pain, Extension 25 deg, Side Bending to 25 deg bilaterally,  Rotation to 30 deg bilaterally  SLR laying: Negative  XSLR laying: Negative  Palpable tenderness: Tender to palpation of paraspinal musculature lumbar spine.  More pain over the sacroiliac joints bilaterally FABER: Positive Faber bilaterally Sensory change: Gross sensation intact to all lumbar and sacral dermatomes.  Reflexes: 2+ at both patellar tendons, 2+ at achilles tendons, Babinski's downgoing.   Leg strength  4 out of 5 but symmetric  Procedure: Real-time Ultrasound Guided Injection of left sacroiliac joint Device: GE Logiq Q7 Ultrasound guided injection is preferred based studies that show increased duration, increased effect, greater accuracy, decreased procedural pain, increased response rate, and decreased cost with ultrasound guided versus blind injection.  Verbal informed consent obtained.  Time-out conducted.  Noted [name] overlying erythema, induration, or other signs of local infection.  Skin prepped in a sterile fashion.  Local anesthesia: Topical Ethyl chloride.  With sterile technique and under real time  ultrasound guidance: With a 21-gauge 3 inch needle patient was injected with 1 cc of 0.5% Marcaine and 1 cc of Kenalog 40 mg/mL into the left sacroiliac joint Completed without difficulty  Pain immediately resolved suggesting accurate placement of the medication.  Advised to call if fevers/chills, erythema, induration, drainage, or persistent bleeding.  Images permanently stored and available for review in the ultrasound unit.  Impression: Technically successful ultrasound guided injection.  Procedure: Real-time Ultrasound Guided Injection of right sacroiliac joint Device: GE Logiq Q7 Ultrasound guided injection is preferred based studies that show increased duration, increased effect, greater accuracy, decreased procedural pain, increased response rate, and decreased cost with ultrasound guided versus blind injection.  Verbal informed consent obtained.  Time-out conducted.  Noted [name] overlying erythema, induration, or other signs of local infection.  Skin prepped in a sterile fashion.  Local anesthesia: Topical Ethyl chloride.  With sterile technique and under real time ultrasound guidance: With a 21-gauge 3 inch needle patient was injected with 1 cc of Kenalog 40 mg/mL into the right sacroiliac joint" l Completed without difficulty   Pain immediately resolved suggesting accurate placement of the medication.  Advised to call if fevers/chills, erythema, induration, drainage, or persistent bleeding.  Images permanently stored and I made a disc available for review in the ultrasound unit.  Impression: Technically successful ultrasound guided injection.   Impression and Recommendations:     This case required medical decision making of moderate complexity. The above documentation has been reviewed and is accurate and complete [name] Pulley, DO       Note: This dictation was prepared with Dragon dictation along with smaller phrase technology. Any transcriptional errors that result from  this process are unintentional.

## 2019-05-01 ENCOUNTER — Encounter: Payer: Self-pay | Admitting: Family Medicine

## 2019-05-01 ENCOUNTER — Ambulatory Visit (INDEPENDENT_AMBULATORY_CARE_PROVIDER_SITE_OTHER): Payer: Medicare Other | Admitting: Family Medicine

## 2019-05-01 ENCOUNTER — Other Ambulatory Visit (INDEPENDENT_AMBULATORY_CARE_PROVIDER_SITE_OTHER): Payer: Medicare Other

## 2019-05-01 ENCOUNTER — Ambulatory Visit: Payer: Self-pay

## 2019-05-01 ENCOUNTER — Other Ambulatory Visit: Payer: Self-pay

## 2019-05-01 VITALS — BP 122/84 | HR 78 | Ht 59.0 in | Wt 137.0 lb

## 2019-05-01 DIAGNOSIS — M533 Sacrococcygeal disorders, not elsewhere classified: Secondary | ICD-10-CM | POA: Diagnosis not present

## 2019-05-01 DIAGNOSIS — Z0000 Encounter for general adult medical examination without abnormal findings: Secondary | ICD-10-CM | POA: Diagnosis not present

## 2019-05-01 DIAGNOSIS — G8929 Other chronic pain: Secondary | ICD-10-CM | POA: Diagnosis not present

## 2019-05-01 DIAGNOSIS — M47818 Spondylosis without myelopathy or radiculopathy, sacral and sacrococcygeal region: Secondary | ICD-10-CM | POA: Diagnosis not present

## 2019-05-01 DIAGNOSIS — R7302 Impaired glucose tolerance (oral): Secondary | ICD-10-CM

## 2019-05-01 LAB — CBC WITH DIFFERENTIAL/PLATELET
Basophils Absolute: 0 10*3/uL (ref 0.0–0.1)
Basophils Relative: 0.3 % (ref 0.0–3.0)
Eosinophils Absolute: 0.1 10*3/uL (ref 0.0–0.7)
Eosinophils Relative: 1.7 % (ref 0.0–5.0)
HCT: 38.8 % (ref 36.0–46.0)
Hemoglobin: 13.2 g/dL (ref 12.0–15.0)
Lymphocytes Relative: 42.9 % (ref 12.0–46.0)
Lymphs Abs: 1.9 10*3/uL (ref 0.7–4.0)
MCHC: 34.1 g/dL (ref 30.0–36.0)
MCV: 92.5 fl (ref 78.0–100.0)
Monocytes Absolute: 0.4 10*3/uL (ref 0.1–1.0)
Monocytes Relative: 9.1 % (ref 3.0–12.0)
Neutro Abs: 2.1 10*3/uL (ref 1.4–7.7)
Neutrophils Relative %: 46 % (ref 43.0–77.0)
Platelets: 198 10*3/uL (ref 150.0–400.0)
RBC: 4.2 Mil/uL (ref 3.87–5.11)
RDW: 14.8 % (ref 11.5–15.5)
WBC: 4.5 10*3/uL (ref 4.0–10.5)

## 2019-05-01 LAB — TSH: TSH: 1.32 u[IU]/mL (ref 0.35–4.50)

## 2019-05-01 LAB — HEPATIC FUNCTION PANEL
ALT: 10 U/L (ref 0–35)
AST: 12 U/L (ref 0–37)
Albumin: 4.1 g/dL (ref 3.5–5.2)
Alkaline Phosphatase: 101 U/L (ref 39–117)
Bilirubin, Direct: 0.1 mg/dL (ref 0.0–0.3)
Total Bilirubin: 0.6 mg/dL (ref 0.2–1.2)
Total Protein: 6.9 g/dL (ref 6.0–8.3)

## 2019-05-01 LAB — BASIC METABOLIC PANEL
BUN: 9 mg/dL (ref 6–23)
CO2: 29 mEq/L (ref 19–32)
Calcium: 9.1 mg/dL (ref 8.4–10.5)
Chloride: 102 mEq/L (ref 96–112)
Creatinine, Ser: 0.84 mg/dL (ref 0.40–1.20)
GFR: 81.83 mL/min (ref >60.00)
Glucose, Bld: 97 mg/dL (ref 70–99)
Potassium: 3.9 mEq/L (ref 3.5–5.1)
Sodium: 140 mEq/L (ref 135–145)

## 2019-05-01 LAB — LIPID PANEL
Cholesterol: 186 mg/dL (ref 0–200)
HDL: 73.8 mg/dL (ref >39.00)
LDL Cholesterol: 98 mg/dL (ref 0–99)
NonHDL: 112.4
Total CHOL/HDL Ratio: 3
Triglycerides: 74 mg/dL (ref 0.0–149.0)
VLDL: 14.8 mg/dL (ref 0.0–40.0)

## 2019-05-01 LAB — HEMOGLOBIN A1C: Hgb A1c MFr Bld: 6.2 % (ref 4.6–6.5)

## 2019-05-01 NOTE — Assessment & Plan Note (Signed)
Bilateral injections given today.  Patient did have significant amount of decreasing pain immediately.  Patient has had many different health problems including a chronic pain syndrome.  Known degenerative disc disease that is fairly severe in the back.  Still concern more of a lumbar radiculopathy contributing to more of her discomfort and pain.  We will talk about an MRI and epidurals after this.  Patient is in agreement with the plan.  Follow-up again in 3 to 4 weeks

## 2019-05-01 NOTE — Patient Instructions (Signed)
Good to see you  Tried 2 is injection  Ice is your friend If not better we will need to consider MRI and then other injections.  Be safe

## 2019-05-09 ENCOUNTER — Emergency Department (HOSPITAL_COMMUNITY): Payer: Medicare Other

## 2019-05-09 ENCOUNTER — Other Ambulatory Visit: Payer: Self-pay

## 2019-05-09 ENCOUNTER — Observation Stay (HOSPITAL_COMMUNITY)
Admission: EM | Admit: 2019-05-09 | Discharge: 2019-05-10 | Disposition: A | Discharge status: Home / Self Care | Payer: Medicare Other | Attending: Internal Medicine | Admitting: Internal Medicine

## 2019-05-09 ENCOUNTER — Ambulatory Visit: Payer: Self-pay

## 2019-05-09 ENCOUNTER — Encounter (HOSPITAL_COMMUNITY): Payer: Self-pay

## 2019-05-09 DIAGNOSIS — Z8673 Personal history of transient ischemic attack (TIA), and cerebral infarction without residual deficits: Secondary | ICD-10-CM | POA: Insufficient documentation

## 2019-05-09 DIAGNOSIS — R079 Chest pain, unspecified: Secondary | ICD-10-CM | POA: Diagnosis not present

## 2019-05-09 DIAGNOSIS — E876 Hypokalemia: Secondary | ICD-10-CM | POA: Diagnosis not present

## 2019-05-09 DIAGNOSIS — Z1159 Encounter for screening for other viral diseases: Secondary | ICD-10-CM | POA: Diagnosis not present

## 2019-05-09 DIAGNOSIS — R0789 Other chest pain: Secondary | ICD-10-CM | POA: Diagnosis not present

## 2019-05-09 DIAGNOSIS — G4700 Insomnia, unspecified: Secondary | ICD-10-CM | POA: Diagnosis not present

## 2019-05-09 DIAGNOSIS — Z79890 Hormone replacement therapy (postmenopausal): Secondary | ICD-10-CM | POA: Insufficient documentation

## 2019-05-09 DIAGNOSIS — Z7902 Long term (current) use of antithrombotics/antiplatelets: Secondary | ICD-10-CM | POA: Diagnosis not present

## 2019-05-09 DIAGNOSIS — M810 Age-related osteoporosis without current pathological fracture: Secondary | ICD-10-CM | POA: Insufficient documentation

## 2019-05-09 DIAGNOSIS — F419 Anxiety disorder, unspecified: Secondary | ICD-10-CM | POA: Diagnosis not present

## 2019-05-09 DIAGNOSIS — R072 Precordial pain: Secondary | ICD-10-CM | POA: Diagnosis present

## 2019-05-09 DIAGNOSIS — E039 Hypothyroidism, unspecified: Secondary | ICD-10-CM | POA: Insufficient documentation

## 2019-05-09 DIAGNOSIS — G894 Chronic pain syndrome: Secondary | ICD-10-CM | POA: Diagnosis not present

## 2019-05-09 DIAGNOSIS — E785 Hyperlipidemia, unspecified: Secondary | ICD-10-CM | POA: Insufficient documentation

## 2019-05-09 DIAGNOSIS — J45909 Unspecified asthma, uncomplicated: Secondary | ICD-10-CM | POA: Diagnosis not present

## 2019-05-09 DIAGNOSIS — Z79899 Other long term (current) drug therapy: Secondary | ICD-10-CM | POA: Diagnosis not present

## 2019-05-09 DIAGNOSIS — G2581 Restless legs syndrome: Secondary | ICD-10-CM | POA: Diagnosis not present

## 2019-05-09 DIAGNOSIS — Z8249 Family history of ischemic heart disease and other diseases of the circulatory system: Secondary | ICD-10-CM | POA: Insufficient documentation

## 2019-05-09 DIAGNOSIS — R002 Palpitations: Secondary | ICD-10-CM | POA: Diagnosis present

## 2019-05-09 DIAGNOSIS — I10 Essential (primary) hypertension: Secondary | ICD-10-CM | POA: Insufficient documentation

## 2019-05-09 DIAGNOSIS — G4761 Periodic limb movement disorder: Secondary | ICD-10-CM | POA: Diagnosis not present

## 2019-05-09 LAB — BASIC METABOLIC PANEL
Anion gap: 8 (ref 5–15)
BUN: 20 mg/dL (ref 8–23)
CO2: 25 mmol/L (ref 22–32)
Calcium: 9.2 mg/dL (ref 8.9–10.3)
Chloride: 107 mmol/L (ref 98–111)
Creatinine, Ser: 0.88 mg/dL (ref 0.44–1.00)
GFR calc Af Amer: >60 mL/min (ref >60)
GFR calc non Af Amer: >60 mL/min (ref >60)
Glucose, Bld: 96 mg/dL (ref 70–99)
Potassium: 3.3 mmol/L — ABNORMAL LOW (ref 3.5–5.1)
Sodium: 140 mmol/L (ref 135–145)

## 2019-05-09 LAB — CBC
HCT: 39.1 % (ref 36.0–46.0)
Hemoglobin: 12.9 g/dL (ref 12.0–15.0)
MCH: 30.9 pg (ref 26.0–34.0)
MCHC: 33 g/dL (ref 30.0–36.0)
MCV: 93.5 fL (ref 80.0–100.0)
Platelets: 216 10*3/uL (ref 150–400)
RBC: 4.18 MIL/uL (ref 3.87–5.11)
RDW: 14.2 % (ref 11.5–15.5)
WBC: 6.3 10*3/uL (ref 4.0–10.5)
nRBC: 0 % (ref 0.0–0.2)

## 2019-05-09 LAB — TROPONIN I
Troponin I: <0.03 ng/mL (ref <0.03)
Troponin I: <0.03 ng/mL (ref <0.03)
Troponin I: <0.03 ng/mL (ref <0.03)

## 2019-05-09 LAB — SARS CORONAVIRUS 2 BY RT PCR (HOSPITAL ORDER, PERFORMED IN ~~LOC~~ HOSPITAL LAB): SARS Coronavirus 2: NEGATIVE

## 2019-05-09 MED ORDER — ROPINIROLE HCL 0.25 MG PO TABS
0.2500 mg | ORAL_TABLET | Freq: Every day | ORAL | Status: DC
  Administered 2019-05-09: 0.25 mg via ORAL
  Filled 2019-05-09: qty 1

## 2019-05-09 MED ORDER — BENAZEPRIL-HYDROCHLOROTHIAZIDE 10-12.5 MG PO TABS: 1.0000 | ORAL_TABLET | Freq: Every day | ORAL | Status: DC

## 2019-05-09 MED ORDER — CLOPIDOGREL BISULFATE 75 MG PO TABS
75.0000 mg | ORAL_TABLET | Freq: Every day | ORAL | Status: DC
  Administered 2019-05-09 – 2019-05-10 (×2): 75 mg via ORAL
  Filled 2019-05-09 (×3): qty 1

## 2019-05-09 MED ORDER — BENAZEPRIL HCL 10 MG PO TABS
10.0000 mg | ORAL_TABLET | Freq: Every day | ORAL | Status: DC
  Administered 2019-05-10: 10 mg via ORAL
  Filled 2019-05-09: qty 1

## 2019-05-09 MED ORDER — LEVOTHYROXINE SODIUM 75 MCG PO TABS
75.0000 ug | ORAL_TABLET | Freq: Every day | ORAL | Status: DC
  Administered 2019-05-10: 06:00:00 75 ug via ORAL
  Filled 2019-05-09: qty 1

## 2019-05-09 MED ORDER — ONDANSETRON HCL 4 MG PO TABS: 4.0000 mg | ORAL_TABLET | Freq: Four times a day (QID) | ORAL | Status: DC | PRN

## 2019-05-09 MED ORDER — POTASSIUM CHLORIDE CRYS ER 20 MEQ PO TBCR
40.0000 meq | EXTENDED_RELEASE_TABLET | ORAL | Status: AC
  Administered 2019-05-09 (×2): 40 meq via ORAL
  Filled 2019-05-09 (×2): qty 2

## 2019-05-09 MED ORDER — ATORVASTATIN CALCIUM 40 MG PO TABS
40.0000 mg | ORAL_TABLET | Freq: Every day | ORAL | Status: DC
  Administered 2019-05-10: 40 mg via ORAL
  Filled 2019-05-09: qty 1

## 2019-05-09 MED ORDER — TRAMADOL HCL 50 MG PO TABS: 50.0000 mg | ORAL_TABLET | Freq: Three times a day (TID) | ORAL | Status: DC | PRN

## 2019-05-09 MED ORDER — HYDROCHLOROTHIAZIDE 12.5 MG PO CAPS
12.5000 mg | ORAL_CAPSULE | Freq: Every day | ORAL | Status: DC
  Administered 2019-05-10: 12.5 mg via ORAL
  Filled 2019-05-09: qty 1

## 2019-05-09 MED ORDER — SODIUM CHLORIDE 0.9% FLUSH: 3.0000 mL | Freq: Once | INTRAVENOUS | Status: DC

## 2019-05-09 MED ORDER — LEVALBUTEROL HCL 0.63 MG/3ML IN NEBU: 0.6300 mg | INHALATION_SOLUTION | Freq: Four times a day (QID) | RESPIRATORY_TRACT | Status: DC | PRN

## 2019-05-09 MED ORDER — ACETAMINOPHEN 325 MG PO TABS: 650.0000 mg | ORAL_TABLET | Freq: Four times a day (QID) | ORAL | Status: DC | PRN

## 2019-05-09 MED ORDER — ONDANSETRON HCL 4 MG/2ML IJ SOLN: 4.0000 mg | Freq: Four times a day (QID) | INTRAMUSCULAR | Status: DC | PRN

## 2019-05-09 MED ORDER — ACETAMINOPHEN 650 MG RE SUPP: 650.0000 mg | Freq: Four times a day (QID) | RECTAL | Status: DC | PRN

## 2019-05-09 MED ORDER — POTASSIUM CHLORIDE CRYS ER 10 MEQ PO TBCR
10.0000 meq | EXTENDED_RELEASE_TABLET | Freq: Every day | ORAL | Status: DC
  Administered 2019-05-09: 10 meq via ORAL
  Filled 2019-05-09 (×2): qty 1

## 2019-05-09 MED ORDER — ENOXAPARIN SODIUM 40 MG/0.4ML ~~LOC~~ SOLN
40.0000 mg | SUBCUTANEOUS | Status: DC
  Administered 2019-05-09: 21:00:00 40 mg via SUBCUTANEOUS
  Filled 2019-05-09: qty 0.4

## 2019-05-09 MED ORDER — MONTELUKAST SODIUM 10 MG PO TABS
10.0000 mg | ORAL_TABLET | Freq: Every day | ORAL | Status: DC
  Administered 2019-05-10: 10 mg via ORAL
  Filled 2019-05-09: qty 1

## 2019-05-09 MED ORDER — ZOLPIDEM TARTRATE 10 MG PO TABS
5.0000 mg | ORAL_TABLET | Freq: Every day | ORAL | Status: DC
  Administered 2019-05-09: 5 mg via ORAL
  Filled 2019-05-09: qty 1

## 2019-05-09 NOTE — H&P (Signed)
History and Physical    [name] [name] Soter WTU:[id number] DOB: 03/17/1951 DOA: 05/09/2019  PCP: Biagio Borg, MD   Patient coming from: Home   Chief Complaint:  Chest pain.   HPI: [name] [name] is a 68 y.o. female with medical history significant of asthma, anxiety, hypertension, dyslipidemia and allergic rhinitis.  Patient reports 7 days of intermittent palpitations, non-exertion related, moderate to severe in intensity, self limiting after 5 to 10 minutes, associated with dyspnea and left-sided chest pain which radiated down to her left arm.  There is no apparent triggering factors.  On severe occasions it has been associated with dizziness and lightheadedness.  Last night she had multiple episodes back-to-back, with worsening left-sided chest pain.  She came this morning to the hospital for further evaluation.  She is physically active at home, able to climb steps and do light yard work with no angina.  Denies any PND, orthopnea or lower extremity edema.  Her asthma has been well controlled, tends to exacerbate with seasonal changes.  Over the last weekend she had increased the use of her albuterol inhaler with intention to relief her palpitations and dyspnea but it failed to improve her symptoms.   ED Course: Patient was noted to be chest pain-free, her vitals have been stable, initial work-up for acute coronary rhythm has been negative.  Considering her risk factors patient is referred for further admission evaluation.  Review of Systems:  1. General: No fevers, no chills, no weight gain or weight loss 2. ENT: No runny nose or sore throat, no hearing disturbances 3. Pulmonary: Positive dyspnea with palpitations, positive dry cough, but no wheezing, or hemoptysis 4. Cardiovascular: No angina, claudication, lower extremity edema, pnd or orthopnea 5. Gastrointestinal: No nausea or vomiting, no diarrhea or constipation 6. Hematology: No easy bruisability or frequent infections 7. Urology: No  dysuria, hematuria or increased urinary frequency 8. Dermatology: No rashes. 9. Neurology: No seizures or paresthesias 10. Musculoskeletal: No joint pain or deformities  Past Medical History:  Diagnosis Date  . ALCOHOL ABUSE, HX OF 11/06/2007  . ALLERGIC RHINITIS 11/06/2007  . Anxiety 04/03/2011  . ASTHMA 09/13/2007  . Chronic pain syndrome 12/15/2016  . COLONIC POLYPS, HX OF 02/09/2008    ADENOMATOUS POLYP  . Encounter for well adult exam without abnormal findings 04/03/2011  . HYPERLIPIDEMIA 11/03/2010  . HYPERTENSION 09/13/2007  . HYPOTHYROIDISM 11/06/2007  . Impaired glucose tolerance 07/30/2013  . INSOMNIA, HX OF 09/13/2007  . Lumbar degenerative disc disease 12/15/2016  . OSTEOPOROSIS 11/06/2007  . PERIMENOPAUSAL STATUS 09/13/2007  . RLS (restless legs syndrome)   . Stroke ([place])   . TIA (transient ischemic attack) 11/04/2011    Past Surgical History:  Procedure Laterality Date  . BREAST EXCISIONAL BIOPSY     left  . BREAST SURGERY  2008 and 2012   x 2 - benign, left side  . carotid artery surgery Left   . CESAREAN SECTION     x 3  . COLONOSCOPY  multiple   2010     reports that she has never smoked. She has never used smokeless tobacco. She reports that she does not drink alcohol or use drugs.  Allergies  Allergen Reactions  . Fosamax [Alendronate Sodium] Nausea And Vomiting  . Penicillins Hives    Did it involve swelling of the face/tongue/throat, SOB, or low BP? No Did it involve sudden or severe rash/hives, skin peeling, or any reaction on the inside of your mouth or nose? Yes Did you need to  seek medical attention at a hospital or doctor's office? Yes When did it last happen?Over 10 years ago If all above answers are "NO", may proceed with cephalosporin use.    Family History  Problem Relation Age of Onset  . Hyperlipidemia Other   . Hypertension Other   . Coronary artery disease Other   . Breast cancer Cousin   . Hypertension Mother   . Heart disease  Mother   . Dementia Maternal Grandmother   . Colon cancer Neg Hx      Prior to Admission medications   Medication Sig Start Date End Date Taking? Authorizing Provider  acetaminophen (TYLENOL) 325 MG tablet Take 650 mg by mouth every 6 (six) hours as needed for mild pain.    Yes [provider]  atorvastatin (LIPITOR) 40 MG tablet Take 1 tablet (40 mg total) by mouth daily. 10/20/18  Yes Biagio Borg, MD  benazepril-hydrochlorthiazide (LOTENSIN HCT) 10-12.5 MG tablet Take 1 tablet by mouth daily. 10/20/18  Yes Biagio Borg, MD  clopidogrel (PLAVIX) 75 MG tablet TAJE 1 TABLET BY MOUTH DAILY. Patient taking differently: Take 75 mg by mouth daily.  04/11/19  Yes Biagio Borg, MD  levalbuterol [facility] HFA) 45 MCG/ACT inhaler Inhale 2 puffs into the lungs 4 (four) times daily. Patient taking differently: Inhale 2 puffs into the lungs every 6 (six) hours as needed for wheezing or shortness of breath.  10/20/18  Yes Biagio Borg, MD  levothyroxine (SYNTHROID) 75 MCG tablet Take 1 tablet (75 mcg total) by mouth daily before breakfast. 04/20/19  Yes Biagio Borg, MD  montelukast (SINGULAIR) 10 MG tablet 1 tab by mouth daily Patient taking differently: Take 10 mg by mouth daily.  10/20/18  Yes Biagio Borg, MD  potassium chloride (MICRO-K) 10 MEQ CR capsule TAKE 1 TABLET BY MOUTH DAILY. Patient taking differently: Take 10 mEq by mouth daily.  10/20/18  Yes Biagio Borg, MD  rOPINIRole (REQUIP) 0.5 MG tablet Take 0.5 tablets (0.25 mg total) by mouth at bedtime. 01/16/19  Yes Dohmeier, Asencion Partridge, MD  traMADol (ULTRAM) 50 MG tablet Take 1 tablet (50 mg total) by mouth every 8 (eight) hours as needed. Patient taking differently: Take 50 mg by mouth every 8 (eight) hours as needed for moderate pain.  04/20/19  Yes Biagio Borg, MD  zolpidem (AMBIEN) 10 MG tablet Take 1 tablet (10 mg total) by mouth at bedtime as needed for up to 30 days for sleep. Patient taking differently: Take 10 mg by mouth at  bedtime.  04/11/19 05/11/19 Yes Biagio Borg, MD  gabapentin (NEURONTIN) 100 MG capsule TAKE 2 CAPSULES (200 MG TOTAL) BY MOUTH AT BEDTIME. Patient not taking: Reported on 05/09/2019 03/01/19   Lyndal Pulley, DO    Physical Exam: Vitals:   05/09/19 1213  BP: (!) 171/83  Pulse: 70  Resp: 16  Temp: 98.6 F (37 C)  TempSrc: Oral  SpO2: 100%  Weight: 59 kg  Height: 4\' 11"  (1.499 m)    Vitals:   05/09/19 1213  BP: (!) 171/83  Pulse: 70  Resp: 16  Temp: 98.6 F (37 C)  TempSrc: Oral  SpO2: 100%  Weight: 59 kg  Height: 4\' 11"  (1.499 m)   General:  Deconditioned and ill looking appearing.  Neurology: Awake and alert, non focal Head and Neck. Head normocephalic. Neck supple with no adenopathy or thyromegaly.   E ENT: mild pallor, no icterus, oral mucosa moist Cardiovascular: No JVD. S1-S2 present,  rhythmic, no gallops, rubs, or murmurs. No lower extremity edema. Pulmonary: positive breath sounds bilaterally, adequate air movement, no wheezing, rhonchi or rales. Gastrointestinal. Abdomen with no organomegaly, non tender, no rebound or guarding Skin. No rashes Musculoskeletal: no joint deformities    Labs on Admission: I have personally reviewed following labs and imaging studies  CBC: Recent Labs  Lab 05/09/19 1243  WBC 6.3  HGB 12.9  HCT 39.1  MCV 93.5  PLT 951   Basic Metabolic Panel: Recent Labs  Lab 05/09/19 1243  NA 140  K 3.3*  CL 107  CO2 25  GLUCOSE 96  BUN 20  CREATININE 0.88  CALCIUM 9.2   GFR: Estimated Creatinine Clearance: 48.5 mL/min (by C-G formula based on SCr of 0.88 mg/dL). Liver Function Tests: No results for input(s): AST, ALT, ALKPHOS, BILITOT, PROT, ALBUMIN in the last 168 hours. No results for input(s): LIPASE, AMYLASE in the last 168 hours. No results for input(s): AMMONIA in the last 168 hours. Coagulation Profile: No results for input(s): INR, PROTIME in the last 168 hours. Cardiac Enzymes: Recent Labs  Lab 05/09/19 1243   TROPONINI <0.03   BNP (last 3 results) No results for input(s): PROBNP in the last 8760 hours. HbA1C: No results for input(s): HGBA1C in the last 72 hours. CBG: No results for input(s): GLUCAP in the last 168 hours. Lipid Profile: No results for input(s): CHOL, HDL, LDLCALC, TRIG, CHOLHDL, LDLDIRECT in the last 72 hours. Thyroid Function Tests: No results for input(s): TSH, T4TOTAL, FREET4, T3FREE, THYROIDAB in the last 72 hours. Anemia Panel: No results for input(s): VITAMINB12, FOLATE, FERRITIN, TIBC, IRON, RETICCTPCT in the last 72 hours. Urine analysis:    Component Value Date/Time   COLORURINE YELLOW 04/19/2018 1024   APPEARANCEUR CLEAR 04/19/2018 1024   LABSPEC <=1.005 (A) 04/19/2018 1024   PHURINE 6.0 04/19/2018 1024   GLUCOSEU NEGATIVE 04/19/2018 1024   HGBUR NEGATIVE 04/19/2018 1024   BILIRUBINUR NEGATIVE 04/19/2018 1024   KETONESUR NEGATIVE 04/19/2018 1024   PROTEINUR NEGATIVE 06/11/2011 1130   UROBILINOGEN 0.2 04/19/2018 1024   NITRITE NEGATIVE 04/19/2018 1024   LEUKOCYTESUR NEGATIVE 04/19/2018 1024    Radiological Exams on Admission: Dg Chest 2 View  Result Date: 05/09/2019 CLINICAL DATA:  Chest pain. EXAM: CHEST - 2 VIEW COMPARISON:  07/28/2013. FINDINGS: Mediastinum hilar structures normal. Lungs are clear. No pleural effusion or pneumothorax. Heart size normal. Thoracic spine scoliosis and degenerative change. IMPRESSION: No acute cardiopulmonary disease. Electronically Signed   By: [place]   On: 05/09/2019 13:11    EKG: Independently reviewed.  71 bpm, normal axis, normal intervals, sinus rhythm with normal conduction, no ST segment or T wave changes.  Assessment/Plan Active Problems:   Chest pain  68 year old female with past medical history for hypertension, dyslipidemia and asthma who presents with recurrent and worsening episodes of palpitations which are not exertion related, associated with dyspnea and chest pain.  On her initial physical  examination her blood pressure is 155/95, pulse rate 71, respiratory rate 17, oxygen saturation 100%.  She has moist mucous membranes, lungs clear to auscultation heart S1-S2 present and rhythmic, abdomen soft, no lower extremity edema.  Sodium 140, potassium 3.3, chloride 107, bicarb 25, glucose 96, BUN 20, creatinine 0.88, white cell count 6.3, hemoglobin 12.9, hematocrit 39.1, platelets 216.  Her chest radiograph was negative for infiltrates.   Patient will be admitted to the hospital with a working diagnosis of atypical chest pain associated with palpitations, to rule out underlying arrhythmia.  1.  Atypical chest pain with palpitations.  Patient will be admitted to the telemetry unit for observation, continue trending cardiac enzymes, EKG in the morning.  Further work-up with echocardiography. If no events on telemetry she may need a outpatient cardiac monitoring for palpitations work-up.  2.  Hypertension.  Continue blood pressure control with benazepril and hydrochlorothiazide.  3.  Hypothyroidism, continue levothyroxine.  4.  Asthma.  It seems to be well controlled, no signs of acute exacerbation.  Continue as needed levalbuterol.   5.  Hypokalemia.  Continue potassium correction with potassium chloride.  DVT prophylaxis: enoxaparin  Code Status:  full  Family Communication: no family at the bedside   Disposition Plan:  Telemetry   Consults called:  None   Admission status:  Observation     Kaydi Kley Gerome Apley MD [facility]   05/09/2019, 2:03 PM

## 2019-05-09 NOTE — ED Notes (Signed)
ED TO INPATIENT HANDOFF REPORT  Name/Age/Gender [name] [name] 68 y.o. female  Code Status   Home/SNF/Other Home  Chief Complaint chest pain   Level of Care/Admitting Diagnosis ED Disposition    ED Disposition Condition Comment   Admit  Hospital Area: [facility] [[id number]]  Level of Care: Telemetry [5]  Admit to tele based on following criteria: Complex arrhythmia (Bradycardia/Tachycardia)  Covid Evaluation: N/A  Diagnosis: Chest pain [160109]  Admitting Physician: Tawni Millers [[id number]]  Attending Physician: Tawni Millers [[id number]]  PT Class (Do Not Modify): Observation [104]  PT Acc Code (Do Not Modify): Observation [10022]       Medical History Past Medical History:  Diagnosis Date  . ALCOHOL ABUSE, HX OF 11/06/2007  . ALLERGIC RHINITIS 11/06/2007  . Anxiety 04/03/2011  . ASTHMA 09/13/2007  . Chronic pain syndrome 12/15/2016  . COLONIC POLYPS, HX OF 02/09/2008    ADENOMATOUS POLYP  . Encounter for well adult exam without abnormal findings 04/03/2011  . HYPERLIPIDEMIA 11/03/2010  . HYPERTENSION 09/13/2007  . HYPOTHYROIDISM 11/06/2007  . Impaired glucose tolerance 07/30/2013  . INSOMNIA, HX OF 09/13/2007  . Lumbar degenerative disc disease 12/15/2016  . OSTEOPOROSIS 11/06/2007  . PERIMENOPAUSAL STATUS 09/13/2007  . RLS (restless legs syndrome)   . Stroke ([place])   . TIA (transient ischemic attack) 11/04/2011    Allergies Allergies  Allergen Reactions  . Fosamax [Alendronate Sodium] Nausea And Vomiting  . Penicillins Hives    Did it involve swelling of the face/tongue/throat, SOB, or low BP? No Did it involve sudden or severe rash/hives, skin peeling, or any reaction on the inside of your mouth or nose? Yes Did you need to seek medical attention at a hospital or doctor's office? Yes When did it last happen?Over 10 years ago If all above answers are "NO", may proceed with cephalosporin use.    IV  Location/Drains/Wounds Patient Lines/Drains/Airways Status   Active Line/Drains/Airways    Name:   Placement date:   Placement time:   Site:   Days:   Peripheral IV 05/09/19 Right Antecubital   05/09/19    1400    Antecubital   less than 1          Labs/Imaging Results for orders placed or performed during the hospital encounter of 05/09/19 (from the past 48 hour(s))  Basic metabolic panel     Status: Abnormal   Collection Time: 05/09/19 12:43 PM  Result Value Ref Range   Sodium 140 135 - 145 mmol/L   Potassium 3.3 (L) 3.5 - 5.1 mmol/L   Chloride 107 98 - 111 mmol/L   CO2 25 22 - 32 mmol/L   Glucose, Bld 96 70 - 99 mg/dL   BUN 20 8 - 23 mg/dL   Creatinine, Ser 0.88 0.44 - 1.00 mg/dL   Calcium 9.2 8.9 - 10.3 mg/dL   GFR calc non Af Amer >60 >60 mL/min   GFR calc Af Amer >60 >60 mL/min   Anion gap 8 5 - 15    Comment: Performed at [facility], [place] [address] [place], [place] [address]  CBC     Status: None   Collection Time: 05/09/19 12:43 PM  Result Value Ref Range   WBC 6.3 4.0 - 10.5 K/uL   RBC 4.18 3.87 - 5.11 MIL/uL   Hemoglobin 12.9 12.0 - 15.0 g/dL   HCT 39.1 36.0 - 46.0 %   MCV 93.5 80.0 - 100.0 fL   MCH 30.9 26.0 - 34.0  pg   MCHC 33.0 30.0 - 36.0 g/dL   RDW 14.2 11.5 - 15.5 %   Platelets 216 150 - 400 K/uL   nRBC 0.0 0.0 - 0.2 %    Comment: Performed at [facility], [place] [address], [place], [place] [address]  Troponin I - ONCE - STAT     Status: None   Collection Time: 05/09/19 12:43 PM  Result Value Ref Range   Troponin I <0.03 <0.03 ng/mL    Comment: Performed at [facility], [place] [address], [place], [place] [address]   Dg Chest 2 View  Result Date: 05/09/2019 CLINICAL DATA:  Chest pain. EXAM: CHEST - 2 VIEW COMPARISON:  07/28/2013. FINDINGS: Mediastinum hilar structures normal. Lungs are clear. No pleural effusion or pneumothorax. Heart size normal. Thoracic spine scoliosis and degenerative change.  IMPRESSION: No acute cardiopulmonary disease. Electronically Signed   By: Marcello Moores  Register   On: 05/09/2019 13:11    Pending Labs Unresulted Labs (From admission, onward)    Start     Ordered   05/09/19 1415  SARS Coronavirus 2 (CEPHEID - Performed in [facility] hospital lab), Hosp Order  (Asymptomatic Patients Labs)  Once,   R    Question:  Rule Out  Answer:  Yes   05/09/19 1414   Signed and Held  HIV antibody (Routine Testing)  Once,   R     Signed and Held   Signed and Held  CBC  (enoxaparin (LOVENOX)    CrCl >/= 30 ml/min)  Once,   R    Comments:  Baseline for enoxaparin therapy IF NOT ALREADY DRAWN.  Notify MD if PLT < 100 K.    Signed and Held   Signed and Held  Creatinine, serum  (enoxaparin (LOVENOX)    CrCl >/= 30 ml/min)  Once,   R    Comments:  Baseline for enoxaparin therapy IF NOT ALREADY DRAWN.    Signed and Held   Signed and Held  Creatinine, serum  (enoxaparin (LOVENOX)    CrCl >/= 30 ml/min)  Weekly,   R    Comments:  while on enoxaparin therapy    Signed and Held          Vitals/Pain Today's Vitals   05/09/19 1213 05/09/19 1217 05/09/19 1308  BP: (!) 171/83  (!) 155/95  Pulse: 70  71  Resp: 16  17  Temp: 98.6 F (37 C)    TempSrc: Oral    SpO2: 100%  100%  Weight: 130 lb (59 kg)    Height: 4\' 11"  (1.499 m)    PainSc:  10-Worst pain ever     Isolation Precautions No active isolations  Medications Medications  sodium chloride flush (NS) 0.9 % injection 3 mL (has no administration in time range)    Mobility walks 3

## 2019-05-09 NOTE — Telephone Encounter (Signed)
Pt stated her chest feels like it is getting slammed by a sledge hammer to the center of her chest. Radiates to left arm-pt stated that pain comes and goes. Pt stated the pain is severe at times. Pt with many cardiac risk factors. Pt stated she sweats when the pain starts. And c/o fluttering heart, mild SOB and dry cough (which pt stated that is normal for her.  Care advice given and pt asked to go to ED now.  Reason for Disposition . Pain also present in shoulder(s) or arm(s) or jaw  (Exception: pain is clearly made worse by movement)  Answer Assessment - Initial Assessment Questions 1. LOCATION: "Where does it hurt?"       Like a sledge hammer -center of chest 2. RADIATION: "Does the pain go anywhere else?" (e.g., into neck, jaw, arms, back)     Left arm 3. ONSET: "When did the chest pain begin?" (Minutes, hours or days)      Last week but worsened 4. PATTERN "Does the pain come and go, or has it been constant since it started?"  "Does it get worse with exertion?"      Comes and goes 5. DURATION: "How long does it last" (e.g., seconds, minutes, hours)     Few minutes 6. SEVERITY: "How bad is the pain?"  (e.g., Scale 1-10; mild, moderate, or severe)    - MILD (1-3): doesn't interfere with normal activities     - MODERATE (4-7): interferes with normal activities or awakens from sleep    - SEVERE (8-10): excruciating pain, unable to do any normal activities       severe 7. CARDIAC RISK FACTORS: "Do you have any history of heart problems or risk factors for heart disease?" (e.g., prior heart attack, angina; high blood pressure, diabetes, being overweight, high cholesterol, smoking, or strong family history of heart disease)     HTN, endarterectomy,high cholesterol, mother died of heart failure  8. PULMONARY RISK FACTORS: "Do you have any history of lung disease?"  (e.g., blood clots in lung, asthma, emphysema, birth control pills)     asthma 9. CAUSE: "What do you think is causing the chest  pain?"     Not sure 10. OTHER SYMPTOMS: "Do you have any other symptoms?" (e.g., dizziness, nausea, vomiting, sweating, fever, difficulty breathing, cough)       Sweats when pain begins, fluttering heart, mild SOB, dry cough pt says is normal for her. 11. PREGNANCY: "Is there any chance you are pregnant?" "When was your last menstrual period?"       n/a  Protocols used: CHEST PAIN-A-AH

## 2019-05-09 NOTE — ED Provider Notes (Signed)
[facility] DEPT Provider Note   CSN: [id number] Arrival date & time: 05/09/19  1158    History   Chief Complaint Chief Complaint  Patient presents with  . Chest Pain    HPI [name] [name] is a 68 y.o. female.     HPI 68 year old female with a history of hypertension, hyperlipidemia, prior TIA/stroke who presents to the emergency department complaints of intermittent chest heaviness with radiation to the left shoulder over the past week.  She reports the symptoms come and go.  They tend to occur at rest and are described as a pressure.  This makes her short of breath.  She also has associated palpitations with lightheadedness during this time.  She has had no chest discomfort while in the ER.  She came to the emergency department at the urging of her children as well as her primary care physician.  She has a prior history of tobacco abuse.  She is on Plavix.  No prior history of known coronary artery disease or heart catheterization.  She states these are new symptoms for her.  No unilateral leg swelling.  Symptoms are moderate in severity.  Currently without any discomfort.   Past Medical History:  Diagnosis Date  . ALCOHOL ABUSE, HX OF 11/06/2007  . ALLERGIC RHINITIS 11/06/2007  . Anxiety 04/03/2011  . ASTHMA 09/13/2007  . Chronic pain syndrome 12/15/2016  . COLONIC POLYPS, HX OF 02/09/2008    ADENOMATOUS POLYP  . Encounter for well adult exam without abnormal findings 04/03/2011  . HYPERLIPIDEMIA 11/03/2010  . HYPERTENSION 09/13/2007  . HYPOTHYROIDISM 11/06/2007  . Impaired glucose tolerance 07/30/2013  . INSOMNIA, HX OF 09/13/2007  . Lumbar degenerative disc disease 12/15/2016  . OSTEOPOROSIS 11/06/2007  . PERIMENOPAUSAL STATUS 09/13/2007  . RLS (restless legs syndrome)   . Stroke ([place])   . TIA (transient ischemic attack) 11/04/2011    Patient Active Problem List   Diagnosis Date Noted  . Arthritis of sacroiliac joint of both sides 05/01/2019   . Polyarthralgia 12/28/2018  . Preventative health care 10/20/2018  . RLS (restless legs syndrome) 09/27/2018  . Insomnia disorder related to known organic factor 06/27/2018  . PLMD (periodic limb movement disorder) 06/27/2018  . Hot flashes due to menopause 04/25/2018  . Heart palpitations 04/25/2018  . Diaphoresis 04/25/2018  . Long term current use of antithrombotics/antiplatelets 04/19/2018  . Insomnia 04/19/2018  . Intractable episodic headache 02/28/2018  . Numbness and tingling of right arm 02/28/2018  . Toe pain, right 10/19/2017  . Rotator cuff arthropathy of left shoulder 06/02/2017  . Trigger thumb of left hand 06/02/2017  . Lipoma 05/06/2017  . Pain of left thumb 05/06/2017  . Chronic pain syndrome 12/15/2016  . Lumbar degenerative disc disease 12/15/2016  . Left leg pain 01/28/2016  . Left shoulder pain 04/09/2015  . Impaired glucose tolerance 07/30/2013  . Abnormal breath sounds 07/30/2013  . Left carotid stenosis 05/05/2012  . Anxiety 04/03/2011  . CVA (cerebral infarction) 03/15/2011  . HLD (hyperlipidemia) 11/03/2010  . Hx of adenomatous colonic polyps 02/09/2008  . Hypothyroidism 11/06/2007  . ALLERGIC RHINITIS 11/06/2007  . OSTEOPOROSIS 11/06/2007  . ALCOHOL ABUSE, HX OF 11/06/2007  . Essential hypertension 09/13/2007  . Asthma 09/13/2007  . PERIMENOPAUSAL STATUS 09/13/2007  . Chronic insomnia 09/13/2007    Past Surgical History:  Procedure Laterality Date  . BREAST EXCISIONAL BIOPSY     left  . BREAST SURGERY  2008 and 2012   x 2 - benign, left side  .  carotid artery surgery Left   . CESAREAN SECTION     x 3  . COLONOSCOPY  multiple   2010     OB History   No obstetric history on file.      Home Medications    Prior to Admission medications   Medication Sig Start Date End Date Taking? Authorizing Provider  acetaminophen (TYLENOL) 325 MG tablet Take 650 mg by mouth every 6 (six) hours as needed for mild pain.    Yes [provider]  atorvastatin (LIPITOR) 40 MG tablet Take 1 tablet (40 mg total) by mouth daily. 10/20/18  Yes Biagio Borg, MD  benazepril-hydrochlorthiazide (LOTENSIN HCT) 10-12.5 MG tablet Take 1 tablet by mouth daily. 10/20/18  Yes Biagio Borg, MD  clopidogrel (PLAVIX) 75 MG tablet TAJE 1 TABLET BY MOUTH DAILY. Patient taking differently: Take 75 mg by mouth daily.  04/11/19  Yes Biagio Borg, MD  levalbuterol [facility] HFA) 45 MCG/ACT inhaler Inhale 2 puffs into the lungs 4 (four) times daily. Patient taking differently: Inhale 2 puffs into the lungs every 6 (six) hours as needed for wheezing or shortness of breath.  10/20/18  Yes Biagio Borg, MD  levothyroxine (SYNTHROID) 75 MCG tablet Take 1 tablet (75 mcg total) by mouth daily before breakfast. 04/20/19  Yes Biagio Borg, MD  montelukast (SINGULAIR) 10 MG tablet 1 tab by mouth daily Patient taking differently: Take 10 mg by mouth daily.  10/20/18  Yes Biagio Borg, MD  potassium chloride (MICRO-K) 10 MEQ CR capsule TAKE 1 TABLET BY MOUTH DAILY. Patient taking differently: Take 10 mEq by mouth daily.  10/20/18  Yes Biagio Borg, MD  rOPINIRole (REQUIP) 0.5 MG tablet Take 0.5 tablets (0.25 mg total) by mouth at bedtime. 01/16/19  Yes Dohmeier, Asencion Partridge, MD  traMADol (ULTRAM) 50 MG tablet Take 1 tablet (50 mg total) by mouth every 8 (eight) hours as needed. Patient taking differently: Take 50 mg by mouth every 8 (eight) hours as needed for moderate pain.  04/20/19  Yes Biagio Borg, MD  zolpidem (AMBIEN) 10 MG tablet Take 1 tablet (10 mg total) by mouth at bedtime as needed for up to 30 days for sleep. Patient taking differently: Take 10 mg by mouth at bedtime.  04/11/19 05/11/19 Yes Biagio Borg, MD  gabapentin (NEURONTIN) 100 MG capsule TAKE 2 CAPSULES (200 MG TOTAL) BY MOUTH AT BEDTIME. Patient not taking: Reported on 05/09/2019 03/01/19   Lyndal Pulley, DO    Family History Family History  Problem Relation Age of Onset  . Hyperlipidemia  Other   . Hypertension Other   . Coronary artery disease Other   . Breast cancer Cousin   . Hypertension Mother   . Heart disease Mother   . Dementia Maternal Grandmother   . Colon cancer Neg Hx     Social History Social History   Tobacco Use  . Smoking status: Never Smoker  . Smokeless tobacco: Never Used  Substance Use Topics  . Alcohol use: No  . Drug use: No     Allergies   Fosamax [alendronate sodium] and Penicillins   Review of Systems Review of Systems  All other systems reviewed and are negative.    Physical Exam Updated Vital Signs BP (!) 171/83 (BP Location: Right Arm)   Pulse 70   Temp 98.6 F (37 C) (Oral)   Resp 16   Ht 4\' 11"  (1.499 m)   Wt 59 kg   SpO2  100%   BMI 26.26 kg/m   Physical Exam Vitals signs and nursing note reviewed.  Constitutional:      General: She is not in acute distress.    Appearance: She is well-developed.  HENT:     Head: Normocephalic and atraumatic.  Neck:     Musculoskeletal: Normal range of motion.  Cardiovascular:     Rate and Rhythm: Normal rate and regular rhythm.     Heart sounds: Normal heart sounds.  Pulmonary:     Effort: Pulmonary effort is normal.     Breath sounds: Normal breath sounds.  Abdominal:     General: There is no distension.     Palpations: Abdomen is soft.     Tenderness: There is no abdominal tenderness.  Musculoskeletal: Normal range of motion.  Skin:    General: Skin is warm and dry.  Neurological:     Mental Status: She is alert and oriented to person, place, and time.  Psychiatric:        Judgment: Judgment normal.      ED Treatments / Results  Labs (all labs ordered are listed, but only abnormal results are displayed) Labs Reviewed  BASIC METABOLIC PANEL - Abnormal; Notable for the following components:      Result Value   Potassium 3.3 (*)    All other components within normal limits  CBC  TROPONIN I    EKG EKG Interpretation  Date/Time:  Tuesday May 09 2019  12:11:02 EDT Ventricular Rate:  71 PR Interval:    QRS Duration: 88 QT Interval:  407 QTC Calculation: 443 R Axis:   75 Text Interpretation:  Sinus rhythm Consider left ventricular hypertrophy Nonspecific T abnormalities, lateral leads Baseline wander in lead(s) V2 V5 No significant change was found Confirmed by Jola Schmidt [id number]) on 05/09/2019 12:40:18 PM   Radiology Dg Chest 2 View  Result Date: 05/09/2019 CLINICAL DATA:  Chest pain. EXAM: CHEST - 2 VIEW COMPARISON:  07/28/2013. FINDINGS: Mediastinum hilar structures normal. Lungs are clear. No pleural effusion or pneumothorax. Heart size normal. Thoracic spine scoliosis and degenerative change. IMPRESSION: No acute cardiopulmonary disease. Electronically Signed   By: Marcello Moores  Register   On: 05/09/2019 13:11    Procedures Procedures (including critical care time)  Medications Ordered in ED Medications  sodium chloride flush (NS) 0.9 % injection 3 mL (has no administration in time range)     Initial Impression / Assessment and Plan / ED Course  I have reviewed the triage vital signs and the nursing notes.  Pertinent labs & imaging results that were available during my care of the patient were reviewed by me and considered in my medical decision making (see chart for details).        Patient with cardiac risk factors.  Concerning history.  Could represent arrhythmia given the palpitations with associated chest discomfort.  Patient will benefit from 24-hour [place] in the hospital for telemetry monitoring and troponin cycling.  Patient and family agreeable to observation overnight in the hospital.  Patient will be admitted to the hospitalist service.  Current EKG without acute ischemic changes.  Initial troponin is negative.  We will continue to observe the patient closely while in the emergency department.  Final Clinical Impressions(s) / ED Diagnoses   Final diagnoses:  None    ED Discharge Orders    None       Jola Schmidt, MD 05/09/19 1416

## 2019-05-09 NOTE — ED Triage Notes (Signed)
Pt states she has been having chest pain for several days. Pt states that she initially thought it was her asthma flaring up at first, but the pain would not ease up. Pt states that it is center chest .

## 2019-05-10 ENCOUNTER — Observation Stay (HOSPITAL_BASED_OUTPATIENT_CLINIC_OR_DEPARTMENT_OTHER): Payer: Medicare Other

## 2019-05-10 ENCOUNTER — Other Ambulatory Visit: Payer: Self-pay | Admitting: Cardiology

## 2019-05-10 DIAGNOSIS — R002 Palpitations: Secondary | ICD-10-CM

## 2019-05-10 DIAGNOSIS — R072 Precordial pain: Secondary | ICD-10-CM | POA: Diagnosis not present

## 2019-05-10 DIAGNOSIS — R0789 Other chest pain: Secondary | ICD-10-CM | POA: Diagnosis not present

## 2019-05-10 DIAGNOSIS — Z1159 Encounter for screening for other viral diseases: Secondary | ICD-10-CM | POA: Diagnosis not present

## 2019-05-10 DIAGNOSIS — E785 Hyperlipidemia, unspecified: Secondary | ICD-10-CM | POA: Diagnosis not present

## 2019-05-10 LAB — BASIC METABOLIC PANEL
Anion gap: 5 (ref 5–15)
BUN: 20 mg/dL (ref 8–23)
CO2: 25 mmol/L (ref 22–32)
Calcium: 8.7 mg/dL — ABNORMAL LOW (ref 8.9–10.3)
Chloride: 110 mmol/L (ref 98–111)
Creatinine, Ser: 0.81 mg/dL (ref 0.44–1.00)
GFR calc Af Amer: >60 mL/min (ref >60)
GFR calc non Af Amer: >60 mL/min (ref >60)
Glucose, Bld: 101 mg/dL — ABNORMAL HIGH (ref 70–99)
Potassium: 4.4 mmol/L (ref 3.5–5.1)
Sodium: 140 mmol/L (ref 135–145)

## 2019-05-10 LAB — ECHOCARDIOGRAM COMPLETE
Height: 59 in
Weight: 2148.8 oz

## 2019-05-10 LAB — TROPONIN I: Troponin I: <0.03 ng/mL (ref <0.03)

## 2019-05-10 LAB — HIV ANTIBODY (ROUTINE TESTING W REFLEX): HIV Screen 4th Generation wRfx: NONREACTIVE

## 2019-05-10 NOTE — Discharge Summary (Signed)
Physician Discharge Summary  [name] [name] YOV:[id number] DOB: 11/27/1951 DOA: 05/09/2019  PCP: Biagio Borg, MD  Admit date: 05/09/2019 Discharge date: 05/10/2019  Admitted From: Home Disposition: Home  Recommendations for Outpatient Follow-up:  1. Follow up with PCP in 1-2 weeks 2. Please obtain BMP/CBC in one week 3. Please follow-up with cardiology Dr. Einar Gip please call for appointment place a event monitor  Home Health none  equipment/Devices: None Discharge Condition stable and improved CODE STATUS: Full code Diet recommendation: Cardiac diet  Brief/Interim Summary:68 y.o. female with medical history significant of asthma, anxiety, hypertension, dyslipidemia and allergic rhinitis.  Patient reports 7 days of intermittent palpitations, non-exertion related, moderate to severe in intensity, self limiting after 5 to 10 minutes, associated with dyspnea and left-sided chest pain which radiated down to her left arm.  There is no apparent triggering factors.  On severe occasions it has been associated with dizziness and lightheadedness.  Last night she had multiple episodes back-to-back, with worsening left-sided chest pain.  She came this morning to the hospital for further evaluation.  She is physically active at home, able to climb steps and do light yard work with no angina.  Denies any PND, orthopnea or lower extremity edema.  Her asthma has been well controlled, tends to exacerbate with seasonal changes.  Over the last weekend she had increased the use of her albuterol inhaler with intention to relief her palpitations and dyspnea but it failed to improve her symptoms.   ED Course: Patient was noted to be chest pain-free, her vitals have been stable, initial work-up for acute coronary rhythm has been negative.  Considering her risk factors patient is referred for further admission evaluation.   Discharge Diagnoses:  Active Problems:   Chest pain   #1 atypical chest pain with  palpitation patient was admitted to telemetry floor cardiac enzymes remain negative echo is pending EKG shows no acute changes.  Patient is anxious to go home she will follow-up with cardiology for event monitor placement.  I will follow-up the echo and call the patient with the report.  She has been stable she has no further chest pain shortness of breath or palpitation.  #2 hypertension continue home medications ACE inhibitor and hydrochlorothiazide.  #3 hypothyroidism continue Synthroid.  #4 history of asthma continue inhaler at home.  #5 mild hypokalemia patient is on potassium replacement at home continue with hydrochlorothiazide.  Estimated body mass index is 27.13 kg/m as calculated from the following:   Height as of this encounter: 4\' 11"  (1.499 m).   Weight as of this encounter: 60.9 kg.  Discharge Instructions   Allergies as of 05/10/2019      Reactions   Fosamax [alendronate Sodium] Nausea And Vomiting   Penicillins Hives   Did it involve swelling of the face/tongue/throat, SOB, or low BP? No Did it involve sudden or severe rash/hives, skin peeling, or any reaction on the inside of your mouth or nose? Yes Did you need to seek medical attention at a hospital or doctor's office? Yes When did it last happen?Over 10 years ago If all above answers are "NO", may proceed with cephalosporin use.      Medication List    STOP taking these medications   gabapentin 100 MG capsule Commonly known as:  NEURONTIN     TAKE these medications   acetaminophen 325 MG tablet Commonly known as:  TYLENOL Take 650 mg by mouth every 6 (six) hours as needed for mild pain.   atorvastatin 40  MG tablet Commonly known as:  LIPITOR Take 1 tablet (40 mg total) by mouth daily.   benazepril-hydrochlorthiazide 10-12.5 MG tablet Commonly known as:  LOTENSIN HCT Take 1 tablet by mouth daily.   clopidogrel 75 MG tablet Commonly known as:  PLAVIX TAJE 1 TABLET BY MOUTH DAILY. What changed:   See the new instructions.   levalbuterol 45 MCG/ACT inhaler Commonly known as:  XOPENEX HFA Inhale 2 puffs into the lungs 4 (four) times daily. What changed:    when to take this  reasons to take this   levothyroxine 75 MCG tablet Commonly known as:  SYNTHROID Take 1 tablet (75 mcg total) by mouth daily before breakfast.   montelukast 10 MG tablet Commonly known as:  SINGULAIR 1 tab by mouth daily What changed:    how much to take  how to take this  when to take this  additional instructions   potassium chloride 10 MEQ CR capsule Commonly known as:  MICRO-K TAKE 1 TABLET BY MOUTH DAILY. What changed:    how much to take  how to take this  when to take this  additional instructions   rOPINIRole 0.5 MG tablet Commonly known as:  Requip Take 0.5 tablets (0.25 mg total) by mouth at bedtime.   traMADol 50 MG tablet Commonly known as:  ULTRAM Take 1 tablet (50 mg total) by mouth every 8 (eight) hours as needed. What changed:  reasons to take this   zolpidem 10 MG tablet Commonly known as:  AMBIEN Take 1 tablet (10 mg total) by mouth at bedtime as needed for up to 30 days for sleep. What changed:  when to take this      Follow-up Information    Biagio Borg, MD.   Specialties:  Internal Medicine, Radiology Contact information: [place] [address] [phone]        Adrian Prows, MD Follow up.   Specialty:  Cardiology Contact information: [place] [address] [phone]          Allergies  Allergen Reactions  . Fosamax [Alendronate Sodium] Nausea And Vomiting  . Penicillins Hives    Did it involve swelling of the face/tongue/throat, SOB, or low BP? No Did it involve sudden or severe rash/hives, skin peeling, or any reaction on the inside of your mouth or nose? Yes Did you need to seek medical attention at a hospital or doctor's office? Yes When did it last happen?Over 10 years ago If all  above answers are "NO", may proceed with cephalosporin use.    Consultations: None  Procedures/Studies: Dg Chest 2 View  Result Date: 05/09/2019 CLINICAL DATA:  Chest pain. EXAM: CHEST - 2 VIEW COMPARISON:  07/28/2013. FINDINGS: Mediastinum hilar structures normal. Lungs are clear. No pleural effusion or pneumothorax. Heart size normal. Thoracic spine scoliosis and degenerative change. IMPRESSION: No acute cardiopulmonary disease. Electronically Signed   By: [place]   On: 05/09/2019 13:11   [place] Limited Joint Space Structures Low Right  Result Date: 05/08/2019 Procedure: Real-time Ultrasound Guided Injection of left sacroiliac joint Device: GE Logiq Q7 Ultrasound guided injection is preferred based studies that show increased duration, increased effect, greater accuracy, decreased procedural pain, increased response rate, and decreased cost with ultrasound guided versus blind injection. Verbal informed consent obtained. Time-out conducted. Noted no overlying erythema, induration, or other signs of local infection. Skin prepped in a sterile fashion. Local anesthesia: Topical Ethyl chloride. With sterile technique and under  real time ultrasound guidance: With a 21-gauge 3 inch needle patient was injected with 1 cc of 0.5% Marcaine and 1 cc of Kenalog 40 mg/mL into the left sacroiliac joint Completed without difficulty Pain immediately resolved suggesting accurate placement of the medication. Advised to call if fevers/chills, erythema, induration, drainage, or persistent bleeding. Images permanently stored and available for review in the ultrasound unit. Impression: Technically successful ultrasound guided injection. Procedure: Real-time Ultrasound Guided Injection of right sacroiliac joint Device: GE Logiq Q7 Ultrasound guided injection is preferred based studies that show increased duration, increased effect, greater accuracy, decreased procedural pain, increased response rate, and decreased cost  with ultrasound guided versus blind injection. Verbal informed consent obtained. Time-out conducted. Noted no overlying erythema, induration, or other signs of local infection. Skin prepped in a sterile fashion. Local anesthesia: Topical Ethyl chloride. With sterile technique and under real time ultrasound guidance: With a 21-gauge 3 inch needle patient was injected with 1 cc of Kenalog 40 mg/mL into the right sacroiliac joint" l Completed without difficulty Pain immediately resolved suggesting accurate placement of the medication. Advised to call if fevers/chills, erythema, induration, drainage, or persistent bleeding. Images permanently stored and I made a disc available for review in the ultrasound unit. Impression: Technically successful ultrasound guided injection.    (Echo, Carotid, EGD, Colonoscopy, ERCP)    Subjective: He is resting in bed awake alert no further chest pain palpitation shortness of breath nausea vomiting very anxious to go home reports her mother passed away with heart failure in October at [facility] she does not want to be here.  Discharge Exam: Vitals:   05/09/19 2116 05/10/19 0451  BP: 139/75 (!) 156/81  Pulse: 70 67  Resp: 16 16  Temp: 98.4 F (36.9 C) 98.2 F (36.8 C)  SpO2: 98% 98%   Vitals:   05/09/19 1449 05/09/19 1519 05/09/19 2116 05/10/19 0451  BP: (!) 151/84 (!) 176/87 139/75 (!) 156/81  Pulse: 65 66 70 67  Resp: 15 16 16 16   Temp:  98.8 F (37.1 C) 98.4 F (36.9 C) 98.2 F (36.8 C)  TempSrc:  Oral Oral Oral  SpO2: 100% 100% 98% 98%  Weight:  60.9 kg    Height:        General: Pt is alert, awake, not in acute distress Cardiovascular: RRR, S1/S2 +, no rubs, no gallops Respiratory: CTA bilaterally, no wheezing, no rhonchi Abdominal: Soft, NT, ND, bowel sounds + Extremities: no edema, no cyanosis    The results of significant diagnostics from this hospitalization (including imaging, microbiology, ancillary and laboratory) are listed  below for reference.     Microbiology: Recent Results (from the past 240 hour(s))  SARS Coronavirus 2 (CEPHEID - Performed in [facility] hospital lab), Hosp Order     Status: None   Collection Time: 05/09/19  2:52 PM  Result Value Ref Range Status   SARS Coronavirus 2 NEGATIVE NEGATIVE Final    Comment: (NOTE) If result is NEGATIVE SARS-CoV-2 target nucleic acids are NOT DETECTED. The SARS-CoV-2 RNA is generally detectable in upper and lower  respiratory specimens during the acute phase of infection. The lowest  concentration of SARS-CoV-2 viral copies this assay can detect is 250  copies / mL. A negative result does not preclude SARS-CoV-2 infection  and should not be used as the sole basis for treatment or other  patient management decisions.  A negative result may occur with  improper specimen collection / handling, submission of specimen other  than nasopharyngeal swab, presence  of viral mutation(s) within the  areas targeted by this assay, and inadequate number of viral copies  (<250 copies / mL). A negative result must be combined with clinical  observations, patient history, and epidemiological information. If result is POSITIVE SARS-CoV-2 target nucleic acids are DETECTED. The SARS-CoV-2 RNA is generally detectable in upper and lower  respiratory specimens dur ing the acute phase of infection.  Positive  results are indicative of active infection with SARS-CoV-2.  Clinical  correlation with patient history and other diagnostic information is  necessary to determine patient infection status.  Positive results do  not rule out bacterial infection or co-infection with other viruses. If result is PRESUMPTIVE POSTIVE SARS-CoV-2 nucleic acids MAY BE PRESENT.   A presumptive positive result was obtained on the submitted specimen  and confirmed on repeat testing.  While 2019 novel coronavirus  (SARS-CoV-2) nucleic acids may be present in the submitted sample  additional  confirmatory testing may be necessary for epidemiological  and / or clinical management purposes  to differentiate between  SARS-CoV-2 and other Sarbecovirus currently known to infect humans.  If clinically indicated additional testing with an alternate test  methodology [phone]) is advised. The SARS-CoV-2 RNA is generally  detectable in upper and lower respiratory sp ecimens during the acute  phase of infection. The expected result is Negative. Fact Sheet for Patients:  StrictlyIdeas.no Fact Sheet for Healthcare Providers: BankingDealers.co.za This test is not yet approved or cleared by the [place] FDA and has been authorized for detection and/or diagnosis of SARS-CoV-2 by FDA under an Emergency Use Authorization (EUA).  This EUA will remain in effect (meaning this test can be used) for the duration of the COVID-19 declaration under Section 564(b)(1) of the Act, 21 U.S.C. section 360bbb-3(b)(1), unless the authorization is terminated or revoked sooner. Performed at [facility], [place] [address] [place], [place] [address]      Labs: BNP (last 3 results) No results for input(s): BNP in the last 8760 hours. Basic Metabolic Panel: Recent Labs  Lab 05/09/19 1243 05/10/19 0517  NA 140 140  K 3.3* 4.4  CL 107 110  CO2 25 25  GLUCOSE 96 101*  BUN 20 20  CREATININE 0.88 0.81  CALCIUM 9.2 8.7*   Liver Function Tests: No results for input(s): AST, ALT, ALKPHOS, BILITOT, PROT, ALBUMIN in the last 168 hours. No results for input(s): LIPASE, AMYLASE in the last 168 hours. No results for input(s): AMMONIA in the last 168 hours. CBC: Recent Labs  Lab 05/09/19 1243  WBC 6.3  HGB 12.9  HCT 39.1  MCV 93.5  PLT 216   Cardiac Enzymes: Recent Labs  Lab 05/09/19 1243 05/09/19 1841 05/09/19 2325 05/10/19 0517  TROPONINI <0.03 <0.03 <0.03 <0.03   BNP: Invalid input(s): POCBNP CBG: No results for  input(s): GLUCAP in the last 168 hours. D-Dimer No results for input(s): DDIMER in the last 72 hours. Hgb A1c No results for input(s): HGBA1C in the last 72 hours. Lipid Profile No results for input(s): CHOL, HDL, LDLCALC, TRIG, CHOLHDL, LDLDIRECT in the last 72 hours. Thyroid function studies No results for input(s): TSH, T4TOTAL, T3FREE, THYROIDAB in the last 72 hours.  Invalid input(s): FREET3 Anemia work up No results for input(s): VITAMINB12, FOLATE, FERRITIN, TIBC, IRON, RETICCTPCT in the last 72 hours. Urinalysis    Component Value Date/Time   COLORURINE YELLOW 04/19/2018 1024   APPEARANCEUR CLEAR 04/19/2018 1024   LABSPEC <=1.005 (A) 04/19/2018 1024   PHURINE 6.0 04/19/2018 1024  GLUCOSEU NEGATIVE 04/19/2018 1024   HGBUR NEGATIVE 04/19/2018 1024   BILIRUBINUR NEGATIVE 04/19/2018 1024   KETONESUR NEGATIVE 04/19/2018 1024   PROTEINUR NEGATIVE 06/11/2011 1130   UROBILINOGEN 0.2 04/19/2018 1024   NITRITE NEGATIVE 04/19/2018 1024   LEUKOCYTESUR NEGATIVE 04/19/2018 1024   Sepsis Labs Invalid input(s): PROCALCITONIN,  WBC,  LACTICIDVEN Microbiology Recent Results (from the past 240 hour(s))  SARS Coronavirus 2 (CEPHEID - Performed in [facility] hospital lab), Hosp Order     Status: None   Collection Time: 05/09/19  2:52 PM  Result Value Ref Range Status   SARS Coronavirus 2 NEGATIVE NEGATIVE Final    Comment: (NOTE) If result is NEGATIVE SARS-CoV-2 target nucleic acids are NOT DETECTED. The SARS-CoV-2 RNA is generally detectable in upper and lower  respiratory specimens during the acute phase of infection. The lowest  concentration of SARS-CoV-2 viral copies this assay can detect is 250  copies / mL. A negative result does not preclude SARS-CoV-2 infection  and should not be used as the sole basis for treatment or other  patient management decisions.  A negative result may occur with  improper specimen collection / handling, submission of specimen other  than  nasopharyngeal swab, presence of viral mutation(s) within the  areas targeted by this assay, and inadequate number of viral copies  (<250 copies / mL). A negative result must be combined with clinical  observations, patient history, and epidemiological information. If result is POSITIVE SARS-CoV-2 target nucleic acids are DETECTED. The SARS-CoV-2 RNA is generally detectable in upper and lower  respiratory specimens dur ing the acute phase of infection.  Positive  results are indicative of active infection with SARS-CoV-2.  Clinical  correlation with patient history and other diagnostic information is  necessary to determine patient infection status.  Positive results do  not rule out bacterial infection or co-infection with other viruses. If result is PRESUMPTIVE POSTIVE SARS-CoV-2 nucleic acids MAY BE PRESENT.   A presumptive positive result was obtained on the submitted specimen  and confirmed on repeat testing.  While 2019 novel coronavirus  (SARS-CoV-2) nucleic acids may be present in the submitted sample  additional confirmatory testing may be necessary for epidemiological  and / or clinical management purposes  to differentiate between  SARS-CoV-2 and other Sarbecovirus currently known to infect humans.  If clinically indicated additional testing with an alternate test  methodology [phone]) is advised. The SARS-CoV-2 RNA is generally  detectable in upper and lower respiratory sp ecimens during the acute  phase of infection. The expected result is Negative. Fact Sheet for Patients:  StrictlyIdeas.no Fact Sheet for Healthcare Providers: BankingDealers.co.za This test is not yet approved or cleared by the [place] FDA and has been authorized for detection and/or diagnosis of SARS-CoV-2 by FDA under an Emergency Use Authorization (EUA).  This EUA will remain in effect (meaning this test can be used) for the duration of  the COVID-19 declaration under Section 564(b)(1) of the Act, 21 U.S.C. section 360bbb-3(b)(1), unless the authorization is terminated or revoked sooner. Performed at [facility], [place] [address] [place], [place] [address]      Time coordinating discharge:34 minutes  SIGNED:   Georgette Shell, MD  [facility] 05/10/2019, 12:23 PM Pager   If 7PM-7AM, please contact night-coverage [email or web address] Password [email or web address]

## 2019-05-10 NOTE — Care Management Obs Status (Signed)
[facility] NOTIFICATION   Patient Details  Name: [name] [name] MRN: [id number] Date of Birth: 1951-09-03   Medicare Observation Status Notification Given:  Yes    Joaquin Courts, RN 05/10/2019, 12:15 PM

## 2019-05-10 NOTE — Progress Notes (Signed)
  Echocardiogram 2D Echocardiogram has been performed.  [name] [name] 05/10/2019, 9:03 AM

## 2019-05-11 ENCOUNTER — Other Ambulatory Visit: Payer: Self-pay

## 2019-05-11 ENCOUNTER — Encounter: Payer: Self-pay | Admitting: Internal Medicine

## 2019-05-11 ENCOUNTER — Ambulatory Visit (INDEPENDENT_AMBULATORY_CARE_PROVIDER_SITE_OTHER): Payer: Medicare Other | Admitting: Internal Medicine

## 2019-05-11 VITALS — BP 162/98 | HR 76 | Temp 98.4°F | Ht 59.0 in | Wt 134.0 lb

## 2019-05-11 DIAGNOSIS — F419 Anxiety disorder, unspecified: Secondary | ICD-10-CM

## 2019-05-11 DIAGNOSIS — I10 Essential (primary) hypertension: Secondary | ICD-10-CM | POA: Diagnosis not present

## 2019-05-11 DIAGNOSIS — R079 Chest pain, unspecified: Secondary | ICD-10-CM | POA: Diagnosis not present

## 2019-05-11 DIAGNOSIS — F329 Major depressive disorder, single episode, unspecified: Secondary | ICD-10-CM

## 2019-05-11 DIAGNOSIS — F32A Depression, unspecified: Secondary | ICD-10-CM | POA: Insufficient documentation

## 2019-05-11 DIAGNOSIS — R002 Palpitations: Secondary | ICD-10-CM | POA: Diagnosis not present

## 2019-05-11 MED ORDER — METOPROLOL SUCCINATE ER 25 MG PO TB24: 25.0000 mg | ORAL_TABLET | Freq: Every day | ORAL | 3 refills | Status: DC

## 2019-05-11 MED ORDER — LEVALBUTEROL TARTRATE 45 MCG/ACT IN AERO: 2.0000 | INHALATION_SPRAY | Freq: Four times a day (QID) | RESPIRATORY_TRACT | 12 refills | Status: DC

## 2019-05-11 MED ORDER — CITALOPRAM HYDROBROMIDE 10 MG PO TABS: 10.0000 mg | ORAL_TABLET | Freq: Every day | ORAL | 3 refills | Status: DC

## 2019-05-11 NOTE — Patient Instructions (Addendum)
Please take all new medication as prescribed - the generic toprol XL 25 mg per day, and the generic Celexa 10 mg per day  Please continue all other medications as before, and refills have been done if requested.  Please have the pharmacy call with any other refills you may need.  Please continue your efforts at being more active, low cholesterol diet, and weight control.  Please keep your appointments with your specialists as you may have planned - Cardiology for June 17

## 2019-05-11 NOTE — Progress Notes (Signed)
Subjective:    Patient ID: [name] [name], female    DOB: 23-Jan-1951, 68 y.o.   MRN: [id number]  HPI  Here to f/u recent hospn 5/26 - 5/27 with CP, palpitations, HTN worsening in the setting of chronic LBP not well controlled per pt, anxiety.  Also with hx of CVA  and history significant ofasthma, anxiety, hypertension, dyslipidemia and allergic rhinitis.  Shw was found to have atypical chest pain with palpitation patient was admitted to telemetry floor cardiac enzymes remain negative, echo essentially unremarkable, EKG shows no acute changes.  Patient was anxious to go home she will follow-up with cardiology for event monitor placement. Pt was asked to f/u here at 1-2 wks, but BP at home remains elevated and she was urged by family to be seen today.  Pt reports good compliance with benazepril HCT, and in fact in the past has been on higher dosing that needed to be reduced due to overcontrol.  Pt was d/c pain free, but pt states CP onset this AM again similar, mod to severe, concrete block like pressure with occasional sledgehammer flares, without radiation, but associated with palpitations, but not worsening sob, n/v, or syncope.  Pt denies new neurological symptoms such as new headache, or facial or extremity weakness or numbness   Pt denies polydipsia, polyuria.   Pt denies fever, wt loss, night sweats, loss of appetite, or other constitutional symptoms Has had more stress recently - Denies worsening depressive symptoms, suicidal ideation, or panic, but has significant ongoing anxiety, not wanting tx in the past, now willing to try SSRI.  Pt continues to have recurring LBP without change in severity, bowel or bladder change, fever, wt loss,  worsening LE pain/numbness/weakness, gait change or falls, and states ultram not really working that well, take infrequently but has to take 100 mg to be effective.  BP Readings from Last 3 Encounters:  05/11/19 (!) 162/98  05/10/19 (!) 156/81  05/01/19 122/84   Wt Readings from Last 3 Encounters:  05/11/19 134 lb (60.8 kg)  05/09/19 134 lb 4.8 oz (60.9 kg)  05/01/19 137 lb (62.1 kg)   Past Medical History:  Diagnosis Date  . ALCOHOL ABUSE, HX OF 11/06/2007  . ALLERGIC RHINITIS 11/06/2007  . Anxiety 04/03/2011  . ASTHMA 09/13/2007  . Chronic pain syndrome 12/15/2016  . COLONIC POLYPS, HX OF 02/09/2008    ADENOMATOUS POLYP  . Encounter for well adult exam without abnormal findings 04/03/2011  . HYPERLIPIDEMIA 11/03/2010  . HYPERTENSION 09/13/2007  . HYPOTHYROIDISM 11/06/2007  . Impaired glucose tolerance 07/30/2013  . INSOMNIA, HX OF 09/13/2007  . Lumbar degenerative disc disease 12/15/2016  . OSTEOPOROSIS 11/06/2007  . PERIMENOPAUSAL STATUS 09/13/2007  . RLS (restless legs syndrome)   . Stroke ([place])   . TIA (transient ischemic attack) 11/04/2011   Past Surgical History:  Procedure Laterality Date  . BREAST EXCISIONAL BIOPSY     left  . BREAST SURGERY  2008 and 2012   x 2 - benign, left side  . carotid artery surgery Left   . CESAREAN SECTION     x 3  . COLONOSCOPY  multiple   2010    reports that she has never smoked. She has never used smokeless tobacco. She reports that she does not drink alcohol or use drugs. family history includes Breast cancer in her cousin; Coronary artery disease in an other family member; Dementia in her maternal grandmother; Heart disease in her mother; Hyperlipidemia in an other family member; Hypertension in her  mother and another family member. Allergies  Allergen Reactions  . Fosamax [Alendronate Sodium] Nausea And Vomiting  . Penicillins Hives    Did it involve swelling of the face/tongue/throat, SOB, or low BP? No Did it involve sudden or severe rash/hives, skin peeling, or any reaction on the inside of your mouth or nose? Yes Did you need to seek medical attention at a hospital or doctor's office? Yes When did it last happen?Over 10 years ago If all above answers are "NO", may proceed with  cephalosporin use.   Current Outpatient Medications on File Prior to Visit  Medication Sig Dispense Refill  . acetaminophen (TYLENOL) 325 MG tablet Take 650 mg by mouth every 6 (six) hours as needed for mild pain.     Marland Kitchen atorvastatin (LIPITOR) 40 MG tablet Take 1 tablet (40 mg total) by mouth daily. 90 tablet 3  . benazepril-hydrochlorthiazide (LOTENSIN HCT) 10-12.5 MG tablet Take 1 tablet by mouth daily. 90 tablet 3  . clopidogrel (PLAVIX) 75 MG tablet TAJE 1 TABLET BY MOUTH DAILY. (Patient taking differently: Take 75 mg by mouth daily. ) 90 tablet 1  . levothyroxine (SYNTHROID) 75 MCG tablet Take 1 tablet (75 mcg total) by mouth daily before breakfast. 90 tablet 2  . montelukast (SINGULAIR) 10 MG tablet 1 tab by mouth daily (Patient taking differently: Take 10 mg by mouth daily. ) 90 tablet 3  . potassium chloride (MICRO-K) 10 MEQ CR capsule TAKE 1 TABLET BY MOUTH DAILY. (Patient taking differently: Take 10 mEq by mouth daily. ) 90 capsule 3  . rOPINIRole (REQUIP) 0.5 MG tablet Take 0.5 tablets (0.25 mg total) by mouth at bedtime. 30 tablet 5  . traMADol (ULTRAM) 50 MG tablet Take 1 tablet (50 mg total) by mouth every 8 (eight) hours as needed. (Patient taking differently: Take 50 mg by mouth every 8 (eight) hours as needed for moderate pain. ) 90 tablet 5  . zolpidem (AMBIEN) 10 MG tablet Take 1 tablet (10 mg total) by mouth at bedtime as needed for up to 30 days for sleep. (Patient taking differently: Take 10 mg by mouth at bedtime. ) 90 tablet 1   No current facility-administered medications on file prior to visit.    Review of Systems  Constitutional: Negative for other unusual diaphoresis or sweats HENT: Negative for ear discharge or swelling Eyes: Negative for other worsening visual disturbances Respiratory: Negative for stridor or other swelling  Gastrointestinal: Negative for worsening distension or other blood Genitourinary: Negative for retention or other urinary change  Musculoskeletal: Negative for other MSK pain or swelling Skin: Negative for color change or other new lesions Neurological: Negative for worsening tremors and other numbness  Psychiatric/Behavioral: Negative for worsening agitation or other fatigue All other system neg per pt    Objective:   Physical Exam BP (!) 162/98   Pulse 76   Temp 98.4 F (36.9 C) (Oral)   Ht 4\' 11"  (1.499 m)   Wt 134 lb (60.8 kg)   SpO2 96%   BMI 27.06 kg/m  VS noted,  Constitutional: Pt appears in NAD HENT: Head: NCAT.  Right Ear: External ear normal.  Left Ear: External ear normal.  Eyes: . Pupils are equal, round, and reactive to light. Conjunctivae and EOM are normal Nose: without d/c or deformity Neck: Neck supple. Gross normal ROM Cardiovascular: Normal rate and regular rhythm.   Pulmonary/Chest: Effort normal and breath sounds without rales or wheezing.  Abd:  Soft, NT, ND, + BS, no organomegaly Neurological: Pt  is alert. At baseline orientation, motor grossly intact Skin: Skin is warm. No rashes, other new lesions, no LE edema Psychiatric: Pt behavior is normal without agitation but with 2+ nervous, mild depressed No other exam findings Lab Results  Component Value Date   WBC 6.3 05/09/2019   HGB 12.9 05/09/2019   HCT 39.1 05/09/2019   PLT 216 05/09/2019   GLUCOSE 101 (H) 05/10/2019   CHOL 186 05/01/2019   TRIG 74.0 05/01/2019   HDL 73.80 05/01/2019   LDLDIRECT 127.1 10/24/2010   LDLCALC 98 05/01/2019   ALT 10 05/01/2019   AST 12 05/01/2019   NA 140 05/10/2019   K 4.4 05/10/2019   CL 110 05/10/2019   CREATININE 0.81 05/10/2019   BUN 20 05/10/2019   CO2 25 05/10/2019   TSH 1.32 05/01/2019   INR 1.03 03/04/2011   HGBA1C 6.2 05/01/2019        Assessment & Plan:

## 2019-05-13 ENCOUNTER — Encounter: Payer: Self-pay | Admitting: Internal Medicine

## 2019-05-13 NOTE — Assessment & Plan Note (Signed)
In light of HTN, ok for toprol XL 25 qd,  to f/u any worsening symptoms or concerns

## 2019-05-13 NOTE — Assessment & Plan Note (Signed)
Uncontrolled, add toprol xl 25 qd

## 2019-05-13 NOTE — Assessment & Plan Note (Addendum)
Atypical, stable overall by history, and pt to continue medical treatment as before,  to f/u cardiology as planned  Note:  Total time for pt hx, exam, review of record with pt in the room, determination of diagnoses and plan for further eval and tx is > 40 min, with over 50% spent in coordination and counseling of patient including the differential dx, tx, further evaluation and other management of chest pain, uncontrolled, HTN, palpitations, and anxiety/depression

## 2019-05-13 NOTE — Assessment & Plan Note (Signed)
Moderate persistent, no SI or HI, for celexa asd,  to f/u any worsening symptoms or concerns

## 2019-05-17 ENCOUNTER — Encounter: Payer: Medicare Other | Admitting: Internal Medicine

## 2019-05-31 ENCOUNTER — Encounter: Payer: Self-pay | Admitting: Cardiology

## 2019-05-31 ENCOUNTER — Ambulatory Visit: Payer: Medicare Other | Admitting: Cardiology

## 2019-05-31 ENCOUNTER — Other Ambulatory Visit: Payer: Self-pay

## 2019-05-31 VITALS — BP 173/90 | HR 63 | Temp 97.5°F | Ht 59.0 in | Wt 138.6 lb

## 2019-05-31 DIAGNOSIS — I6523 Occlusion and stenosis of bilateral carotid arteries: Secondary | ICD-10-CM

## 2019-05-31 DIAGNOSIS — R0789 Other chest pain: Secondary | ICD-10-CM

## 2019-05-31 DIAGNOSIS — E7800 Pure hypercholesterolemia, unspecified: Secondary | ICD-10-CM

## 2019-05-31 DIAGNOSIS — R002 Palpitations: Secondary | ICD-10-CM

## 2019-05-31 DIAGNOSIS — Z9889 Other specified postprocedural states: Secondary | ICD-10-CM

## 2019-05-31 DIAGNOSIS — R9431 Abnormal electrocardiogram [ECG] [EKG]: Secondary | ICD-10-CM

## 2019-05-31 DIAGNOSIS — I10 Essential (primary) hypertension: Secondary | ICD-10-CM

## 2019-05-31 MED ORDER — SPIRONOLACTONE 25 MG PO TABS: 25.0000 mg | ORAL_TABLET | ORAL | 2 refills | Status: DC

## 2019-05-31 MED ORDER — AMLODIPINE BESYLATE 5 MG PO TABS: 5.0000 mg | ORAL_TABLET | Freq: Every day | ORAL | 2 refills | Status: DC

## 2019-05-31 MED ORDER — EZETIMIBE 10 MG PO TABS: 10.0000 mg | ORAL_TABLET | Freq: Every day | ORAL | 3 refills | Status: DC

## 2019-05-31 NOTE — Progress Notes (Signed)
Primary Physician/Referring:  Biagio Borg, MD  Patient ID: [name] [name], female    DOB: [birth year], 68 y.o.   MRN: [id number]  Chief Complaint  Patient presents with  . New Patient (Initial Visit)  . Chest Pain  . Palpitations  . Hospitalization Follow-up   HPI: [name] [name]  is a 68 y.o. female  with history of left carotid endarterectomy due to symptomatic carotid stenosis in 2012 with TIA, bronchial asthma, hypertension, hyperlipidemia, hyperglycemia admitted to the hospital on 05/09/2019 discharged home the following day when she presented with 1 week onset of intermittent palpitations lasting for 5 to 10 minutes, associated with dyspnea and left-sided chest tightness with radiation to the left arm.  Occasional episodes of dizziness and lightheadedness with the palpitations.  She was ruled out for myocardial infarction and discharged home.  She was evaluated by her PCP on 05/03/2019 and started on metoprolol succinate 25 mg daily and anti-anxiety medication.  She now presents for follow-up.   Most of the episodes are occurring after she is finished exertional activities but sometimes even at rest.  Episode last a few minutes and associated with again chest tightness.  Past Medical History:  Diagnosis Date  . ALCOHOL ABUSE, HX OF 11/06/2007  . ALLERGIC RHINITIS 11/06/2007  . Anxiety 04/03/2011  . ASTHMA 09/13/2007  . Chronic pain syndrome 12/15/2016  . COLONIC POLYPS, HX OF 02/09/2008    ADENOMATOUS POLYP  . Encounter for well adult exam without abnormal findings 04/03/2011  . HYPERLIPIDEMIA 11/03/2010  . HYPERTENSION 09/13/2007  . HYPOTHYROIDISM 11/06/2007  . Impaired glucose tolerance 07/30/2013  . INSOMNIA, HX OF 09/13/2007  . Lumbar degenerative disc disease 12/15/2016  . OSTEOPOROSIS 11/06/2007  . PERIMENOPAUSAL STATUS 09/13/2007  . RLS (restless legs syndrome)   . Stroke ([place])   . TIA (transient ischemic attack) 11/04/2011    Past Surgical History:  Procedure  Laterality Date  . BREAST EXCISIONAL BIOPSY     left  . BREAST SURGERY  2008 and 2012   x 2 - benign, left side  . carotid artery surgery Left   . CESAREAN SECTION     x 3  . COLONOSCOPY  multiple   2010    Social History   Socioeconomic History  . Marital status: Divorced    Spouse name: Not on file  . Number of children: 3  . Years of education: Not on file  . Highest education level: Not on file  Occupational History  . Occupation: Nurse, mental health: [employer]  Social Needs  . Financial resource strain: Not on file  . Food insecurity    Worry: Not on file    Inability: Not on file  . Transportation needs    Medical: Not on file    Non-medical: Not on file  Tobacco Use  . Smoking status: Former Smoker    Packs/day: 0.25    Types: Cigarettes    Quit date: 05/31/1979    Years since quitting: 40.0  . Smokeless tobacco: Never Used  . Tobacco comment: social smoker  Substance and Sexual Activity  . Alcohol use: No  . Drug use: No  . Sexual activity: Not on file  Lifestyle  . Physical activity    Days per week: Not on file    Minutes per session: Not on file  . Stress: Not on file  Relationships  . Social Herbalist on phone: Not on file    Gets together:  Not on file    Attends religious service: Not on file    Active member of club or organization: Not on file    Attends meetings of clubs or organizations: Not on file    Relationship status: Not on file  . Intimate partner violence    Fear of current or ex partner: Not on file    Emotionally abused: Not on file    Physically abused: Not on file    Forced sexual activity: Not on file  Other Topics Concern  . 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  Cardiovascular: Positive for chest pain and palpitations. Negative for dyspnea on exertion, leg swelling and syncope.  Endocrine: Negative for cold intolerance.  Hematologic/Lymphatic: Does not bruise/bleed easily.  Musculoskeletal: Negative for joint swelling.  Gastrointestinal: Negative for abdominal pain, anorexia, change in bowel habit, hematochezia and melena.  Neurological: Negative for headaches and light-headedness.  Psychiatric/Behavioral: Negative for depression and substance abuse.  All other systems reviewed and are negative.     Objective  Blood pressure (!) 173/90, pulse 63, temperature (!) 97.5 F (36.4 C), height 4\' 11"  (1.499 m), weight 138 lb 9.6 oz (62.9 kg), SpO2 98 %. Body mass index is 27.99 kg/m.    Physical Exam  Constitutional: She appears well-nourished. No distress.  Short stature  HENT:  Head: Atraumatic.  Eyes: Conjunctivae are normal.  Neck: Neck supple. No JVD present. No thyromegaly present.  Cardiovascular: Normal rate, regular rhythm, S1 normal, S2 normal, intact distal pulses and normal pulses. Exam reveals no gallop.  No murmur heard. Pulses:      Carotid pulses are on the right side with bruit. Left carotid endarterectomy scar noted  Pulmonary/Chest: Effort normal and breath sounds normal.  Abdominal: Soft. Bowel sounds are normal.  Musculoskeletal: Normal range of motion.        General: No edema.  Neurological: She is alert.  Skin: Skin is warm and dry.  Psychiatric: She has a normal mood and affect.   Radiology: No results found.  Laboratory examination:   CMP Latest Ref Rng & Units 05/10/2019 05/09/2019 05/01/2019  Glucose 70 - 99 mg/dL 101(H) 96 97  BUN 8 - 23 mg/dL 20 20 9   Creatinine 0.44 - 1.00 mg/dL 0.81 0.88 0.84  Sodium 135 - 145 mmol/L 140 140 140  Potassium 3.5 - 5.1 mmol/L 4.4 3.3(L) 3.9  Chloride 98 - 111 mmol/L 110 107 102  CO2 22 - 32 mmol/L 25 25 29   Calcium 8.9 - 10.3 mg/dL 8.7(L)  9.2 9.1  Total Protein 6.0 - 8.3 g/dL - - 6.9  Total Bilirubin 0.2 - 1.2 mg/dL - - 0.6  Alkaline Phos 39 - 117 U/L - - 101  AST 0 - 37 U/L - - 12  ALT 0 - 35 U/L - - 10   CBC Latest Ref Rng & Units 05/09/2019 05/01/2019 04/19/2018  WBC 4.0 - 10.5 K/uL 6.3 4.5 5.9  Hemoglobin 12.0 - 15.0 g/dL 12.9 13.2 13.7  Hematocrit 36.0 - 46.0 % 39.1 38.8 41.2  Platelets 150 - 400 K/uL 216 198.0 222.0   Lipid Panel     Component Value Date/Time   CHOL 186 05/01/2019 1312   TRIG 74.0 05/01/2019 1312  TRIG 50 10/29/2006 1025   HDL 73.80 05/01/2019 1312   CHOLHDL 3 05/01/2019 1312   VLDL 14.8 05/01/2019 1312   LDLCALC 98 05/01/2019 1312   LDLDIRECT 127.1 10/24/2010 0910   HEMOGLOBIN A1C Lab Results  Component Value Date   HGBA1C 6.2 05/01/2019   MPG 128 (H) 03/04/2011   TSH Recent Labs    10/20/18 1024 05/01/19 1312  TSH 1.48 1.32     Medications   Medications Discontinued During This Encounter  Medication Reason  . potassium chloride (MICRO-K) 10 MEQ CR capsule Discontinued by provider   Current Meds  Medication Sig  . acetaminophen (TYLENOL) 325 MG tablet Take 650 mg by mouth every 6 (six) hours as needed for mild pain.   Marland Kitchen atorvastatin (LIPITOR) 40 MG tablet Take 1 tablet (40 mg total) by mouth daily.  . benazepril-hydrochlorthiazide (LOTENSIN HCT) 10-12.5 MG tablet Take 1 tablet by mouth daily.  . citalopram (CELEXA) 10 MG tablet Take 1 tablet (10 mg total) by mouth daily.  . clopidogrel (PLAVIX) 75 MG tablet TAJE 1 TABLET BY MOUTH DAILY. (Patient taking differently: Take 75 mg by mouth daily. )  . levalbuterol (XOPENEX HFA) 45 MCG/ACT inhaler Inhale 2 puffs into the lungs 4 (four) times daily.  Marland Kitchen levothyroxine (SYNTHROID) 75 MCG tablet Take 1 tablet (75 mcg total) by mouth daily before breakfast.  . metoprolol succinate (TOPROL-XL) 25 MG 24 hr tablet Take 1 tablet (25 mg total) by mouth daily.  . montelukast (SINGULAIR) 10 MG tablet 1 tab by mouth daily (Patient taking  differently: Take 10 mg by mouth daily. )  . rOPINIRole (REQUIP) 0.5 MG tablet Take 0.5 tablets (0.25 mg total) by mouth at bedtime.  . traMADol (ULTRAM) 50 MG tablet Take 1 tablet (50 mg total) by mouth every 8 (eight) hours as needed. (Patient taking differently: Take 50 mg by mouth every 8 (eight) hours as needed for moderate pain. )  . zolpidem (AMBIEN) 10 MG tablet Take 1 tablet (10 mg total) by mouth at bedtime as needed for up to 30 days for sleep. (Patient taking differently: Take 10 mg by mouth at bedtime. )  . [DISCONTINUED] potassium chloride (MICRO-K) 10 MEQ CR capsule TAKE 1 TABLET BY MOUTH DAILY. (Patient taking differently: Take 10 mEq by mouth daily. )    Cardiac Studies:   Echocardiogram 8/41/3244:  Normal systolic function, EF 01%.  Mild LVH.  Grade 2 diastolic dysfunction.  Moderately dilated left atrium. Mild thickening of mitral valve with mild calcification.  Mild aortic valve weakening and calcification.  Carotid artery duplex 11/07/2014: 60-79% stenosis of the right ACA and 1-39% stenosis of the left ICA S/P CEA.  Bilateral antegrade vertebral flow. Normal subtenon arteries bilaterally.  Assessment   Palpitations - Plan: EKG 12-Lead  Atypical chest pain - Plan: PCV MYOCARDIAL PERFUSION WITH LEXISCAN  Shortened PR interval - Plan: PCV MYOCARDIAL PERFUSION WITH LEXISCAN  H/O left carotid endarterectomy 03/09/11  Asymptomatic bilateral carotid artery stenosis - Plan: PCV CAROTID DUPLEX (BILATERAL)  Essential hypertension - Plan: spironolactone (ALDACTONE) 25 MG tablet, amLODipine (NORVASC) 5 MG tablet,  Hypercholesteremia - Plan: ezetimibe (ZETIA) 10 MG tablet,   EKG 05/31/2019: Sinus rhythm with short PR interval, PR interval 116 ms.  Possible left atrial abnormality.  No evidence of ischemia, normal QT interval.  Recommendations:   Patient presents to me for evaluation of rapid palpitations associated with chest pain with radiation to her left arm, suggestive  of angina pectoris, some features are very atypical in that  they're occurring during rest.  However she does have significant cardiovascular risk factors that includes prior Left carotid endarterectomy and Right carotid stenosis and prior TIA and stroke, Hypertension, hyperlipidemia and hyperglycemia as well.  I will set up for a Event/Holter monitor for 15 days especially in view of short PR interval. Explained how to use it and to activate the device. Schedule for a Lexiscan Sestamibi stress test to evaluate for myocardial ischemia. Patient unable to do treadmill stress testing due to COVID 19 avoid exercise nuclear stress, Will schedule for an echocardiogram. Schedule for carotid duplex for bruit/follow-up surveillance of carotid stenosis.   Blood pressure is markedly elevated, we'll start amlodipine 10 mg daily along with spironolactone 25 mg in the morning and discontinue potassium supplement.  Add Zetia as her lipids are not controlled and not at goal.  Will obtain follow-up BMP in 10 days to 2 weeks and I'll see her back in the office and will monitor her lipids in 6-8 weeks.  This is a complex office visit with greater than 60 minutes duration addressing the above issues with greater than 50% of the time spent with direct face-to-face interaction.   Adrian Prows, MD, [facility] 05/31/2019, 10:47 AM [facility] [place] Pager: [phone] Office: [phone] If no answer Cell [phone]

## 2019-06-02 ENCOUNTER — Other Ambulatory Visit: Payer: Self-pay

## 2019-06-02 ENCOUNTER — Ambulatory Visit: Payer: Medicare Other

## 2019-06-02 DIAGNOSIS — R002 Palpitations: Secondary | ICD-10-CM

## 2019-06-10 LAB — BASIC METABOLIC PANEL
BUN/Creatinine Ratio: 12 (ref 12–28)
BUN: 12 mg/dL (ref 8–27)
CO2: 23 mmol/L (ref 20–29)
Calcium: 9.6 mg/dL (ref 8.7–10.3)
Chloride: 100 mmol/L (ref 96–106)
Creatinine, Ser: 1 mg/dL (ref 0.57–1.00)
GFR calc Af Amer: 67 mL/min/{1.73_m2} (ref >59)
GFR calc non Af Amer: 58 mL/min/{1.73_m2} — ABNORMAL LOW (ref >59)
Glucose: 99 mg/dL (ref 65–99)
Potassium: 4.1 mmol/L (ref 3.5–5.2)
Sodium: 139 mmol/L (ref 134–144)

## 2019-06-10 NOTE — Addendum Note (Signed)
Addended by: Kela Millin on: 06/10/2019 08:00 AM   Modules accepted: Orders

## 2019-06-26 ENCOUNTER — Other Ambulatory Visit: Payer: Medicare Other

## 2019-06-26 ENCOUNTER — Ambulatory Visit (INDEPENDENT_AMBULATORY_CARE_PROVIDER_SITE_OTHER): Payer: Medicare Other

## 2019-06-26 ENCOUNTER — Other Ambulatory Visit: Payer: Self-pay

## 2019-06-26 DIAGNOSIS — R0789 Other chest pain: Secondary | ICD-10-CM

## 2019-06-26 DIAGNOSIS — R9431 Abnormal electrocardiogram [ECG] [EKG]: Secondary | ICD-10-CM

## 2019-07-07 ENCOUNTER — Ambulatory Visit (INDEPENDENT_AMBULATORY_CARE_PROVIDER_SITE_OTHER): Payer: Self-pay

## 2019-07-07 ENCOUNTER — Other Ambulatory Visit: Payer: Self-pay | Admitting: Internal Medicine

## 2019-07-07 ENCOUNTER — Other Ambulatory Visit: Payer: Self-pay

## 2019-07-07 DIAGNOSIS — I6523 Occlusion and stenosis of bilateral carotid arteries: Secondary | ICD-10-CM | POA: Diagnosis not present

## 2019-07-07 DIAGNOSIS — Z1231 Encounter for screening mammogram for malignant neoplasm of breast: Secondary | ICD-10-CM

## 2019-07-09 ENCOUNTER — Other Ambulatory Visit: Payer: Self-pay | Admitting: Cardiology

## 2019-07-09 DIAGNOSIS — I6523 Occlusion and stenosis of bilateral carotid arteries: Secondary | ICD-10-CM

## 2019-07-25 ENCOUNTER — Other Ambulatory Visit: Payer: Self-pay | Admitting: Neurology

## 2019-07-25 ENCOUNTER — Telehealth: Payer: Self-pay | Admitting: Neurology

## 2019-07-25 MED ORDER — ROPINIROLE HCL 0.5 MG PO TABS: 0.5000 mg | ORAL_TABLET | Freq: Every day | ORAL | 1 refills | Status: DC

## 2019-07-25 NOTE — Telephone Encounter (Signed)
Called the pt and I have corrected the script as per our conversation in Feb.

## 2019-07-25 NOTE — Telephone Encounter (Signed)
Pt is asking for a cal from RN to discuss her ROPINIRole (REQUIP) 0.5 MG tablet she said the pharmacy told her she has been taking it wrong and she is out.  Please call

## 2019-08-01 ENCOUNTER — Other Ambulatory Visit: Payer: Self-pay | Admitting: Cardiology

## 2019-08-01 DIAGNOSIS — I10 Essential (primary) hypertension: Secondary | ICD-10-CM

## 2019-08-03 ENCOUNTER — Encounter: Payer: Self-pay | Admitting: Cardiology

## 2019-08-03 ENCOUNTER — Ambulatory Visit (INDEPENDENT_AMBULATORY_CARE_PROVIDER_SITE_OTHER): Payer: Self-pay | Admitting: Cardiology

## 2019-08-03 ENCOUNTER — Other Ambulatory Visit: Payer: Self-pay

## 2019-08-03 VITALS — BP 127/65 | HR 63 | Ht 59.0 in | Wt 143.1 lb

## 2019-08-03 DIAGNOSIS — R002 Palpitations: Secondary | ICD-10-CM

## 2019-08-03 DIAGNOSIS — I10 Essential (primary) hypertension: Secondary | ICD-10-CM

## 2019-08-03 DIAGNOSIS — I6523 Occlusion and stenosis of bilateral carotid arteries: Secondary | ICD-10-CM | POA: Diagnosis not present

## 2019-08-03 DIAGNOSIS — E7800 Pure hypercholesterolemia, unspecified: Secondary | ICD-10-CM

## 2019-08-03 DIAGNOSIS — R0789 Other chest pain: Secondary | ICD-10-CM

## 2019-08-03 DIAGNOSIS — R9431 Abnormal electrocardiogram [ECG] [EKG]: Secondary | ICD-10-CM | POA: Diagnosis not present

## 2019-08-03 MED ORDER — EZETIMIBE 10 MG PO TABS: 10.0000 mg | ORAL_TABLET | Freq: Every day | ORAL | 1 refills | Status: DC

## 2019-08-03 NOTE — Progress Notes (Signed)
Primary Physician/Referring:  Biagio Borg, MD  Patient ID: [name] [name], female    DOB: [birth year], 68 y.o.   MRN: [id number]  Chief Complaint  Patient presents with  . Hypertension  . Follow-up    after testing   HPI: [name] [name]  is a 68 y.o. female  with history of left carotid endarterectomy due to symptomatic carotid stenosis in 2012 with TIA, bronchial asthma, hypertension, hyperlipidemia, hyperglycemia admitted to the hospital on 05/09/2019 discharged home the following day when she presented with 1 week onset of intermittent palpitations lasting for 5 to 10 minutes, associated with dyspnea and left-sided chest tightness with radiation to the left arm.  I'd seen a month ago, I started her on spironolactone for hypertension, Discontinued potassium supplement, performed event monitor, echocardiogram and nuclear stress test and she now presents for follow-up.  I'd also started her on Zetia.  States that she is tolerating all the medications well, has noticed marked improvement in blood pressure since then.  Fortunately has not had any further chest pain, but she has had brief episodes of palpitations.  No dizziness or syncope.  Past Medical History:  Diagnosis Date  . ALCOHOL ABUSE, HX OF 11/06/2007  . ALLERGIC RHINITIS 11/06/2007  . Anxiety 04/03/2011  . ASTHMA 09/13/2007  . Chronic pain syndrome 12/15/2016  . COLONIC POLYPS, HX OF 02/09/2008    ADENOMATOUS POLYP  . Encounter for well adult exam without abnormal findings 04/03/2011  . HYPERLIPIDEMIA 11/03/2010  . HYPERTENSION 09/13/2007  . HYPOTHYROIDISM 11/06/2007  . Impaired glucose tolerance 07/30/2013  . INSOMNIA, HX OF 09/13/2007  . Lumbar degenerative disc disease 12/15/2016  . OSTEOPOROSIS 11/06/2007  . PERIMENOPAUSAL STATUS 09/13/2007  . RLS (restless legs syndrome)   . Stroke ([place])   . TIA (transient ischemic attack) 11/04/2011    Past Surgical History:  Procedure Laterality Date  . BREAST EXCISIONAL BIOPSY     left  . BREAST SURGERY  2008 and 2012   x 2 - benign, left side  . carotid artery surgery Left   . CESAREAN SECTION     x 3  . COLONOSCOPY  multiple   2010    Social History   Socioeconomic History  . Marital status: Divorced    Spouse name: Not on file  . Number of children: 3  . Years of education: Not on file  . Highest education level: Not on file  Occupational History  . Occupation: Nurse, mental health: [employer]  Social Needs  . Financial resource strain: Not on file  . Food insecurity    Worry: Not on file    Inability: Not on file  . Transportation needs    Medical: Not on file    Non-medical: Not on file  Tobacco Use  . Smoking status: Former Smoker    Packs/day: 0.25    Types: Cigarettes    Quit date: 05/31/1979    Years since quitting: 40.2  . Smokeless tobacco: Never Used  . Tobacco comment: social smoker  Substance and Sexual Activity  . Alcohol use: No  . Drug use: No  . Sexual activity: Not on file  Lifestyle  . Physical activity    Days per week: Not on file    Minutes per session: Not on file  . Stress: Not on file  Relationships  . Social Herbalist on phone: Not on file    Gets together: Not on file    Attends  religious service: Not on file    Active member of club or organization: Not on file    Attends meetings of clubs or organizations: Not on file    Relationship status: Not on file  . Intimate partner violence    Fear of current or ex partner: Not on file    Emotionally abused: Not on file    Physically abused: Not on file    Forced sexual activity: Not on file  Other Topics Concern  . 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  Cardiovascular:  Positive for palpitations. Negative for chest pain, dyspnea on exertion, leg swelling and syncope.  Endocrine: Negative for cold intolerance.  Hematologic/Lymphatic: Does not bruise/bleed easily.  Musculoskeletal: Negative for joint swelling.  Gastrointestinal: Negative for abdominal pain, anorexia, change in bowel habit, hematochezia and melena.  Neurological: Negative for headaches and light-headedness.  Psychiatric/Behavioral: Negative for depression and substance abuse.  All other systems reviewed and are negative.  Objective  Blood pressure 127/65, pulse 63, height _0  (1.499 m), weight 143 lb 1.6 oz (64.9 kg), SpO2 95 %. Body mass index is 28.9 kg/m.    Physical Exam  Constitutional: She appears well-nourished. No distress.  Short stature  HENT:  Head: Atraumatic.  Eyes: Conjunctivae are normal.  Neck: Neck supple. No JVD present. No thyromegaly present.  Cardiovascular: Normal rate, regular rhythm, S1 normal, S2 normal, intact distal pulses and normal pulses. Exam reveals no gallop.  No murmur heard. Pulses:      Carotid pulses are on the right side with bruit. Left carotid endarterectomy scar noted  Pulmonary/Chest: Effort normal and breath sounds normal.  Abdominal: Soft. Bowel sounds are normal.  Musculoskeletal: Normal range of motion.        General: No edema.  Neurological: She is alert.  Skin: Skin is warm and dry.  Psychiatric: She has a normal mood and affect.   Radiology: No results found.  Laboratory examination:   CMP Latest Ref Rng & Units 06/09/2019 05/10/2019 05/09/2019  Glucose 65 - 99 mg/dL 99 101(H) 96  BUN 8 - 27 mg/dL _1 Creatinine 0.57 - 1.00 mg/dL 1.00 0.81 0.88  Sodium 134 - 144 mmol/L 139 140 140  Potassium 3.5 - 5.2 mmol/L 4.1 4.4 3.3(L)  Chloride 96 - 106 mmol/L 100 110 107  CO2 20 - 29 mmol/L _2 Calcium 8.7 - 10.3 mg/dL 9.6 8.7(L) 9.2  Total Protein 6.0 - 8.3 g/dL - - -  Total Bilirubin 0.2 - 1.2 mg/dL - - -  Alkaline  Phos 39 - 117 U/L - - -  AST 0 - 37 U/L - - -  ALT 0 - 35 U/L - - -   CBC Latest Ref Rng & Units 05/09/2019 05/01/2019 04/19/2018  WBC 4.0 - 10.5 K/uL 6.3 4.5 5.9  Hemoglobin 12.0 - 15.0 g/dL 12.9 13.2 13.7  Hematocrit 36.0 - 46.0 % 39.1 38.8 41.2  Platelets 150 - 400 K/uL 216 198.0 222.0   Lipid Panel     Component Value Date/Time   CHOL 186 05/01/2019 1312   TRIG 74.0 05/01/2019 1312   TRIG 50 10/29/2006 1025   HDL 73.80 05/01/2019 1312   CHOLHDL 3 05/01/2019 1312  VLDL 14.8 05/01/2019 1312   LDLCALC 98 05/01/2019 1312   LDLDIRECT 127.1 10/24/2010 0910   HEMOGLOBIN A1C Lab Results  Component Value Date   HGBA1C 6.2 05/01/2019   MPG 128 (H) 03/04/2011   TSH Recent Labs    10/20/18 1024 05/01/19 1312  TSH 1.48 1.32     Medications   Medications Discontinued During This Encounter  Medication Reason  . ezetimibe (ZETIA) 10 MG tablet Reorder   Current Meds  Medication Sig  . acetaminophen (TYLENOL) 325 MG tablet Take 650 mg by mouth every 6 (six) hours as needed for mild pain.   Marland Kitchen amLODipine (NORVASC) 5 MG tablet TAKE 1 TABLET (5 MG TOTAL) BY MOUTH DAILY AFTER SUPPER.  Marland Kitchen atorvastatin (LIPITOR) 40 MG tablet Take 1 tablet (40 mg total) by mouth daily.  . benazepril-hydrochlorthiazide (LOTENSIN HCT) 10-12.5 MG tablet Take 1 tablet by mouth daily.  . citalopram (CELEXA) 10 MG tablet Take 1 tablet (10 mg total) by mouth daily.  . clopidogrel (PLAVIX) 75 MG tablet TAJE 1 TABLET BY MOUTH DAILY. (Patient taking differently: Take 75 mg by mouth daily. )  . ezetimibe (ZETIA) 10 MG tablet Take 1 tablet (10 mg total) by mouth daily after supper.  . levalbuterol (XOPENEX HFA) 45 MCG/ACT inhaler Inhale 2 puffs into the lungs 4 (four) times daily.  Marland Kitchen levothyroxine (SYNTHROID) 75 MCG tablet Take 1 tablet (75 mcg total) by mouth daily before breakfast.  . metoprolol succinate (TOPROL-XL) 25 MG 24 hr tablet Take 1 tablet (25 mg total) by mouth daily.  . montelukast (SINGULAIR) 10 MG  tablet 1 tab by mouth daily (Patient taking differently: Take 10 mg by mouth daily. )  . rOPINIRole (REQUIP) 0.5 MG tablet Take 1 tablet (0.5 mg total) by mouth at bedtime.  Marland Kitchen spironolactone (ALDACTONE) 25 MG tablet TAKE 1 TABLET BY MOUTH EVERY DAY IN THE MORNING  . traMADol (ULTRAM) 50 MG tablet Take 1 tablet (50 mg total) by mouth every 8 (eight) hours as needed. (Patient taking differently: Take 50 mg by mouth every 8 (eight) hours as needed for moderate pain. )  . zolpidem (AMBIEN) 10 MG tablet Take 1 tablet (10 mg total) by mouth at bedtime as needed for up to 30 days for sleep. (Patient taking differently: Take 10 mg by mouth at bedtime. )  . [DISCONTINUED] ezetimibe (ZETIA) 10 MG tablet Take 1 tablet (10 mg total) by mouth daily after supper.    Cardiac Studies:  Event Monitor for 14 days Start date 06/02/2019:  Predominant rhythm is normal sinus rhythm.:  Symptomatic transmissions of flutter and chest pain revealed normal sinus rhythm.  Automatic detected event revealed one episode of 12 beat (8 Sec) wide complex tachycardia suggestive of NSVT at 116 bpm at 2:30 PM on 06/10/2019.  Lexiscan Myoview Stress Test 06/26/2019: Resting EKG demonstrates sinus rhythm with short PR interval, borderline criteria for LVH.   stress EKG is nondiagnostic for ischemia as his of alcoholic stress test. Symptoms during test were SOB and Dizziness.  Myocardial pefusion imaging is normal. Left ventricular ejection fraction is  67% with normal wall motion. Low risk study.  Carotid artery duplex  07/07/2019: Stenosis in the right internal carotid artery (16-49%). Minimal stenosis in the left internal carotid artery (minimal). Patent left endarterectomy site.  Antegrade right vertebral artery flow. Antegrade left vertebral artery flow. Follow up in one year is appropriate if clinically indicated.  Echocardiogram 5/39/7673:  Normal systolic function, EF 41%.  Mild LVH.  Grade 2 diastolic  dysfunction.   Moderately dilated left atrium. Mild thickening of mitral valve with mild calcification.  Mild aortic valve weakening and calcification.  Assessment     ICD-10-CM   1. Atypical chest pain  R07.89   2. Asymptomatic bilateral carotid artery stenosis  I65.23   3. Palpitations  R00.2   4. Shortened PR interval  R94.31   5. Hypercholesteremia  E78.00 Lipid Panel With LDL/HDL Ratio    LDL cholesterol, direct    ezetimibe (ZETIA) 10 MG tablet  6. Essential hypertension  I10 CMP14+EGFR    EKG 05/31/2019: Sinus rhythm with short PR interval, PR interval 116 ms.  Possible left atrial abnormality.  No evidence of ischemia, normal QT interval.  Recommendations:  Patient presents to me for follow-up of palpitations, carotid stenosis, atypical chest pain.  Since being on spironolactone, blood pressure is now very well controlled.  I have also added Zetia on her prior office visit which is also tolerating this medication without any side effects.  Will obtain lipids and CMP in 2 months.  As she remains asymptomatic except for occasional palpitations which I reviewed the results of the event monitor which reveals brief atrial tachycardia, will continue observation only.  Only if the palpitations persisted for greater than 5-10 minutes she is advised to contact us back.   Adrian Prows, MD, [facility] 08/03/2019, 3:14 PM [facility]. [place] Pager: [phone] Office: [phone] If no answer Cell [phone]

## 2019-08-07 ENCOUNTER — Telehealth: Payer: Self-pay | Admitting: Internal Medicine

## 2019-08-07 NOTE — Telephone Encounter (Signed)
Ok with me 

## 2019-08-07 NOTE — Telephone Encounter (Signed)
Copied from [place]. Topic: Quick Communication - See Telephone Encounter >> Aug 07, 2019 11:21 AM [place] wrote: CRM for notification. See Telephone encounter for: 08/07/19. Pt wants to switch from [name] to [place]. Pls FU  at [phone]

## 2019-08-10 ENCOUNTER — Encounter: Payer: Self-pay | Admitting: Family Medicine

## 2019-08-10 ENCOUNTER — Ambulatory Visit (INDEPENDENT_AMBULATORY_CARE_PROVIDER_SITE_OTHER): Payer: Medicare Other | Admitting: Family Medicine

## 2019-08-10 ENCOUNTER — Other Ambulatory Visit: Payer: Self-pay

## 2019-08-10 ENCOUNTER — Ambulatory Visit: Payer: Self-pay

## 2019-08-10 VITALS — BP 122/78 | HR 51 | Ht 59.0 in | Wt 144.0 lb

## 2019-08-10 DIAGNOSIS — M533 Sacrococcygeal disorders, not elsewhere classified: Secondary | ICD-10-CM

## 2019-08-10 DIAGNOSIS — M5136 Other intervertebral disc degeneration, lumbar region: Secondary | ICD-10-CM

## 2019-08-10 NOTE — Progress Notes (Signed)
[name] [address], [place] [address] Phone: [phone] Subjective:   Fontaine No, am serving as a scribe for Dr. Hulan Saas.   CC: Back pain, sacroiliac joints  [id number]   05/01/2019 Bilateral injections given today.  Patient did have significant amount of decreasing pain immediately.  Patient has had many different health problems including a chronic pain syndrome.  Known degenerative disc disease that is fairly severe in the back.  Still concern more of a lumbar radiculopathy contributing to more of her discomfort and pain.  We will talk about an MRI and epidurals after this.  Patient is in agreement with the plan.  Follow-up again in 3 to 4 weeks  Update 08/10/2019 [name] [name] is a 68 y.o. female coming in with complaint of lower back pain. Patient states that the past 2 weeks her pain has increased. Pain can radiate down into the glutes and hips. Injections did give her some relief.  Patient states that over the course the last 2 weeks started having increasing discomfort and pain again.  Affecting daily activities as well as her sleep.  Medications have not been helping.  Patient feels that the sacroiliac joint injections that she had previously had been the best results in quite some time.    Past Medical History:  Diagnosis Date  . ALCOHOL ABUSE, HX OF 11/06/2007  . ALLERGIC RHINITIS 11/06/2007  . Anxiety 04/03/2011  . ASTHMA 09/13/2007  . Chronic pain syndrome 12/15/2016  . COLONIC POLYPS, HX OF 02/09/2008    ADENOMATOUS POLYP  . Encounter for well adult exam without abnormal findings 04/03/2011  . HYPERLIPIDEMIA 11/03/2010  . HYPERTENSION 09/13/2007  . HYPOTHYROIDISM 11/06/2007  . Impaired glucose tolerance 07/30/2013  . INSOMNIA, HX OF 09/13/2007  . Lumbar degenerative disc disease 12/15/2016  . OSTEOPOROSIS 11/06/2007  . PERIMENOPAUSAL STATUS 09/13/2007  . RLS (restless legs syndrome)   . Stroke ([place])   . TIA (transient  ischemic attack) 11/04/2011   Past Surgical History:  Procedure Laterality Date  . BREAST EXCISIONAL BIOPSY     left  . BREAST SURGERY  2008 and 2012   x 2 - benign, left side  . carotid artery surgery Left   . CESAREAN SECTION     x 3  . COLONOSCOPY  multiple   2010   Social History   Socioeconomic History  . Marital status: Divorced    Spouse name: Not on file  . Number of children: 3  . Years of education: Not on file  . Highest education level: Not on file  Occupational History  . 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 Financial resource strain: Not on file  . Food insecurity    Worry: Not on file    Inability: Not on file  . Transportation needs    Medical: Not on file    Non-medical: Not on file  Tobacco Use  . Smoking status: Former Smoker    Packs/day: 0.25    Types: Cigarettes    Quit date: 05/31/1979    Years since quitting: 40.2  . Smokeless tobacco: Never Used  . Tobacco comment: social smoker  Substance and Sexual Activity  . Alcohol use: No  . Drug use: No  . Sexual activity: Not on file  Lifestyle  . Physical activity    Days per week: Not on file    Minutes per session: Not on file  . Stress:  Not on file  Relationships  . Social Herbalist on phone: Not on file    Gets together: Not on file    Attends religious service: Not on file    Active member of club or organization: Not on file    Attends meetings of clubs or organizations: Not on file    Relationship status: Not on file  Other Topics Concern  . 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 Fosamax [Alendronate Sodium] Nausea And Vomiting  . Penicillins Hives    Did it involve swelling of the face/tongue/throat, SOB, or low BP? No Did it involve sudden or  severe rash/hives, skin peeling, or any reaction on the inside of your mouth or nose? Yes Did you need to seek medical attention at a hospital or doctor's office? Yes When did it last happen?Over 10 years ago If all above answers are "NO", may proceed with cephalosporin use.   Family History  Problem Relation Age of Onset  . Hyperlipidemia Other   . Hypertension Other   . Coronary artery disease Other   . Breast cancer Cousin   . Hypertension Mother   . Heart disease Mother   . Heart failure Mother   . Dementia Maternal Grandmother   . Colon cancer Neg Hx     Current Outpatient Medications (Endocrine & Metabolic):  .  levothyroxine (SYNTHROID) 75 MCG tablet, Take 1 tablet (75 mcg total) by mouth daily before breakfast.  Current Outpatient Medications (Cardiovascular):  .  amLODipine (NORVASC) 5 MG tablet, TAKE 1 TABLET (5 MG TOTAL) BY MOUTH DAILY AFTER SUPPER. Marland Kitchen  atorvastatin (LIPITOR) 40 MG tablet, Take 1 tablet (40 mg total) by mouth daily. .  benazepril-hydrochlorthiazide (LOTENSIN HCT) 10-12.5 MG tablet, Take 1 tablet by mouth daily. Marland Kitchen  ezetimibe (ZETIA) 10 MG tablet, Take 1 tablet (10 mg total) by mouth daily after supper. .  metoprolol succinate (TOPROL-XL) 25 MG 24 hr tablet, Take 1 tablet (25 mg total) by mouth daily. Marland Kitchen  spironolactone (ALDACTONE) 25 MG tablet, TAKE 1 TABLET BY MOUTH EVERY DAY IN THE MORNING  Current Outpatient Medications (Respiratory):  .  levalbuterol (XOPENEX HFA) 45 MCG/ACT inhaler, Inhale 2 puffs into the lungs 4 (four) times daily. .  montelukast (SINGULAIR) 10 MG tablet, 1 tab by mouth daily (Patient taking differently: Take 10 mg by mouth daily. )  Current Outpatient Medications (Analgesics):  .  acetaminophen (TYLENOL) 325 MG tablet, Take 650 mg by mouth every 6 (six) hours as needed for mild pain.  .  traMADol (ULTRAM) 50 MG tablet, Take 1 tablet (50 mg total) by mouth every 8 (eight) hours as needed. (Patient taking differently: Take 50 mg by  mouth every 8 (eight) hours as needed for moderate pain. )  Current Outpatient Medications (Hematological):  .  clopidogrel (PLAVIX) 75 MG tablet, TAJE 1 TABLET BY MOUTH DAILY. (Patient taking differently: Take 75 mg by mouth daily. )  Current Outpatient Medications (Other):  .  citalopram (CELEXA) 10 MG tablet, Take 1 tablet (10 mg total) by mouth daily. Marland Kitchen  rOPINIRole (REQUIP) 0.5 MG tablet, Take 1 tablet (0.5 mg total) by mouth at bedtime. Marland Kitchen  zolpidem (AMBIEN) 10 MG tablet, Take 1 tablet (10 mg total) by mouth at bedtime as needed for up  to 30 days for sleep. (Patient taking differently: Take 10 mg by mouth at bedtime. )    Past medical history, social, surgical and family history all reviewed in electronic medical record.  No pertanent information unless stated regarding to the chief complaint.   Review of Systems:  No headache, visual changes, nausea, vomiting, diarrhea, constipation, dizziness, abdominal pain, skin rash, fevers, chills, night sweats, weight loss, swollen lymph nodes,  chest pain, shortness of breath, mood changes.  Positive muscle aches, body aches  Objective  Blood pressure 122/78, pulse (!) 51, height 4\' 11"  (1.499 m), weight 144 lb (65.3 kg), SpO2 99 %.    General: No apparent distress alert and oriented x3 mood and affect normal, dressed appropriately.  HEENT: Pupils equal, extraocular movements intact  Respiratory: Patient's speak in full sentences and does not appear short of breath  Cardiovascular: No lower extremity edema, non tender, no erythema  Skin: Warm dry intact with no signs of infection or rash on extremities or on axial skeleton.  Abdomen: Soft nontender  Neuro: Cranial nerves II through XII are intact, neurovascularly intact in all extremities with 2+ DTRs and 2+ pulses.  Lymph: No lymphadenopathy of posterior or anterior cervical chain or axillae bilaterally.  Gait normal with good balance and coordination.  MSK:  tender with limited range of  motion and good stability and symmetric strength and tone of shoulders, elbows, wrist, hip, knee and ankles bilaterally.   Back exam shows the patient does have moderate to severe tenderness to palpation over the sacroiliac joints bilaterally.  No fluid-filled.  Positive Faber test bilaterally.  Tightness of the hamstrings but no radicular symptoms.  Diffuse tenderness to palpation in the paraspinal musculature in the lumbar spine.   Procedure: Real-time Ultrasound Guided Injection of right sacroiliac joint Device: GE Logiq Q7 Ultrasound guided injection is preferred based studies that show increased duration, increased effect, greater accuracy, decreased procedural pain, increased response rate, and decreased cost with ultrasound guided versus blind injection.  Verbal informed consent obtained.  Time-out conducted.  Noted no overlying erythema, induration, or other signs of local infection.  Skin prepped in a sterile fashion.  Local anesthesia: Topical Ethyl chloride.  With sterile technique and under real time ultrasound guidance: With a 21-gauge 2 inch needle injected into the right sacroiliac joint with a total of 1 cc of 0.5% Marcaine and 1 cc of Kenalog 40 mg/mL Completed without difficulty  Pain immediately resolved suggesting accurate placement of the medication.  Advised to call if fevers/chills, erythema, induration, drainage, or persistent bleeding.  Images permanently stored and available for review in the ultrasound unit.  Impression: Technically successful ultrasound guided injection.  Procedure: Real-time Ultrasound Guided Injection of left sacroiliac joint Device: GE Logiq Q7 Ultrasound guided injection is preferred based studies that show increased duration, increased effect, greater accuracy, decreased procedural pain, increased response rate, and decreased cost with ultrasound guided versus blind injection.  Verbal informed consent obtained.  Time-out conducted.  Noted no  overlying erythema, induration, or other signs of local infection.  Skin prepped in a sterile fashion.  Local anesthesia: Topical Ethyl chloride.  With sterile technique and under real time ultrasound guidance: With a 21-gauge 2 inch needle injected with 0.5 cc of 0.5% Marcaine and 0.5 cc of Kenalog 40 mg/mL Completed without difficulty  Pain immediately resolved suggesting accurate placement of the medication.  Advised to call if fevers/chills, erythema, induration, drainage, or persistent bleeding.  Images permanently stored and available for review in the ultrasound  unit.  Impression: Technically successful ultrasound guided injection.    Impression and Recommendations:     This case required medical decision making of moderate complexity. The above documentation has been reviewed and is accurate and complete Lyndal Pulley, DO       Note: This dictation was prepared with Dragon dictation along with smaller phrase technology. Any transcriptional errors that result from this process are unintentional.

## 2019-08-10 NOTE — Patient Instructions (Signed)
Injected bilateral SI joints today Can do again in 3 months if needed See me in 3 months

## 2019-08-10 NOTE — Assessment & Plan Note (Signed)
Bilateral injections given today.  Tolerated procedure well.  Patient does have on Plavix and does not want to try the epidural.  Do believe that lumbar radiculopathy is still playing a role.  Discussed icing regimen and home exercises, core stability, continue all other medications.  Follow-up again in 2 to 3 months

## 2019-08-11 NOTE — Telephone Encounter (Signed)
Okay with me 

## 2019-08-14 NOTE — Telephone Encounter (Signed)
Scheduled

## 2019-08-24 ENCOUNTER — Ambulatory Visit
Admission: RE | Admit: 2019-08-24 | Discharge: 2019-08-24 | Disposition: A | Discharge status: Home / Self Care | Payer: Medicare Other | Source: Ambulatory Visit | Attending: Internal Medicine | Admitting: Internal Medicine

## 2019-08-24 ENCOUNTER — Other Ambulatory Visit: Payer: Self-pay

## 2019-08-24 DIAGNOSIS — Z1231 Encounter for screening mammogram for malignant neoplasm of breast: Secondary | ICD-10-CM

## 2019-10-01 NOTE — Progress Notes (Signed)
PATIENT: [name] [name] DOB: 21-Oct-1951  REASON FOR VISIT: follow up HISTORY FROM: patient  Chief Complaint  Patient presents with   Follow-up    Yearly f/u. Alone. Rm 8. No new concerns at this time.      HISTORY OF PRESENT ILLNESS: Today 10/02/19 [name] [name] is a 68 y.o. female here today for follow up for insomnia, RLS and PLMD. She continues requip 0.5mg  at bedtime. She is also taking Ambien 10mg  nighty prescribed by her PCP. She also takes two Tylenol at night and this seems to help. She is sleeping better than she has in the past. She is working with PCP for anxiety and depression. She is doing well on Celexa 10mg  daily.   HISTORY: (copied from Dr Dohmeier's note on 09/27/2018)  HPI: I have the pleasure of meeting Ms. [name] today, a 68 year old African-American right-handed lady that I had seen in a consultation earlier this year.  Today is 27 June 2018 and we are looking at the baseline polysomnography report.  The patient was evaluated on 17 May 2018 her sleep study showed no significant apnea her AHI was 1.2, the same was true for the respiratory disturbance index which indicates that she also does not snore.  She did not have oxygen desaturations, she did however have frequent leg movements and kicking.  Some of these aroused her not to full awareness but from deep sleep stages into lighter or drowsiness stages.  Most arousals were spontaneous.  Her periodic limb movements also manifest as restless legs contributing to a very long latency before she entered sustain sleep. Ferritin, TSH and free iron will be obtained, and I confirmed that the patient is not diabetic, she takes synthroid.    Vivie Geddes [name] is a 68 y.o. female , seen here as in a referral  from [name]/ [name] for a sleep disorder evaluation.  Chief complaint according to patient : " Ever since I went through menopause and cannot sleep, I wondered why" I cannot shut my brain off, and I  stay busy all day - I cannot nap in daytime either" .Sometimes she stays awake for 2-3 days ... [name] prescribed Ambien.  I have the pleasure of meeting [name] today on 25 Apr 2018, she is a 68 year old African-American female and recently retired Radio producer.  Since her retirement she keeps her to toddler grandchildren in daytime, keeps very active and still feels she cannot sleep well sometimes at night not at all.  She presented to her primary care physician's office in March with headaches, as found to have low BP.  [name] asked her to stop 1 of her blood pressure medicines, she was on a combination of a calcium channel blocker and ARB.  Her blood pressure finally came back up but 2 days later it was back down again.  She did not feel really dizzy or lightheaded but the headache was her main symptom.  She had some tingling in her right upper extremity and reportedly has a history of prior TIAs.  She remains on Plavix, Lipitor, hydrochlorothiazide, on an albuterol inhaler for asthma as she claims, she is on Dulera, Singulair, potassium supplement, she takes tramadol-Ultram as needed for pain, Allegra, she takes Ambien 5 mg at night, Synthroid.  On 18 March of this year her blood pressure had rebounded.  She had no tenderness over the temporal arteries, she has a high TSH, higher than normal fasting glucose, her neurologic  exam was nonfocal at the time.   An MRI of the brain without contrast was ordered to evaluate the tingling and numbness of the right arm, and Dr. [name] also ordered Belsomra 50 mg tablets as a sleep aid. She took those for 12 days and they did not work. She couldn't tolerate trazodone she stated.   09-27-2018,  RV with CD- Mrs. [name] [name] , a 69 year old great grandmother is seen here after she received treatment for PLMD and restless legs, her feet are not longer as tender. She sleeps better-- and feels rested. She takes a tylenol Pm around 7.30- and at 10 she takes  Ambien and is out. Her mind is racing if she doesn't take the medication- but she has learnt to keep a better sleep hygiene.   Sleep habits are as follows: she is aiming for a bedtime of 11 Pm, and for the last hour of the day she is reading in her bible- and praying. No electronics , lights are dim. She describes her bedroom as conducive to sleep, there are no distracting lights, sounds it is cool, quiet and dark.  She sleeps alone in her bed. She does not have a preferred sleep position, she sleeps on only one flat but firm pillow.  Her bed is not adjustable. She takes 2 Tylenol PM with Benadryl, and Ambien 5 mg between 8 and 9 PM and still struggles to go to bed and sleep before midnight. She does not have to go to the bathroom at night, her mother lives with her and has not noted her to snore.  The patient herself is not sure if she snores or not or if she may have apnea or not.  She still suffers from severe hot flashes even at night, and sometimes wakes up with palpitations and drenched in sweat.  She does not recall vivid dreams but she used to have 10 or 20 years ago. She rises at 8 AM to take care of her grand children. Her daughter works at [employer] as a Database administrator and is  in NP school. She picks her children up at 7 or 8 PM.    Sleep medical history and family sleep history:  Menopausal, mind racing. Insomnia.Since age 56 .  Social history:  68 year old african [place] female patient [place].  Quit smoking 25 years ago and quit drinking 25 years ago. Caffeine : she drinks a pepsi in AM, and never past 6 Pm, no iced or hot tea, no coffee, no energy drinks. 30 years ago worked night shifts - and had early, early perimenopause at that time- she "never recovered" , never used HRT. Exercise- walking 6 miles a day 3 days a week.   Divorced - but has a civil relationship with her ex spouse. Mother of 3 sucessfull children, living with her mother. 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    REVIEW OF SYSTEMS: Out of a complete 14 system review of symptoms, the patient complains only of the following symptoms, none and all other reviewed systems are negative.  Epworth Sleepiness Scale: 0  ALLERGIES: Allergies  Allergen Reactions   Fosamax [Alendronate Sodium] Nausea And Vomiting   Penicillins Hives    Did it involve swelling of the face/tongue/throat, SOB, or low BP? No Did it involve sudden or severe rash/hives, skin peeling, or any reaction on the inside of your mouth or nose? Yes Did you need to seek medical attention at  a hospital or doctor's office? Yes When did it last happen?Over 10 years ago If all above answers are NO, may proceed with cephalosporin use.    HOME MEDICATIONS: Outpatient Medications Prior to Visit  Medication Sig Dispense Refill   acetaminophen (TYLENOL) 325 MG tablet Take 650 mg by mouth every 6 (six) hours as needed for mild pain.      amLODipine (NORVASC) 5 MG tablet TAKE 1 TABLET (5 MG TOTAL) BY MOUTH DAILY AFTER SUPPER. 90 tablet 0   atorvastatin (LIPITOR) 40 MG tablet Take 1 tablet (40 mg total) by mouth daily. 90 tablet 3   benazepril-hydrochlorthiazide (LOTENSIN HCT) 10-12.5 MG tablet Take 1 tablet by mouth daily. 90 tablet 3   citalopram (CELEXA) 10 MG tablet Take 1 tablet (10 mg total) by mouth daily. 90 tablet 3   clopidogrel (PLAVIX) 75 MG tablet TAJE 1 TABLET BY MOUTH DAILY. (Patient taking differently: Take 75 mg by mouth daily. ) 90 tablet 1   ezetimibe (ZETIA) 10 MG tablet Take 1 tablet (10 mg total) by mouth daily after supper. 90 tablet 1   levalbuterol (XOPENEX HFA) 45 MCG/ACT inhaler Inhale 2 puffs into the lungs 4 (four) times daily. 1 Inhaler 12   levothyroxine (SYNTHROID) 75 MCG tablet Take 1 tablet (75 mcg total) by mouth daily before breakfast. 90 tablet 2   metoprolol succinate (TOPROL-XL) 25 MG 24 hr tablet Take 1 tablet (25 mg total) by mouth daily. 90 tablet 3   montelukast  (SINGULAIR) 10 MG tablet 1 tab by mouth daily (Patient taking differently: Take 10 mg by mouth daily. ) 90 tablet 3   rOPINIRole (REQUIP) 0.5 MG tablet Take 1 tablet (0.5 mg total) by mouth at bedtime. 90 tablet 1   spironolactone (ALDACTONE) 25 MG tablet TAKE 1 TABLET BY MOUTH EVERY DAY IN THE MORNING 90 tablet 0   traMADol (ULTRAM) 50 MG tablet Take 1 tablet (50 mg total) by mouth every 8 (eight) hours as needed. (Patient taking differently: Take 50 mg by mouth every 8 (eight) hours as needed for moderate pain. ) 90 tablet 5   zolpidem (AMBIEN) 10 MG tablet Take 1 tablet (10 mg total) by mouth at bedtime as needed for up to 30 days for sleep. (Patient taking differently: Take 10 mg by mouth at bedtime. ) 90 tablet 1   No facility-administered medications prior to visit.     PAST MEDICAL HISTORY: Past Medical History:  Diagnosis Date   ALCOHOL ABUSE, HX OF 11/06/2007   ALLERGIC RHINITIS 11/06/2007   Anxiety 04/03/2011   ASTHMA 09/13/2007   Chronic pain syndrome 12/15/2016   COLONIC POLYPS, HX OF 02/09/2008    ADENOMATOUS POLYP   Encounter for well adult exam without abnormal findings 04/03/2011   HYPERLIPIDEMIA 11/03/2010   HYPERTENSION 09/13/2007   HYPOTHYROIDISM 11/06/2007   Impaired glucose tolerance 07/30/2013   INSOMNIA, HX OF 09/13/2007   Lumbar degenerative disc disease 12/15/2016   OSTEOPOROSIS 11/06/2007   PERIMENOPAUSAL STATUS 09/13/2007   RLS (restless legs syndrome)    Stroke [facility])    TIA (transient ischemic attack) 11/04/2011    PAST SURGICAL HISTORY: Past Surgical History:  Procedure Laterality Date   BREAST EXCISIONAL BIOPSY     left   BREAST SURGERY  2008 and 2012   x 2 - benign, left side   carotid artery surgery Left    CESAREAN SECTION     x 3   COLONOSCOPY  multiple   2010    FAMILY HISTORY: Family History  Problem Relation Age of Onset   Hyperlipidemia Other    Hypertension Other    Coronary artery disease Other    Breast  cancer Cousin    Hypertension Mother    Heart disease Mother    Heart failure Mother    Dementia Maternal Grandmother    Colon cancer Neg Hx     SOCIAL HISTORY: Social History   Socioeconomic History   Marital status: Divorced    Spouse name: Not on file   Number of children: 3   Years of education: Not on file   Highest education level: Not on file  Occupational History   Occupation: Consulting civil engineer    Employer: [employer] strain: Not on file   Food insecurity    Worry: Not on file    Inability: Not on file   Transportation needs    Medical: Not on file    Non-medical: Not on file  Tobacco Use   Smoking status: Former Smoker    Packs/day: 0.25    Types: Cigarettes    Quit date: 05/31/1979    Years since quitting: 40.3   Smokeless tobacco: Never Used   Tobacco comment: social smoker  Substance and Sexual Activity   Alcohol use: No   Drug use: No   Sexual activity: Not on file  Lifestyle   Physical activity    Days per week: Not on file    Minutes per session: Not on file   Stress: Not on file  Relationships   Social connections    Talks on phone: Not on file    Gets together: Not on file    Attends religious service: Not on file    Active member of club or organization: Not on file    Attends meetings of clubs or organizations: Not on file    Relationship status: Not on file   Intimate partner violence    Fear of current or ex partner: Not on file    Emotionally abused: Not on file    Physically abused: Not on file    Forced sexual activity: Not on file  Other Topics Concern   Not on file  Social History Narrative   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  Generalized: Well developed, in no acute distress  Cardiology: normal rate and rhythm, no murmur noted Respiratory: clear to auscultation bilaterally Neurological examination  Mentation: Alert oriented to time, place, history taking. Follows all commands speech and language fluent Cranial nerve II-XII: Pupils were equal round reactive to light. Extraocular movements were full, visual field were full on confrontational test. Motor: The motor testing reveals 5 over 5 strength of all 4 extremities. Good symmetric motor tone is noted throughout.  Gait and station: Gait is normal.   DIAGNOSTIC DATA (LABS, IMAGING, TESTING) - I reviewed patient records, labs, notes, testing and imaging myself where available.  No flowsheet data found.   Lab Results  Component Value Date   WBC 6.3 05/09/2019   HGB 12.9 05/09/2019   HCT 39.1 05/09/2019  MCV 93.5 05/09/2019   PLT 216 05/09/2019      Component Value Date/Time   NA 139 06/09/2019 1204   K 4.1 06/09/2019 1204   CL 100 06/09/2019 1204   CO2 23 06/09/2019 1204   GLUCOSE 99 06/09/2019 1204   GLUCOSE 101 (H) 05/10/2019 0517   GLUCOSE 101 (H) 10/29/2006 1025   BUN 12 06/09/2019 1204   CREATININE 1.00 06/09/2019 1204   CALCIUM 9.6 06/09/2019 1204   PROT 6.9 05/01/2019 1312   ALBUMIN 4.1 05/01/2019 1312   AST 12 05/01/2019 1312   ALT 10 05/01/2019 1312   ALKPHOS 101 05/01/2019 1312   BILITOT 0.6 05/01/2019 1312   GFRNONAA 58 (L) 06/09/2019 1204   GFRAA 67 06/09/2019 1204   Lab Results  Component Value Date   CHOL 186 05/01/2019   HDL 73.80 05/01/2019   LDLCALC 98 05/01/2019   LDLDIRECT 127.1 10/24/2010   TRIG 74.0 05/01/2019   CHOLHDL 3 05/01/2019   Lab Results  Component Value Date   HGBA1C 6.2 05/01/2019   No results found for: VITAMINB12 Lab Results  Component Value Date   TSH 1.32 05/01/2019       ASSESSMENT AND PLAN 68 y.o.  year old female  has a past medical history of ALCOHOL ABUSE, HX OF (11/06/2007), ALLERGIC RHINITIS (11/06/2007), Anxiety (04/03/2011), ASTHMA (09/13/2007), Chronic pain syndrome (12/15/2016), COLONIC POLYPS, HX OF (02/09/2008), Encounter for well adult exam without abnormal findings (04/03/2011), HYPERLIPIDEMIA (11/03/2010), HYPERTENSION (09/13/2007), HYPOTHYROIDISM (11/06/2007), Impaired glucose tolerance (07/30/2013), INSOMNIA, HX OF (09/13/2007), Lumbar degenerative disc disease (12/15/2016), OSTEOPOROSIS (11/06/2007), PERIMENOPAUSAL STATUS (09/13/2007), RLS (restless legs syndrome), Stroke ([place]), and TIA (transient ischemic attack) (11/04/2011). here with     ICD-10-CM   1. PLMD (periodic limb movement disorder)  G47.61   2. RLS (restless legs syndrome)  G25.81   3. Insomnia disorder related to known organic factor  G47.00     Ms. Kassim is doing very well on Requip 0.5mg , Ambien and Tylenol at bedtime.  We will continue Requip as prescribed.  She will continue to follow closely with primary care provider for refills of Ambien as well as anxiety management.  She was advised that she may request additional refills of Requip from primary care if they are willing.  If not she should follow-up annually with [place].  She verbalizes understanding and agreement with this plan.   No orders of the defined types were placed in this encounter.    No orders of the defined types were placed in this encounter.     I spent 15 minutes with the patient. 50% of this time was spent counseling and educating patient on plan of care and medications.    Debbora Presto, FNP-C 10/02/2019, 3:12 PM [facility] [address], [place], [place] [address] [phone]

## 2019-10-02 ENCOUNTER — Ambulatory Visit: Payer: Medicare Other | Admitting: Adult Health

## 2019-10-02 ENCOUNTER — Other Ambulatory Visit: Payer: Self-pay | Admitting: Cardiology

## 2019-10-02 ENCOUNTER — Other Ambulatory Visit: Payer: Self-pay

## 2019-10-02 ENCOUNTER — Telehealth: Payer: Self-pay | Admitting: Internal Medicine

## 2019-10-02 ENCOUNTER — Encounter: Payer: Self-pay | Admitting: Family Medicine

## 2019-10-02 ENCOUNTER — Ambulatory Visit: Payer: Medicare Other | Admitting: Family Medicine

## 2019-10-02 VITALS — BP 101/66 | HR 76 | Temp 97.0°F | Ht 59.0 in | Wt 145.2 lb

## 2019-10-02 DIAGNOSIS — G4761 Periodic limb movement disorder: Secondary | ICD-10-CM

## 2019-10-02 DIAGNOSIS — G4700 Insomnia, unspecified: Secondary | ICD-10-CM

## 2019-10-02 DIAGNOSIS — I10 Essential (primary) hypertension: Secondary | ICD-10-CM

## 2019-10-02 DIAGNOSIS — G2581 Restless legs syndrome: Secondary | ICD-10-CM | POA: Diagnosis not present

## 2019-10-02 MED ORDER — ZOLPIDEM TARTRATE 10 MG PO TABS: 10.0000 mg | ORAL_TABLET | Freq: Every evening | ORAL | 1 refills | Status: DC | PRN

## 2019-10-02 MED ORDER — ROPINIROLE HCL 0.5 MG PO TABS: 0.5000 mg | ORAL_TABLET | Freq: Every day | ORAL | 3 refills | Status: DC

## 2019-10-02 NOTE — Telephone Encounter (Signed)
Done erx 

## 2019-10-02 NOTE — Patient Instructions (Addendum)
We will continue Requip daily as prescribed.   May return to PCP for additional refills, follow up here once yearly if PCP unable to refill.    Restless Legs Syndrome Restless legs syndrome is a condition that causes uncomfortable feelings or sensations in the legs, especially while sitting or lying down. The sensations usually cause an overwhelming urge to move the legs. The arms can also sometimes be affected. The condition can range from mild to severe. The symptoms often interfere with a person's ability to sleep. What are the causes? The cause of this condition is not known. What increases the risk? The following factors may make you more likely to develop this condition:  Being older than 50.  Pregnancy.  Being a woman. In general, the condition is more common in women than in men.  A family history of the condition.  Having iron deficiency.  Overuse of caffeine, nicotine, or alcohol.  Certain medical conditions, such as kidney disease, Parkinson's disease, or nerve damage.  Certain medicines, such as those for high blood pressure, nausea, colds, allergies, depression, and some heart conditions. What are the signs or symptoms? The main symptom of this condition is uncomfortable sensations in the legs, such as:  Pulling.  Tingling.  Prickling.  Throbbing.  Crawling.  Burning. Usually, the sensations:  Affect both sides of the body.  Are worse when you sit or lie down.  Are worse at night. These may wake you up or make it difficult to fall asleep.  Make you have a strong urge to move your legs.  Are temporarily relieved by moving your legs. The arms can also be affected, but this is rare. People who have this condition often have tiredness during the day because of their lack of sleep at night. How is this diagnosed? This condition may be diagnosed based on:  Your symptoms.  Blood tests. In some cases, you may be monitored in a sleep lab by a  specialist (a sleep study). This can detect any disruptions in your sleep. How is this treated? This condition is treated by managing the symptoms. This may include:  Lifestyle changes, such as exercising, using relaxation techniques, and avoiding caffeine, alcohol, or tobacco.  Medicines. Anti-seizure medicines may be tried first. Follow these instructions at home:     General instructions  Take over-the-counter and prescription medicines only as told by your health care provider.  Use methods to help relieve the uncomfortable sensations, such as: ? Massaging your legs. ? Walking or stretching. ? Taking a cold or hot bath.  Keep all follow-up visits as told by your health care provider. This is important. Lifestyle  Practice good sleep habits. For example, go to bed and get up at the same time every day. Most adults should get 7-9 hours of sleep each night.  Exercise regularly. Try to get at least 30 minutes of exercise most days of the week.  Practice ways of relaxing, such as yoga or meditation.  Avoid caffeine and alcohol.  Do not use any products that contain nicotine or tobacco, such as cigarettes and e-cigarettes. If you need help quitting, ask your health care provider. Contact a health care provider if:  Your symptoms get worse or they do not improve with treatment. Summary  Restless legs syndrome is a condition that causes uncomfortable feelings or sensations in the legs, especially while sitting or lying down.  The symptoms often interfere with a person's ability to sleep.  This condition is treated by managing the  symptoms. You may need to make lifestyle changes or take medicines. This information is not intended to replace advice given to you by your health care provider. Make sure you discuss any questions you have with your health care provider. Document Released: 11/20/2002 Document Revised: 12/20/2017 Document Reviewed: 12/20/2017 Elsevier Patient  Education  2020 [employer].

## 2019-10-02 NOTE — Telephone Encounter (Signed)
Medication Refill - Medication: zolpidem (AMBIEN) 10 MG tablet(Expired)  Has the patient contacted their pharmacy? No - states that this may require prior auth but she will be out on Friday (Agent: If no, request that the patient contact the pharmacy for the refill.) (Agent: If yes, when and what did the pharmacy advise?)  Preferred Pharmacy (with phone number or street name):  [id number] - [address], [place] [id number] (Phone) [phone] (Fax)   Agent: Please be advised that RX refills may take up to 3 business days. We ask that you follow-up with your pharmacy.

## 2019-10-16 ENCOUNTER — Other Ambulatory Visit: Payer: Medicare Other

## 2019-10-16 ENCOUNTER — Ambulatory Visit (INDEPENDENT_AMBULATORY_CARE_PROVIDER_SITE_OTHER): Payer: Medicare Other | Admitting: Family

## 2019-10-16 ENCOUNTER — Other Ambulatory Visit (INDEPENDENT_AMBULATORY_CARE_PROVIDER_SITE_OTHER): Payer: Medicare Other

## 2019-10-16 ENCOUNTER — Encounter: Payer: Self-pay | Admitting: Family

## 2019-10-16 ENCOUNTER — Other Ambulatory Visit: Payer: Self-pay

## 2019-10-16 VITALS — BP 114/72 | HR 69 | Temp 98.3°F | Ht 59.0 in | Wt 145.8 lb

## 2019-10-16 DIAGNOSIS — E785 Hyperlipidemia, unspecified: Secondary | ICD-10-CM

## 2019-10-16 DIAGNOSIS — R7302 Impaired glucose tolerance (oral): Secondary | ICD-10-CM | POA: Diagnosis not present

## 2019-10-16 DIAGNOSIS — G2581 Restless legs syndrome: Secondary | ICD-10-CM

## 2019-10-16 DIAGNOSIS — M8580 Other specified disorders of bone density and structure, unspecified site: Secondary | ICD-10-CM

## 2019-10-16 DIAGNOSIS — R635 Abnormal weight gain: Secondary | ICD-10-CM

## 2019-10-16 DIAGNOSIS — R200 Anesthesia of skin: Secondary | ICD-10-CM | POA: Diagnosis not present

## 2019-10-16 DIAGNOSIS — E039 Hypothyroidism, unspecified: Secondary | ICD-10-CM | POA: Diagnosis not present

## 2019-10-16 DIAGNOSIS — G4701 Insomnia due to medical condition: Secondary | ICD-10-CM

## 2019-10-16 DIAGNOSIS — F329 Major depressive disorder, single episode, unspecified: Secondary | ICD-10-CM

## 2019-10-16 DIAGNOSIS — R202 Paresthesia of skin: Secondary | ICD-10-CM

## 2019-10-16 DIAGNOSIS — I10 Essential (primary) hypertension: Secondary | ICD-10-CM

## 2019-10-16 DIAGNOSIS — Z23 Encounter for immunization: Secondary | ICD-10-CM

## 2019-10-16 DIAGNOSIS — F1021 Alcohol dependence, in remission: Secondary | ICD-10-CM

## 2019-10-16 DIAGNOSIS — J45909 Unspecified asthma, uncomplicated: Secondary | ICD-10-CM

## 2019-10-16 DIAGNOSIS — F419 Anxiety disorder, unspecified: Secondary | ICD-10-CM

## 2019-10-16 DIAGNOSIS — F32A Depression, unspecified: Secondary | ICD-10-CM

## 2019-10-16 LAB — COMPREHENSIVE METABOLIC PANEL
ALT: 16 U/L (ref 0–35)
AST: 16 U/L (ref 0–37)
Albumin: 4.1 g/dL (ref 3.5–5.2)
Alkaline Phosphatase: 93 U/L (ref 39–117)
BUN: 22 mg/dL (ref 6–23)
CO2: 27 mEq/L (ref 19–32)
Calcium: 9.3 mg/dL (ref 8.4–10.5)
Chloride: 102 mEq/L (ref 96–112)
Creatinine, Ser: 1.03 mg/dL (ref 0.40–1.20)
GFR: 64.58 mL/min (ref >60.00)
Glucose, Bld: 110 mg/dL — ABNORMAL HIGH (ref 70–99)
Potassium: 4.2 mEq/L (ref 3.5–5.1)
Sodium: 136 mEq/L (ref 135–145)
Total Bilirubin: 0.4 mg/dL (ref 0.2–1.2)
Total Protein: 6.8 g/dL (ref 6.0–8.3)

## 2019-10-16 LAB — TSH: TSH: 0.95 u[IU]/mL (ref 0.35–4.50)

## 2019-10-16 LAB — VITAMIN B12: Vitamin B-12: 659 pg/mL (ref 211–911)

## 2019-10-16 LAB — HEMOGLOBIN A1C: Hgb A1c MFr Bld: 6.4 % (ref 4.6–6.5)

## 2019-10-16 LAB — VITAMIN D 25 HYDROXY (VIT D DEFICIENCY, FRACTURES): VITD: 38.39 ng/mL (ref 30.00–100.00)

## 2019-10-16 LAB — MAGNESIUM: Magnesium: 2.2 mg/dL (ref 1.5–2.5)

## 2019-10-16 MED ORDER — CITALOPRAM HYDROBROMIDE 20 MG PO TABS: 20.0000 mg | ORAL_TABLET | Freq: Every day | ORAL | 3 refills | Status: DC

## 2019-10-16 MED ORDER — BREO ELLIPTA 100-25 MCG/INH IN AEPB: 1.0000 | INHALATION_SPRAY | Freq: Every day | RESPIRATORY_TRACT | 2 refills | Status: DC

## 2019-10-16 NOTE — Progress Notes (Signed)
[name] [name] is a 68 y.o. female with the following history as recorded in [employer]:  Patient Active Problem List   Diagnosis Date Noted  . Sacroiliac pain 08/10/2019  . Anxiety and depression 05/11/2019  . Chest pain 05/09/2019  . Arthritis of sacroiliac joint of both sides 05/01/2019  . Polyarthralgia 12/28/2018  . Preventative health care 10/20/2018  . RLS (restless legs syndrome) 09/27/2018  . Insomnia disorder related to known organic factor 06/27/2018  . PLMD (periodic limb movement disorder) 06/27/2018  . Hot flashes due to menopause 04/25/2018  . Palpitations 04/25/2018  . Diaphoresis 04/25/2018  . Long term current use of antithrombotics/antiplatelets 04/19/2018  . Insomnia 04/19/2018  . Intractable episodic headache 02/28/2018  . Numbness and tingling of right arm 02/28/2018  . Toe pain, right 10/19/2017  . Rotator cuff arthropathy of left shoulder 06/02/2017  . Trigger thumb of left hand 06/02/2017  . Lipoma 05/06/2017  . Pain of left thumb 05/06/2017  . Chronic pain syndrome 12/15/2016  . Lumbar degenerative disc disease 12/15/2016  . Left leg pain 01/28/2016  . Left shoulder pain 04/09/2015  . Impaired glucose tolerance 07/30/2013  . Abnormal breath sounds 07/30/2013  . Left carotid stenosis 05/05/2012  . Anxiety 04/03/2011  . CVA (cerebral infarction) 03/15/2011  . HLD (hyperlipidemia) 11/03/2010  . Hx of adenomatous colonic polyps 02/09/2008  . Hypothyroidism 11/06/2007  . ALLERGIC RHINITIS 11/06/2007  . OSTEOPOROSIS 11/06/2007  . ALCOHOL ABUSE, HX OF 11/06/2007  . Essential hypertension 09/13/2007  . Asthma 09/13/2007  . PERIMENOPAUSAL STATUS 09/13/2007  . Chronic insomnia 09/13/2007    Current Outpatient Medications  Medication Sig Dispense Refill  . acetaminophen (TYLENOL) 325 MG tablet Take 650 mg by mouth every 6 (six) hours as needed for mild pain.     Marland Kitchen amLODipine (NORVASC) 5 MG tablet TAKE 1 TABLET (5 MG TOTAL) BY MOUTH DAILY AFTER SUPPER. 90  tablet 0  . atorvastatin (LIPITOR) 40 MG tablet Take 1 tablet (40 mg total) by mouth daily. 90 tablet 3  . benazepril-hydrochlorthiazide (LOTENSIN HCT) 10-12.5 MG tablet Take 1 tablet by mouth daily. 90 tablet 3  . citalopram (CELEXA) 20 MG tablet Take 1 tablet (20 mg total) by mouth daily. 90 tablet 3  . clopidogrel (PLAVIX) 75 MG tablet TAJE 1 TABLET BY MOUTH DAILY. (Patient taking differently: Take 75 mg by mouth daily. ) 90 tablet 1  . ezetimibe (ZETIA) 10 MG tablet Take 1 tablet (10 mg total) by mouth daily after supper. 90 tablet 1  . levalbuterol (XOPENEX HFA) 45 MCG/ACT inhaler Inhale 2 puffs into the lungs 4 (four) times daily. 1 Inhaler 12  . levothyroxine (SYNTHROID) 75 MCG tablet Take 1 tablet (75 mcg total) by mouth daily before breakfast. 90 tablet 2  . metoprolol succinate (TOPROL-XL) 25 MG 24 hr tablet Take 1 tablet (25 mg total) by mouth daily. 90 tablet 3  . montelukast (SINGULAIR) 10 MG tablet 1 tab by mouth daily (Patient taking differently: Take 10 mg by mouth daily. ) 90 tablet 3  . rOPINIRole (REQUIP) 0.5 MG tablet Take 1 tablet (0.5 mg total) by mouth at bedtime. 90 tablet 3  . spironolactone (ALDACTONE) 25 MG tablet TAKE 1 TABLET BY MOUTH EVERY DAY IN THE MORNING 90 tablet 0  . zolpidem (AMBIEN) 10 MG tablet Take 1 tablet (10 mg total) by mouth at bedtime as needed for sleep. 90 tablet 1  . fluticasone furoate-vilanterol (BREO ELLIPTA) 100-25 MCG/INH AEPB Inhale 1 puff into the lungs daily. 28 each  2  . traMADol (ULTRAM) 50 MG tablet Take 1 tablet (50 mg total) by mouth every 8 (eight) hours as needed. (Patient not taking: Reported on 10/16/2019) 90 tablet 5   No current facility-administered medications for this visit.     Allergies: Fosamax [alendronate sodium] and Penicillins  Past Medical History:  Diagnosis Date  . ALCOHOL ABUSE, HX OF 11/06/2007  . ALLERGIC RHINITIS 11/06/2007  . Anxiety 04/03/2011  . ASTHMA 09/13/2007  . Chronic pain syndrome 12/15/2016  . COLONIC  POLYPS, HX OF 02/09/2008    ADENOMATOUS POLYP  . Encounter for well adult exam without abnormal findings 04/03/2011  . HYPERLIPIDEMIA 11/03/2010  . HYPERTENSION 09/13/2007  . HYPOTHYROIDISM 11/06/2007  . Impaired glucose tolerance 07/30/2013  . INSOMNIA, HX OF 09/13/2007  . Lumbar degenerative disc disease 12/15/2016  . OSTEOPOROSIS 11/06/2007  . PERIMENOPAUSAL STATUS 09/13/2007  . RLS (restless legs syndrome)   . Stroke ([place])   . TIA (transient ischemic attack) 11/04/2011    Past Surgical History:  Procedure Laterality Date  . BREAST EXCISIONAL BIOPSY     left  . BREAST SURGERY  2008 and 2012   x 2 - benign, left side  . carotid artery surgery Left   . CESAREAN SECTION     x 3  . COLONOSCOPY  multiple   2010    Family History  Problem Relation Age of Onset  . Hyperlipidemia Other   . Hypertension Other   . Coronary artery disease Other   . Breast cancer Cousin   . Hypertension Mother   . Heart disease Mother   . Heart failure Mother   . Dementia Maternal Grandmother   . Colon cancer Neg Hx     Social History   Tobacco Use  . Smoking status: Former Smoker    Packs/day: 0.25    Types: Cigarettes    Quit date: 05/31/1979    Years since quitting: 40.4  . Smokeless tobacco: Never Used  . Tobacco comment: social smoker  Substance Use Topics  . Alcohol use: No    Subjective:  Patient presents for 6 month follow-up on chronic care needs; Would like to get her flu shot updated;   1) History of hypertension/ hyperlipidemia- working with cardiologist ( Dr. Einar Gip); very pleased with her response to current regimen and how well her blood pressure is doing;  2) History of anxiety/ depression- only taking Citalopram 10 mg;  3) History of allergies/ asthma- does not have to use her albuterol daily but has been using more frequently; on Singulair daily; may need to consider another asthma medication in the spring when the weather changes;  Notes that she has had a dry cough x 6  months however; normal CXR done in May 2020; 4) Chronic insomnia- has tried everything and only does well on Ambien; 5) Weight gain- has gained 10 pounds in the past 6 months;  6) Numbness/ tingling in her lower extremities;      Objective:  Vitals:   10/16/19 1119  BP: 114/72  Pulse: 69  Temp: 98.3 F (36.8 C)  TempSrc: Oral  SpO2: 98%  Weight: 145 lb 12.8 oz (66.1 kg)  Height: 4' 11"  (1.499 m)    General: Well developed, well nourished, in no acute distress  Skin : Warm and dry.  Head: Normocephalic and atraumatic  Lungs: Respirations unlabored; clear to auscultation bilaterally without wheeze, rales, rhonchi  CVS exam: normal rate and regular rhythm.  Neurologic: Alert and oriented; speech intact; face symmetrical; moves all  extremities well; CNII-XII intact without focal deficit   Assessment:  1. Osteopenia, unspecified location   2. Numbness and tingling   3. Weight gain   4. Acquired hypothyroidism   5. Impaired glucose tolerance   6. Personal history of alcoholism ([place]) Chronic  7. Essential hypertension   8. Anxiety and depression   9. Insomnia due to medical condition   10. Hyperlipidemia, unspecified hyperlipidemia type   11. RLS (restless legs syndrome)   12. Extrinsic asthma without complication, unspecified asthma severity, unspecified whether persistent     Plan:  1. Update DEXA- last done in 2018; 2. Check potassium, B12, magnesium today; 3. & 4. Will make sure TSH is correct; health diet and exercise encouraged; follow-up to be determined. 5. Check hgba1c; 7. Stable; continue with cardiology; 8. Increase Citalopram to 20 mg daily; 9. Stable; continue Ambien 10 mg; per patient and in reviewing old notes, this is the only medication that has been effective. 10. Encouraged to get labs done as requested by her cardiologist; 11. Stable; continue with neurology; 12. Trial of BREO 100 mg- use as directed; follow-up in one month; may need updated CXR and/or  changing blood pressure medication.   Flu shot given;   Return in about 1 month (around 11/15/2019).  Orders Placed This Encounter  Procedures  . DG Bone Density    Standing Status:   Future    Standing Expiration Date:   12/15/2020    Order Specific Question:   Reason for Exam (SYMPTOM  OR DIAGNOSIS REQUIRED)    Answer:   osteopenia    Order Specific Question:   Preferred imaging location?    Answer:   [facility]  . Comp Met (CMET)    Standing Status:   Future    Number of Occurrences:   1    Standing Expiration Date:   10/15/2020  . Magnesium    Standing Status:   Future    Number of Occurrences:   1    Standing Expiration Date:   10/15/2020  . B12    Standing Status:   Future    Number of Occurrences:   1    Standing Expiration Date:   10/15/2020  . Vitamin D (25 hydroxy)    Standing Status:   Future    Number of Occurrences:   1    Standing Expiration Date:   10/15/2020  . TSH    Standing Status:   Future    Number of Occurrences:   1    Standing Expiration Date:   10/15/2020  . HgB A1c    Standing Status:   Future    Number of Occurrences:   1    Standing Expiration Date:   10/15/2020    Requested Prescriptions   Signed Prescriptions Disp Refills  . citalopram (CELEXA) 20 MG tablet 90 tablet 3    Sig: Take 1 tablet (20 mg total) by mouth daily.  . fluticasone furoate-vilanterol (BREO ELLIPTA) 100-25 MCG/INH AEPB 28 each 2    Sig: Inhale 1 puff into the lungs daily.

## 2019-10-16 NOTE — Addendum Note (Signed)
Addended by: Marcina Millard on: 10/16/2019 01:20 PM   Modules accepted: Orders

## 2019-10-17 ENCOUNTER — Inpatient Hospital Stay: Admission: RE | Admit: 2019-10-17 | Payer: Medicare Other | Source: Ambulatory Visit

## 2019-10-18 LAB — LIPID PANEL WITH LDL/HDL RATIO
Cholesterol, Total: 144 mg/dL (ref 100–199)
HDL: 64 mg/dL (ref >39)
LDL Chol Calc (NIH): 69 mg/dL (ref 0–99)
LDL/HDL Ratio: 1.1 ratio (ref 0.0–3.2)
Triglycerides: 48 mg/dL (ref 0–149)
VLDL Cholesterol Cal: 11 mg/dL (ref 5–40)

## 2019-10-18 LAB — CMP14+EGFR
ALT: 14 IU/L (ref 0–32)
AST: 16 IU/L (ref 0–40)
Albumin/Globulin Ratio: 2 (ref 1.2–2.2)
Albumin: 4.2 g/dL (ref 3.8–4.8)
Alkaline Phosphatase: 105 IU/L (ref 39–117)
BUN/Creatinine Ratio: 13 (ref 12–28)
BUN: 13 mg/dL (ref 8–27)
Bilirubin Total: 0.5 mg/dL (ref 0.0–1.2)
CO2: 26 mmol/L (ref 20–29)
Calcium: 9.5 mg/dL (ref 8.7–10.3)
Chloride: 100 mmol/L (ref 96–106)
Creatinine, Ser: 0.99 mg/dL (ref 0.57–1.00)
GFR calc Af Amer: 68 mL/min/{1.73_m2} (ref >59)
GFR calc non Af Amer: 59 mL/min/{1.73_m2} — ABNORMAL LOW (ref >59)
Globulin, Total: 2.1 g/dL (ref 1.5–4.5)
Glucose: 104 mg/dL — ABNORMAL HIGH (ref 65–99)
Potassium: 4.6 mmol/L (ref 3.5–5.2)
Sodium: 136 mmol/L (ref 134–144)
Total Protein: 6.3 g/dL (ref 6.0–8.5)

## 2019-10-18 LAB — LDL CHOLESTEROL, DIRECT: LDL Direct: 72 mg/dL (ref 0–99)

## 2019-10-25 ENCOUNTER — Other Ambulatory Visit: Payer: Medicare Other

## 2019-10-25 ENCOUNTER — Ambulatory Visit: Payer: Medicare Other | Admitting: Family

## 2019-10-25 ENCOUNTER — Ambulatory Visit: Payer: Medicare Other | Admitting: Internal Medicine

## 2019-10-30 ENCOUNTER — Ambulatory Visit (INDEPENDENT_AMBULATORY_CARE_PROVIDER_SITE_OTHER)
Admission: RE | Admit: 2019-10-30 | Discharge: 2019-10-30 | Disposition: A | Discharge status: Home / Self Care | Payer: Medicare Other | Source: Ambulatory Visit | Attending: Family | Admitting: Family

## 2019-10-30 ENCOUNTER — Other Ambulatory Visit: Payer: Self-pay

## 2019-10-30 DIAGNOSIS — M8580 Other specified disorders of bone density and structure, unspecified site: Secondary | ICD-10-CM

## 2019-11-05 ENCOUNTER — Other Ambulatory Visit: Payer: Self-pay | Admitting: Internal Medicine

## 2019-11-12 NOTE — Progress Notes (Signed)
[name] [address] [place], [place] [address] Phone: [phone] Subjective:   I [name] [name] am serving as a Education administrator for Dr. Hulan Saas.   CC: Low back pain follow-up  [id number]  [name] [name] is a 68 y.o. female coming in with complaint of low back pain. Patient states she is in pain and believes the weather has something to do with it.  Degenerative disc disease as well as underlying fibromyalgia.    Seen 3 months ago and had bilateral sacroiliac injections.  Past Medical History:  Diagnosis Date  . ALCOHOL ABUSE, HX OF 11/06/2007  . ALLERGIC RHINITIS 11/06/2007  . Anxiety 04/03/2011  . ASTHMA 09/13/2007  . Chronic pain syndrome 12/15/2016  . COLONIC POLYPS, HX OF 02/09/2008    ADENOMATOUS POLYP  . Encounter for well adult exam without abnormal findings 04/03/2011  . HYPERLIPIDEMIA 11/03/2010  . HYPERTENSION 09/13/2007  . HYPOTHYROIDISM 11/06/2007  . Impaired glucose tolerance 07/30/2013  . INSOMNIA, HX OF 09/13/2007  . Lumbar degenerative disc disease 12/15/2016  . OSTEOPOROSIS 11/06/2007  . PERIMENOPAUSAL STATUS 09/13/2007  . RLS (restless legs syndrome)   . Stroke ([place])   . TIA (transient ischemic attack) 11/04/2011   Past Surgical History:  Procedure Laterality Date  . BREAST EXCISIONAL BIOPSY     left  . BREAST SURGERY  2008 and 2012   x 2 - benign, left side  . carotid artery surgery Left   . CESAREAN SECTION     x 3  . COLONOSCOPY  multiple   2010   Social History   Socioeconomic History  . Marital status: Divorced    Spouse name: Not on file  . Number of children: 3  . Years of education: Not on file  . Highest education level: Not on file  Occupational History  . Occupation: Nurse, mental health: [employer]  Social Needs  . Financial resource strain: Not on file  . Food insecurity    Worry: Not on file    Inability: Not on file  . Transportation needs    Medical: Not on file   Non-medical: Not on file  Tobacco Use  . Smoking status: Former Smoker    Packs/day: 0.25    Types: Cigarettes    Quit date: 05/31/1979    Years since quitting: 40.4  . Smokeless tobacco: Never Used  . Tobacco comment: social smoker  Substance and Sexual Activity  . Alcohol use: No  . Drug use: No  . Sexual activity: Not on file  Lifestyle  . Physical activity    Days per week: Not on file    Minutes per session: Not on file  . Stress: Not on file  Relationships  . Social Herbalist on phone: Not on file    Gets together: Not on file    Attends religious service: Not on file    Active member of club or organization: Not on file    Attends meetings of clubs or organizations: Not on file    Relationship status: Not on file  Other Topics Concern  . 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 Fosamax [Alendronate Sodium] Nausea And Vomiting  . Penicillins Hives    Did it involve  swelling of the face/tongue/throat, SOB, or low BP? No Did it involve sudden or severe rash/hives, skin peeling, or any reaction on the inside of your mouth or nose? Yes Did you need to seek medical attention at a hospital or doctor's office? Yes When did it last happen?Over 10 years ago If all above answers are "NO", may proceed with cephalosporin use.   Family History  Problem Relation Age of Onset  . Hyperlipidemia Other   . Hypertension Other   . Coronary artery disease Other   . Breast cancer Cousin   . Hypertension Mother   . Heart disease Mother   . Heart failure Mother   . Dementia Maternal Grandmother   . Colon cancer Neg Hx     Current Outpatient Medications (Endocrine & Metabolic):  .  levothyroxine (SYNTHROID) 75 MCG tablet, Take 1 tablet (75 mcg total) by mouth daily before breakfast.  Current  Outpatient Medications (Cardiovascular):  .  amLODipine (NORVASC) 5 MG tablet, TAKE 1 TABLET (5 MG TOTAL) BY MOUTH DAILY AFTER SUPPER. Marland Kitchen  atorvastatin (LIPITOR) 40 MG tablet, Take 1 tablet (40 mg total) by mouth daily. .  benazepril-hydrochlorthiazide (LOTENSIN HCT) 10-12.5 MG tablet, Take 1 tablet by mouth daily. Marland Kitchen  ezetimibe (ZETIA) 10 MG tablet, Take 1 tablet (10 mg total) by mouth daily after supper. .  metoprolol succinate (TOPROL-XL) 25 MG 24 hr tablet, Take 1 tablet (25 mg total) by mouth daily. Marland Kitchen  spironolactone (ALDACTONE) 25 MG tablet, TAKE 1 TABLET BY MOUTH EVERY DAY IN THE MORNING  Current Outpatient Medications (Respiratory):  .  fluticasone furoate-vilanterol (BREO ELLIPTA) 100-25 MCG/INH AEPB, Inhale 1 puff into the lungs daily. Marland Kitchen  levalbuterol (XOPENEX HFA) 45 MCG/ACT inhaler, Inhale 2 puffs into the lungs 4 (four) times daily. .  montelukast (SINGULAIR) 10 MG tablet, TAKE 1 TABLET BY MOUTH EVERY DAY  Current Outpatient Medications (Analgesics):  .  acetaminophen (TYLENOL) 325 MG tablet, Take 650 mg by mouth every 6 (six) hours as needed for mild pain.  .  traMADol (ULTRAM) 50 MG tablet, Take 1 tablet (50 mg total) by mouth every 8 (eight) hours as needed.  Current Outpatient Medications (Hematological):  .  clopidogrel (PLAVIX) 75 MG tablet, TAJE 1 TABLET BY MOUTH DAILY. (Patient taking differently: Take 75 mg by mouth daily. )  Current Outpatient Medications (Other):  .  citalopram (CELEXA) 20 MG tablet, Take 1 tablet (20 mg total) by mouth daily. Marland Kitchen  rOPINIRole (REQUIP) 0.5 MG tablet, Take 1 tablet (0.5 mg total) by mouth at bedtime. Marland Kitchen  zolpidem (AMBIEN) 10 MG tablet, Take 1 tablet (10 mg total) by mouth at bedtime as needed for sleep.    Past medical history, social, surgical and family history all reviewed in electronic medical record.  No pertanent information unless stated regarding to the chief complaint.   Review of Systems:  No headache, visual changes, nausea,  vomiting, diarrhea, constipation, dizziness, abdominal pain, skin rash, fevers, chills, night sweats, weight loss, swollen lymph nodes,  chest pain, shortness of breath, mood changes.  Positive muscle aches and body aches  Objective  Blood pressure 100/70, pulse 67, height 4\' 11"  (1.499 m), weight 149 lb (67.6 kg), SpO2 92 %.    General: No apparent distress alert and oriented x3 mood and affect normal, dressed appropriately.  HEENT: Pupils equal, extraocular movements intact  Respiratory: Patient's speak in full sentences and does not appear short of breath  Cardiovascular: No lower extremity edema, non tender, no erythema  Skin: Warm dry  intact with no signs of infection or rash on extremities or on axial skeleton.  Abdomen: Soft nontender  Neuro: Cranial nerves II through XII are intact, neurovascularly intact in all extremities with 2+ DTRs and 2+ pulses.  Lymph: No lymphadenopathy of posterior or anterior cervical chain or axillae bilaterally.  Gait normal with good balance and coordination.  MSK:  tender with limited range of motion and good stability and symmetric strength and tone of shoulders, elbows, wrist, hip, knee and ankles bilaterally.  Moderate arthritic changes of multiple joints  Back exam has some loss of lordosis.  Patient is tender to palpation multiple soft tissue areas as well as joints noted.  Patient's pain is out of proportion to the amount of palpation.  Patient does have tightness with straight leg test but negative radicular symptoms.  Patient has moderate to severe tenderness over the sacroiliac joint bilaterally.  Neurovascularly intact distally.   Impression and Recommendations:     This case required medical decision making of moderate complexity. The above documentation has been reviewed and is accurate and complete Lyndal Pulley, DO       Note: This dictation was prepared with Dragon dictation along with smaller phrase technology. Any transcriptional  errors that result from this process are unintentional.

## 2019-11-13 ENCOUNTER — Ambulatory Visit (INDEPENDENT_AMBULATORY_CARE_PROVIDER_SITE_OTHER): Payer: Medicare Other | Admitting: Family Medicine

## 2019-11-13 ENCOUNTER — Encounter: Payer: Self-pay | Admitting: Family Medicine

## 2019-11-13 DIAGNOSIS — M47818 Spondylosis without myelopathy or radiculopathy, sacral and sacrococcygeal region: Secondary | ICD-10-CM | POA: Diagnosis not present

## 2019-11-13 MED ORDER — METHYLPREDNISOLONE ACETATE 80 MG/ML IJ SUSP
80.0000 mg | Freq: Once | INTRAMUSCULAR | Status: AC
  Administered 2019-11-13: 13:00:00 80 mg via INTRAMUSCULAR

## 2019-11-13 MED ORDER — KETOROLAC TROMETHAMINE 60 MG/2ML IM SOLN
60.0000 mg | Freq: Once | INTRAMUSCULAR | Status: AC
  Administered 2019-11-13: 60 mg via INTRAMUSCULAR

## 2019-11-13 NOTE — Patient Instructions (Signed)
Try to exercises regularly Ok to start walking Ice is your friend See me in 3 months

## 2019-11-13 NOTE — Addendum Note (Signed)
Addended by: Douglass Rivers T on: 11/13/2019 01:34 PM   Modules accepted: Orders

## 2019-11-13 NOTE — Assessment & Plan Note (Addendum)
Patient has had bilateral injections in the sacroiliac joint with minimal improvement previously.  Patient does respond well to Toradol and Depo-Medrol.  We did an injection today.  Worsening pain will consider the possibility of a burst of steroids.  Encourage patient to continue the exercises.  Patient will avoid formal physical therapy secondary to coronavirus outbreak.  Follow-up with me again in 3 months spent  25 minutes with patient face-to-face and had greater than 50% of counseling including as described above in assessment and plan.

## 2019-11-15 ENCOUNTER — Ambulatory Visit (INDEPENDENT_AMBULATORY_CARE_PROVIDER_SITE_OTHER): Payer: Medicare Other | Admitting: Family

## 2019-11-15 ENCOUNTER — Ambulatory Visit: Payer: Medicare Other | Admitting: Internal Medicine

## 2019-11-15 ENCOUNTER — Ambulatory Visit (INDEPENDENT_AMBULATORY_CARE_PROVIDER_SITE_OTHER)
Admission: RE | Admit: 2019-11-15 | Discharge: 2019-11-15 | Disposition: A | Discharge status: Home / Self Care | Payer: Medicare Other | Source: Ambulatory Visit | Attending: Family | Admitting: Family

## 2019-11-15 ENCOUNTER — Encounter: Payer: Self-pay | Admitting: Family

## 2019-11-15 ENCOUNTER — Other Ambulatory Visit: Payer: Self-pay

## 2019-11-15 VITALS — BP 108/74 | HR 72 | Temp 98.4°F | Ht 59.0 in | Wt 147.2 lb

## 2019-11-15 DIAGNOSIS — F4321 Adjustment disorder with depressed mood: Secondary | ICD-10-CM | POA: Diagnosis not present

## 2019-11-15 DIAGNOSIS — R05 Cough: Secondary | ICD-10-CM

## 2019-11-15 DIAGNOSIS — R053 Chronic cough: Secondary | ICD-10-CM

## 2019-11-15 DIAGNOSIS — F1021 Alcohol dependence, in remission: Secondary | ICD-10-CM

## 2019-11-15 MED ORDER — PANTOPRAZOLE SODIUM 40 MG PO TBEC: 40.0000 mg | DELAYED_RELEASE_TABLET | Freq: Two times a day (BID) | ORAL | 0 refills | Status: DC

## 2019-11-15 NOTE — Progress Notes (Signed)
[name] [name] is a 68 y.o. female with the following history as recorded in Epic[employer]:  Patient Active Problem List   Diagnosis Date Noted  . Sacroiliac pain 08/10/2019  . Anxiety and depression 05/11/2019  . Chest pain 05/09/2019  . Arthritis of sacroiliac joint of both sides 05/01/2019  . Polyarthralgia 12/28/2018  . Preventative health care 10/20/2018  . RLS (restless legs syndrome) 09/27/2018  . Insomnia disorder related to known organic factor 06/27/2018  . PLMD (periodic limb movement disorder) 06/27/2018  . Hot flashes due to menopause 04/25/2018  . Palpitations 04/25/2018  . Diaphoresis 04/25/2018  . Long term current use of antithrombotics/antiplatelets 04/19/2018  . Insomnia 04/19/2018  . Intractable episodic headache 02/28/2018  . Numbness and tingling of right arm 02/28/2018  . Toe pain, right 10/19/2017  . Rotator cuff arthropathy of left shoulder 06/02/2017  . Trigger thumb of left hand 06/02/2017  . Lipoma 05/06/2017  . Pain of left thumb 05/06/2017  . Chronic pain syndrome 12/15/2016  . Lumbar degenerative disc disease 12/15/2016  . Left leg pain 01/28/2016  . Left shoulder pain 04/09/2015  . Impaired glucose tolerance 07/30/2013  . Abnormal breath sounds 07/30/2013  . Left carotid stenosis 05/05/2012  . Anxiety 04/03/2011  . CVA (cerebral infarction) 03/15/2011  . HLD (hyperlipidemia) 11/03/2010  . Hx of adenomatous colonic polyps 02/09/2008  . Hypothyroidism 11/06/2007  . ALLERGIC RHINITIS 11/06/2007  . OSTEOPOROSIS 11/06/2007  . ALCOHOL ABUSE, HX OF 11/06/2007  . Essential hypertension 09/13/2007  . Asthma 09/13/2007  . PERIMENOPAUSAL STATUS 09/13/2007  . Chronic insomnia 09/13/2007    Current Outpatient Medications  Medication Sig Dispense Refill  . acetaminophen (TYLENOL) 325 MG tablet Take 650 mg by mouth every 6 (six) hours as needed for mild pain.     Marland Kitchen amLODipine (NORVASC) 5 MG tablet TAKE 1 TABLET (5 MG TOTAL) BY MOUTH DAILY AFTER SUPPER. 90  tablet 0  . atorvastatin (LIPITOR) 40 MG tablet Take 1 tablet (40 mg total) by mouth daily. 90 tablet 3  . benazepril-hydrochlorthiazide (LOTENSIN HCT) 10-12.5 MG tablet Take 1 tablet by mouth daily. 90 tablet 3  . citalopram (CELEXA) 20 MG tablet Take 1 tablet (20 mg total) by mouth daily. 90 tablet 3  . clopidogrel (PLAVIX) 75 MG tablet TAJE 1 TABLET BY MOUTH DAILY. (Patient taking differently: Take 75 mg by mouth daily. ) 90 tablet 1  . ezetimibe (ZETIA) 10 MG tablet Take 1 tablet (10 mg total) by mouth daily after supper. 90 tablet 1  . fluticasone furoate-vilanterol (BREO ELLIPTA) 100-25 MCG/INH AEPB Inhale 1 puff into the lungs daily. 28 each 2  . levalbuterol (XOPENEX HFA) 45 MCG/ACT inhaler Inhale 2 puffs into the lungs 4 (four) times daily. 1 Inhaler 12  . levothyroxine (SYNTHROID) 75 MCG tablet Take 1 tablet (75 mcg total) by mouth daily before breakfast. 90 tablet 2  . metoprolol succinate (TOPROL-XL) 25 MG 24 hr tablet Take 1 tablet (25 mg total) by mouth daily. 90 tablet 3  . montelukast (SINGULAIR) 10 MG tablet TAKE 1 TABLET BY MOUTH EVERY DAY 90 tablet 3  . pantoprazole (PROTONIX) 40 MG tablet Take 1 tablet (40 mg total) by mouth 2 (two) times daily before a meal. 60 tablet 0  . rOPINIRole (REQUIP) 0.5 MG tablet Take 1 tablet (0.5 mg total) by mouth at bedtime. 90 tablet 3  . spironolactone (ALDACTONE) 25 MG tablet TAKE 1 TABLET BY MOUTH EVERY DAY IN THE MORNING 90 tablet 0  . traMADol (ULTRAM) 50 MG  tablet Take 1 tablet (50 mg total) by mouth every 8 (eight) hours as needed. 90 tablet 5  . zolpidem (AMBIEN) 10 MG tablet Take 1 tablet (10 mg total) by mouth at bedtime as needed for sleep. 90 tablet 1   No current facility-administered medications for this visit.     Allergies: Fosamax [alendronate sodium] and Penicillins  Past Medical History:  Diagnosis Date  . ALCOHOL ABUSE, HX OF 11/06/2007  . ALLERGIC RHINITIS 11/06/2007  . Anxiety 04/03/2011  . ASTHMA 09/13/2007  . Chronic  pain syndrome 12/15/2016  . COLONIC POLYPS, HX OF 02/09/2008    ADENOMATOUS POLYP  . Encounter for well adult exam without abnormal findings 04/03/2011  . HYPERLIPIDEMIA 11/03/2010  . HYPERTENSION 09/13/2007  . HYPOTHYROIDISM 11/06/2007  . Impaired glucose tolerance 07/30/2013  . INSOMNIA, HX OF 09/13/2007  . Lumbar degenerative disc disease 12/15/2016  . OSTEOPOROSIS 11/06/2007  . PERIMENOPAUSAL STATUS 09/13/2007  . RLS (restless legs syndrome)   . Stroke ([place])   . TIA (transient ischemic attack) 11/04/2011    Past Surgical History:  Procedure Laterality Date  . BREAST EXCISIONAL BIOPSY     left  . BREAST SURGERY  2008 and 2012   x 2 - benign, left side  . carotid artery surgery Left   . CESAREAN SECTION     x 3  . COLONOSCOPY  multiple   2010    Family History  Problem Relation Age of Onset  . Hyperlipidemia Other   . Hypertension Other   . Coronary artery disease Other   . Breast cancer Cousin   . Hypertension Mother   . Heart disease Mother   . Heart failure Mother   . Dementia Maternal Grandmother   . Colon cancer Neg Hx     Social History   Tobacco Use  . Smoking status: Former Smoker    Packs/day: 0.25    Types: Cigarettes    Quit date: 05/31/1979    Years since quitting: 40.4  . Smokeless tobacco: Never Used  . Tobacco comment: social smoker  Substance Use Topics  . Alcohol use: No    Subjective:  1 month follow-up: 1) Noticed initial response to BREO but cough has continued to be problematic especially at night; last CXR was done in 04/2019; admits to increased burping, belching- does not take reflux medication. Does not want to continue to pay for BREO due to cost concerns; 2) Depression related to sudden loss of her mother last year; no change with increased Celexa dosage; has decided she would be open to meeting with a counselor;   Objective:  Vitals:   11/15/19 0944  BP: 108/74  Pulse: 72  Temp: 98.4 F (36.9 C)  TempSrc: Oral  SpO2: 98%  Weight:  147 lb 3.2 oz (66.8 kg)  Height: 4\' 11"  (1.499 m)    General: Well developed, well nourished, in no acute distress  Skin : Warm and dry.  Head: Normocephalic and atraumatic  Eyes: Sclera and conjunctiva clear; pupils round and reactive to light; extraocular movements intact  Ears: External normal; canals clear; tympanic membranes normal  Oropharynx: Pink, supple. No suspicious lesions  Neck: Supple without thyromegaly, adenopathy  Lungs: Respirations unlabored; clear to auscultation bilaterally without wheeze, rales, rhonchi  CVS exam: normal rate and regular rhythm.  Neurologic: Alert and oriented; speech intact; face symmetrical; moves all extremities well; CNII-XII intact without focal deficit   Assessment:  1. Chronic cough   2. Personal history of alcoholism ([place]) Chronic  3.  Situational depression     Plan:  1. Update CXR; suspect GERD contributing as well; trial of Protonix 40 mg bid and sample of Symbicort 80/4.5 2 puffs bid; will re-evaluate response by phone in about 1 month. 2. Sober- no concerns; 3. Continue Citalopram at 20 mg daily; patient wants to meet with counselor once our office gets moved to new locations and we have a therapist in house. Plan to schedule in January  2021.   This visit occurred during the SARS-CoV-2 public health emergency.  Safety protocols were in place, including screening questions prior to the visit, additional usage of staff PPE, and extensive cleaning of exam room while observing appropriate contact time as indicated for disinfecting solutions.    No follow-ups on file.  Orders Placed This Encounter  Procedures  . DG Chest 2 View    Standing Status:   Future    Number of Occurrences:   1    Standing Expiration Date:   01/15/2021    Order Specific Question:   Reason for Exam (SYMPTOM  OR DIAGNOSIS REQUIRED)    Answer:   chronic cough    Order Specific Question:   Preferred imaging location?    Answer:   [facility]    Order  Specific Question:   Radiology Contrast Protocol - do NOT remove file path    Answer:   [email or web address]    Requested Prescriptions   Signed Prescriptions Disp Refills  . pantoprazole (PROTONIX) 40 MG tablet 60 tablet 0    Sig: Take 1 tablet (40 mg total) by mouth 2 (two) times daily before a meal.

## 2019-12-07 ENCOUNTER — Other Ambulatory Visit: Payer: Self-pay | Admitting: Family

## 2019-12-26 ENCOUNTER — Other Ambulatory Visit: Payer: Self-pay | Admitting: Neurology

## 2019-12-29 ENCOUNTER — Other Ambulatory Visit: Payer: Self-pay | Admitting: Internal Medicine

## 2020-01-03 ENCOUNTER — Telehealth: Payer: Self-pay | Admitting: Family

## 2020-01-03 ENCOUNTER — Other Ambulatory Visit: Payer: Self-pay | Admitting: Family

## 2020-01-03 MED ORDER — PANTOPRAZOLE SODIUM 40 MG PO TBEC: 40.0000 mg | DELAYED_RELEASE_TABLET | Freq: Two times a day (BID) | ORAL | 1 refills | Status: DC

## 2020-01-03 NOTE — Telephone Encounter (Signed)
-----   Message from Marrian Salvage, [place] sent at 11/15/2019  9:56 AM EST ----- Ask about behavioral health referral

## 2020-01-03 NOTE — Telephone Encounter (Signed)
Please see if she is still interested in me setting her up for behavioral health referral?

## 2020-01-05 ENCOUNTER — Other Ambulatory Visit: Payer: Self-pay | Admitting: Family

## 2020-01-05 DIAGNOSIS — F419 Anxiety disorder, unspecified: Secondary | ICD-10-CM

## 2020-01-13 ENCOUNTER — Other Ambulatory Visit: Payer: Self-pay | Admitting: Cardiology

## 2020-01-13 DIAGNOSIS — I10 Essential (primary) hypertension: Secondary | ICD-10-CM

## 2020-01-17 ENCOUNTER — Other Ambulatory Visit: Payer: Self-pay | Admitting: Internal Medicine

## 2020-01-17 NOTE — Telephone Encounter (Signed)
Please refill as per office routine med refill policy (all routine meds refilled for 3 mo or monthly per pt preference up to one year from last visit, then month to month grace period for 3 mo, then further med refills will have to be denied)  

## 2020-01-23 ENCOUNTER — Ambulatory Visit (INDEPENDENT_AMBULATORY_CARE_PROVIDER_SITE_OTHER): Payer: Medicare PPO | Admitting: Psychology

## 2020-01-23 DIAGNOSIS — F331 Major depressive disorder, recurrent, moderate: Secondary | ICD-10-CM | POA: Diagnosis not present

## 2020-01-28 ENCOUNTER — Other Ambulatory Visit: Payer: Self-pay | Admitting: Internal Medicine

## 2020-01-28 NOTE — Telephone Encounter (Signed)
Please refill as per office routine med refill policy (all routine meds refilled for 3 mo or monthly per pt preference up to one year from last visit, then month to month grace period for 3 mo, then further med refills will have to be denied)  

## 2020-01-30 ENCOUNTER — Ambulatory Visit (INDEPENDENT_AMBULATORY_CARE_PROVIDER_SITE_OTHER): Payer: Medicare PPO | Admitting: Psychology

## 2020-01-30 DIAGNOSIS — F331 Major depressive disorder, recurrent, moderate: Secondary | ICD-10-CM | POA: Diagnosis not present

## 2020-02-05 ENCOUNTER — Encounter: Payer: Self-pay | Admitting: Cardiology

## 2020-02-05 ENCOUNTER — Ambulatory Visit: Payer: Medicare PPO | Admitting: Cardiology

## 2020-02-05 ENCOUNTER — Other Ambulatory Visit: Payer: Self-pay

## 2020-02-05 VITALS — BP 130/72 | HR 74 | Ht 59.0 in | Wt 149.6 lb

## 2020-02-05 DIAGNOSIS — R002 Palpitations: Secondary | ICD-10-CM

## 2020-02-05 DIAGNOSIS — Z9889 Other specified postprocedural states: Secondary | ICD-10-CM

## 2020-02-05 DIAGNOSIS — I739 Peripheral vascular disease, unspecified: Secondary | ICD-10-CM

## 2020-02-05 DIAGNOSIS — I6523 Occlusion and stenosis of bilateral carotid arteries: Secondary | ICD-10-CM

## 2020-02-05 DIAGNOSIS — E7800 Pure hypercholesterolemia, unspecified: Secondary | ICD-10-CM

## 2020-02-05 NOTE — Progress Notes (Signed)
Primary Physician/Referring:  Marrian Salvage, FNP  Patient ID: [name] [name], female    DOB: Dec 21, 1950, 69 y.o.   MRN: [id number]  Chief Complaint  Patient presents with  . Hypertension  . Palpitations  . Follow-up    46mo   HPI: [name] [name]  is a 69 y.o. female  with history of left carotid endarterectomy due to symptomatic carotid stenosis in 2012 with TIA, asymptomatic right carotid stenosis, bronchial asthma, hypertension, hyperlipidemia, hyperglycemia presents for 6 mont OV.   She had difficult to control hypertension which is now well controlled on spironolactone, hypokalemia is resolved.  Her main complaint today is back pain due to spinal disease but more importantly she is having severe cramping in her calves even with minimal activity and has gradually gotten worse.  She also has resting night cramps.  Denies chest pain, dyspnea has remained stable, she is seeing an orthopedic physician soon.  Past Medical History:  Diagnosis Date  . ALCOHOL ABUSE, HX OF 11/06/2007  . ALLERGIC RHINITIS 11/06/2007  . Anxiety 04/03/2011  . ASTHMA 09/13/2007  . Chronic pain syndrome 12/15/2016  . COLONIC POLYPS, HX OF 02/09/2008    ADENOMATOUS POLYP  . Encounter for well adult exam without abnormal findings 04/03/2011  . HYPERLIPIDEMIA 11/03/2010  . HYPERTENSION 09/13/2007  . HYPOTHYROIDISM 11/06/2007  . Impaired glucose tolerance 07/30/2013  . INSOMNIA, HX OF 09/13/2007  . Lumbar degenerative disc disease 12/15/2016  . OSTEOPOROSIS 11/06/2007  . PERIMENOPAUSAL STATUS 09/13/2007  . RLS (restless legs syndrome)   . Stroke ([place])   . TIA (transient ischemic attack) 11/04/2011    Past Surgical History:  Procedure Laterality Date  . BREAST EXCISIONAL BIOPSY     left  . BREAST SURGERY  2008 and 2012   x 2 - benign, left side  . carotid artery surgery Left   . CESAREAN SECTION     x 3  . COLONOSCOPY  multiple   2010   Social History   Tobacco Use  . Smoking status: Former  Smoker    Packs/day: 0.25    Types: Cigarettes    Quit date: 05/31/1979    Years since quitting: 40.7  . Smokeless tobacco: Never Used  . Tobacco comment: social smoker  Substance Use Topics  . Alcohol use: No    Comment: former alcholic    Family History  Problem Relation Age of Onset  . Hyperlipidemia Other   . Hypertension Other   . Coronary artery disease Other   . Breast cancer Cousin   . Hypertension Mother   . Heart disease Mother   . Heart failure Mother   . Dementia Maternal Grandmother   . Colon cancer Neg Hx     Review of Systems  Cardiovascular: Positive for claudication, dyspnea on exertion and leg swelling (occasional).  Respiratory: Positive for wheezing (occasional).   Musculoskeletal: Positive for arthritis and back pain.  Gastrointestinal: Negative for melena.  All other systems reviewed and are negative.  Objective  Blood pressure 130/72, pulse 74, height 4\' 11"  (1.499 m), weight 149 lb 9.6 oz (67.9 kg), SpO2 96 %. Body mass index is 30.22 kg/m.   Vitals with BMI 02/05/2020 11/15/2019 11/13/2019  Height 4\' 11"  4\' 11"  4\' 11"   Weight 149 lbs 10 oz 147 lbs 3 oz 149 lbs  BMI 30.2 [id number]  Systolic [id number]  Diastolic 72 74 70  Pulse 74 72 67      Physical Exam  Constitutional: She appears well-nourished.  No distress.  Short stature  HENT:  Head: Atraumatic.  Cardiovascular: Normal rate, regular rhythm, S1 normal, S2 normal and intact distal pulses. Exam reveals no gallop.  No murmur heard. Pulses:      Carotid pulses are on the right side with bruit.      Popliteal pulses are 0 on the right side and 0 on the left side.       Dorsalis pedis pulses are 1+ on the right side and 1+ on the left side.       Posterior tibial pulses are 0 on the right side and 0 on the left side.  Left carotid endarterectomy scar noted  Pulmonary/Chest: Effort normal and breath sounds normal.  Abdominal: Soft. Bowel sounds are normal.   Radiology: No results  found.  Laboratory examination:   CMP Latest Ref Rng & Units 10/17/2019 10/16/2019 06/09/2019  Glucose 65 - 99 mg/dL 104(H) 110(H) 99  BUN 8 - 27 mg/dL 13 22 12   Creatinine 0.57 - 1.00 mg/dL 0.99 1.03 1.00  Sodium 134 - 144 mmol/L 136 136 139  Potassium 3.5 - 5.2 mmol/L 4.6 4.2 4.1  Chloride 96 - 106 mmol/L 100 102 100  CO2 20 - 29 mmol/L 26 27 23   Calcium 8.7 - 10.3 mg/dL 9.5 9.3 9.6  Total Protein 6.0 - 8.5 g/dL 6.3 6.8 -  Total Bilirubin 0.0 - 1.2 mg/dL 0.5 0.4 -  Alkaline Phos 39 - 117 IU/L 105 93 -  AST 0 - 40 IU/L 16 16 -  ALT 0 - 32 IU/L 14 16 -   CBC Latest Ref Rng & Units 05/09/2019 05/01/2019 04/19/2018  WBC 4.0 - 10.5 K/uL 6.3 4.5 5.9  Hemoglobin 12.0 - 15.0 g/dL 12.9 13.2 13.7  Hematocrit 36.0 - 46.0 % 39.1 38.8 41.2  Platelets 150 - 400 K/uL 216 198.0 222.0   Lipid Panel     Component Value Date/Time   CHOL 144 10/17/2019 1403   TRIG 48 10/17/2019 1403   TRIG 50 10/29/2006 1025   HDL 64 10/17/2019 1403   CHOLHDL 3 05/01/2019 1312   VLDL 14.8 05/01/2019 1312   LDLCALC 69 10/17/2019 1403   LDLDIRECT 72 10/17/2019 1403   LDLDIRECT 127.1 10/24/2010 0910   HEMOGLOBIN A1C Lab Results  Component Value Date   HGBA1C 6.4 10/16/2019   MPG 128 (H) 03/04/2011   TSH Recent Labs    05/01/19 1312 10/16/19 1212  TSH 1.32 0.95     Medications   Medications Discontinued During This Encounter  Medication Reason  . acetaminophen (TYLENOL) 325 MG tablet Change in therapy  . levalbuterol (XOPENEX HFA) 45 MCG/ACT inhaler Change in therapy   Current Meds  Medication Sig  . amLODipine (NORVASC) 5 MG tablet TAKE 1 TABLET (5 MG TOTAL) BY MOUTH DAILY AFTER SUPPER.  Marland Kitchen atorvastatin (LIPITOR) 40 MG tablet Take 1 tablet (40 mg total) by mouth daily. Annual appt due in May must see provider for future refills  . benazepril-hydrochlorthiazide (LOTENSIN HCT) 10-12.5 MG tablet Take 1 tablet by mouth daily. Annual appt due in May must see provider for future refills  . citalopram  (CELEXA) 20 MG tablet Take 1 tablet (20 mg total) by mouth daily.  . clopidogrel (PLAVIX) 75 MG tablet TAKE 1 TABLET BY MOUTH EVERY DAY Annual appt due in May must see provider for future refills  . diphenhydrAMINE-APAP, sleep, (TYLENOL PM EXTRA STRENGTH PO) Take by mouth at bedtime.  Marland Kitchen ezetimibe (ZETIA) 10 MG tablet Take 1 tablet (10 mg  total) by mouth daily after supper.  . fluticasone furoate-vilanterol (BREO ELLIPTA) 100-25 MCG/INH AEPB Inhale 1 puff into the lungs daily.  Marland Kitchen levothyroxine (SYNTHROID) 75 MCG tablet TAKE 1 TABLET (75 MCG TOTAL) BY MOUTH DAILY BEFORE BREAKFAST.  . metoprolol succinate (TOPROL-XL) 25 MG 24 hr tablet Take 1 tablet (25 mg total) by mouth daily.  . montelukast (SINGULAIR) 10 MG tablet TAKE 1 TABLET BY MOUTH EVERY DAY  . pantoprazole (PROTONIX) 40 MG tablet Take 1 tablet (40 mg total) by mouth 2 (two) times daily.  Marland Kitchen rOPINIRole (REQUIP) 0.5 MG tablet Take 1 tablet (0.5 mg total) by mouth at bedtime.  Marland Kitchen spironolactone (ALDACTONE) 25 MG tablet TAKE 1 TABLET BY MOUTH EVERY DAY IN THE MORNING  . traMADol (ULTRAM) 50 MG tablet Take 1 tablet (50 mg total) by mouth every 8 (eight) hours as needed.  . zolpidem (AMBIEN) 10 MG tablet Take 1 tablet (10 mg total) by mouth at bedtime as needed for sleep.    Cardiac Studies:  Event Monitor for 14 days Start date 06/02/2019:  Predominant rhythm is normal sinus rhythm.:  Symptomatic transmissions of flutter and chest pain revealed normal sinus rhythm.  Automatic detected event revealed one episode of 12 beat (8 Sec) wide complex tachycardia suggestive of NSVT at 116 bpm at 2:30 PM on 06/10/2019.  Lexiscan Myoview Stress Test 06/26/2019: Resting EKG demonstrates sinus rhythm with short PR interval, borderline criteria for LVH.   stress EKG is nondiagnostic for ischemia as his of alcoholic stress test. Symptoms during test were SOB and Dizziness.  Myocardial pefusion imaging is normal. Left ventricular ejection fraction is  67% with  normal wall motion. Low risk study.  Carotid artery duplex  07/07/2019: Stenosis in the right internal carotid artery (16-49%). Minimal stenosis in the left internal carotid artery (minimal). Patent left endarterectomy site.  Antegrade right vertebral artery flow. Antegrade left vertebral artery flow. Follow up in one year is appropriate if clinically indicated.  Echocardiogram [id number]:  Normal systolic function, EF 123456.  Mild LVH.  Grade 2 diastolic dysfunction.  Moderately dilated left atrium. Mild thickening of mitral valve with mild calcification.  Mild aortic valve weakening and calcification.  Assessment     ICD-10-CM   1. Palpitations  R00.2 EKG 12-Lead  2. Hypercholesteremia  E78.00   3. Asymptomatic bilateral carotid artery stenosis  I65.23   4. H/O left carotid endarterectomy 03/09/11  Z98.890   5. Claudication in peripheral vascular disease (HCC)  I73.9 PCV LOWER ARTERIAL (BILATERAL)   EKG 05/31/2019: Sinus rhythm with short PR interval, PR interval 116 ms.  Possible left atrial abnormality.  No evidence of ischemia, normal QT interval.  Recommendations:   [name] [name]  is a 69 y.o. AAFpatient  with history of left carotid endarterectomy due to symptomatic carotid stenosis in 2012 with TIA, asymptomatic right carotid stenosis, bronchial asthma, hypertension, hyperlipidemia, hyperglycemia presents for 6 mont OV.   Blood pressure is well controlled, reviewed her labs, lipids are also under excellent control.  She has not had any further palpitations since being on metoprolol.  Main complaint being claudication and abdominal physical exam consistent with PAD, will obtain lower extremity arterial duplex.  She also has back pain that is unrelated to vascular disease and seeing orthopedic physician soon.  We will continue with carotid screening as well.  I will see her back in 3 months for follow-up or sooner if symptoms are getting worse.  Weight loss was discussed.   Adrian Prows, MD, [facility]  02/05/2020, 4:16 PM [facility]

## 2020-02-05 NOTE — Progress Notes (Signed)
Primary Physician/Referring:  Marrian Salvage, FNP  Patient ID: [name] [name], female    DOB: 09/07/1951, 69 y.o.   MRN: [id number]  Chief Complaint  Patient presents with  . Hypertension  . Palpitations  . Follow-up    45mo   HPI: [name] [name]  is a 69 y.o. female  with history of left carotid endarterectomy due to symptomatic carotid stenosis in 2012 with TIA, bronchial asthma, hypertension, hyperlipidemia, hyperglycemia admitted to the hospital on 05/09/2019 discharged home the following day when she presented with 1 week onset of intermittent palpitations lasting for 5 to 10 minutes, associated with dyspnea and left-sided chest tightness with radiation to the left arm.  I'd seen a month ago, I started her on spironolactone for hypertension, Discontinued potassium supplement, performed event monitor, echocardiogram and nuclear stress test and she now presents for follow-up.  I'd also started her on Zetia.  States that she is tolerating all the medications well, has noticed marked improvement in blood pressure since then.  Fortunately has not had any further chest pain, but she has had brief episodes of palpitations.  No dizziness or syncope.  Past Medical History:  Diagnosis Date  . ALCOHOL ABUSE, HX OF 11/06/2007  . ALLERGIC RHINITIS 11/06/2007  . Anxiety 04/03/2011  . ASTHMA 09/13/2007  . Chronic pain syndrome 12/15/2016  . COLONIC POLYPS, HX OF 02/09/2008    ADENOMATOUS POLYP  . Encounter for well adult exam without abnormal findings 04/03/2011  . HYPERLIPIDEMIA 11/03/2010  . HYPERTENSION 09/13/2007  . HYPOTHYROIDISM 11/06/2007  . Impaired glucose tolerance 07/30/2013  . INSOMNIA, HX OF 09/13/2007  . Lumbar degenerative disc disease 12/15/2016  . OSTEOPOROSIS 11/06/2007  . PERIMENOPAUSAL STATUS 09/13/2007  . RLS (restless legs syndrome)   . Stroke ([place])   . TIA (transient ischemic attack) 11/04/2011    Past Surgical History:  Procedure Laterality Date  . BREAST  EXCISIONAL BIOPSY     left  . BREAST SURGERY  2008 and 2012   x 2 - benign, left side  . carotid artery surgery Left   . CESAREAN SECTION     x 3  . COLONOSCOPY  multiple   2010    Social History   Socioeconomic History  . Marital status: Divorced    Spouse name: Not on file  . Number of children: 3  . Years of education: Not on file  . Highest education level: Not on file  Occupational History  . Occupation: Nurse, mental health: [employer]  Tobacco Use  . Smoking status: Former Smoker    Packs/day: 0.25    Types: Cigarettes    Quit date: 05/31/1979    Years since quitting: 40.7  . Smokeless tobacco: Never Used  . Tobacco comment: social smoker  Substance and Sexual Activity  . Alcohol use: No    Comment: former alcholic  . Drug use: No  . Sexual activity: Not on file  Other Topics Concern  . 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 Difficulty of Paying Living Expenses: Not on file  Food Insecurity:   . Worried About Charity fundraiser in the Last Year: Not on file  . Ran Out of Food in the Last Year: Not  on file  Transportation Needs:   . Lack of Transportation (Medical): Not on file  . Lack of Transportation (Non-Medical): Not on file  Physical Activity:   . Days of Exercise per Week: Not on file  . Minutes of Exercise per Session: Not on file  Stress:   . Feeling of Stress : Not on file  Social Connections:   . Frequency of Communication with Friends and Family: Not on file  . Frequency of Social Gatherings with Friends and Family: Not on file  . Attends Religious Services: Not on file  . Active Member of Clubs or Organizations: Not on file  . Attends Archivist Meetings: Not on file  . Marital Status: Not on file    Intimate Partner Violence:   . Fear of Current or Ex-Partner: Not on file  . Emotionally Abused: Not on file  . Physically Abused: Not on file  . Sexually Abused: Not on file   Review of Systems  Constitution: Negative for chills, decreased appetite, malaise/fatigue and weight gain.  Cardiovascular: Positive for palpitations. Negative for chest pain, dyspnea on exertion, leg swelling and syncope.  Endocrine: Negative for cold intolerance.  Hematologic/Lymphatic: Does not bruise/bleed easily.  Musculoskeletal: Negative for joint swelling.  Gastrointestinal: Negative for abdominal pain, anorexia, change in bowel habit, hematochezia and melena.  Neurological: Negative for headaches and light-headedness.  Psychiatric/Behavioral: Negative for depression and substance abuse.  All other systems reviewed and are negative.  Objective  Blood pressure 130/72, pulse 74, height 4\' 11"  (1.499 m), weight 149 lb 9.6 oz (67.9 kg), SpO2 96 %. Body mass index is 30.22 kg/m.    Physical Exam  Constitutional: She appears well-nourished. No distress.  Short stature  HENT:  Head: Atraumatic.  Eyes: Conjunctivae are normal.  Neck: No JVD present. No thyromegaly present.  Cardiovascular: Normal rate, regular rhythm, S1 normal, S2 normal, intact distal pulses and normal pulses. Exam reveals no gallop.  No murmur heard. Pulses:      Carotid pulses are on the right side with bruit. Left carotid endarterectomy scar noted  Pulmonary/Chest: Effort normal and breath sounds normal.  Abdominal: Soft. Bowel sounds are normal.  Musculoskeletal:        General: No edema. Normal range of motion.     Cervical back: Neck supple.  Neurological: She is alert.  Skin: Skin is warm and dry.  Psychiatric: She has a normal mood and affect.   Radiology: No results found.  Laboratory examination:   CMP Latest Ref Rng & Units 10/17/2019 10/16/2019 06/09/2019  Glucose 65 - 99 mg/dL 104(H) 110(H) 99  BUN 8 - 27 mg/dL 13  22 12   Creatinine 0.57 - 1.00 mg/dL 0.99 1.03 1.00  Sodium 134 - 144 mmol/L 136 136 139  Potassium 3.5 - 5.2 mmol/L 4.6 4.2 4.1  Chloride 96 - 106 mmol/L 100 102 100  CO2 20 - 29 mmol/L 26 27 23   Calcium 8.7 - 10.3 mg/dL 9.5 9.3 9.6  Total Protein 6.0 - 8.5 g/dL 6.3 6.8 -  Total Bilirubin 0.0 - 1.2 mg/dL 0.5 0.4 -  Alkaline Phos 39 - 117 IU/L 105 93 -  AST 0 - 40 IU/L 16 16 -  ALT 0 - 32 IU/L 14 16 -   CBC Latest Ref Rng & Units 05/09/2019 05/01/2019 04/19/2018  WBC 4.0 - 10.5 K/uL 6.3 4.5 5.9  Hemoglobin 12.0 - 15.0 g/dL 12.9 13.2 13.7  Hematocrit 36.0 - 46.0 % 39.1 38.8 41.2  Platelets 150 - 400  K/uL 216 198.0 222.0   Lipid Panel     Component Value Date/Time   CHOL 144 10/17/2019 1403   TRIG 48 10/17/2019 1403   TRIG 50 10/29/2006 1025   HDL 64 10/17/2019 1403   CHOLHDL 3 05/01/2019 1312   VLDL 14.8 05/01/2019 1312   LDLCALC 69 10/17/2019 1403   LDLDIRECT 72 10/17/2019 1403   LDLDIRECT 127.1 10/24/2010 0910   HEMOGLOBIN A1C Lab Results  Component Value Date   HGBA1C 6.4 10/16/2019   MPG 128 (H) 03/04/2011   TSH Recent Labs    05/01/19 1312 10/16/19 1212  TSH 1.32 0.95     Medications   Medications Discontinued During This Encounter  Medication Reason  . acetaminophen (TYLENOL) 325 MG tablet Change in therapy  . levalbuterol (XOPENEX HFA) 45 MCG/ACT inhaler Change in therapy   Current Meds  Medication Sig  . amLODipine (NORVASC) 5 MG tablet TAKE 1 TABLET (5 MG TOTAL) BY MOUTH DAILY AFTER SUPPER.  Marland Kitchen atorvastatin (LIPITOR) 40 MG tablet Take 1 tablet (40 mg total) by mouth daily. Annual appt due in May must see provider for future refills  . benazepril-hydrochlorthiazide (LOTENSIN HCT) 10-12.5 MG tablet Take 1 tablet by mouth daily. Annual appt due in May must see provider for future refills  . citalopram (CELEXA) 20 MG tablet Take 1 tablet (20 mg total) by mouth daily.  . clopidogrel (PLAVIX) 75 MG tablet TAKE 1 TABLET BY MOUTH EVERY DAY Annual appt due in May  must see provider for future refills  . diphenhydrAMINE-APAP, sleep, (TYLENOL PM EXTRA STRENGTH PO) Take by mouth at bedtime.  Marland Kitchen ezetimibe (ZETIA) 10 MG tablet Take 1 tablet (10 mg total) by mouth daily after supper.  . fluticasone furoate-vilanterol (BREO ELLIPTA) 100-25 MCG/INH AEPB Inhale 1 puff into the lungs daily.  Marland Kitchen levothyroxine (SYNTHROID) 75 MCG tablet TAKE 1 TABLET (75 MCG TOTAL) BY MOUTH DAILY BEFORE BREAKFAST.  . metoprolol succinate (TOPROL-XL) 25 MG 24 hr tablet Take 1 tablet (25 mg total) by mouth daily.  . montelukast (SINGULAIR) 10 MG tablet TAKE 1 TABLET BY MOUTH EVERY DAY  . pantoprazole (PROTONIX) 40 MG tablet Take 1 tablet (40 mg total) by mouth 2 (two) times daily.  Marland Kitchen rOPINIRole (REQUIP) 0.5 MG tablet Take 1 tablet (0.5 mg total) by mouth at bedtime.  Marland Kitchen spironolactone (ALDACTONE) 25 MG tablet TAKE 1 TABLET BY MOUTH EVERY DAY IN THE MORNING  . traMADol (ULTRAM) 50 MG tablet Take 1 tablet (50 mg total) by mouth every 8 (eight) hours as needed.  . zolpidem (AMBIEN) 10 MG tablet Take 1 tablet (10 mg total) by mouth at bedtime as needed for sleep.    Cardiac Studies:  Event Monitor for 14 days Start date 06/02/2019:  Predominant rhythm is normal sinus rhythm.:  Symptomatic transmissions of flutter and chest pain revealed normal sinus rhythm.  Automatic detected event revealed one episode of 12 beat (8 Sec) wide complex tachycardia suggestive of NSVT at 116 bpm at 2:30 PM on 06/10/2019.  Lexiscan Myoview Stress Test 06/26/2019: Resting EKG demonstrates sinus rhythm with short PR interval, borderline criteria for LVH.   stress EKG is nondiagnostic for ischemia as his of alcoholic stress test. Symptoms during test were SOB and Dizziness.  Myocardial pefusion imaging is normal. Left ventricular ejection fraction is  67% with normal wall motion. Low risk study.  Carotid artery duplex  07/07/2019: Stenosis in the right internal carotid artery (16-49%). Minimal stenosis in the left  internal carotid artery (minimal). Patent left  endarterectomy site.  Antegrade right vertebral artery flow. Antegrade left vertebral artery flow. Follow up in one year is appropriate if clinically indicated.  Echocardiogram [id number]:  Normal systolic function, EF 123456.  Mild LVH.  Grade 2 diastolic dysfunction.  Moderately dilated left atrium. Mild thickening of mitral valve with mild calcification.  Mild aortic valve weakening and calcification.  Assessment     ICD-10-CM   1. Palpitations  R00.2 EKG 12-Lead  2. Hypercholesteremia  E78.00   3. Asymptomatic bilateral carotid artery stenosis  I65.23   4. H/O left carotid endarterectomy 03/09/11  Z98.890     EKG 05/31/2019: Sinus rhythm with short PR interval, PR interval 116 ms.  Possible left atrial abnormality.  No evidence of ischemia, normal QT interval.  Recommendations:  Patient presents to me for follow-up of palpitations, carotid stenosis, atypical chest pain.  Since being on spironolactone, blood pressure is now very well controlled.  I have also added Zetia on her prior office visit which is also tolerating this medication without any side effects.  Will obtain lipids and CMP in 2 months.  As she remains asymptomatic except for occasional palpitations which I reviewed the results of the event monitor which reveals brief atrial tachycardia, will continue observation only.  Only if the palpitations persisted for greater than 5-10 minutes she is advised to contact us back.   Adrian Prows, MD, [facility] 02/05/2020, 4:04 PM [facility] [place] Pager: [phone] Office: [phone] If no answer Cell [phone]

## 2020-02-06 ENCOUNTER — Ambulatory Visit (INDEPENDENT_AMBULATORY_CARE_PROVIDER_SITE_OTHER): Payer: Medicare PPO | Admitting: Psychology

## 2020-02-06 DIAGNOSIS — F331 Major depressive disorder, recurrent, moderate: Secondary | ICD-10-CM

## 2020-02-12 ENCOUNTER — Ambulatory Visit: Payer: Medicare Other | Admitting: Family Medicine

## 2020-02-13 ENCOUNTER — Ambulatory Visit: Payer: Medicare PPO | Admitting: Psychology

## 2020-02-17 ENCOUNTER — Other Ambulatory Visit: Payer: Self-pay | Admitting: Cardiology

## 2020-02-17 DIAGNOSIS — E7800 Pure hypercholesterolemia, unspecified: Secondary | ICD-10-CM

## 2020-02-20 ENCOUNTER — Ambulatory Visit (INDEPENDENT_AMBULATORY_CARE_PROVIDER_SITE_OTHER): Payer: Medicare PPO | Admitting: Psychology

## 2020-02-20 DIAGNOSIS — F331 Major depressive disorder, recurrent, moderate: Secondary | ICD-10-CM

## 2020-02-27 ENCOUNTER — Other Ambulatory Visit: Payer: Self-pay | Admitting: Family

## 2020-02-27 ENCOUNTER — Telehealth: Payer: Self-pay | Admitting: Family

## 2020-02-27 ENCOUNTER — Ambulatory Visit (INDEPENDENT_AMBULATORY_CARE_PROVIDER_SITE_OTHER): Payer: Medicare PPO | Admitting: Psychology

## 2020-02-27 DIAGNOSIS — F331 Major depressive disorder, recurrent, moderate: Secondary | ICD-10-CM

## 2020-02-27 MED ORDER — PANTOPRAZOLE SODIUM 40 MG PO TBEC: 40.0000 mg | DELAYED_RELEASE_TABLET | Freq: Two times a day (BID) | ORAL | 1 refills | Status: DC

## 2020-02-27 NOTE — Telephone Encounter (Signed)
Will you please call and check on her cough? She was supposed to let me know how the cough responded to the Protonix? Would like to see her in the office in about 2 months please.

## 2020-02-28 NOTE — Telephone Encounter (Signed)
Will see her in May-  Suspect the feeling of being dehydrated is related to her blood pressure medications; if she can deal with it, would not recommend changing anything.

## 2020-03-04 ENCOUNTER — Ambulatory Visit: Payer: Medicare Other | Admitting: Family Medicine

## 2020-03-04 ENCOUNTER — Other Ambulatory Visit: Payer: Self-pay

## 2020-03-04 ENCOUNTER — Encounter: Payer: Self-pay | Admitting: Family Medicine

## 2020-03-04 DIAGNOSIS — M5136 Other intervertebral disc degeneration, lumbar region: Secondary | ICD-10-CM | POA: Diagnosis not present

## 2020-03-04 MED ORDER — KETOROLAC TROMETHAMINE 60 MG/2ML IM SOLN
60.0000 mg | Freq: Once | INTRAMUSCULAR | Status: AC
  Administered 2020-03-04: 60 mg via INTRAMUSCULAR

## 2020-03-04 MED ORDER — PREDNISONE 50 MG PO TABS: ORAL_TABLET | ORAL | 0 refills | Status: DC

## 2020-03-04 MED ORDER — METHYLPREDNISOLONE ACETATE 80 MG/ML IJ SUSP
80.0000 mg | Freq: Once | INTRAMUSCULAR | Status: AC
  Administered 2020-03-04: 80 mg via INTRAMUSCULAR

## 2020-03-04 NOTE — Progress Notes (Signed)
[facility] [place] Phone: [phone] Subjective:   [name] [name], am serving as a scribe for Dr. Hulan Saas. This visit occurred during the SARS-CoV-2 public health emergency.  Safety protocols were in place, including screening questions prior to the visit, additional usage of staff PPE, and extensive cleaning of exam room while observing appropriate contact time as indicated for disinfecting solutions.   I'm seeing this patient by the request  of:  Marrian Salvage, FNP  CC: Low back pain follow-up  [id number]   11/13/2019 Patient has had bilateral injections in the sacroiliac joint with minimal improvement previously.  Patient does respond well to Toradol and Depo-Medrol.  We did an injection today.  Worsening pain will consider the possibility of a burst of steroids.  Encourage patient to continue the exercises.  Patient will avoid formal physical therapy secondary to coronavirus outbreak.  Follow-up with me again in 3 months spent  25 minutes with patient face-to-face and had greater than 50% of counseling including as described above in assessment and plan.  Update 03/04/2020 [name] [name] is a 69 y.o. female coming in with complaint of SI joint pain. Patient states that she has fallen twice since we last saw patient. Exacerbated SI joint pain and radiculopathy down both legs. Does home exercise program to help with pain.  Patient since then continues to have significant amount of pain.  Seems to be worsening if she anticipates overall.  Has had 2 falls since we have seen him previously both of them were accidents though.  Does not feel they were secondary to her back.    Past Medical History:  Diagnosis Date  . ALCOHOL ABUSE, HX OF 11/06/2007  . ALLERGIC RHINITIS 11/06/2007  . Anxiety 04/03/2011  . ASTHMA 09/13/2007  . Chronic pain syndrome 12/15/2016  . COLONIC POLYPS, HX OF 02/09/2008    ADENOMATOUS POLYP    . Encounter for well adult exam without abnormal findings 04/03/2011  . HYPERLIPIDEMIA 11/03/2010  . HYPERTENSION 09/13/2007  . HYPOTHYROIDISM 11/06/2007  . Impaired glucose tolerance 07/30/2013  . INSOMNIA, HX OF 09/13/2007  . Lumbar degenerative disc disease 12/15/2016  . OSTEOPOROSIS 11/06/2007  . PERIMENOPAUSAL STATUS 09/13/2007  . RLS (restless legs syndrome)   . Stroke ([place])   . TIA (transient ischemic attack) 11/04/2011   Past Surgical History:  Procedure Laterality Date  . BREAST EXCISIONAL BIOPSY     left  . BREAST SURGERY  2008 and 2012   x 2 - benign, left side  . carotid artery surgery Left   . CESAREAN SECTION     x 3  . COLONOSCOPY  multiple   2010   Social History   Socioeconomic History  . Marital status: Divorced    Spouse name: Not on file  . Number of children: 3  . Years of education: Not on file  . Highest education level: Not on file  Occupational History  . Occupation: Nurse, mental health: [employer]  Tobacco Use  . Smoking status: Former Smoker    Packs/day: 0.25    Types: Cigarettes    Quit date: 05/31/1979    Years since quitting: 40.7  . Smokeless tobacco: Never Used  . Tobacco comment: social smoker  Substance and Sexual Activity  . Alcohol use: [name]    Comment: former alcholic  . Drug use: [name]  . Sexual activity: Not on file  Other Topics Concern  . 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 Difficulty of Paying Living Expenses:   Food Insecurity:   . Worried About Charity fundraiser in the Last Year:   . Arboriculturist in the Last Year:   Transportation Needs:   . Film/video editor (Medical):   Marland Kitchen Lack of Transportation (Non-Medical):   Physical Activity:   . Days of Exercise per Week:   . Minutes of  Exercise per Session:   Stress:   . Feeling of Stress :   Social Connections:   . Frequency of Communication with Friends and Family:   . Frequency of Social Gatherings with Friends and Family:   . Attends Religious Services:   . Active Member of Clubs or Organizations:   . Attends Archivist Meetings:   Marland Kitchen Marital Status:    Allergies  Allergen Reactions  . Fosamax [Alendronate Sodium] Nausea And Vomiting  . Penicillins Hives    Did it involve swelling of the face/tongue/throat, SOB, or low BP? [name] Did it involve sudden or severe rash/hives, skin peeling, or any reaction on the inside of your mouth or nose? Yes Did you need to seek medical attention at a hospital or doctor's office? Yes When did it last happen?Over 10 years ago If all above answers are "[name]", may proceed with cephalosporin use.   Family History  Problem Relation Age of Onset  . Hyperlipidemia Other   . Hypertension Other   . Coronary artery disease Other   . Breast cancer Cousin   . Hypertension Mother   . Heart disease Mother   . Heart failure Mother   . Dementia Maternal Grandmother   . Colon cancer Neg Hx     Current Outpatient Medications (Endocrine & Metabolic):  .  levothyroxine (SYNTHROID) 75 MCG tablet, TAKE 1 TABLET (75 MCG TOTAL) BY MOUTH DAILY BEFORE BREAKFAST. Marland Kitchen  predniSONE (DELTASONE) 50 MG tablet, Take one tablet daily for the next 5 days.  Current Outpatient Medications (Cardiovascular):  .  amLODipine (NORVASC) 5 MG tablet, TAKE 1 TABLET (5 MG TOTAL) BY MOUTH DAILY AFTER SUPPER. Marland Kitchen  atorvastatin (LIPITOR) 40 MG tablet, Take 1 tablet (40 mg total) by mouth daily. Annual appt due in May must see provider for future refills .  benazepril-hydrochlorthiazide (LOTENSIN HCT) 10-12.5 MG tablet, Take 1 tablet by mouth daily. Annual appt due in May must see provider for future refills .  ezetimibe (ZETIA) 10 MG tablet, TAKE 1 TABLET (10 MG TOTAL) BY MOUTH DAILY AFTER SUPPER. .  metoprolol  succinate (TOPROL-XL) 25 MG 24 hr tablet, Take 1 tablet (25 mg total) by mouth daily. Marland Kitchen  spironolactone (ALDACTONE) 25 MG tablet, TAKE 1 TABLET BY MOUTH EVERY DAY IN THE MORNING  Current Outpatient Medications (Respiratory):  .  fluticasone furoate-vilanterol (BREO ELLIPTA) 100-25 MCG/INH AEPB, Inhale 1 puff into the lungs daily. .  montelukast (SINGULAIR) 10 MG tablet, TAKE 1 TABLET BY MOUTH EVERY DAY  Current Outpatient Medications (Analgesics):  .  traMADol (ULTRAM) 50 MG tablet, Take 1 tablet (50 mg total) by mouth every 8 (eight) hours as needed.  Current Outpatient Medications (Hematological):  .  clopidogrel (PLAVIX) 75 MG tablet, TAKE 1 TABLET BY MOUTH EVERY DAY Annual appt due in May must see  provider for future refills  Current Outpatient Medications (Other):  .  citalopram (CELEXA) 20 MG tablet, Take 1 tablet (20 mg total) by mouth daily. .  diphenhydrAMINE-APAP, sleep, (TYLENOL PM EXTRA STRENGTH PO), Take by mouth at bedtime. .  pantoprazole (PROTONIX) 40 MG tablet, Take 1 tablet (40 mg total) by mouth 2 (two) times daily. Marland Kitchen  rOPINIRole (REQUIP) 0.5 MG tablet, Take 1 tablet (0.5 mg total) by mouth at bedtime. Marland Kitchen  zolpidem (AMBIEN) 10 MG tablet, Take 1 tablet (10 mg total) by mouth at bedtime as needed for sleep.   Reviewed prior external information including notes and imaging from  primary care provider As well as notes that were available from care everywhere and other healthcare systems.  Past medical history, social, surgical and family history all reviewed in electronic medical record.  [name] pertanent information unless stated regarding to the chief complaint.   Review of Systems:  [name] headache, visual changes, nausea, vomiting, diarrhea, constipation, dizziness, abdominal pain, skin rash, fevers, chills, night sweats, weight loss, swollen lymph nodes, joint swelling, chest pain, shortness of breath, mood changes. POSITIVE muscle aches, body aches  Objective  Blood  pressure 118/78, pulse 71, height 4\' 11"  (1.499 m), weight 151 lb (68.5 kg), SpO2 98 %.   General: [name] apparent distress alert and oriented x3 mood and affect normal, dressed appropriately.  HEENT: Pupils equal, extraocular movements intact  Respiratory: Patient's speak in full sentences and does not appear short of breath  Cardiovascular: [name] lower extremity edema, non tender, [name] erythema  Neuro: Cranial nerves II through XII are intact, neurovascularly intact in all extremities with 2+ DTRs and 2+ pulses.  Gait normal with good balance and coordination.   Patient's low back exam has severe tenderness to palpation over the sacroiliac joints bilaterally.  With mild positive FABER test.  Patient does have also positive tightness with straight leg test with mild radicular symptoms noted.  5 out of 5 strength though still of the lower extremities.  Possibly mild atrophy of the lower extremities though.      Impression and Recommendations:     This case required medical decision making of moderate complexity. The above documentation has been reviewed and is accurate and complete Lyndal Pulley, DO       Note: This dictation was prepared with Dragon dictation along with smaller phrase technology. Any transcriptional errors that result from this process are unintentional.

## 2020-03-04 NOTE — Assessment & Plan Note (Signed)
Chronic problem : Exacerbation noted.  interventions previously, including medication management: Patient is to continue the Celexa, has tramadol for breakthrough pain, patient wants to avoid muscle relaxers  Interventions this visit: Toradol and Depo-Medrol given.  Prednisone given for burst as well We discussed with patient the importance ergonomics, home exercises, icing regimen, and over-the-counter natural products.   Future considerations but will be based on evaluation and next visit: If continuing to have trouble patient has failed all other conservative therapy need to consider the possibility of advanced imaging    Return to clinic: 3 to 4 weeks

## 2020-03-04 NOTE — Patient Instructions (Signed)
Prednisone for next 5 days, start tomorrow Cocktail today If not better in 2 weeks call us and we will order MRI See me in 8-12 weeks

## 2020-03-05 ENCOUNTER — Ambulatory Visit (INDEPENDENT_AMBULATORY_CARE_PROVIDER_SITE_OTHER): Payer: Medicare PPO | Admitting: Psychology

## 2020-03-05 DIAGNOSIS — F331 Major depressive disorder, recurrent, moderate: Secondary | ICD-10-CM

## 2020-03-12 ENCOUNTER — Ambulatory Visit (INDEPENDENT_AMBULATORY_CARE_PROVIDER_SITE_OTHER): Payer: Medicare PPO | Admitting: Psychology

## 2020-03-12 DIAGNOSIS — F331 Major depressive disorder, recurrent, moderate: Secondary | ICD-10-CM | POA: Diagnosis not present

## 2020-03-19 ENCOUNTER — Ambulatory Visit (INDEPENDENT_AMBULATORY_CARE_PROVIDER_SITE_OTHER): Payer: Medicare PPO | Admitting: Psychology

## 2020-03-19 DIAGNOSIS — F331 Major depressive disorder, recurrent, moderate: Secondary | ICD-10-CM | POA: Diagnosis not present

## 2020-03-21 ENCOUNTER — Other Ambulatory Visit: Payer: Self-pay | Admitting: Internal Medicine

## 2020-03-21 NOTE — Telephone Encounter (Signed)
Please refill as per office routine med refill policy (all routine meds refilled for 3 mo or monthly per pt preference up to one year from last visit, then month to month grace period for 3 mo, then further med refills will have to be denied)  

## 2020-03-26 ENCOUNTER — Other Ambulatory Visit: Payer: Self-pay | Admitting: Internal Medicine

## 2020-03-26 ENCOUNTER — Ambulatory Visit (INDEPENDENT_AMBULATORY_CARE_PROVIDER_SITE_OTHER): Payer: Medicare PPO | Admitting: Psychology

## 2020-03-26 DIAGNOSIS — F411 Generalized anxiety disorder: Secondary | ICD-10-CM

## 2020-03-28 ENCOUNTER — Other Ambulatory Visit: Payer: Self-pay | Admitting: Internal Medicine

## 2020-03-28 ENCOUNTER — Telehealth: Payer: Self-pay

## 2020-03-28 NOTE — Telephone Encounter (Signed)
New message  The patient called and wanted to make an appt with Jodi Mourning.   The patient decided not to make the appt first until the [facility] calls her back.   No details were given.   Please advise.

## 2020-03-28 NOTE — Telephone Encounter (Signed)
Patient scheduled for follow appointment

## 2020-03-28 NOTE — Telephone Encounter (Signed)
Patient scheduled for follow appointment.

## 2020-04-02 ENCOUNTER — Ambulatory Visit (INDEPENDENT_AMBULATORY_CARE_PROVIDER_SITE_OTHER): Payer: Medicare PPO | Admitting: Psychology

## 2020-04-02 DIAGNOSIS — F331 Major depressive disorder, recurrent, moderate: Secondary | ICD-10-CM

## 2020-04-08 ENCOUNTER — Other Ambulatory Visit: Payer: Medicare PPO

## 2020-04-09 ENCOUNTER — Ambulatory Visit (INDEPENDENT_AMBULATORY_CARE_PROVIDER_SITE_OTHER): Payer: Medicare PPO | Admitting: Psychology

## 2020-04-09 ENCOUNTER — Other Ambulatory Visit: Payer: Self-pay | Admitting: Cardiology

## 2020-04-09 DIAGNOSIS — I10 Essential (primary) hypertension: Secondary | ICD-10-CM

## 2020-04-09 DIAGNOSIS — F331 Major depressive disorder, recurrent, moderate: Secondary | ICD-10-CM

## 2020-04-10 ENCOUNTER — Other Ambulatory Visit: Payer: Self-pay | Admitting: Internal Medicine

## 2020-04-16 ENCOUNTER — Other Ambulatory Visit: Payer: Self-pay | Admitting: Internal Medicine

## 2020-04-16 ENCOUNTER — Telehealth: Payer: Self-pay | Admitting: Family Medicine

## 2020-04-16 ENCOUNTER — Other Ambulatory Visit: Payer: Self-pay

## 2020-04-16 DIAGNOSIS — M5416 Radiculopathy, lumbar region: Secondary | ICD-10-CM

## 2020-04-16 NOTE — Telephone Encounter (Signed)
Spoke with patient that MRI is the next step. Patient is now having pain in both legs. Patient will contact insurance to see where she has coverage.

## 2020-04-16 NOTE — Telephone Encounter (Signed)
Please refill as per office routine med refill policy (all routine meds refilled for 3 mo or monthly per pt preference up to one year from last visit, then month to month grace period for 3 mo, then further med refills will have to be denied)  

## 2020-04-16 NOTE — Telephone Encounter (Signed)
Patient called asking for someone to call her to further discuss a possible MRI. She said that Dr Tamala Julian mentioned it in the past but she wanted to find out where to go from here.

## 2020-04-19 ENCOUNTER — Other Ambulatory Visit: Payer: Self-pay

## 2020-04-19 DIAGNOSIS — I6523 Occlusion and stenosis of bilateral carotid arteries: Secondary | ICD-10-CM

## 2020-04-22 ENCOUNTER — Other Ambulatory Visit: Payer: Medicare PPO

## 2020-04-22 ENCOUNTER — Other Ambulatory Visit: Payer: Self-pay

## 2020-04-22 ENCOUNTER — Ambulatory Visit: Payer: Medicare PPO

## 2020-04-22 DIAGNOSIS — I6523 Occlusion and stenosis of bilateral carotid arteries: Secondary | ICD-10-CM

## 2020-04-22 DIAGNOSIS — I739 Peripheral vascular disease, unspecified: Secondary | ICD-10-CM

## 2020-04-23 ENCOUNTER — Ambulatory Visit (INDEPENDENT_AMBULATORY_CARE_PROVIDER_SITE_OTHER): Payer: Medicare PPO | Admitting: Psychology

## 2020-04-23 DIAGNOSIS — F330 Major depressive disorder, recurrent, mild: Secondary | ICD-10-CM | POA: Diagnosis not present

## 2020-04-24 ENCOUNTER — Other Ambulatory Visit: Payer: Self-pay | Admitting: Internal Medicine

## 2020-04-24 NOTE — Telephone Encounter (Signed)
Please refill as per office routine med refill policy (all routine meds refilled for 3 mo or monthly per pt preference up to one year from last visit, then month to month grace period for 3 mo, then further med refills will have to be denied)  

## 2020-04-27 NOTE — Progress Notes (Signed)
Primary Physician/Referring:  Marrian Salvage, FNP  Patient ID: [name] [name], female    DOB: 05-31-1951, 69 y.o.   MRN: [id number]  Chief Complaint  Patient presents with  . Follow-up    3 month  . Hypertension  . Peripheral atery disease   HPI:    [name] [name]  is a 69 y.o. female  with history of left carotid endarterectomy due to symptomatic carotid stenosis in 2012 with TIA, asymptomatic right carotid stenosis, bronchial asthma, hypertension, hyperlipidemia, hyperglycemia presents for 6 mont OV.   She had difficult to control hypertension which is now well controlled on spironolactone, hypokalemia is resolved.  Her main complaint today is back pain due to spinal disease but more importantly she is having severe cramping in her calves even with minimal activity and has gradually gotten worse.  She also has resting night cramps.  She underwent lower extremity arterial duplex on 04/22/2020 and presents for follow-up.    Past Medical History:  Diagnosis Date  . ALCOHOL ABUSE, HX OF 11/06/2007  . ALLERGIC RHINITIS 11/06/2007  . Anxiety 04/03/2011  . ASTHMA 09/13/2007  . Chronic pain syndrome 12/15/2016  . COLONIC POLYPS, HX OF 02/09/2008    ADENOMATOUS POLYP  . Encounter for well adult exam without abnormal findings 04/03/2011  . HYPERLIPIDEMIA 11/03/2010  . HYPERTENSION 09/13/2007  . HYPOTHYROIDISM 11/06/2007  . Impaired glucose tolerance 07/30/2013  . INSOMNIA, HX OF 09/13/2007  . Lumbar degenerative disc disease 12/15/2016  . OSTEOPOROSIS 11/06/2007  . PERIMENOPAUSAL STATUS 09/13/2007  . RLS (restless legs syndrome)   . Stroke ([place])   . TIA (transient ischemic attack) 11/04/2011   Past Surgical History:  Procedure Laterality Date  . BREAST EXCISIONAL BIOPSY     left  . BREAST SURGERY  2008 and 2012   x 2 - benign, left side  . carotid artery surgery Left   . CESAREAN SECTION     x 3  . COLONOSCOPY  multiple   2010   Family History  Problem Relation Age of Onset   . Hyperlipidemia Other   . Hypertension Other   . Coronary artery disease Other   . Breast cancer Cousin   . Hypertension Mother   . Heart disease Mother   . Heart failure Mother   . Dementia Maternal Grandmother   . Colon cancer Neg Hx     Social History   Tobacco Use  . Smoking status: Former Smoker    Packs/day: 0.25    Types: Cigarettes    Quit date: 05/31/1979    Years since quitting: 40.9  . Smokeless tobacco: Never Used  . Tobacco comment: social smoker  Substance Use Topics  . Alcohol use: No    Comment: former alcholic   Marital Status: Divorced  ROS  Review of Systems  Cardiovascular: Positive for claudication, dyspnea on exertion and leg swelling (occasional).  Respiratory: Positive for wheezing (occasional).   Musculoskeletal: Positive for arthritis and back pain.  Gastrointestinal: Negative for melena.  All other systems reviewed and are negative.  Objective  Blood pressure 123/69, pulse 78, temperature 97.8 F (36.6 C), temperature source Temporal, resp. rate 15, height 4\' 11"  (1.499 m), weight 150 lb 12.8 oz (68.4 kg), SpO2 98 %.  Vitals with BMI 04/29/2020 03/04/2020 02/05/2020  Height 4\' 11"  4\' 11"  4\' 11"   Weight 150 lbs 13 oz 151 lbs 149 lbs 10 oz  BMI 30.44 [id number]  Systolic [id number]  Diastolic 69 78 72  Pulse 78  71 74     Physical Exam  Constitutional: She appears well-nourished. No distress.  Short stature  HENT:  Head: Atraumatic.  Cardiovascular: Normal rate, regular rhythm, S1 normal, S2 normal and intact distal pulses. Exam reveals no gallop.  No murmur heard. Pulses:      Carotid pulses are on the right side with bruit.      Popliteal pulses are 0 on the right side and 0 on the left side.       Dorsalis pedis pulses are 1+ on the right side and 1+ on the left side.       Posterior tibial pulses are 0 on the right side and 0 on the left side.  Left carotid endarterectomy scar noted  Pulmonary/Chest: Effort normal and breath sounds  normal.  Abdominal: Soft. Bowel sounds are normal.   Laboratory examination:   Recent Labs    05/10/19 0517 05/10/19 0517 06/09/19 1204 10/16/19 1212 10/17/19 1403  NA 140  --  139 136 136  K 4.4   < > 4.1 4.2 4.6  CL 110   < > 100 102 100  CO2 25   < > 23 27 26   GLUCOSE 101*  --  99 110* 104*  BUN 20  --  12 22 13   CREATININE 0.81   < > 1.00 1.03 0.99  CALCIUM 8.7*   < > 9.6 9.3 9.5  GFRNONAA >60  --  58*  --  59*  GFRAA >60  --  67  --  68   < > = values in this interval not displayed.   CrCl cannot be calculated (Patient's most recent lab result is older than the maximum 21 days allowed.).  CMP Latest Ref Rng & Units 10/17/2019 10/16/2019 06/09/2019  Glucose 65 - 99 mg/dL 104(H) 110(H) 99  BUN 8 - 27 mg/dL 13 22 12   Creatinine 0.57 - 1.00 mg/dL 0.99 1.03 1.00  Sodium 134 - 144 mmol/L 136 136 139  Potassium 3.5 - 5.2 mmol/L 4.6 4.2 4.1  Chloride 96 - 106 mmol/L 100 102 100  CO2 20 - 29 mmol/L 26 27 23   Calcium 8.7 - 10.3 mg/dL 9.5 9.3 9.6  Total Protein 6.0 - 8.5 g/dL 6.3 6.8 -  Total Bilirubin 0.0 - 1.2 mg/dL 0.5 0.4 -  Alkaline Phos 39 - 117 IU/L 105 93 -  AST 0 - 40 IU/L 16 16 -  ALT 0 - 32 IU/L 14 16 -   CBC Latest Ref Rng & Units 05/09/2019 05/01/2019 04/19/2018  WBC 4.0 - 10.5 K/uL 6.3 4.5 5.9  Hemoglobin 12.0 - 15.0 g/dL 12.9 13.2 13.7  Hematocrit 36.0 - 46.0 % 39.1 38.8 41.2  Platelets 150 - 400 K/uL 216 198.0 222.0   Lipid Panel     Component Value Date/Time   CHOL 144 10/17/2019 1403   TRIG 48 10/17/2019 1403   TRIG 50 10/29/2006 1025   HDL 64 10/17/2019 1403   CHOLHDL 3 05/01/2019 1312   VLDL 14.8 05/01/2019 1312   LDLCALC 69 10/17/2019 1403   LDLDIRECT 72 10/17/2019 1403   LDLDIRECT 127.1 10/24/2010 0910   HEMOGLOBIN A1C Lab Results  Component Value Date   HGBA1C 6.4 10/16/2019   MPG 128 (H) 03/04/2011   TSH Recent Labs    05/01/19 1312 10/16/19 1212  TSH 1.32 0.95   Medications and allergies   Allergies  Allergen Reactions  . Fosamax  [Alendronate Sodium] Nausea And Vomiting  . Penicillins Hives    Did  it involve swelling of the face/tongue/throat, SOB, or low BP? No Did it involve sudden or severe rash/hives, skin peeling, or any reaction on the inside of your mouth or nose? Yes Did you need to seek medical attention at a hospital or doctor's office? Yes When did it last happen?Over 10 years ago If all above answers are "NO", may proceed with cephalosporin use.     Current Outpatient Medications  Medication Instructions  . amLODipine (NORVASC) 5 mg, Oral, Daily after supper  . atorvastatin (LIPITOR) 40 MG tablet TAKE 1 TABLET BY MOUTH DAILY. ANNUAL APPT DUE IN MAY MUST SEE PROVIDER FOR FUTURE REFILLS  . benazepril-hydrochlorthiazide (LOTENSIN HCT) 10-12.5 MG tablet 1 tablet, Oral, Daily, Annual appt due in May must see provider for future refills  . cilostazol (PLETAL) 100 mg, Oral, 2 times daily  . citalopram (CELEXA) 20 mg, Oral, Daily  . diphenhydrAMINE-APAP, sleep, (TYLENOL PM EXTRA STRENGTH PO) Oral, Daily at bedtime  . ezetimibe (ZETIA) 10 mg, Oral, Daily after supper  . fluticasone furoate-vilanterol (BREO ELLIPTA) 100-25 MCG/INH AEPB 1 puff, Inhalation, Daily  . levothyroxine (SYNTHROID) 75 mcg, Oral, Daily before breakfast  . metoprolol succinate (TOPROL-XL) 25 MG 24 hr tablet TAKE 1 TABLET BY MOUTH EVERY DAY  . montelukast (SINGULAIR) 10 MG tablet TAKE 1 TABLET BY MOUTH EVERY DAY  . pantoprazole (PROTONIX) 40 mg, Oral, 2 times daily  . predniSONE (DELTASONE) 50 MG tablet Take one tablet daily for the next 5 days.  Marland Kitchen rOPINIRole (REQUIP) 0.5 mg, Oral, Daily at bedtime  . spironolactone (ALDACTONE) 25 MG tablet TAKE 1 TABLET BY MOUTH EVERY DAY IN THE MORNING  . traMADol (ULTRAM) 50 mg, Oral, Every 8 hours PRN  . zolpidem (AMBIEN) 10 MG tablet TAKE 1 TABLET BY MOUTH AT BEDTIME AS NEEDED FOR SLEEP.   Radiology:   No results found.  Cardiac Studies:   Echocardiogram [id number]:  Normal systolic function,  EF [id number].  Mild LVH.  Grade 2 diastolic dysfunction.  Moderately dilated left atrium. Mild thickening of mitral valve with mild calcification.  Mild aortic valve weakening and calcification.  Event Monitor for 14 days Start date 06/02/2019:  Predominant rhythm is normal sinus rhythm.:  Symptomatic transmissions of flutter and chest pain revealed normal sinus rhythm.  Automatic detected event revealed one episode of 12 beat (8 Sec) wide complex tachycardia suggestive of NSVT at 116 bpm at 2:30 PM on 06/10/2019.  Lexiscan Myoview Stress Test 06/26/2019: Resting EKG demonstrates sinus rhythm with short PR interval, borderline criteria for LVH.   stress EKG is nondiagnostic for ischemia as his of alcoholic stress test. Symptoms during test were SOB and Dizziness.  Myocardial pefusion imaging is normal. Left ventricular ejection fraction is  67% with normal wall motion. Low risk study.  Carotid artery duplex  07/07/2019: Stenosis in the right internal carotid artery (16-49%). Minimal stenosis in the left internal carotid artery (minimal). Patent left endarterectomy site.  Antegrade right vertebral artery flow. Antegrade left vertebral artery flow. Follow up in one year is appropriate if clinically indicated.  Lower Extremity Arterial Duplex 04/22/2020:  Right mid SFA has monophasic waveform suggests diffuse disease and may  indicate hemodynamically significant stenosis.  No hemodynamically significant stenosis is identified in the left lower  extremity arterial system.  This exam reveals mildly decreased perfusion of the right lower extremity,  noted at the dorsalis pedis and post tibial artery level (ABI 0.93) and  mildly decreased perfusion of the left lower extremity, noted at the  dorsalis pedis  and post tibial artery level (ABI 0.93).   EKG  02/05/20: Sinus Rhythm. Nonspecific T-abnormality. Abnormal.  05/31/2019: Sinus rhythm with short PR interval, PR interval 116 ms.  Possible left atrial  abnormality.  No evidence of ischemia, normal QT interval.  Assessment     ICD-10-CM   1. Claudication in peripheral vascular disease (HCC)  I73.9 cilostazol (PLETAL) 100 MG tablet  2. Essential hypertension  I10     Meds ordered this encounter  Medications  . cilostazol (PLETAL) 100 MG tablet    Sig: Take 1 tablet (100 mg total) by mouth 2 (two) times daily.    Dispense:  60 tablet    Refill:  3    Discontinue Plavix    Medications Discontinued During This Encounter  Medication Reason  . clopidogrel (PLAVIX) 75 MG tablet Change in therapy    Recommendations:   [name] [name]  is a 69 y.o. A[facility]patient  with history of left carotid endarterectomy due to symptomatic carotid stenosis in 2012 with TIA, asymptomatic right carotid stenosis, bronchial asthma, hypertension, hyperlipidemia, hyperglycemia presents for follow-up of claudication.  I reviewed her duplex, she does have probably significant stenosis in the right SFA but her symptoms are bilateral, a competent of neuropathic pain is potential.  I will discontinue Plavix and switch her to Pletal and see if her symptoms would improve.  If it does not improve and it is lifestyle limiting, then I can consider peripheral arteriogram.  I will see her back in 3 months for follow-up.  No bleeding diathesis on aspirin and Plavix so far.  Otherwise her risk factors are well controlled including hypertension hyperlipidemia. She may also benefit from being on Neurontin due to neuropathy and back pain and spinal stenosis. I have sent a note with her to her PCP just in case my notes do not make it to her in time as she is seeing her this afternoon.   Adrian Prows, MD, [facility] 04/29/2020, 12:00 PM [facility] [place] Pager: [phone] Office: [phone]

## 2020-04-29 ENCOUNTER — Other Ambulatory Visit: Payer: Self-pay | Admitting: Cardiology

## 2020-04-29 ENCOUNTER — Encounter: Payer: Self-pay | Admitting: Family

## 2020-04-29 ENCOUNTER — Encounter: Payer: Self-pay | Admitting: Cardiology

## 2020-04-29 ENCOUNTER — Ambulatory Visit: Payer: Medicare PPO | Admitting: Family

## 2020-04-29 ENCOUNTER — Ambulatory Visit: Payer: Medicare PPO | Admitting: Cardiology

## 2020-04-29 ENCOUNTER — Other Ambulatory Visit: Payer: Self-pay

## 2020-04-29 VITALS — BP 123/69 | HR 78 | Temp 97.8°F | Resp 15 | Ht 59.0 in | Wt 150.8 lb

## 2020-04-29 VITALS — BP 116/70 | HR 70 | Temp 98.0°F | Ht 59.0 in | Wt 150.0 lb

## 2020-04-29 DIAGNOSIS — I6523 Occlusion and stenosis of bilateral carotid arteries: Secondary | ICD-10-CM

## 2020-04-29 DIAGNOSIS — E039 Hypothyroidism, unspecified: Secondary | ICD-10-CM

## 2020-04-29 DIAGNOSIS — G4701 Insomnia due to medical condition: Secondary | ICD-10-CM

## 2020-04-29 DIAGNOSIS — E785 Hyperlipidemia, unspecified: Secondary | ICD-10-CM

## 2020-04-29 DIAGNOSIS — G894 Chronic pain syndrome: Secondary | ICD-10-CM | POA: Diagnosis not present

## 2020-04-29 DIAGNOSIS — M5136 Other intervertebral disc degeneration, lumbar region: Secondary | ICD-10-CM

## 2020-04-29 DIAGNOSIS — I739 Peripheral vascular disease, unspecified: Secondary | ICD-10-CM

## 2020-04-29 DIAGNOSIS — I10 Essential (primary) hypertension: Secondary | ICD-10-CM | POA: Diagnosis not present

## 2020-04-29 MED ORDER — CILOSTAZOL 100 MG PO TABS: 100.0000 mg | ORAL_TABLET | Freq: Two times a day (BID) | ORAL | 3 refills | Status: DC

## 2020-04-29 MED ORDER — GABAPENTIN 100 MG PO CAPS
200.0000 mg | ORAL_CAPSULE | Freq: Every day | ORAL | 1 refills | Status: DC
Start: 2020-04-29 — End: 2020-07-10

## 2020-04-29 MED ORDER — PANTOPRAZOLE SODIUM 40 MG PO TBEC: 40.0000 mg | DELAYED_RELEASE_TABLET | Freq: Two times a day (BID) | ORAL | 1 refills | Status: DC

## 2020-04-29 NOTE — Progress Notes (Signed)
[name] [name] is a 69 y.o. female with the following history as recorded in EpicCare:  Patient Active Problem List   Diagnosis Date Noted  . Sacroiliac pain 08/10/2019  . Anxiety and depression 05/11/2019  . Chest pain 05/09/2019  . Arthritis of sacroiliac joint of both sides 05/01/2019  . Polyarthralgia 12/28/2018  . Preventative health care 10/20/2018  . RLS (restless legs syndrome) 09/27/2018  . Insomnia disorder related to known organic factor 06/27/2018  . PLMD (periodic limb movement disorder) 06/27/2018  . Hot flashes due to menopause 04/25/2018  . Palpitations 04/25/2018  . Diaphoresis 04/25/2018  . Long term current use of antithrombotics/antiplatelets 04/19/2018  . Insomnia 04/19/2018  . Intractable episodic headache 02/28/2018  . Numbness and tingling of right arm 02/28/2018  . Toe pain, right 10/19/2017  . Rotator cuff arthropathy of left shoulder 06/02/2017  . Trigger thumb of left hand 06/02/2017  . Lipoma 05/06/2017  . Pain of left thumb 05/06/2017  . Chronic pain syndrome 12/15/2016  . Lumbar degenerative disc disease 12/15/2016  . Left leg pain 01/28/2016  . Left shoulder pain 04/09/2015  . Impaired glucose tolerance 07/30/2013  . Abnormal breath sounds 07/30/2013  . Left carotid stenosis 05/05/2012  . Anxiety 04/03/2011  . CVA (cerebral infarction) 03/15/2011  . HLD (hyperlipidemia) 11/03/2010  . Hx of adenomatous colonic polyps 02/09/2008  . Hypothyroidism 11/06/2007  . ALLERGIC RHINITIS 11/06/2007  . OSTEOPOROSIS 11/06/2007  . ALCOHOL ABUSE, HX OF 11/06/2007  . Essential hypertension 09/13/2007  . Asthma 09/13/2007  . PERIMENOPAUSAL STATUS 09/13/2007  . Chronic insomnia 09/13/2007    Current Outpatient Medications  Medication Sig Dispense Refill  . amLODipine (NORVASC) 5 MG tablet TAKE 1 TABLET (5 MG TOTAL) BY MOUTH DAILY AFTER SUPPER. 90 tablet 0  . atorvastatin (LIPITOR) 40 MG tablet TAKE 1 TABLET BY MOUTH DAILY. ANNUAL APPT DUE IN MAY MUST SEE  PROVIDER FOR FUTURE REFILLS 90 tablet 1  . benazepril-hydrochlorthiazide (LOTENSIN HCT) 10-12.5 MG tablet TAKE 1 TABLET BY MOUTH DAILY. ANNUAL APPT DUE IN MAY MUST SEE PROVIDER FOR FUTURE REFILLS 30 tablet 0  . cilostazol (PLETAL) 100 MG tablet Take 1 tablet (100 mg total) by mouth 2 (two) times daily. 60 tablet 3  . citalopram (CELEXA) 20 MG tablet Take 1 tablet (20 mg total) by mouth daily. 90 tablet 3  . diphenhydrAMINE-APAP, sleep, (TYLENOL PM EXTRA STRENGTH PO) Take by mouth at bedtime.    Marland Kitchen ezetimibe (ZETIA) 10 MG tablet TAKE 1 TABLET (10 MG TOTAL) BY MOUTH DAILY AFTER SUPPER. 90 tablet 3  . fluticasone furoate-vilanterol (BREO ELLIPTA) 100-25 MCG/INH AEPB Inhale 1 puff into the lungs daily. 28 each 2  . levothyroxine (SYNTHROID) 75 MCG tablet TAKE 1 TABLET (75 MCG TOTAL) BY MOUTH DAILY BEFORE BREAKFAST. 90 tablet 2  . metoprolol succinate (TOPROL-XL) 25 MG 24 hr tablet TAKE 1 TABLET BY MOUTH EVERY DAY 90 tablet 3  . montelukast (SINGULAIR) 10 MG tablet TAKE 1 TABLET BY MOUTH EVERY DAY 90 tablet 3  . pantoprazole (PROTONIX) 40 MG tablet Take 1 tablet (40 mg total) by mouth 2 (two) times daily. 180 tablet 1  . rOPINIRole (REQUIP) 0.5 MG tablet Take 1 tablet (0.5 mg total) by mouth at bedtime. 90 tablet 3  . spironolactone (ALDACTONE) 25 MG tablet TAKE 1 TABLET BY MOUTH EVERY DAY IN THE MORNING 90 tablet 0  . zolpidem (AMBIEN) 10 MG tablet TAKE 1 TABLET BY MOUTH AT BEDTIME AS NEEDED FOR SLEEP. 90 tablet 1  . gabapentin (NEURONTIN)  100 MG capsule Take 2 capsules (200 mg total) by mouth at bedtime. 60 capsule 1   No current facility-administered medications for this visit.    Allergies: Fosamax [alendronate sodium] and Penicillins  Past Medical History:  Diagnosis Date  . ALCOHOL ABUSE, HX OF 11/06/2007  . ALLERGIC RHINITIS 11/06/2007  . Anxiety 04/03/2011  . ASTHMA 09/13/2007  . Chronic pain syndrome 12/15/2016  . COLONIC POLYPS, HX OF 02/09/2008    ADENOMATOUS POLYP  . Encounter for well  adult exam without abnormal findings 04/03/2011  . HYPERLIPIDEMIA 11/03/2010  . HYPERTENSION 09/13/2007  . HYPOTHYROIDISM 11/06/2007  . Impaired glucose tolerance 07/30/2013  . INSOMNIA, HX OF 09/13/2007  . Lumbar degenerative disc disease 12/15/2016  . OSTEOPOROSIS 11/06/2007  . PERIMENOPAUSAL STATUS 09/13/2007  . RLS (restless legs syndrome)   . Stroke ([place])   . TIA (transient ischemic attack) 11/04/2011    Past Surgical History:  Procedure Laterality Date  . BREAST EXCISIONAL BIOPSY     left  . BREAST SURGERY  2008 and 2012   x 2 - benign, left side  . carotid artery surgery Left   . CESAREAN SECTION     x 3  . COLONOSCOPY  multiple   2010    Family History  Problem Relation Age of Onset  . Hyperlipidemia Other   . Hypertension Other   . Coronary artery disease Other   . Breast cancer Cousin   . Hypertension Mother   . Heart disease Mother   . Heart failure Mother   . Dementia Maternal Grandmother   . Colon cancer Neg Hx     Social History   Tobacco Use  . Smoking status: Former Smoker    Packs/day: 0.25    Types: Cigarettes    Quit date: 05/31/1979    Years since quitting: 40.9  . Smokeless tobacco: Never Used  . Tobacco comment: social smoker  Substance Use Topics  . Alcohol use: No    Comment: former alcholic    Subjective:  6 month follow up on chronic care needs; having increased problems with leg pain/ suspected neuropathy; has MRI scheduled of her lumbar spine on 05/15/2020; saw her cardiologist today who is starting Pletal to help with suspected claudication; he asks that we consider trial of Neurontin to help as well; HTN, hyperlipidemia- managed by cardiology; Hypothyroidism- stable; last TSH checked in November 2020;  GERD- chronic cough responded well to use of Protonix; does need refills today;   Objective:  Vitals:   04/29/20 1326  BP: 116/70  Pulse: 70  Temp: 98 F (36.7 C)  TempSrc: Oral  SpO2: 97%  Weight: 150 lb (68 kg)  Height: 4\' 11"   (1.499 m)    General: Well developed, well nourished, in no acute distress  Skin : Warm and dry.  Head: Normocephalic and atraumatic  Lungs: Respirations unlabored; clear to auscultation bilaterally without wheeze, rales, rhonchi  CVS exam: normal rate and regular rhythm.  Musculoskeletal: No deformities; no active joint inflammation  Extremities: No edema, cyanosis, clubbing  Vessels: Symmetric bilaterally  Neurologic: Alert and oriented; speech intact; face symmetrical; moves all extremities well; CNII-XII intact without focal deficit   Assessment:  1. Lumbar degenerative disc disease   2. Chronic pain syndrome   3. Essential hypertension   4. Hyperlipidemia, unspecified hyperlipidemia type   5. Hypothyroidism, unspecified type   6. Insomnia due to medical condition     Plan:  1. & 2. Keep planned follow-up for MRI; see Dr. Tamala Julian as  scheduled to review and discuss; trial of Gabapentin 200 mg qhs; 3. Stable; continue with cardiology; 4. Stable; continue with cardiology; 5. Plan to check TSH in November 2021; 6. Patient is encouraged to try and hold Tylenol PM and see if gabapentin will be sufficient to help her sleep.  Plan to follow up in 6 months, sooner prn.  This visit occurred during the SARS-CoV-2 public health emergency.  Safety protocols were in place, including screening questions prior to the visit, additional usage of staff PPE, and extensive cleaning of exam room while observing appropriate contact time as indicated for disinfecting solutions.      No follow-ups on file.  No orders of the defined types were placed in this encounter.   Requested Prescriptions   Signed Prescriptions Disp Refills  . pantoprazole (PROTONIX) 40 MG tablet 180 tablet 1    Sig: Take 1 tablet (40 mg total) by mouth 2 (two) times daily.  Marland Kitchen gabapentin (NEURONTIN) 100 MG capsule 60 capsule 1    Sig: Take 2 capsules (200 mg total) by mouth at bedtime.

## 2020-04-29 NOTE — Patient Instructions (Signed)
Stop Clopidogril. Start Cilastazol 100 gm twice daily

## 2020-04-30 ENCOUNTER — Ambulatory Visit: Payer: Medicare PPO | Admitting: Psychology

## 2020-05-02 ENCOUNTER — Other Ambulatory Visit: Payer: Self-pay | Admitting: Cardiology

## 2020-05-02 DIAGNOSIS — I6523 Occlusion and stenosis of bilateral carotid arteries: Secondary | ICD-10-CM

## 2020-05-02 DIAGNOSIS — Z9889 Other specified postprocedural states: Secondary | ICD-10-CM

## 2020-05-07 ENCOUNTER — Ambulatory Visit: Payer: Medicare PPO | Admitting: Psychology

## 2020-05-13 ENCOUNTER — Other Ambulatory Visit: Payer: Self-pay | Admitting: Internal Medicine

## 2020-05-13 NOTE — Telephone Encounter (Signed)
Please refill as per office routine med refill policy (all routine meds refilled for 3 mo or monthly per pt preference up to one year from last visit, then month to month grace period for 3 mo, then further med refills will have to be denied)  

## 2020-05-14 ENCOUNTER — Other Ambulatory Visit: Payer: Self-pay

## 2020-05-14 ENCOUNTER — Ambulatory Visit: Payer: Medicare PPO | Admitting: Psychology

## 2020-05-14 ENCOUNTER — Ambulatory Visit: Payer: Medicare PPO | Admitting: Family Medicine

## 2020-05-14 ENCOUNTER — Ambulatory Visit
Admission: RE | Admit: 2020-05-14 | Discharge: 2020-05-14 | Disposition: A | Discharge status: Home / Self Care | Payer: Medicare PPO | Source: Ambulatory Visit | Attending: Family Medicine | Admitting: Family Medicine

## 2020-05-14 DIAGNOSIS — M5416 Radiculopathy, lumbar region: Secondary | ICD-10-CM

## 2020-05-15 ENCOUNTER — Other Ambulatory Visit: Payer: Medicare PPO

## 2020-05-16 ENCOUNTER — Other Ambulatory Visit: Payer: Self-pay

## 2020-05-16 ENCOUNTER — Ambulatory Visit (INDEPENDENT_AMBULATORY_CARE_PROVIDER_SITE_OTHER): Payer: Medicare PPO | Admitting: Family Medicine

## 2020-05-16 ENCOUNTER — Encounter: Payer: Self-pay | Admitting: Family Medicine

## 2020-05-16 VITALS — BP 134/70 | HR 77 | Ht 59.0 in | Wt 154.0 lb

## 2020-05-16 DIAGNOSIS — M5136 Other intervertebral disc degeneration, lumbar region: Secondary | ICD-10-CM

## 2020-05-16 DIAGNOSIS — M48061 Spinal stenosis, lumbar region without neurogenic claudication: Secondary | ICD-10-CM | POA: Diagnosis not present

## 2020-05-16 MED ORDER — GABAPENTIN 300 MG PO CAPS
300.0000 mg | ORAL_CAPSULE | Freq: Every day | ORAL | 0 refills | Status: DC
Start: 2020-05-16 — End: 2020-06-24

## 2020-05-16 NOTE — Patient Instructions (Addendum)
Gabapentin 300mg  at night [facility] We are here for questions

## 2020-05-16 NOTE — Assessment & Plan Note (Signed)
Severe degenerative spinal stenosis with neurogenic claudication.  Discussed with patient at great length about icing regimen and home exercise, which activities to do which wants to avoid.  Patient is to increase activity slowly over the course the next several weeks.  Patient is to increase activity very slowly on his own but I do feel at this point she has failed all other conservative therapy and secondary to the radicular symptoms and the quality of life patient will be referred today for further evaluation with neurosurgery for potential surgical intervention.Unable to truly do the epidural secondary to being on a blood thinner recently for a deep venous thrombosis.

## 2020-05-16 NOTE — Progress Notes (Signed)
[address] [place] [address] [place] Phone: [phone] Subjective:   I [name] [name] am serving as a Education administrator for Dr. Hulan Saas.  This visit occurred during the SARS-CoV-2 public health emergency.  Safety protocols were in place, including screening questions prior to the visit, additional usage of staff PPE, and extensive cleaning of exam room while observing appropriate contact time as indicated for disinfecting solutions.   I'm seeing this patient by the request  of:  Marrian Salvage, FNP  CC: Back pain, weight  [id number]   03/04/2020 Chronic problem : Exacerbation noted.  interventions previously, including medication management: Patient is to continue the Celexa, has tramadol for breakthrough pain, patient wants to avoid muscle relaxers  Interventions this visit: Toradol and Depo-Medrol given.  Prednisone given for burst as well We discussed with patient the importance ergonomics, home exercises, icing regimen, and over-the-counter natural products.   Future considerations but will be based on evaluation and next visit: If continuing to have trouble patient has failed all other conservative therapy need to consider the possibility of advanced imaging    Return to clinic: 3 to 4 weeks  Update 05/16/2020 [name] [name] is a 69 y.o. female coming in with complaint of back pain. Patient states that she is in pain all the time. Tried CBD oil that did not help. Patient states she is tired of hurting.  Was sent for an MRI   Mri independently visualized by me.   IMPRESSION: 1. Progressive lumbar disc and facet degeneration, most notable at L4-5 where there is slightly increased anterolisthesis, moderate to severe spinal stenosis, and moderate right neural foraminal stenosis. 2. New right foraminal disc protrusion at L5-S1 with moderate right neural foraminal stenosis.  Past Medical History:  Diagnosis Date  .  ALCOHOL ABUSE, HX OF 11/06/2007  . ALLERGIC RHINITIS 11/06/2007  . Anxiety 04/03/2011  . ASTHMA 09/13/2007  . Chronic pain syndrome 12/15/2016  . COLONIC POLYPS, HX OF 02/09/2008    ADENOMATOUS POLYP  . Encounter for well adult exam without abnormal findings 04/03/2011  . HYPERLIPIDEMIA 11/03/2010  . HYPERTENSION 09/13/2007  . HYPOTHYROIDISM 11/06/2007  . Impaired glucose tolerance 07/30/2013  . INSOMNIA, HX OF 09/13/2007  . Lumbar degenerative disc disease 12/15/2016  . OSTEOPOROSIS 11/06/2007  . PERIMENOPAUSAL STATUS 09/13/2007  . RLS (restless legs syndrome)   . Stroke ([place])   . TIA (transient ischemic attack) 11/04/2011   Past Surgical History:  Procedure Laterality Date  . BREAST EXCISIONAL BIOPSY     left  . BREAST SURGERY  2008 and 2012   x 2 - benign, left side  . carotid artery surgery Left   . CESAREAN SECTION     x 3  . COLONOSCOPY  multiple   2010   Social History   Socioeconomic History  . Marital status: Divorced    Spouse name: Not on file  . Number of children: 3  . Years of education: Not on file  . Highest education level: Not on file  Occupational History  . Occupation: Nurse, mental health: [employer]  Tobacco Use  . Smoking status: Former Smoker    Packs/day: 0.25    Types: Cigarettes    Quit date: 05/31/1979    Years since quitting: 40.9  . Smokeless tobacco: Never Used  . Tobacco comment: social smoker  Substance and Sexual Activity  . Alcohol use: No    Comment: former alcholic  . Drug use:  No  . Sexual activity: Not on file  Other Topics Concern  . 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 Difficulty of Paying Living Expenses:   Food Insecurity:   . Worried About Charity fundraiser in the Last Year:   . Academic librarian in the Last Year:   Transportation Needs:   . Film/video editor (Medical):   Marland Kitchen Lack of Transportation (Non-Medical):   Physical Activity:   . Days of Exercise per Week:   . Minutes of Exercise per Session:   Stress:   . Feeling of Stress :   Social Connections:   . Frequency of Communication with Friends and Family:   . Frequency of Social Gatherings with Friends and Family:   . Attends Religious Services:   . Active Member of Clubs or Organizations:   . Attends Archivist Meetings:   Marland Kitchen Marital Status:    Allergies  Allergen Reactions  . Fosamax [Alendronate Sodium] Nausea And Vomiting  . Penicillins Hives    Did it involve swelling of the face/tongue/throat, SOB, or low BP? No Did it involve sudden or severe rash/hives, skin peeling, or any reaction on the inside of your mouth or nose? Yes Did you need to seek medical attention at a hospital or doctor's office? Yes When did it last happen?Over 10 years ago If all above answers are "NO", may proceed with cephalosporin use.   Family History  Problem Relation Age of Onset  . Hyperlipidemia Other   . Hypertension Other   . Coronary artery disease Other   . Breast cancer Cousin   . Hypertension Mother   . Heart disease Mother   . Heart failure Mother   . Dementia Maternal Grandmother   . Colon cancer Neg Hx     Current Outpatient Medications (Endocrine & Metabolic):  .  levothyroxine (SYNTHROID) 75 MCG tablet, TAKE 1 TABLET (75 MCG TOTAL) BY MOUTH DAILY BEFORE BREAKFAST.  Current Outpatient Medications (Cardiovascular):  .  amLODipine (NORVASC) 5 MG tablet, TAKE 1 TABLET (5 MG TOTAL) BY MOUTH DAILY AFTER SUPPER. Marland Kitchen  atorvastatin (LIPITOR) 40 MG tablet, TAKE 1 TABLET BY MOUTH DAILY. ANNUAL APPT DUE IN MAY MUST SEE PROVIDER FOR FUTURE REFILLS .  benazepril-hydrochlorthiazide (LOTENSIN HCT) 10-12.5 MG tablet, Take 1 tablet by mouth daily. Marland Kitchen  ezetimibe (ZETIA) 10 MG tablet, TAKE 1 TABLET (10 MG TOTAL) BY  MOUTH DAILY AFTER SUPPER. .  metoprolol succinate (TOPROL-XL) 25 MG 24 hr tablet, TAKE 1 TABLET BY MOUTH EVERY DAY .  spironolactone (ALDACTONE) 25 MG tablet, TAKE 1 TABLET BY MOUTH EVERY DAY IN THE MORNING  Current Outpatient Medications (Respiratory):  .  fluticasone furoate-vilanterol (BREO ELLIPTA) 100-25 MCG/INH AEPB, Inhale 1 puff into the lungs daily. .  montelukast (SINGULAIR) 10 MG tablet, TAKE 1 TABLET BY MOUTH EVERY DAY   Current Outpatient Medications (Hematological):  .  cilostazol (PLETAL) 100 MG tablet, Take 1 tablet (100 mg total) by mouth 2 (two) times daily.  Current Outpatient Medications (Other):  .  citalopram (CELEXA) 20 MG tablet, Take 1 tablet (20 mg total) by mouth daily. .  diphenhydrAMINE-APAP, sleep, (TYLENOL PM EXTRA STRENGTH PO), Take by mouth at bedtime. .  gabapentin (NEURONTIN) 100 MG capsule, Take  2 capsules (200 mg total) by mouth at bedtime. .  pantoprazole (PROTONIX) 40 MG tablet, Take 1 tablet (40 mg total) by mouth 2 (two) times daily. Marland Kitchen  rOPINIRole (REQUIP) 0.5 MG tablet, Take 1 tablet (0.5 mg total) by mouth at bedtime. Marland Kitchen  zolpidem (AMBIEN) 10 MG tablet, TAKE 1 TABLET BY MOUTH AT BEDTIME AS NEEDED FOR SLEEP. Marland Kitchen  gabapentin (NEURONTIN) 300 MG capsule, Take 1 capsule (300 mg total) by mouth at bedtime.   Reviewed prior external information including notes and imaging from  primary care provider As well as notes that were available from care everywhere and other healthcare systems.  Past medical history, social, surgical and family history all reviewed in electronic medical record.  No pertanent information unless stated regarding to the chief complaint.   Review of Systems:  No headache, visual changes, nausea, vomiting, diarrhea, constipation, dizziness, abdominal pain, skin rash, fevers, chills, night sweats, weight loss, swollen lymph nodes,  chest pain, shortness of breath, mood changes. POSITIVE muscle aches, body aches, joint  swelling  Objective  Blood pressure 134/70, pulse 77, height 4\' 11"  (1.499 m), weight 154 lb (69.9 kg), SpO2 96 %.   General: No apparent distress alert and oriented x3 mood and affect normal, dressed appropriately.  HEENT: Pupils equal, extraocular movements intact  Respiratory: Patient's speak in full sentences and does not appear short of breath  Cardiovascular: No lower extremity edema, non tender, no erythema  Neuro: 1+ Achilles on the left side Gait severely antalgic with no noted weakness of the lower extremity Severe loss of lordosis of the lumbar spine.  Significant tightness with some radicular symptoms left greater than right at 20 degrees of forward flexion.  Tightness in the FABER test left greater than right.  Deep tendon reflexes once again Achilles 1+ on the left   Impression and Recommendations:     The above documentation has been reviewed and is accurate and complete Lyndal Pulley, DO  Total time with patient today discussing the MRI as well as a phone call with her daughter and going over her chart beforehand on the same date of the office visit 42 minutes     Note: This dictation was prepared with Dragon dictation along with smaller phrase technology. Any transcriptional errors that result from this process are unintentional.

## 2020-05-21 ENCOUNTER — Ambulatory Visit (INDEPENDENT_AMBULATORY_CARE_PROVIDER_SITE_OTHER): Payer: Medicare PPO | Admitting: Psychology

## 2020-05-21 DIAGNOSIS — F330 Major depressive disorder, recurrent, mild: Secondary | ICD-10-CM | POA: Diagnosis not present

## 2020-05-23 ENCOUNTER — Telehealth: Payer: Self-pay | Admitting: Family Medicine

## 2020-05-23 ENCOUNTER — Other Ambulatory Visit: Payer: Self-pay

## 2020-05-23 DIAGNOSIS — M5136 Other intervertebral disc degeneration, lumbar region: Secondary | ICD-10-CM

## 2020-05-23 NOTE — Telephone Encounter (Signed)
Patient called stating that she is in a lot of pain. Is there anything Dr Tamala Julian could recommend for her to do int he meantime? She was referred to [facility] but would like to know if there is somewhere else we could refer her to? She has not been happy with the way they have treated her just over the phone.

## 2020-05-23 NOTE — Telephone Encounter (Signed)
Spoke with patient providing her with Dr. Laurena Bering name.

## 2020-05-28 ENCOUNTER — Ambulatory Visit (INDEPENDENT_AMBULATORY_CARE_PROVIDER_SITE_OTHER): Payer: Medicare PPO | Admitting: Psychology

## 2020-05-28 DIAGNOSIS — F330 Major depressive disorder, recurrent, mild: Secondary | ICD-10-CM

## 2020-06-04 ENCOUNTER — Ambulatory Visit (INDEPENDENT_AMBULATORY_CARE_PROVIDER_SITE_OTHER): Payer: Medicare PPO | Admitting: Psychology

## 2020-06-04 DIAGNOSIS — F330 Major depressive disorder, recurrent, mild: Secondary | ICD-10-CM | POA: Diagnosis not present

## 2020-06-11 ENCOUNTER — Ambulatory Visit (INDEPENDENT_AMBULATORY_CARE_PROVIDER_SITE_OTHER): Payer: Medicare PPO | Admitting: Psychology

## 2020-06-11 DIAGNOSIS — F330 Major depressive disorder, recurrent, mild: Secondary | ICD-10-CM

## 2020-06-18 ENCOUNTER — Ambulatory Visit: Payer: Medicare PPO | Admitting: Psychology

## 2020-06-18 ENCOUNTER — Other Ambulatory Visit: Payer: Self-pay | Admitting: Orthopedic Surgery

## 2020-06-18 ENCOUNTER — Encounter: Payer: Self-pay | Admitting: Cardiology

## 2020-06-21 ENCOUNTER — Telehealth: Payer: Self-pay | Admitting: Family Medicine

## 2020-06-21 NOTE — Telephone Encounter (Signed)
Patient called asking if you would be willing to prescribe something stronger than Gabapentin until she has her surgery.  Please advise.

## 2020-06-24 MED ORDER — GABAPENTIN 400 MG PO CAPS: 400.0000 mg | ORAL_CAPSULE | Freq: Every day | ORAL | 1 refills | Status: DC

## 2020-06-24 NOTE — Telephone Encounter (Signed)
Called patient and gave MD response. Agrees with suggested change and would like the increased Gabapentin sent in please.

## 2020-06-24 NOTE — Telephone Encounter (Signed)
Difficult due to patient other medications.  I am concern that it could change patient surgery date so would say to schedule tylenol 3 times a day can can up her gababpentin at night to 400mg  by doing the 300mg  and 100mg  together

## 2020-06-25 ENCOUNTER — Ambulatory Visit: Payer: Medicare PPO | Admitting: Psychology

## 2020-06-27 ENCOUNTER — Telehealth: Payer: Self-pay

## 2020-06-27 NOTE — Telephone Encounter (Signed)
New message    Checking on the cardiac clearance fax over 7.8.2021 @ 8:24am    Upcoming surgery 7.28.2021

## 2020-06-28 NOTE — Telephone Encounter (Signed)
Lucy, I gave the form to you earlier this week. I am sure you faxed it- is there anyway to find a copy?

## 2020-07-01 NOTE — Telephone Encounter (Signed)
Form was faxed on 7/14. Will refaxed to [place]...Johny Chess

## 2020-07-02 ENCOUNTER — Ambulatory Visit: Payer: Medicare PPO | Admitting: Psychology

## 2020-07-03 NOTE — Progress Notes (Signed)
Your procedure is scheduled on Wednesday July 28.  Report to [facility] Main Entrance "A" at 05:30 A.M., and check in at the Admitting office.  Call this number if you have problems the morning of surgery: [phone]  Call [phone] if you have any questions prior to your surgery date Monday-Friday 8am-4pm   Remember: Do not eat or drink after midnight the night before your surgery    Take these medicines the morning of surgery with A SIP OF WATER: atorvastatin (LIPITOR) cilostazol (PLETAL)  citalopram (CELEXA) levothyroxine (SYNTHROID)  metoprolol succinate (TOPROL-XL)  montelukast (SINGULAIR)  If needed: fluticasone furoate-vilanterol (BREO ELLIPTA) --- Please bring all inhalers with you the day of surgery.    As of today, STOP taking any Aspirin (unless otherwise instructed by your surgeon), Aleve, Naproxen, Ibuprofen, Motrin, Advil, Goody's, BC's, all herbal medications, fish oil, and all vitamins.    The Morning of Surgery  Do not wear jewelry, make-up or nail polish.  Do not wear lotions, powders, or perfumes, or deodorant  Do not shave 48 hours prior to surgery.    Do not bring valuables to the hospital.  [facility] is not responsible for any belongings or valuables.  If you are a smoker, DO NOT Smoke 24 hours prior to surgery  If you wear a CPAP at night please bring your mask the morning of surgery   Remember that you must have someone to transport you home after your surgery, and remain with you for 24 hours if you are discharged the same day.   Please bring cases for contacts, glasses, hearing aids, dentures or bridgework because it cannot be worn into surgery.    Leave your suitcase in the car.  After surgery it may be brought to your room.  For patients admitted to the hospital, discharge time will be determined by your treatment team.  Patients discharged the day of surgery will not be allowed to drive home.    Special instructions:   [facility]- Preparing For Surgery  Before surgery, you can play an important role. Because skin is not sterile, your skin needs to be as free of germs as possible. You can reduce the number of germs on your skin by washing with CHG (chlorahexidine gluconate) Soap before surgery.  CHG is an antiseptic cleaner which kills germs and bonds with the skin to continue killing germs even after washing.    Oral Hygiene is also important to reduce your risk of infection.  Remember - BRUSH YOUR TEETH THE MORNING OF SURGERY WITH YOUR REGULAR TOOTHPASTE  Please do not use if you have an allergy to CHG or antibacterial soaps. If your skin becomes reddened/irritated stop using the CHG.  Do not shave (including legs and underarms) for at least 48 hours prior to first CHG shower. It is OK to shave your face.  Please follow these instructions carefully.   1. Shower the NIGHT BEFORE SURGERY and the MORNING OF SURGERY with CHG Soap.   2. If you chose to wash your hair and body, wash as usual with your normal shampoo and body-wash/soap.  3. Rinse your hair and body thoroughly to remove the shampoo and soap.  4. Apply CHG directly to the skin (ONLY FROM THE NECK DOWN) and wash gently with a scrungie or a clean washcloth.   5. Do not use on open wounds or open sores. Avoid contact with your eyes, ears, mouth and genitals (private parts). Wash Face and genitals (private parts)  with  your normal soap.   6. Wash thoroughly, paying special attention to the area where your surgery will be performed.  7. Thoroughly rinse your body with warm water from the neck down.  8. DO NOT shower/wash with your normal soap after using and rinsing off the CHG Soap.  9. Pat yourself dry with a CLEAN TOWEL.  10. Wear CLEAN PAJAMAS to bed the night before surgery  11. Place CLEAN SHEETS on your bed the night of your first shower and DO NOT SLEEP WITH PETS.  12. Wear comfortable clothes the morning of surgery.     Day of  Surgery:  Please shower the morning of surgery with the CHG soap Do not apply any deodorants/lotions. Please wear clean clothes to the hospital/surgery center.   Remember to brush your teeth WITH YOUR REGULAR TOOTHPASTE.   Please read over the following fact sheets that you were given.

## 2020-07-04 ENCOUNTER — Encounter (HOSPITAL_COMMUNITY)
Admission: RE | Admit: 2020-07-04 | Discharge: 2020-07-04 | Disposition: A | Discharge status: Home / Self Care | Payer: Medicare PPO | Source: Ambulatory Visit | Attending: Orthopedic Surgery | Admitting: Orthopedic Surgery

## 2020-07-04 ENCOUNTER — Other Ambulatory Visit: Payer: Self-pay

## 2020-07-04 ENCOUNTER — Encounter (HOSPITAL_COMMUNITY): Payer: Self-pay

## 2020-07-04 DIAGNOSIS — Z01812 Encounter for preprocedural laboratory examination: Secondary | ICD-10-CM | POA: Insufficient documentation

## 2020-07-04 HISTORY — DX: Gastro-esophageal reflux disease without esophagitis: K21.9

## 2020-07-04 HISTORY — DX: Unspecified asthma, uncomplicated: J45.909

## 2020-07-04 LAB — BASIC METABOLIC PANEL
Anion gap: 10 (ref 5–15)
BUN: 17 mg/dL (ref 8–23)
CO2: 25 mmol/L (ref 22–32)
Calcium: 9.6 mg/dL (ref 8.9–10.3)
Chloride: 103 mmol/L (ref 98–111)
Creatinine, Ser: 1.09 mg/dL — ABNORMAL HIGH (ref 0.44–1.00)
GFR calc Af Amer: >60 mL/min (ref >60)
GFR calc non Af Amer: 52 mL/min — ABNORMAL LOW (ref >60)
Glucose, Bld: 112 mg/dL — ABNORMAL HIGH (ref 70–99)
Potassium: 4 mmol/L (ref 3.5–5.1)
Sodium: 138 mmol/L (ref 135–145)

## 2020-07-04 LAB — SURGICAL PCR SCREEN
MRSA, PCR: NEGATIVE
Staphylococcus aureus: NEGATIVE

## 2020-07-04 LAB — CBC
HCT: 38.6 % (ref 36.0–46.0)
Hemoglobin: 12.2 g/dL (ref 12.0–15.0)
MCH: 30.4 pg (ref 26.0–34.0)
MCHC: 31.6 g/dL (ref 30.0–36.0)
MCV: 96.3 fL (ref 80.0–100.0)
Platelets: 244 10*3/uL (ref 150–400)
RBC: 4.01 MIL/uL (ref 3.87–5.11)
RDW: 13.8 % (ref 11.5–15.5)
WBC: 7.7 10*3/uL (ref 4.0–10.5)
nRBC: 0 % (ref 0.0–0.2)

## 2020-07-04 LAB — TYPE AND SCREEN
ABO/RH(D): A NEG
Antibody Screen: NEGATIVE

## 2020-07-04 NOTE — Progress Notes (Addendum)
Your procedure is scheduled on Wednesday July 28.  Report to [facility] Main Entrance "A" at 05:30 A.M., and check in at the Admitting office.  Call this number if you have problems the morning of surgery: [phone]  Call [phone] if you have any questions prior to your surgery date Monday-Friday 8am-4pm  Remember: Do not eat or drink after midnight the night before your surgery  Take these medicines the morning of surgery with A SIP OF WATER: atorvastatin (LIPITOR) citalopram (CELEXA) levothyroxine (SYNTHROID)  metoprolol succinate (TOPROL-XL)  montelukast (SINGULAIR)  If needed: fluticasone furoate-vilanterol (BREO ELLIPTA) --- Please bring all inhalers with you the day of surgery.   Per cardiologist's order: Hold Pletal 6 days prior surgery  As of today, STOP taking Aleve, Naproxen, Ibuprofen, Motrin, Advil, Goody's, BC's, all herbal medications, fish oil, and all vitamins.   The Morning of Surgery  Do not wear jewelry, make-up or nail polish.  Do not wear lotions, powders, or perfumes, or deodorant  Do not shave 48 hours prior to surgery.    Do not bring valuables to the hospital.  [facility] is not responsible for any belongings or valuables.  If you are a smoker, DO NOT Smoke 24 hours prior to surgery  If you wear a CPAP at night please bring your mask the morning of surgery   Remember that you must have someone to transport you home after your surgery, and remain with you for 24 hours if you are discharged the same day.   Please bring cases for contacts, glasses, hearing aids, dentures or bridgework because it cannot be worn into surgery.    Leave your suitcase in the car.  After surgery it may be brought to your room.  For patients admitted to the hospital, discharge time will be determined by your treatment team.  Patients discharged the day of surgery will not be allowed to drive home.    Special instructions:   [facility]- Preparing For  Surgery  Before surgery, you can play an important role. Because skin is not sterile, your skin needs to be as free of germs as possible. You can reduce the number of germs on your skin by washing with CHG (chlorahexidine gluconate) Soap before surgery.  CHG is an antiseptic cleaner which kills germs and bonds with the skin to continue killing germs even after washing.    Oral Hygiene is also important to reduce your risk of infection.  Remember - BRUSH YOUR TEETH THE MORNING OF SURGERY WITH YOUR REGULAR TOOTHPASTE  Please do not use if you have an allergy to CHG or antibacterial soaps. If your skin becomes reddened/irritated stop using the CHG.  Do not shave (including legs and underarms) for at least 48 hours prior to first CHG shower. It is OK to shave your face.  Please follow these instructions carefully.   1. Shower the NIGHT BEFORE SURGERY and the MORNING OF SURGERY with CHG Soap.   2. If you chose to wash your hair and body, wash as usual with your normal shampoo and body-wash/soap.  3. Rinse your hair and body thoroughly to remove the shampoo and soap.  4. Apply CHG directly to the skin (ONLY FROM THE NECK DOWN) and wash gently with a scrungie or a clean washcloth.   5. Do not use on open wounds or open sores. Avoid contact with your eyes, ears, mouth and genitals (private parts). Wash Face and genitals (private parts)  with your normal soap.   6. Wash  thoroughly, paying special attention to the area where your surgery will be performed.  7. Thoroughly rinse your body with warm water from the neck down.  8. DO NOT shower/wash with your normal soap after using and rinsing off the CHG Soap.  9. Pat yourself dry with a CLEAN TOWEL.  10. Wear CLEAN PAJAMAS to bed the night before surgery  11. Place CLEAN SHEETS on your bed the night of your first shower and DO NOT SLEEP WITH PETS.  12. Wear comfortable clothes the morning of surgery.     Day of Surgery:  Please shower the  morning of surgery with the CHG soap Do not apply any deodorants/lotions. Please wear clean clothes to the hospital/surgery center.   Remember to brush your teeth WITH YOUR REGULAR TOOTHPASTE.   Please read over the following fact sheets that you were given.

## 2020-07-04 NOTE — Progress Notes (Addendum)
PCP - Jodi Mourning, FNP Cardiologist - Dr. Adrian Prows  PPM/ICD - denies  Chest x-ray - N/A EKG - 02/05/2020 Stress Test - 06/26/2019 ECHO - 05/10/2019 Cardiac Cath - denies  Sleep Study - denies CPAP - N/A  DM: denies  Blood Thinner Instructions: N/A Aspirin Instructions: N/A  Update: ERAS Protocol - Yes  COVID TEST- Scheduled for 07/08/2020. Patient verbalized understanding of self-quarantine instructions appointment time and place.  Anesthesia review: YES, cardiac clearance  Patient denies shortness of breath, fever, cough and chest pain at PAT appointment  All instructions explained to the patient, with a verbal understanding of the material. Patient agrees to go over the instructions while at home for a better understanding. Patient also instructed to self quarantine after being tested for COVID-19. The opportunity to ask questions was provided.

## 2020-07-04 NOTE — Progress Notes (Signed)
No pre-op orders in for patient at time of PAT appointment. Dr. Lynann Bologna notified via [employer] and Butch Penny at the office notified via phone call.

## 2020-07-05 ENCOUNTER — Other Ambulatory Visit: Payer: Self-pay | Admitting: Orthopedic Surgery

## 2020-07-05 NOTE — Progress Notes (Signed)
Remember:  Do not eat after midnight the night before your surgery  You may drink clear liquids until 4:30 A.M. the morning of your surgery.   Clear liquids allowed are: Water, Non-Citrus Juices (without pulp), Carbonated Beverages, Clear Tea, Black Coffee Only, and Gatorade   . Enhanced Recovery after Surgery for Orthopedics Enhanced Recovery after Surgery is a protocol used to improve the stress on your body and your recovery after surgery.  Patient Instructions  . The night before surgery:  o No food after midnight. ONLY clear liquids after midnight  .  Marland Kitchen The day of surgery (if you do NOT have diabetes):  Drink ONE (1) Pre-Surgery Clear Ensure by 4:30 A.M. the morning of surgery.  o Nothing else to drink after completing the  Pre-Surgery Clear Ensure.         If you have questions, please contact your surgeon's office.

## 2020-07-08 ENCOUNTER — Other Ambulatory Visit (HOSPITAL_COMMUNITY)
Admission: RE | Admit: 2020-07-08 | Discharge: 2020-07-08 | Disposition: A | Discharge status: Home / Self Care | Payer: Medicare PPO | Source: Ambulatory Visit | Attending: Orthopedic Surgery | Admitting: Orthopedic Surgery

## 2020-07-08 DIAGNOSIS — Z20822 Contact with and (suspected) exposure to covid-19: Secondary | ICD-10-CM | POA: Insufficient documentation

## 2020-07-08 DIAGNOSIS — Z01812 Encounter for preprocedural laboratory examination: Secondary | ICD-10-CM | POA: Diagnosis present

## 2020-07-08 LAB — SARS CORONAVIRUS 2 (TAT 6-24 HRS): SARS Coronavirus 2: NEGATIVE

## 2020-07-08 NOTE — Progress Notes (Signed)
Anesthesia Chart Review:  Patient follows with cardiology for history of left CEA due to symptomatic carotid stenosis in 2012 with TIA, asymptomatic regarding stenosis, hypertension, hyperlipidemia.  She has also been recently worked up for lower extremity claudication symptoms.  Lower extremity duplex showed stenosis in the right SFA but her symptoms are bilateral, and it was felt a competent of neuropathic pain is also possible. Last seen by Dr. Einar Gip 04/29/2020 and at that time Plavix was discontinued and she was put on Pletal to see if her symptoms would improve.  Dr. Einar Gip cleared patient for surgery per letter dated 06/18/2020, "[name] [name] is at low risk, from a cardiac standpoint, for her upcoming procedure: lumbar fusion and allograft placement.  It is ok to proceed without further cardiac testing. If applicable can hold Pletal and ASA for 6  day(s) prior to procedure and re-start Post procedure when safe."  Preop labs reviewed, unremarkable.  EKG 02/05/2020: Sinus rhythm.  Nonspecific T abnormality.  Rate 67.  Echocardiogram 3/35/4562:  Normal systolic function, EF 56%.  Mild LVH.  Grade 2 diastolic dysfunction.  Moderately dilated left atrium. Mild thickening of mitral valve with mild calcification.  Mild aortic valve weakening and calcification.  Event Monitor for 14 days Start date 06/02/2019: Predominant rhythm is normal sinus rhythm.: Symptomatic transmissions of flutter and chest pain revealed normal sinus rhythm. Automatic detected event revealed one episode of 12 beat (8 Sec) wide complex tachycardia suggestive of NSVT at 116 bpm at 2:30 PM on 06/10/2019.  Lexiscan Myoview Stress Test07/13/2020: Resting EKG demonstrates sinus rhythm with short PR interval, borderline criteria for LVH. stress EKG is nondiagnostic for ischemia as his of alcoholic stress test. Symptoms during test were SOB and Dizziness.  Myocardial pefusion imaging is normal. Left ventricular ejection fraction  is 67% with normal wall motion. Low risk study.  Carotid artery duplex 07/07/2019: Stenosis in the right internal carotid artery (16-49%). Minimal stenosis in the left internal carotid artery (minimal). Patent left endarterectomy site.  Antegrade right vertebral artery flow. Antegrade left vertebral artery flow. Follow up in one year is appropriate if clinically indicated.  Lower Extremity Arterial Duplex 04/22/2020:  Right mid SFA has monophasic waveform suggests diffuse disease and may  indicate hemodynamically significant stenosis.  No hemodynamically significant stenosis is identified in the left lower  extremity arterial system.  This exam reveals mildly decreased perfusion of the right lower extremity,  noted at the dorsalis pedis and post tibial artery level (ABI 0.93) and  mildly decreased perfusion of the left lower extremity, noted at the  dorsalis pedis and post tibial artery level (ABI 0.93).    [name] [name] [facility] Short Stay Center/Anesthesiology Phone [phone] 07/08/2020 1:04 PM

## 2020-07-08 NOTE — Anesthesia Preprocedure Evaluation (Addendum)
Anesthesia Evaluation  Patient identified by MRN, date of birth, ID band Patient awake    Reviewed: Allergy & Precautions, NPO status , Patient's Chart, lab work & pertinent test results, reviewed documented beta blocker date and time   History of Anesthesia Complications Negative for: history of anesthetic complications  Airway Mallampati: III  TM Distance: >3 FB Neck ROM: Full    Dental  (+) Teeth Intact, Dental Advisory Given, Poor Dentition, Chipped, Missing,    Pulmonary asthma , former smoker,    Pulmonary exam normal breath sounds clear to auscultation       Cardiovascular hypertension, Pt. on medications and Pt. on home beta blockers + Peripheral Vascular Disease (s/p L CEA)  Normal cardiovascular exam Rhythm:Regular Rate:Normal     Neuro/Psych  Headaches, PSYCHIATRIC DISORDERS Anxiety Depression TIA   GI/Hepatic GERD  Medicated,(+)     substance abuse  alcohol use,   Endo/Other  Hypothyroidism Obesity   Renal/GU negative Renal ROS     Musculoskeletal  (+) Arthritis ,   Abdominal   Peds  Hematology negative hematology ROS (+)   Anesthesia Other Findings   Reproductive/Obstetrics                           Anesthesia Physical Anesthesia Plan  ASA: III  Anesthesia Plan: General   Post-op Pain Management:    Induction: Intravenous  PONV Risk Score and Plan: 3 and Midazolam, Dexamethasone and Ondansetron  Airway Management Planned: Oral ETT  Additional Equipment:   Intra-op Plan:   Post-operative Plan: Extubation in OR  Informed Consent: I have reviewed the patients History and Physical, chart, labs and discussed the procedure including the risks, benefits and alternatives for the proposed anesthesia with the patient or authorized representative who has indicated his/her understanding and acceptance.     Dental advisory given  Plan Discussed with:  CRNA  Anesthesia Plan Comments: (PAT note by Karoline Caldwell, PA-C: Patient follows with cardiology for history of left CEA due to symptomatic carotid stenosis in 2012 with TIA, asymptomatic regarding stenosis, hypertension, hyperlipidemia.  She has also been recently worked up for lower extremity claudication symptoms.  Lower extremity duplex showed stenosis in the right SFA but her symptoms are bilateral, and it was felt a competent of neuropathic pain is also possible. Last seen by Dr. Einar Gip 04/29/2020 and at that time Plavix was discontinued and she was put on Pletal to see if her symptoms would improve.  Dr. Einar Gip cleared patient for surgery per letter dated 06/18/2020, "Wilma B Cedano is at low risk, from a cardiac standpoint, for her upcoming procedure: lumbar fusion and allograft placement.  It is ok to proceed without further cardiac testing. If applicable can hold Pletal and ASA for 6  day(s) prior to procedure and re-start Post procedure when safe."  Preop labs reviewed, unremarkable.  EKG 02/05/2020: Sinus rhythm.  Nonspecific T abnormality.  Rate 67.  Echocardiogram 9/79/8921:  Normal systolic function, EF 19%.  Mild LVH.  Grade 2 diastolic dysfunction.  Moderately dilated left atrium. Mild thickening of mitral valve with mild calcification.  Mild aortic valve weakening and calcification.  Event Monitor for 14 days Start date 06/02/2019: Predominant rhythm is normal sinus rhythm.: Symptomatic transmissions of flutter and chest pain revealed normal sinus rhythm. Automatic detected event revealed one episode of 12 beat (8 Sec) wide complex tachycardia suggestive of NSVT at 116 bpm at 2:30 PM on 06/10/2019.  Lexiscan Myoview Stress Test07/13/2020: Resting EKG demonstrates  sinus rhythm with short PR interval, borderline criteria for LVH. stress EKG is nondiagnostic for ischemia as his of alcoholic stress test. Symptoms during test were SOB and Dizziness.  Myocardial pefusion imaging is  normal. Left ventricular ejection fraction is 67% with normal wall motion. Low risk study.  Carotid artery duplex 07/07/2019: Stenosis in the right internal carotid artery (16-49%). Minimal stenosis in the left internal carotid artery (minimal). Patent left endarterectomy site.  Antegrade right vertebral artery flow. Antegrade left vertebral artery flow. Follow up in one year is appropriate if clinically indicated.  Lower Extremity Arterial Duplex 04/22/2020:  Right mid SFA has monophasic waveform suggests diffuse disease and may  indicate hemodynamically significant stenosis.  No hemodynamically significant stenosis is identified in the left lower  extremity arterial system.  This exam reveals mildly decreased perfusion of the right lower extremity,  noted at the dorsalis pedis and post tibial artery level (ABI 0.93) and  mildly decreased perfusion of the left lower extremity, noted at the  dorsalis pedis and post tibial artery level (ABI 0.93).  )      Anesthesia Quick Evaluation

## 2020-07-09 ENCOUNTER — Ambulatory Visit (INDEPENDENT_AMBULATORY_CARE_PROVIDER_SITE_OTHER): Payer: Medicare PPO | Admitting: Psychology

## 2020-07-09 DIAGNOSIS — F330 Major depressive disorder, recurrent, mild: Secondary | ICD-10-CM | POA: Diagnosis not present

## 2020-07-10 ENCOUNTER — Encounter (HOSPITAL_COMMUNITY): Payer: Self-pay | Admitting: Orthopedic Surgery

## 2020-07-10 ENCOUNTER — Ambulatory Visit (HOSPITAL_COMMUNITY)
Admission: RE | Disposition: A | Discharge status: Home Health | Payer: Self-pay | Source: Home / Self Care | Attending: Orthopedic Surgery

## 2020-07-10 ENCOUNTER — Ambulatory Visit (HOSPITAL_COMMUNITY)
Admission: RE | Admit: 2020-07-10 | Discharge: 2020-07-11 | Disposition: A | Discharge status: Home Health | Payer: Medicare PPO | Attending: Orthopedic Surgery | Admitting: Orthopedic Surgery

## 2020-07-10 ENCOUNTER — Ambulatory Visit (HOSPITAL_COMMUNITY): Payer: Medicare PPO | Admitting: Certified Registered Nurse Anesthetist

## 2020-07-10 ENCOUNTER — Ambulatory Visit (HOSPITAL_COMMUNITY): Payer: Medicare PPO

## 2020-07-10 ENCOUNTER — Ambulatory Visit (HOSPITAL_COMMUNITY): Payer: Medicare PPO | Admitting: Vascular Surgery

## 2020-07-10 ENCOUNTER — Other Ambulatory Visit: Payer: Self-pay

## 2020-07-10 DIAGNOSIS — I700 Atherosclerosis of aorta: Secondary | ICD-10-CM | POA: Insufficient documentation

## 2020-07-10 DIAGNOSIS — J45909 Unspecified asthma, uncomplicated: Secondary | ICD-10-CM | POA: Diagnosis not present

## 2020-07-10 DIAGNOSIS — Z79899 Other long term (current) drug therapy: Secondary | ICD-10-CM | POA: Diagnosis not present

## 2020-07-10 DIAGNOSIS — I739 Peripheral vascular disease, unspecified: Secondary | ICD-10-CM

## 2020-07-10 DIAGNOSIS — I10 Essential (primary) hypertension: Secondary | ICD-10-CM | POA: Diagnosis not present

## 2020-07-10 DIAGNOSIS — M5416 Radiculopathy, lumbar region: Secondary | ICD-10-CM | POA: Insufficient documentation

## 2020-07-10 DIAGNOSIS — G9519 Other vascular myelopathies: Secondary | ICD-10-CM | POA: Diagnosis present

## 2020-07-10 DIAGNOSIS — F17220 Nicotine dependence, chewing tobacco, uncomplicated: Secondary | ICD-10-CM | POA: Diagnosis not present

## 2020-07-10 DIAGNOSIS — G2581 Restless legs syndrome: Secondary | ICD-10-CM | POA: Diagnosis not present

## 2020-07-10 DIAGNOSIS — Z8673 Personal history of transient ischemic attack (TIA), and cerebral infarction without residual deficits: Secondary | ICD-10-CM | POA: Diagnosis not present

## 2020-07-10 DIAGNOSIS — M4316 Spondylolisthesis, lumbar region: Secondary | ICD-10-CM | POA: Diagnosis not present

## 2020-07-10 DIAGNOSIS — G4700 Insomnia, unspecified: Secondary | ICD-10-CM | POA: Diagnosis not present

## 2020-07-10 DIAGNOSIS — G894 Chronic pain syndrome: Secondary | ICD-10-CM | POA: Insufficient documentation

## 2020-07-10 DIAGNOSIS — Z79890 Hormone replacement therapy (postmenopausal): Secondary | ICD-10-CM | POA: Diagnosis not present

## 2020-07-10 DIAGNOSIS — Z880 Allergy status to penicillin: Secondary | ICD-10-CM | POA: Insufficient documentation

## 2020-07-10 DIAGNOSIS — Z888 Allergy status to other drugs, medicaments and biological substances status: Secondary | ICD-10-CM | POA: Diagnosis not present

## 2020-07-10 DIAGNOSIS — Z8249 Family history of ischemic heart disease and other diseases of the circulatory system: Secondary | ICD-10-CM | POA: Diagnosis not present

## 2020-07-10 DIAGNOSIS — E785 Hyperlipidemia, unspecified: Secondary | ICD-10-CM | POA: Diagnosis not present

## 2020-07-10 DIAGNOSIS — E669 Obesity, unspecified: Secondary | ICD-10-CM | POA: Insufficient documentation

## 2020-07-10 DIAGNOSIS — K219 Gastro-esophageal reflux disease without esophagitis: Secondary | ICD-10-CM | POA: Diagnosis not present

## 2020-07-10 DIAGNOSIS — F329 Major depressive disorder, single episode, unspecified: Secondary | ICD-10-CM | POA: Insufficient documentation

## 2020-07-10 DIAGNOSIS — E039 Hypothyroidism, unspecified: Secondary | ICD-10-CM | POA: Insufficient documentation

## 2020-07-10 DIAGNOSIS — Z8349 Family history of other endocrine, nutritional and metabolic diseases: Secondary | ICD-10-CM | POA: Insufficient documentation

## 2020-07-10 DIAGNOSIS — Z6828 Body mass index (BMI) 28.0-28.9, adult: Secondary | ICD-10-CM | POA: Insufficient documentation

## 2020-07-10 DIAGNOSIS — Z419 Encounter for procedure for purposes other than remedying health state, unspecified: Secondary | ICD-10-CM

## 2020-07-10 DIAGNOSIS — R29818 Other symptoms and signs involving the nervous system: Secondary | ICD-10-CM | POA: Diagnosis present

## 2020-07-10 DIAGNOSIS — Z7951 Long term (current) use of inhaled steroids: Secondary | ICD-10-CM | POA: Diagnosis not present

## 2020-07-10 DIAGNOSIS — M48061 Spinal stenosis, lumbar region without neurogenic claudication: Secondary | ICD-10-CM | POA: Insufficient documentation

## 2020-07-10 DIAGNOSIS — F419 Anxiety disorder, unspecified: Secondary | ICD-10-CM | POA: Insufficient documentation

## 2020-07-10 HISTORY — PX: TRANSFORAMINAL LUMBAR INTERBODY FUSION (TLIF) WITH PEDICLE SCREW FIXATION 1 LEVEL: SHX6141

## 2020-07-10 LAB — CBC WITH DIFFERENTIAL/PLATELET
Abs Immature Granulocytes: 0.03 10*3/uL (ref 0.00–0.07)
Basophils Absolute: 0 10*3/uL (ref 0.0–0.1)
Basophils Relative: 0 %
Eosinophils Absolute: 0.2 10*3/uL (ref 0.0–0.5)
Eosinophils Relative: 2 %
HCT: 37.8 % (ref 36.0–46.0)
Hemoglobin: 11.8 g/dL — ABNORMAL LOW (ref 12.0–15.0)
Immature Granulocytes: 0 %
Lymphocytes Relative: 26 %
Lymphs Abs: 2.6 10*3/uL (ref 0.7–4.0)
MCH: 30.2 pg (ref 26.0–34.0)
MCHC: 31.2 g/dL (ref 30.0–36.0)
MCV: 96.7 fL (ref 80.0–100.0)
Monocytes Absolute: 1.1 10*3/uL — ABNORMAL HIGH (ref 0.1–1.0)
Monocytes Relative: 11 %
Neutro Abs: 6.1 10*3/uL (ref 1.7–7.7)
Neutrophils Relative %: 61 %
Platelets: 245 10*3/uL (ref 150–400)
RBC: 3.91 MIL/uL (ref 3.87–5.11)
RDW: 13.8 % (ref 11.5–15.5)
WBC: 10.1 10*3/uL (ref 4.0–10.5)
nRBC: 0 % (ref 0.0–0.2)

## 2020-07-10 LAB — COMPREHENSIVE METABOLIC PANEL
ALT: 19 U/L (ref 0–44)
AST: 21 U/L (ref 15–41)
Albumin: 3.7 g/dL (ref 3.5–5.0)
Alkaline Phosphatase: 87 U/L (ref 38–126)
Anion gap: 9 (ref 5–15)
BUN: 19 mg/dL (ref 8–23)
CO2: 24 mmol/L (ref 22–32)
Calcium: 9.6 mg/dL (ref 8.9–10.3)
Chloride: 105 mmol/L (ref 98–111)
Creatinine, Ser: 1.07 mg/dL — ABNORMAL HIGH (ref 0.44–1.00)
GFR calc Af Amer: >60 mL/min (ref >60)
GFR calc non Af Amer: 53 mL/min — ABNORMAL LOW (ref >60)
Glucose, Bld: 123 mg/dL — ABNORMAL HIGH (ref 70–99)
Potassium: 3.5 mmol/L (ref 3.5–5.1)
Sodium: 138 mmol/L (ref 135–145)
Total Bilirubin: 0.4 mg/dL (ref 0.3–1.2)
Total Protein: 6.6 g/dL (ref 6.5–8.1)

## 2020-07-10 LAB — URINALYSIS, ROUTINE W REFLEX MICROSCOPIC
Bilirubin Urine: NEGATIVE
Glucose, UA: NEGATIVE mg/dL
Hgb urine dipstick: NEGATIVE
Ketones, ur: NEGATIVE mg/dL
Nitrite: NEGATIVE
Protein, ur: NEGATIVE mg/dL
Specific Gravity, Urine: 1.018 (ref 1.005–1.030)
pH: 5 (ref 5.0–8.0)

## 2020-07-10 LAB — PROTIME-INR
INR: 1.1 (ref 0.8–1.2)
Prothrombin Time: 13.9 seconds (ref 11.4–15.2)

## 2020-07-10 LAB — APTT: aPTT: 27 seconds (ref 24–36)

## 2020-07-10 LAB — ABO/RH: ABO/RH(D): A NEG

## 2020-07-10 SURGERY — TRANSFORAMINAL LUMBAR INTERBODY FUSION (TLIF) WITH PEDICLE SCREW FIXATION 1 LEVEL
Anesthesia: General | Site: Spine Lumbar | Laterality: Left

## 2020-07-10 MED ORDER — METHOCARBAMOL 500 MG PO TABS
500.0000 mg | ORAL_TABLET | Freq: Four times a day (QID) | ORAL | Status: DC | PRN
  Administered 2020-07-10 – 2020-07-11 (×3): 500 mg via ORAL
  Filled 2020-07-10 (×2): qty 1

## 2020-07-10 MED ORDER — EPHEDRINE 5 MG/ML INJ
INTRAVENOUS | Status: AC
  Filled 2020-07-10: qty 10

## 2020-07-10 MED ORDER — SODIUM CHLORIDE 0.9 % IV SOLN: 250.0000 mL | INTRAVENOUS | Status: DC

## 2020-07-10 MED ORDER — LACTATED RINGERS IV SOLN: INTRAVENOUS | Status: DC | PRN

## 2020-07-10 MED ORDER — METHOCARBAMOL 1000 MG/10ML IJ SOLN
500.0000 mg | Freq: Four times a day (QID) | INTRAVENOUS | Status: DC | PRN
  Filled 2020-07-10: qty 5

## 2020-07-10 MED ORDER — ATORVASTATIN CALCIUM 40 MG PO TABS
40.0000 mg | ORAL_TABLET | Freq: Every day | ORAL | Status: DC
  Administered 2020-07-11: 40 mg via ORAL
  Filled 2020-07-10: qty 1

## 2020-07-10 MED ORDER — LACTATED RINGERS IV SOLN: INTRAVENOUS | Status: DC

## 2020-07-10 MED ORDER — PROMETHAZINE HCL 25 MG/ML IJ SOLN: 6.2500 mg | INTRAMUSCULAR | Status: DC | PRN

## 2020-07-10 MED ORDER — SODIUM CHLORIDE 0.9% FLUSH: 3.0000 mL | INTRAVENOUS | Status: DC | PRN

## 2020-07-10 MED ORDER — METOPROLOL SUCCINATE ER 25 MG PO TB24: 25.0000 mg | ORAL_TABLET | Freq: Every day | ORAL | Status: DC

## 2020-07-10 MED ORDER — ALBUTEROL SULFATE HFA 108 (90 BASE) MCG/ACT IN AERS
INHALATION_SPRAY | RESPIRATORY_TRACT | Status: DC | PRN
  Administered 2020-07-10: 4 via RESPIRATORY_TRACT

## 2020-07-10 MED ORDER — MIDAZOLAM HCL 2 MG/2ML IJ SOLN
INTRAMUSCULAR | Status: AC
  Filled 2020-07-10: qty 2

## 2020-07-10 MED ORDER — ROCURONIUM BROMIDE 10 MG/ML (PF) SYRINGE
PREFILLED_SYRINGE | INTRAVENOUS | Status: DC | PRN
  Administered 2020-07-10: 50 mg via INTRAVENOUS

## 2020-07-10 MED ORDER — FENTANYL CITRATE (PF) 250 MCG/5ML IJ SOLN
INTRAMUSCULAR | Status: AC
  Filled 2020-07-10: qty 5

## 2020-07-10 MED ORDER — ROPINIROLE HCL 1 MG PO TABS
0.5000 mg | ORAL_TABLET | Freq: Every day | ORAL | Status: DC
  Administered 2020-07-10: 0.5 mg via ORAL
  Filled 2020-07-10: qty 1

## 2020-07-10 MED ORDER — FLEET ENEMA 7-19 GM/118ML RE ENEM: 1.0000 | ENEMA | Freq: Once | RECTAL | Status: DC | PRN

## 2020-07-10 MED ORDER — CILOSTAZOL 100 MG PO TABS
100.0000 mg | ORAL_TABLET | Freq: Two times a day (BID) | ORAL | Status: DC
  Administered 2020-07-11: 100 mg via ORAL
  Filled 2020-07-10 (×3): qty 1

## 2020-07-10 MED ORDER — BISACODYL 5 MG PO TBEC: 5.0000 mg | DELAYED_RELEASE_TABLET | Freq: Every day | ORAL | Status: DC | PRN

## 2020-07-10 MED ORDER — MIDAZOLAM HCL 5 MG/5ML IJ SOLN
INTRAMUSCULAR | Status: DC | PRN
  Administered 2020-07-10: 2 mg via INTRAVENOUS

## 2020-07-10 MED ORDER — SODIUM CHLORIDE 0.9% FLUSH
3.0000 mL | Freq: Two times a day (BID) | INTRAVENOUS | Status: DC
  Administered 2020-07-10: 3 mL via INTRAVENOUS

## 2020-07-10 MED ORDER — ONDANSETRON HCL 4 MG/2ML IJ SOLN
INTRAMUSCULAR | Status: DC | PRN
  Administered 2020-07-10: 4 mg via INTRAVENOUS

## 2020-07-10 MED ORDER — LIDOCAINE 2% (20 MG/ML) 5 ML SYRINGE
INTRAMUSCULAR | Status: DC | PRN
  Administered 2020-07-10: 60 mg via INTRAVENOUS

## 2020-07-10 MED ORDER — ROCURONIUM BROMIDE 10 MG/ML (PF) SYRINGE
PREFILLED_SYRINGE | INTRAVENOUS | Status: AC
  Filled 2020-07-10: qty 10

## 2020-07-10 MED ORDER — SENNOSIDES-DOCUSATE SODIUM 8.6-50 MG PO TABS: 1.0000 | ORAL_TABLET | Freq: Every evening | ORAL | Status: DC | PRN

## 2020-07-10 MED ORDER — ZOLPIDEM TARTRATE 5 MG PO TABS
10.0000 mg | ORAL_TABLET | Freq: Every day | ORAL | Status: DC
  Administered 2020-07-10: 10 mg via ORAL
  Filled 2020-07-10: qty 2

## 2020-07-10 MED ORDER — BUPIVACAINE LIPOSOME 1.3 % IJ SUSP
INTRAMUSCULAR | Status: DC | PRN
  Administered 2020-07-10: 20 mL

## 2020-07-10 MED ORDER — SPIRONOLACTONE 25 MG PO TABS
25.0000 mg | ORAL_TABLET | Freq: Every day | ORAL | Status: DC
  Administered 2020-07-11: 25 mg via ORAL
  Filled 2020-07-10: qty 1

## 2020-07-10 MED ORDER — BENAZEPRIL HCL 5 MG PO TABS
10.0000 mg | ORAL_TABLET | Freq: Every day | ORAL | Status: DC
  Administered 2020-07-10: 10 mg via ORAL
  Filled 2020-07-10 (×2): qty 2

## 2020-07-10 MED ORDER — EZETIMIBE 10 MG PO TABS
10.0000 mg | ORAL_TABLET | Freq: Every day | ORAL | Status: DC
  Administered 2020-07-10: 10 mg via ORAL
  Filled 2020-07-10 (×2): qty 1

## 2020-07-10 MED ORDER — FENTANYL CITRATE (PF) 100 MCG/2ML IJ SOLN
INTRAMUSCULAR | Status: AC
  Filled 2020-07-10: qty 2

## 2020-07-10 MED ORDER — BUPIVACAINE LIPOSOME 1.3 % IJ SUSP
20.0000 mL | Freq: Once | INTRAMUSCULAR | Status: DC
  Filled 2020-07-10: qty 20

## 2020-07-10 MED ORDER — GABAPENTIN 400 MG PO CAPS
400.0000 mg | ORAL_CAPSULE | Freq: Every day | ORAL | Status: DC
  Administered 2020-07-10: 400 mg via ORAL
  Filled 2020-07-10: qty 1

## 2020-07-10 MED ORDER — THROMBIN 20000 UNITS EX SOLR
CUTANEOUS | Status: AC
  Filled 2020-07-10: qty 20000

## 2020-07-10 MED ORDER — SUGAMMADEX SODIUM 200 MG/2ML IV SOLN
INTRAVENOUS | Status: DC | PRN
  Administered 2020-07-10: 200 mg via INTRAVENOUS

## 2020-07-10 MED ORDER — FLUTICASONE FUROATE-VILANTEROL 100-25 MCG/INH IN AEPB
1.0000 | INHALATION_SPRAY | Freq: Every day | RESPIRATORY_TRACT | Status: DC | PRN
  Filled 2020-07-10: qty 28

## 2020-07-10 MED ORDER — BUPIVACAINE-EPINEPHRINE (PF) 0.25% -1:200000 IJ SOLN
INTRAMUSCULAR | Status: AC
  Filled 2020-07-10: qty 30

## 2020-07-10 MED ORDER — DOCUSATE SODIUM 100 MG PO CAPS
100.0000 mg | ORAL_CAPSULE | Freq: Two times a day (BID) | ORAL | Status: DC
  Administered 2020-07-10 – 2020-07-11 (×2): 100 mg via ORAL
  Filled 2020-07-10 (×2): qty 1

## 2020-07-10 MED ORDER — AMLODIPINE BESYLATE 5 MG PO TABS
5.0000 mg | ORAL_TABLET | Freq: Every day | ORAL | Status: DC
  Administered 2020-07-10: 5 mg via ORAL
  Filled 2020-07-10: qty 1

## 2020-07-10 MED ORDER — MORPHINE SULFATE (PF) 2 MG/ML IV SOLN
1.0000 mg | INTRAVENOUS | Status: DC | PRN
  Administered 2020-07-11: 2 mg via INTRAVENOUS
  Filled 2020-07-10: qty 1

## 2020-07-10 MED ORDER — CHLORHEXIDINE GLUCONATE 0.12 % MT SOLN
15.0000 mL | Freq: Once | OROMUCOSAL | Status: AC
  Administered 2020-07-10: 15 mL via OROMUCOSAL
  Filled 2020-07-10: qty 15

## 2020-07-10 MED ORDER — VANCOMYCIN HCL IN DEXTROSE 1-5 GM/200ML-% IV SOLN
1000.0000 mg | INTRAVENOUS | Status: AC
  Administered 2020-07-10: 1000 mg via INTRAVENOUS
  Filled 2020-07-10: qty 200

## 2020-07-10 MED ORDER — METHYLENE BLUE 0.5 % INJ SOLN
INTRAVENOUS | Status: AC
  Filled 2020-07-10: qty 10

## 2020-07-10 MED ORDER — ONDANSETRON HCL 4 MG/2ML IJ SOLN
INTRAMUSCULAR | Status: AC
  Filled 2020-07-10: qty 2

## 2020-07-10 MED ORDER — LIDOCAINE 2% (20 MG/ML) 5 ML SYRINGE
INTRAMUSCULAR | Status: AC
  Filled 2020-07-10: qty 5

## 2020-07-10 MED ORDER — PHENYLEPHRINE HCL-NACL 10-0.9 MG/250ML-% IV SOLN
INTRAVENOUS | Status: DC | PRN
Start: 2020-07-10 — End: 2020-07-10
  Administered 2020-07-10: 25 ug/min via INTRAVENOUS

## 2020-07-10 MED ORDER — HYDROCHLOROTHIAZIDE 12.5 MG PO CAPS
12.5000 mg | ORAL_CAPSULE | Freq: Every day | ORAL | Status: DC
  Administered 2020-07-10: 12.5 mg via ORAL
  Filled 2020-07-10: qty 1

## 2020-07-10 MED ORDER — VANCOMYCIN HCL IN DEXTROSE 1-5 GM/200ML-% IV SOLN
1000.0000 mg | Freq: Once | INTRAVENOUS | Status: AC
  Administered 2020-07-10: 1000 mg via INTRAVENOUS
  Filled 2020-07-10: qty 200

## 2020-07-10 MED ORDER — ACETAMINOPHEN 500 MG PO TABS
1000.0000 mg | ORAL_TABLET | Freq: Once | ORAL | Status: AC
  Administered 2020-07-10: 1000 mg via ORAL
  Filled 2020-07-10: qty 2

## 2020-07-10 MED ORDER — PROPOFOL 500 MG/50ML IV EMUL
INTRAVENOUS | Status: DC | PRN
Start: 2020-07-10 — End: 2020-07-10
  Administered 2020-07-10: 75 ug/kg/min via INTRAVENOUS

## 2020-07-10 MED ORDER — ORAL CARE MOUTH RINSE: 15.0000 mL | Freq: Once | OROMUCOSAL | Status: AC

## 2020-07-10 MED ORDER — VITAMIN D 25 MCG (1000 UNIT) PO TABS
1000.0000 [IU] | ORAL_TABLET | Freq: Every day | ORAL | Status: DC
  Administered 2020-07-11: 1000 [IU] via ORAL
  Filled 2020-07-10: qty 1

## 2020-07-10 MED ORDER — METHYLENE BLUE 0.5 % INJ SOLN
INTRAVENOUS | Status: DC | PRN
  Administered 2020-07-10: 1 mL

## 2020-07-10 MED ORDER — CALCIUM CARBONATE-VITAMIN D 500-200 MG-UNIT PO TABS
1.0000 | ORAL_TABLET | Freq: Every day | ORAL | Status: DC
  Administered 2020-07-11: 1 via ORAL
  Filled 2020-07-10: qty 1

## 2020-07-10 MED ORDER — LEVOTHYROXINE SODIUM 75 MCG PO TABS
75.0000 ug | ORAL_TABLET | Freq: Every day | ORAL | Status: DC
  Administered 2020-07-11: 75 ug via ORAL
  Filled 2020-07-10: qty 1

## 2020-07-10 MED ORDER — MENTHOL 3 MG MT LOZG: 1.0000 | LOZENGE | OROMUCOSAL | Status: DC | PRN

## 2020-07-10 MED ORDER — FENTANYL CITRATE (PF) 100 MCG/2ML IJ SOLN
25.0000 ug | INTRAMUSCULAR | Status: DC | PRN
  Administered 2020-07-10 (×2): 25 ug via INTRAVENOUS

## 2020-07-10 MED ORDER — CITALOPRAM HYDROBROMIDE 20 MG PO TABS
20.0000 mg | ORAL_TABLET | Freq: Every day | ORAL | Status: DC
  Administered 2020-07-11: 20 mg via ORAL
  Filled 2020-07-10: qty 1

## 2020-07-10 MED ORDER — PHENOL 1.4 % MT LIQD: 1.0000 | OROMUCOSAL | Status: DC | PRN

## 2020-07-10 MED ORDER — MONTELUKAST SODIUM 10 MG PO TABS
10.0000 mg | ORAL_TABLET | Freq: Every day | ORAL | Status: DC
  Administered 2020-07-11: 10 mg via ORAL
  Filled 2020-07-10: qty 1

## 2020-07-10 MED ORDER — POVIDONE-IODINE 7.5 % EX SOLN
Freq: Once | CUTANEOUS | Status: DC
  Filled 2020-07-10: qty 118

## 2020-07-10 MED ORDER — POTASSIUM CHLORIDE IN NACL 20-0.9 MEQ/L-% IV SOLN: INTRAVENOUS | Status: DC

## 2020-07-10 MED ORDER — BENAZEPRIL-HYDROCHLOROTHIAZIDE 10-12.5 MG PO TABS: 1.0000 | ORAL_TABLET | Freq: Every day | ORAL | Status: DC

## 2020-07-10 MED ORDER — ACETAMINOPHEN 650 MG RE SUPP: 650.0000 mg | RECTAL | Status: DC | PRN

## 2020-07-10 MED ORDER — ONDANSETRON HCL 4 MG PO TABS: 4.0000 mg | ORAL_TABLET | Freq: Four times a day (QID) | ORAL | Status: DC | PRN

## 2020-07-10 MED ORDER — PROPOFOL 10 MG/ML IV BOLUS
INTRAVENOUS | Status: AC
  Filled 2020-07-10: qty 40

## 2020-07-10 MED ORDER — PHENYLEPHRINE 40 MCG/ML (10ML) SYRINGE FOR IV PUSH (FOR BLOOD PRESSURE SUPPORT)
PREFILLED_SYRINGE | INTRAVENOUS | Status: DC | PRN
  Administered 2020-07-10 (×2): 160 ug via INTRAVENOUS
  Administered 2020-07-10: 80 ug via INTRAVENOUS

## 2020-07-10 MED ORDER — DEXAMETHASONE SODIUM PHOSPHATE 10 MG/ML IJ SOLN
INTRAMUSCULAR | Status: AC
  Filled 2020-07-10: qty 1

## 2020-07-10 MED ORDER — ACETAMINOPHEN 325 MG PO TABS: 650.0000 mg | ORAL_TABLET | ORAL | Status: DC | PRN

## 2020-07-10 MED ORDER — BUPIVACAINE-EPINEPHRINE 0.25% -1:200000 IJ SOLN
INTRAMUSCULAR | Status: DC | PRN
  Administered 2020-07-10: 10 mL
  Administered 2020-07-10: 20 mL

## 2020-07-10 MED ORDER — EPHEDRINE SULFATE-NACL 50-0.9 MG/10ML-% IV SOSY
PREFILLED_SYRINGE | INTRAVENOUS | Status: DC | PRN
  Administered 2020-07-10: 5 mg via INTRAVENOUS
  Administered 2020-07-10 (×3): 10 mg via INTRAVENOUS
  Administered 2020-07-10: 5 mg via INTRAVENOUS
  Administered 2020-07-10: 10 mg via INTRAVENOUS

## 2020-07-10 MED ORDER — ALUM & MAG HYDROXIDE-SIMETH 200-200-20 MG/5ML PO SUSP: 30.0000 mL | Freq: Four times a day (QID) | ORAL | Status: DC | PRN

## 2020-07-10 MED ORDER — PROPOFOL 10 MG/ML IV BOLUS
INTRAVENOUS | Status: DC | PRN
  Administered 2020-07-10: 130 mg via INTRAVENOUS

## 2020-07-10 MED ORDER — DEXAMETHASONE SODIUM PHOSPHATE 10 MG/ML IJ SOLN
INTRAMUSCULAR | Status: DC | PRN
  Administered 2020-07-10: 10 mg via INTRAVENOUS

## 2020-07-10 MED ORDER — THROMBIN 20000 UNITS EX SOLR: CUTANEOUS | Status: DC | PRN

## 2020-07-10 MED ORDER — 0.9 % SODIUM CHLORIDE (POUR BTL) OPTIME
TOPICAL | Status: DC | PRN
  Administered 2020-07-10 (×2): 1000 mL

## 2020-07-10 MED ORDER — ZOLPIDEM TARTRATE 5 MG PO TABS: 5.0000 mg | ORAL_TABLET | Freq: Every evening | ORAL | Status: DC | PRN

## 2020-07-10 MED ORDER — FENTANYL CITRATE (PF) 250 MCG/5ML IJ SOLN
INTRAMUSCULAR | Status: DC | PRN
  Administered 2020-07-10: 50 ug via INTRAVENOUS
  Administered 2020-07-10 (×2): 100 ug via INTRAVENOUS

## 2020-07-10 MED ORDER — ONDANSETRON HCL 4 MG/2ML IJ SOLN: 4.0000 mg | Freq: Four times a day (QID) | INTRAMUSCULAR | Status: DC | PRN

## 2020-07-10 MED ORDER — OXYCODONE-ACETAMINOPHEN 5-325 MG PO TABS
1.0000 | ORAL_TABLET | ORAL | Status: DC | PRN
  Administered 2020-07-10 – 2020-07-11 (×5): 2 via ORAL
  Filled 2020-07-10 (×5): qty 2

## 2020-07-10 MED ORDER — PHENYLEPHRINE 40 MCG/ML (10ML) SYRINGE FOR IV PUSH (FOR BLOOD PRESSURE SUPPORT)
PREFILLED_SYRINGE | INTRAVENOUS | Status: AC
  Filled 2020-07-10: qty 10

## 2020-07-10 MED ORDER — METHOCARBAMOL 500 MG PO TABS
ORAL_TABLET | ORAL | Status: AC
  Filled 2020-07-10: qty 1

## 2020-07-10 SURGICAL SUPPLY — 89 items
APL SKNCLS STERI-STRIP NONHPOA (GAUZE/BANDAGES/DRESSINGS) ×1
BENZOIN TINCTURE PRP APPL 2/3 (GAUZE/BANDAGES/DRESSINGS) ×3 IMPLANT
BONE VIVIGEN FORMABLE 10CC (Bone Implant) ×3 IMPLANT
BUR PRESCISION 1.7 ELITE (BURR) ×3 IMPLANT
BUR ROUND FLUTED 5 RND (BURR) ×2 IMPLANT
BUR ROUND FLUTED 5MM RND (BURR) ×1
CAGE CONCORDE BULLET 9X10X23 (Cage) ×3 IMPLANT
CAGE SPNL 5D BLT NOSE 23X9X10 (Cage) IMPLANT
CARTRIDGE OIL MAESTRO DRILL (MISCELLANEOUS) ×1 IMPLANT
CLOSURE WOUND 1/2 X4 (GAUZE/BANDAGES/DRESSINGS) ×1
CNTNR URN SCR LID CUP LEK RST (MISCELLANEOUS) ×1 IMPLANT
CONT SPEC 4OZ STRL OR WHT (MISCELLANEOUS) ×3
COVER BACK TABLE 60X90IN (DRAPES) ×3 IMPLANT
COVER MAYO STAND STRL (DRAPES) ×6 IMPLANT
COVER SURGICAL LIGHT HANDLE (MISCELLANEOUS) ×3 IMPLANT
COVER WAND RF STERILE (DRAPES) ×3 IMPLANT
DIFFUSER DRILL AIR PNEUMATIC (MISCELLANEOUS) ×3 IMPLANT
DRAIN CHANNEL 15F RND FF W/TCR (WOUND CARE) IMPLANT
DRAPE C-ARM 42X72 X-RAY (DRAPES) ×3 IMPLANT
DRAPE POUCH INSTRU U-SHP 10X18 (DRAPES) ×3 IMPLANT
DRAPE SURG 17X23 STRL (DRAPES) ×12 IMPLANT
DURAPREP 26ML APPLICATOR (WOUND CARE) ×3 IMPLANT
ELECT BLADE 4.0 EZ CLEAN MEGAD (MISCELLANEOUS) ×3
ELECT CAUTERY BLADE 6.4 (BLADE) ×3 IMPLANT
ELECT REM PT RETURN 9FT ADLT (ELECTROSURGICAL) ×3
ELECTRODE BLDE 4.0 EZ CLN MEGD (MISCELLANEOUS) ×1 IMPLANT
ELECTRODE REM PT RTRN 9FT ADLT (ELECTROSURGICAL) ×1 IMPLANT
EVACUATOR SILICONE 100CC (DRAIN) IMPLANT
FEE INTRAOP MONITOR IMPULS NCS (MISCELLANEOUS) IMPLANT
FILTER STRAW FLUID ASPIR (MISCELLANEOUS) ×3 IMPLANT
GAUZE 4X4 16PLY RFD (DISPOSABLE) ×3 IMPLANT
GAUZE SPONGE 4X4 12PLY STRL (GAUZE/BANDAGES/DRESSINGS) ×3 IMPLANT
GAUZE SPONGE 4X4 12PLY STRL LF (GAUZE/BANDAGES/DRESSINGS) ×2 IMPLANT
GLOVE BIO SURGEON STRL SZ7 (GLOVE) ×3 IMPLANT
GLOVE BIO SURGEON STRL SZ8 (GLOVE) ×3 IMPLANT
GLOVE BIOGEL PI IND STRL 7.0 (GLOVE) ×1 IMPLANT
GLOVE BIOGEL PI IND STRL 8 (GLOVE) ×1 IMPLANT
GLOVE BIOGEL PI INDICATOR 7.0 (GLOVE) ×2
GLOVE BIOGEL PI INDICATOR 8 (GLOVE) ×2
GOWN STRL REUS W/ TWL LRG LVL3 (GOWN DISPOSABLE) ×2 IMPLANT
GOWN STRL REUS W/ TWL XL LVL3 (GOWN DISPOSABLE) ×1 IMPLANT
GOWN STRL REUS W/TWL LRG LVL3 (GOWN DISPOSABLE) ×6
GOWN STRL REUS W/TWL XL LVL3 (GOWN DISPOSABLE) ×3
GRAFT BNE MATRIX VG FRMBL L 10 (Bone Implant) IMPLANT
INTRAOP MONITOR FEE IMPULS NCS (MISCELLANEOUS) ×1
INTRAOP MONITOR FEE IMPULSE (MISCELLANEOUS) ×3
IV CATH 14GX2 1/4 (CATHETERS) ×3 IMPLANT
KIT BASIN OR (CUSTOM PROCEDURE TRAY) ×3 IMPLANT
KIT POSITION SURG JACKSON T1 (MISCELLANEOUS) ×3 IMPLANT
KIT TURNOVER KIT B (KITS) ×3 IMPLANT
MARKER SKIN DUAL TIP RULER LAB (MISCELLANEOUS) ×6 IMPLANT
NDL 18GX1X1/2 (RX/OR ONLY) (NEEDLE) ×1 IMPLANT
NDL HYPO 25GX1X1/2 BEV (NEEDLE) ×1 IMPLANT
NDL SPNL 18GX3.5 QUINCKE PK (NEEDLE) ×2 IMPLANT
NEEDLE 18GX1X1/2 (RX/OR ONLY) (NEEDLE) ×3 IMPLANT
NEEDLE 22X1 1/2 (OR ONLY) (NEEDLE) ×6 IMPLANT
NEEDLE HYPO 25GX1X1/2 BEV (NEEDLE) ×3 IMPLANT
NEEDLE SPNL 18GX3.5 QUINCKE PK (NEEDLE) ×6 IMPLANT
NS IRRIG 1000ML POUR BTL (IV SOLUTION) ×5 IMPLANT
OIL CARTRIDGE MAESTRO DRILL (MISCELLANEOUS) ×3
PACK LAMINECTOMY ORTHO (CUSTOM PROCEDURE TRAY) ×3 IMPLANT
PACK UNIVERSAL I (CUSTOM PROCEDURE TRAY) ×3 IMPLANT
PATTIES SURGICAL .5 X1 (DISPOSABLE) ×3 IMPLANT
PATTIES SURGICAL .5X1.5 (GAUZE/BANDAGES/DRESSINGS) ×3 IMPLANT
PROBE PED SCREW MONOP 3 BT NCS (MISCELLANEOUS) IMPLANT
ROD PRE LORDOSED 5.5X45 (Rod) ×4 IMPLANT
SCREW PROBE PEDICLE MONOPOLAR (MISCELLANEOUS) ×3
SCREW SET SINGLE INNER (Screw) ×8 IMPLANT
SCREW VIPER CORT FIX 6.00X30 (Screw) ×8 IMPLANT
SPONGE INTESTINAL PEANUT (DISPOSABLE) ×3 IMPLANT
SPONGE SURGIFOAM ABS GEL 100 (HEMOSTASIS) ×5 IMPLANT
STRIP CLOSURE SKIN 1/2X4 (GAUZE/BANDAGES/DRESSINGS) ×3 IMPLANT
SUT MNCRL AB 4-0 PS2 18 (SUTURE) ×3 IMPLANT
SUT VIC AB 0 CT1 18XCR BRD 8 (SUTURE) ×1 IMPLANT
SUT VIC AB 0 CT1 8-18 (SUTURE) ×3
SUT VIC AB 1 CT1 18XCR BRD 8 (SUTURE) ×1 IMPLANT
SUT VIC AB 1 CT1 8-18 (SUTURE) ×3
SUT VIC AB 2-0 CT2 18 VCP726D (SUTURE) ×3 IMPLANT
SYR 20ML LL LF (SYRINGE) ×6 IMPLANT
SYR BULB IRRIG 60ML STRL (SYRINGE) ×3 IMPLANT
SYR CONTROL 10ML LL (SYRINGE) ×6 IMPLANT
SYR TB 1ML LUER SLIP (SYRINGE) ×3 IMPLANT
TAP EXPEDIUM DL 4.35 (INSTRUMENTS) ×2 IMPLANT
TAP EXPEDIUM DL 5.0 (INSTRUMENTS) ×2 IMPLANT
TAP EXPEDIUM DL 6.0 (INSTRUMENTS) ×2 IMPLANT
TAPE CLOTH SURG 4X10 WHT LF (GAUZE/BANDAGES/DRESSINGS) ×2 IMPLANT
TRAY FOLEY MTR SLVR 16FR STAT (SET/KITS/TRAYS/PACK) ×3 IMPLANT
WATER STERILE IRR 1000ML POUR (IV SOLUTION) ×3 IMPLANT
YANKAUER SUCT BULB TIP NO VENT (SUCTIONS) ×3 IMPLANT

## 2020-07-10 NOTE — Op Note (Signed)
PATIENT NAME: [name] [name]   MEDICAL RECORD NO.:   [id number]   DATE OF BIRTH: 03/04/1951   DATE OF PROCEDURE: 07/10/2020                               OPERATIVE REPORT     PREOPERATIVE DIAGNOSES: 1. Bilateral lumbar radiculopathy. 2. L4-5 spinal stenosis. 3. L4-5 spondylolisthesis   POSTOPERATIVE DIAGNOSES: 1. Bilateral lumbar radiculopathy. 2. L4-5 spinal stenosis. 3. L4-5 spondylolisthesis   PROCEDURES: 1. Bilateral L4/5 decompression 2. Left-sided L4-5 transforaminal lumbar interbody fusion. 3. Right-sided L4-5 posterolateral fusion. 4. Insertion of interbody device x1 (52mm lordotic Concorde intervertebral spacer). 5. Placement of segmental posterior instrumentation L4, L5 bilaterally  6. Use of local autograft. 7. Use of morselized allograft - Vivigen 8. Intraoperative use of fluoroscopy.   SURGEON:  Phylliss Bob, MD.   ASSISTANTPricilla Holm, PA-C.   ANESTHESIA:  General endotracheal anesthesia.   COMPLICATIONS:  None.   DISPOSITION:  Stable.   ESTIMATED BLOOD LOSS:  100cc   INDICATIONS FOR SURGERY:  Briefly,  [name] is a pleasant 69 year old female  who did present to me with severe and ongoing pain in the right and left legs and low back. I did feel that the symptoms were secondary to the findings noted above.   The patient failed conservative care and did wish to proceed with the procedure  noted above.   OPERATIVE DETAILS:  On 07/10/2020, the patient was brought to surgery and general endotracheal anesthesia was administered.  The patient was placed prone on a well-padded flat Jackson bed with a spinal frame.  Antibiotics were given and a time-out procedure was performed. The back was prepped and draped in the usual fashion.  A midline incision was made overlying the L4-5 intervertebral spaces.  The fascia was incised at the midline.  The paraspinal musculature was bluntly swept laterally.  Anatomic landmarks for the pedicles were  exposed. Using fluoroscopy, I did cannulate the L4 and L5 pedicles bilaterally, using a medial to lateral cortical trajectory technique.  At this point, 6 x 30 mm screws were placed into the right pedicles, and a 40 mm rod was placed into the tulip heads of the screw, and caps were also placed. Distraction was then applied across the L4-5 intervertebral space, and the caps were then provisionally tightened.  On the left side, bone wax was placed into the cannulated pedicle holes.  I then proceeded with the decompressive aspect of the procedure at the L4-5 level.  A partial facetectomy was performed bilaterally at L4-5, decompressing the L4-5 intervertebral space.  I was very pleased with the decompression. With an assistant holding medial retraction of the traversing left L5 nerve, I did perform an annulotomy at the posterolateral aspect of the L4-5 intervertebral space. I then used a series of curettes and pituitary rongeurs to perform a thorough and complete intervertebral diskectomy. The intervertebral space was then liberally packed with autograft as well as allograft in the form of Vivigen, as was the appropriate-sized intervertebral spacer (10 mm short, lordotic).  The spacer was then tamped into position in the usual fashion.  I was very pleased with the press-fit of the spacer.  I then placed 6 mm screws on the left at L4 and L5. A 45-mm rod was then placed and caps were placed. The distraction was then released on the contralateral side.  All caps were then locked.  The wound was copiously  irrigated with a total of approximately 3 L prior to placing the bone graft.  Additional autograft and allograft was then packed into the posterolateral gutter on the right side to help aid in the success of the fusion.  The wound was  explored for any undue bleeding and there was no substantial bleeding encountered.  Gel-Foam was placed over the laminectomy site.  The wound was then closed in layers using  #1 Vicryl followed by 2-0 Vicryl, followed by 4-0 Monocryl.  Benzoin and Steri-Strips were applied followed by sterile dressing.    Of note, did use triggered EMG to test the screws on the left, and there was  no screw the tested below 15 mA. There was no sustained abnormal EMG activity noted throughout the surgery.   Of note, Pricilla Holm was my assistant throughout surgery, and did aid in retraction, placement of the hardware, suctioning, and closure.     Phylliss Bob, MD

## 2020-07-10 NOTE — Transfer of Care (Signed)
Immediate Anesthesia Transfer of Care Note  Patient: [name] [name]  Procedure(s) Performed: LEFT-SIDED LUMBAR FOUR-FIVE TRANSFORAMINAL LUMBAR INTERBODY FUSION WITH INSTRUMENTATION AND ALLOGRAFT (Left Spine Lumbar)  Patient Location: PACU  Anesthesia Type:General  Level of Consciousness: drowsy  Airway & Oxygen Therapy: Patient Spontanous Breathing and Patient connected to nasal cannula oxygen  Post-op Assessment: Report given to RN, Post -op Vital signs reviewed and stable and Patient moving all extremities X 4  Post vital signs: Reviewed and stable  Last Vitals:  Vitals Value Taken Time  BP 106/78 07/10/20 1120  Temp    Pulse 87 07/10/20 1122  Resp 13 07/10/20 1122  SpO2 100 % 07/10/20 1122  Vitals shown include unvalidated device data.  Last Pain:  Vitals:   07/10/20 0618  TempSrc:   PainSc: 0-No pain      Patients Stated Pain Goal: 3 (37/35/78 9784)  Complications: No complications documented.

## 2020-07-10 NOTE — Anesthesia Procedure Notes (Signed)
Procedure Name: Intubation Date/Time: 07/10/2020 7:41 AM Performed by: Colin Benton, CRNA Pre-anesthesia Checklist: Patient identified, Emergency Drugs available, Suction available and Patient being monitored Patient Re-evaluated:Patient Re-evaluated prior to induction Oxygen Delivery Method: Circle system utilized Preoxygenation: Pre-oxygenation with 100% oxygen Induction Type: IV induction Ventilation: Mask ventilation without difficulty Laryngoscope Size: Miller and 2 Grade View: Grade II Tube type: Oral Tube size: 7.0 mm Number of attempts: 1 Airway Equipment and Method: Stylet Placement Confirmation: ETT inserted through vocal cords under direct vision,  positive ETCO2 and breath sounds checked- equal and bilateral Secured at: 22 cm Tube secured with: Tape Dental Injury: Teeth and Oropharynx as per pre-operative assessment

## 2020-07-10 NOTE — H&P (Signed)
PREOPERATIVE H&P  Chief Complaint: Bilateral leg pain  HPI: [name] [name] is a 69 y.o. female who presents with ongoing pain in the bilateral legs and abck  MRI reveals spinal stenosis and instability at L4/5   Patient has failed multiple forms of conservative care and continues to have pain (see office notes for additional details regarding the patient's full course of treatment)  Past Medical History:  Diagnosis Date  . Acid reflux   . ALCOHOL ABUSE, HX OF 11/06/2007  . ALLERGIC RHINITIS 11/06/2007  . Anxiety 04/03/2011  . ASTHMA 09/13/2007  . Asthma   . Chronic pain syndrome 12/15/2016  . COLONIC POLYPS, HX OF 02/09/2008    ADENOMATOUS POLYP  . Encounter for well adult exam without abnormal findings 04/03/2011  . HYPERLIPIDEMIA 11/03/2010  . HYPERTENSION 09/13/2007  . HYPOTHYROIDISM 11/06/2007  . Impaired glucose tolerance 07/30/2013  . INSOMNIA, HX OF 09/13/2007  . Lumbar degenerative disc disease 12/15/2016  . OSTEOPOROSIS 11/06/2007  . PERIMENOPAUSAL STATUS 09/13/2007  . RLS (restless legs syndrome)   . Stroke ([place])   . TIA (transient ischemic attack) 11/04/2011   Past Surgical History:  Procedure Laterality Date  . BREAST EXCISIONAL BIOPSY     left  . BREAST SURGERY  2008 and 2012   x 2 - benign, left side  . carotid artery surgery Left   . CESAREAN SECTION     x 3  . COLONOSCOPY  multiple   2010   Social History   Socioeconomic History  . Marital status: Divorced    Spouse name: Not on file  . Number of children: 3  . Years of education: Not on file  . Highest education level: Not on file  Occupational History  . Occupation: Nurse, mental health: [employer]  Tobacco Use  . Smoking status: Former Smoker    Packs/day: 0.25    Types: Cigarettes    Quit date: 05/31/1979    Years since quitting: 41.1  . Smokeless tobacco: Current User    Types: Snuff  . Tobacco comment: social smoker  Vaping Use  . Vaping Use: Never used    Substance and Sexual Activity  . Alcohol use: No    Comment: former alcholic  . Drug use: No  . Sexual activity: Not on file  Other Topics Concern  . 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 Difficulty of Paying Living Expenses:   Food Insecurity:   . Worried About Charity fundraiser in the Last Year:   . Arboriculturist in the Last Year:   Transportation Needs:   . Film/video editor (Medical):   Marland Kitchen Lack of Transportation (Non-Medical):   Physical Activity:   . Days of Exercise per Week:   . Minutes of Exercise per Session:   Stress:   . Feeling of Stress :   Social Connections:   . Frequency of Communication with Friends and Family:   . Frequency of Social Gatherings with Friends and Family:   . Attends Religious Services:   . Active Member of Clubs or Organizations:   . Attends Archivist Meetings:   Marland Kitchen Marital Status:    Family History  Problem Relation Age of  Onset  . Hyperlipidemia Other   . Hypertension Other   . Coronary artery disease Other   . Breast cancer Cousin   . Hypertension Mother   . Heart disease Mother   . Heart failure Mother   . Dementia Maternal Grandmother   . Colon cancer Neg Hx    Allergies  Allergen Reactions  . Fosamax [Alendronate Sodium] Nausea And Vomiting  . Penicillins Hives    Did it involve swelling of the face/tongue/throat, SOB, or low BP? No Did it involve sudden or severe rash/hives, skin peeling, or any reaction on the inside of your mouth or nose? Yes Did you need to seek medical attention at a hospital or doctor's office? Yes When did it last happen?Over 10 years ago If all above answers are "NO", may proceed with cephalosporin use.   Prior to Admission medications   Medication Sig Start Date End Date  Taking? Authorizing Provider  amLODipine (NORVASC) 5 MG tablet TAKE 1 TABLET (5 MG TOTAL) BY MOUTH DAILY AFTER SUPPER. 04/09/20 07/08/20 Yes Adrian Prows, MD  atorvastatin (LIPITOR) 40 MG tablet TAKE 1 TABLET BY MOUTH DAILY. ANNUAL APPT DUE IN MAY MUST SEE PROVIDER FOR FUTURE REFILLS Patient taking differently: Take 40 mg by mouth daily.  03/22/20  Yes Marrian Salvage, FNP  benazepril-hydrochlorthiazide (LOTENSIN HCT) 10-12.5 MG tablet Take 1 tablet by mouth daily. 05/14/20  Yes Biagio Borg, MD  Calcium Carbonate-Vit D-Min (CALCIUM 1200 PO) Take 1,200 mg by mouth daily.   Yes [provider]  cholecalciferol (VITAMIN D3) 25 MCG (1000 UNIT) tablet Take 1,000 Units by mouth daily.   Yes [provider]  cilostazol (PLETAL) 100 MG tablet Take 1 tablet (100 mg total) by mouth 2 (two) times daily. 04/29/20  Yes Adrian Prows, MD  citalopram (CELEXA) 20 MG tablet Take 1 tablet (20 mg total) by mouth daily. 10/16/19 10/15/20 Yes Marrian Salvage, FNP  diphenhydrAMINE-APAP, sleep, (TYLENOL PM EXTRA STRENGTH PO) Take 2 tablets by mouth at bedtime.    Yes [provider]  ezetimibe (ZETIA) 10 MG tablet TAKE 1 TABLET (10 MG TOTAL) BY MOUTH DAILY AFTER SUPPER. 02/21/20 08/19/20 Yes Adrian Prows, MD  fluticasone furoate-vilanterol (BREO ELLIPTA) 100-25 MCG/INH AEPB Inhale 1 puff into the lungs daily. Patient taking differently: Inhale 1 puff into the lungs daily as needed (shortness of breath).  10/16/19  Yes Marrian Salvage, FNP  gabapentin (NEURONTIN) 400 MG capsule Take 1 capsule (400 mg total) by mouth at bedtime. 06/24/20  Yes Lyndal Pulley, DO  levothyroxine (SYNTHROID) 75 MCG tablet TAKE 1 TABLET (75 MCG TOTAL) BY MOUTH DAILY BEFORE BREAKFAST. 01/30/20  Yes Marrian Salvage, FNP  metoprolol succinate (TOPROL-XL) 25 MG 24 hr tablet TAKE 1 TABLET BY MOUTH EVERY DAY Patient taking differently: Take 25 mg by mouth daily.  04/24/20  Yes Biagio Borg, MD  montelukast  (SINGULAIR) 10 MG tablet TAKE 1 TABLET BY MOUTH EVERY DAY Patient taking differently: Take 10 mg by mouth daily.  11/06/19  Yes Marrian Salvage, FNP  Multiple Vitamin (MULTIVITAMIN WITH MINERALS) TABS tablet Take 1 tablet by mouth daily.   Yes [provider]  rOPINIRole (REQUIP) 0.5 MG tablet Take 1 tablet (0.5 mg total) by mouth at bedtime. 10/02/19  Yes Lomax, Amy, NP  spironolactone (ALDACTONE) 25 MG tablet TAKE 1 TABLET BY MOUTH EVERY DAY IN THE MORNING Patient taking differently: Take 25 mg by mouth daily.  04/09/20  Yes Adrian Prows, MD  zolpidem (AMBIEN) 10  MG tablet TAKE 1 TABLET BY MOUTH AT BEDTIME AS NEEDED FOR SLEEP. Patient taking differently: Take 10 mg by mouth at bedtime.  03/29/20  Yes Marrian Salvage, FNP  gabapentin (NEURONTIN) 100 MG capsule Take 2 capsules (200 mg total) by mouth at bedtime. Patient not taking: Reported on 06/27/2020 04/29/20   Marrian Salvage, FNP  pantoprazole (PROTONIX) 40 MG tablet Take 1 tablet (40 mg total) by mouth 2 (two) times daily. Patient not taking: Reported on 06/27/2020 04/29/20   Marrian Salvage, [place]     All other systems have been reviewed and were otherwise negative with the exception of those mentioned in the HPI and as above.  Physical Exam: Vitals:   07/10/20 0602  BP: (!) 105/56  Pulse: 75  Resp: 17  Temp: 98.1 F (36.7 C)  SpO2: 98%    Body mass index is 28.28 kg/m.  General: Alert, no acute distress Cardiovascular: No pedal edema Respiratory: No cyanosis, no use of accessory musculature Skin: No lesions in the area of chief complaint Neurologic: Sensation intact distally Psychiatric: Patient is competent for consent with normal mood and affect Lymphatic: No axillary or cervical lymphadenopathy  Assessment/Plan: L4/5 spinal stenosis and instability Plan for Procedure(s): LEFT-SIDED LUMBAR 4-5 TRANSFORAMINAL LUMBAR INTERBODY FUSION WITH INSTRUMENTATION AND ALLOGRAFT   Norva Karvonen,  MD 07/10/2020 7:15 AM

## 2020-07-10 NOTE — Anesthesia Postprocedure Evaluation (Signed)
Anesthesia Post Note  Patient: Vaunda B Celona  Procedure(s) Performed: LEFT-SIDED LUMBAR FOUR-FIVE TRANSFORAMINAL LUMBAR INTERBODY FUSION WITH INSTRUMENTATION AND ALLOGRAFT (Left Spine Lumbar)     Patient location during evaluation: PACU Anesthesia Type: General Level of consciousness: awake and alert Pain management: pain level controlled Vital Signs Assessment: post-procedure vital signs reviewed and stable Respiratory status: spontaneous breathing, nonlabored ventilation, respiratory function stable and patient connected to nasal cannula oxygen Cardiovascular status: blood pressure returned to baseline and stable Postop Assessment: no apparent nausea or vomiting Anesthetic complications: no   No complications documented.  Last Vitals:  Vitals:   07/10/20 1332 07/10/20 1631  BP: 120/78 (!) 105/49  Pulse: 71 71  Resp: 18 18  Temp: 36.4 C 36.6 C  SpO2:  99%    Last Pain:  Vitals:   07/10/20 1631  TempSrc: Oral  PainSc:                  Catalina Gravel

## 2020-07-11 ENCOUNTER — Encounter (HOSPITAL_COMMUNITY): Payer: Self-pay | Admitting: Orthopedic Surgery

## 2020-07-11 DIAGNOSIS — M5416 Radiculopathy, lumbar region: Secondary | ICD-10-CM | POA: Diagnosis not present

## 2020-07-11 MED ORDER — OXYCODONE-ACETAMINOPHEN 5-325 MG PO TABS: 1.0000 | ORAL_TABLET | ORAL | 0 refills | Status: DC | PRN

## 2020-07-11 MED ORDER — METHOCARBAMOL 500 MG PO TABS: 500.0000 mg | ORAL_TABLET | Freq: Four times a day (QID) | ORAL | 2 refills | Status: DC | PRN

## 2020-07-11 MED FILL — Thrombin For Soln 20000 Unit: CUTANEOUS | Qty: 1 | Status: AC

## 2020-07-11 NOTE — Evaluation (Signed)
Occupational Therapy Evaluation Patient Details Name: [name] [name] MRN: [id number] DOB: Jun 18, 1951 Today's Date: 07/11/2020    History of Present Illness Patient is a 69 year old female with PMH including ETOH abuse, anxiety, asthma, chronic pain syndrome admitted 7/28 for L4/5 decompression and fusion.   Clinical Impression   Patient with functional deficits listed below impacting safety and independence with self care. Patient experiencing 9/10 pain therefore focused primarily on dressing at bed side during session. Patient supervision with UB dressing, educate patient in figure 4 method for LB dressing to avoid bending however patient reports she cannot do this "my son will help." Patient also reports "I won't be wearing much." Patient mildly impulsive with standing/changing positions quickly due to pain. Back precaution hand out provided and educated patient how to implement during self care tasks. Patient reports she will need walker and 3 in 1 bedside commode at her son's house to maximize safety.    Follow Up Recommendations  No OT follow up;Supervision/Assistance - 24 hour (initially)    Equipment Recommendations  3 in 1 bedside commode;Other (comment) (walker)       Precautions / Restrictions Precautions Precautions: Fall;Back Precaution Booklet Issued: Yes (comment) Precaution Comments: can remove brace to shower and to ambulate to bathroom, can remove in bed, otherwise wear 6 weeks Required Braces or Orthoses: Spinal Brace Spinal Brace: Applied in sitting position;Thoracolumbosacral orthotic Restrictions Weight Bearing Restrictions: No      Mobility Bed Mobility Overal bed mobility: Needs Assistance Bed Mobility: Rolling;Sidelying to Sit;Sit to Sidelying Rolling: Supervision Sidelying to sit: Supervision     Sit to sidelying: Supervision General bed mobility comments: cues for technique getting back into bed as patient lays down quickly with noted facial  grimace  Transfers Overall transfer level: Needs assistance Equipment used: None Transfers: Sit to/from Stand Sit to Stand: Supervision         General transfer comment: for safety due to mild impulsivity, patient only taking few steps at edge of bed for dressing deferred further mobility due to pain    Balance Overall balance assessment: Mild deficits observed, not formally tested                                         ADL either performed or assessed with clinical judgement   ADL Overall ADL's : Needs assistance/impaired Eating/Feeding: Independent   Grooming: Supervision/safety;Standing Grooming Details (indicate cue type and reason): for safety due to pain Upper Body Bathing: Supervision/ safety;Sitting;Cueing for compensatory techniques   Lower Body Bathing: Moderate assistance;Sitting/lateral leans;Sit to/from stand Lower Body Bathing Details (indicate cue type and reason): to reach lower legs Upper Body Dressing : Supervision/safety;Standing;Cueing for compensatory techniques Upper Body Dressing Details (indicate cue type and reason): to don dress overhead, supervision for safety Lower Body Dressing: Moderate assistance;Sitting/lateral leans;Sit to/from stand Lower Body Dressing Details (indicate cue type and reason): patient reports cannot perform figure 4 method, states son will help as needed "I don't plan on wearing much"  Toilet Transfer: Supervision/safety;Ambulation;Cueing for safety Toilet Transfer Details (indicate cue type and reason): supervision for safety due to mild impulsivity standing up/changing position quickly due to 9/10 pain Toileting- Clothing Manipulation and Hygiene: Supervision/safety;Sit to/from stand;Sitting/lateral lean       Functional mobility during ADLs: Supervision/safety;Cueing for safety General ADL Comments: min cues to adhere to back precautions during self care tasks. provided back hand out and educated patient  how  to maintain during ADLs                  Pertinent Vitals/Pain Pain Assessment: 0-10 Pain Score: 9  Pain Location: back Pain Descriptors / Indicators: Sharp;Grimacing;Moaning;Shooting Pain Intervention(s): Patient requesting pain meds-RN notified;Ice applied;Monitored during session     Hand Dominance  (did not specify)   Extremity/Trunk Assessment Upper Extremity Assessment Upper Extremity Assessment: Overall WFL for tasks assessed   Lower Extremity Assessment Lower Extremity Assessment: Defer to PT evaluation       Communication Communication Communication: No difficulties   Cognition Arousal/Alertness: Awake/alert Behavior During Therapy: Impulsive (standing up quickly due to pain) Overall Cognitive Status: Within Functional Limits for tasks assessed                                                Home Living Family/patient expects to be discharged to:: Other (Comment) (son's house) Living Arrangements: Children;Other (Comment) (grandson) Available Help at Discharge: Family Type of Home: House Home Access: Stairs to enter [employer] of Steps: 2    Home Layout: Two level;Bed/bath upstairs;Other (Comment) (pt prefers upstairs bedroom does have downstair availability) Alternate Level Stairs-Number of Steps: 13   Bathroom Shower/Tub: Occupational psychologist: Standard     Home Equipment: None          Prior Functioning/Environment Level of Independence: Independent                 OT Problem List: Decreased activity tolerance;Impaired balance (sitting and/or standing);Decreased safety awareness;Decreased knowledge of use of DME or AE;Decreased knowledge of precautions;Pain      OT Treatment/Interventions: Self-care/ADL training;DME and/or AE instruction;Patient/family education    OT Goals(Current goals can be found in the care plan section) Acute Rehab OT Goals Patient Stated Goal: "I'm hurting" OT Goal  Formulation: With patient Time For Goal Achievement: 07/25/20 Potential to Achieve Goals: Good  OT Frequency: Min 2X/week    AM-PAC OT "6 Clicks" Daily Activity     Outcome Measure Help from another person eating meals?: None Help from another person taking care of personal grooming?: A Little Help from another person toileting, which includes using toliet, bedpan, or urinal?: A Little Help from another person bathing (including washing, rinsing, drying)?: A Lot Help from another person to put on and taking off regular upper body clothing?: A Little Help from another person to put on and taking off regular lower body clothing?: A Lot 6 Click Score: 17   End of Session Nurse Communication: Mobility status;Patient requests pain meds  Activity Tolerance: Patient limited by pain;Patient tolerated treatment well Patient left: in bed;with call bell/phone within reach  OT Visit Diagnosis: Pain Pain - part of body:  (back)                Time: 7253-6644 OT Time Calculation (min): 20 min Charges:  OT General Charges $OT Visit: 1 Visit OT Evaluation $OT Eval Low Complexity: [place] OT OT office: [phone]   Rosemary Holms 07/11/2020, 8:47 AM

## 2020-07-11 NOTE — Evaluation (Signed)
Physical Therapy Evaluation Patient Details Name: [name] [name] MRN: [id number] DOB: 05/12/1951 Today's Date: 07/11/2020   History of Present Illness  Patient is a 69 year old female with PMH including ETOH abuse, anxiety, asthma, HTN, RLS and CVA admitted 7/28 for L4/5 decompression and fusion.    Clinical Impression  Pt presented supine in bed with HOB elevated, awake and willing to participate in therapy session. Prior to admission, pt reported that she was independent with all functional mobility and ADLs. Pt plans to d/c to her son's home where she will have 24/7 supervision/assistance if needed. At the time of evaluation, pt overall limited secondary to pain (reported 10/10). However, she was able to participate in bed mobility with supervision, transfers with min guard and hallway ambulation with use of RW and close min guard. Pt requiring occasional min A for safety and stability during ambulation. Additionally, pt participated in stair training this session with min A. Pt would continue to benefit from skilled physical therapy services at this time while admitted and after d/c to address the below listed limitations in order to improve overall safety and independence with functional mobility.     Follow Up Recommendations Home health PT;Supervision/Assistance - 24 hour    Equipment Recommendations  Rolling walker with 5" wheels;3in1 (PT);Other (comment) (YOUTH SIZE RW)    Recommendations for Other Services       Precautions / Restrictions Precautions Precautions: Fall;Back Precaution Booklet Issued: Yes (comment) Precaution Comments: reviewed back precautions with pt throughout Required Braces or Orthoses: Spinal Brace Spinal Brace: Applied in sitting position;Thoracolumbosacral orthotic Restrictions Weight Bearing Restrictions: No      Mobility  Bed Mobility Overal bed mobility: Needs Assistance Bed Mobility: Sidelying to Sit;Sit to Sidelying Rolling:  Supervision Sidelying to sit: Supervision     Sit to sidelying: Supervision General bed mobility comments: good technique utilized, supervision for safety  Transfers Overall transfer level: Needs assistance Equipment used: None;Rolling walker (2 wheeled) Transfers: Sit to/from Stand Sit to Stand: Min guard         General transfer comment: for safety as pt impulsive throughout  Ambulation/Gait Ambulation/Gait assistance: Min guard;Min assist Gait Distance (Feet): 200 Feet Assistive device: Rolling walker (2 wheeled) Gait Pattern/deviations: Step-through pattern;Decreased step length - left;Decreased step length - right;Decreased stride length;Drifts right/left Gait velocity: reduced   General Gait Details: pt with modest instability requiring close min guard and occasional min A for stability and safety with use of RW  Stairs Stairs: Yes Stairs assistance: Min assist Stair Management: One rail Left;Alternating pattern;Step to pattern;Forwards Number of Stairs: 4 General stair comments: pt very unsteady requiring use of hand rail, 1HHA and min A for safety and stability; recommended pt have her son with her when she ascends/descends stairs at home  Wheelchair Mobility    Modified Rankin (Stroke Patients Only)       Balance Overall balance assessment: Needs assistance Sitting-balance support: Feet supported Sitting balance-Leahy Scale: Fair     Standing balance support: During functional activity;Single extremity supported;Bilateral upper extremity supported Standing balance-Leahy Scale: Poor                               Pertinent Vitals/Pain Pain Assessment: 0-10 Pain Score: 10-Worst pain ever Pain Location: back and L hip/groin Pain Descriptors / Indicators: Grimacing;Guarding Pain Intervention(s): Monitored during session    Home Living Family/patient expects to be discharged to:: Other (Comment) (son's house) Living Arrangements:  Children;Other (Comment) (  grandson) Available Help at Discharge: Family;Available 24 hours/day Type of Home: House Home Access: Stairs to enter   [employer] of Steps: 2  Home Layout: Two level;Bed/bath upstairs;Other (Comment) (pt prefers upstairs bedroom does have downstair availability) Home Equipment: None      Prior Function Level of Independence: Independent               Hand Dominance   Dominant Hand:  (did not specify)    Extremity/Trunk Assessment   Upper Extremity Assessment Upper Extremity Assessment: Defer to OT evaluation;Overall WFL for tasks assessed    Lower Extremity Assessment Lower Extremity Assessment: Overall WFL for tasks assessed    Cervical / Trunk Assessment Cervical / Trunk Assessment: Other exceptions Cervical / Trunk Exceptions: s/p lumbar sx  Communication   Communication: No difficulties  Cognition Arousal/Alertness: Awake/alert Behavior During Therapy: Impulsive Overall Cognitive Status: Impaired/Different from baseline Area of Impairment: Safety/judgement;Memory                     Memory: Decreased short-term memory;Decreased recall of precautions   Safety/Judgement: Decreased awareness of safety;Decreased awareness of deficits            General Comments      Exercises     Assessment/Plan    PT Assessment Patient needs continued PT services  PT Problem List Decreased strength;Decreased range of motion;Decreased activity tolerance;Decreased balance;Decreased mobility;Decreased coordination;Decreased cognition;Decreased knowledge of use of DME;Decreased safety awareness;Decreased knowledge of precautions;Pain       PT Treatment Interventions DME instruction;Gait training;Stair training;Functional mobility training;Therapeutic activities;Therapeutic exercise;Balance training;Neuromuscular re-education;Cognitive remediation;Patient/family education    PT Goals (Current goals can be found in the Care  Plan section)  Acute Rehab PT Goals Patient Stated Goal: decrease pain PT Goal Formulation: With patient Time For Goal Achievement: 07/25/20 Potential to Achieve Goals: Good    Frequency Min 5X/week   Barriers to discharge        Co-evaluation               AM-PAC PT "6 Clicks" Mobility  Outcome Measure Help needed turning from your back to your side while in a flat bed without using bedrails?: None Help needed moving from lying on your back to sitting on the side of a flat bed without using bedrails?: None Help needed moving to and from a bed to a chair (including a wheelchair)?: A Little Help needed standing up from a chair using your arms (e.g., wheelchair or bedside chair)?: None Help needed to walk in hospital room?: A Little Help needed climbing 3-5 steps with a railing? : A Little 6 Click Score: 21    End of Session Equipment Utilized During Treatment: Gait belt;Back brace Activity Tolerance: Patient limited by pain Patient left: in bed;with call bell/phone within reach Nurse Communication: Mobility status PT Visit Diagnosis: Other abnormalities of gait and mobility (R26.89)    Time: 2683-4196 PT Time Calculation (min) (ACUTE ONLY): 18 min   Charges:   PT Evaluation $PT Eval Moderate Complexity: 1 Mod          Eduard Clos, PT, DPT  Acute Rehabilitation Services Pager [phone] Office [place] 07/11/2020, 10:06 AM

## 2020-07-11 NOTE — Plan of Care (Signed)
Pt doing well. Pt and son given D/C instructions with verbal understanding. Rx's were sent to the pharmacy by MD. Pt's incision is clean and dry with no sign of infection. Pt's IV was removed prior to D/C. Pt's Home Health was arranged by TOC. Pt received equipment for home use per MD order. Pt D/C'd home via wheelchair per MD order. Pt is stable @ D/C and has no other needs at this time. Holli Humbles, RN

## 2020-07-11 NOTE — TOC Transition Note (Signed)
Transition of Care [facility]) - CM/SW Discharge Note   Patient Details  Name: [name] [name] MRN: [id number] Date of Birth: August 17, 1951  Transition of [facility]) CM/SW Contact:  Verdell Carmine, RN Phone Number: 07/11/2020, 1:41 PM   Clinical Narrative:    Awaiting rolator and 3:1 from [employer], son will be picking her up. She is set up with [facility] they are aware of address change to [facility].    Final next level of care: Home w Home Health Services Barriers to Discharge: No Barriers Identified   Patient Goals and CMS Choice Patient states their goals for this hospitalization and ongoing recovery are:: HOme with home ehalth   Choice offered to / list presented to : Patient  Discharge Placement                Home to sons house.  With Home health PT        Discharge Plan and Services   Discharge Planning Services: CM Consult Post Acute Care Choice: Durable Medical Equipment, Home Health          DME Arranged: 3-N-1, Walker rolling with seat DME Agency: [employer] Date DME Agency Contacted: 07/11/20 Time DME Agency Contacted: [phone]   [place] Arranged: PT HH Agency: [place] Date [facility] Agency Contacted: 07/11/20 Time [place] Agency Contacted: [phone] Representative spoke with at [place]: Beryle Beams  Social Determinants of Health (SDOH) Interventions     Readmission Risk Interventions No flowsheet data found.

## 2020-07-11 NOTE — Progress Notes (Signed)
    Patient doing well Patient denies leg pain Has been ambulating   Physical Exam: Vitals:   07/10/20 2349 07/11/20 0529  BP: 102/79 (!) 102/61  Pulse: 70 75  Resp: 18 18  Temp: 98.3 F (36.8 C) 98.7 F (37.1 C)  SpO2: 98% 95%    Dressing in place NVI  POD #1 s/p L4/5 decompression and fusion, doing well  - up with PT/OT, encourage ambulation - Percocet for pain, Robaxin for muscle spasms - likely d/c home today with f/u in 2 weeks

## 2020-07-11 NOTE — TOC Initial Note (Addendum)
Transition of Care [facility]) - Initial/Assessment Note    Patient Details  Name: [name] [name] MRN: [id number] Date of Birth: [birth year]  [facility]) CM/SW Contact:    Verdell Carmine, RN Phone Number: 07/11/2020, 12:16 PM  Clinical Narrative:                Rollator and 3:1 set up with [employer]. PING used to identify that the patient has not previously used services Phone call to room was not picked up set up services Spoke with patient at bedside, she willl go with [facility]. Set up services with [facility] PT.  Patient will be going to her sons house for supervision at [address] at [employer], [employer] receive 3:1 and Rollator to take home from hospital. .   Expected Discharge Plan: [place] Barriers to Discharge: No Barriers Identified   Patient Goals and CMS Choice Patient states their goals for this hospitalization and ongoing recovery are:: HOme with home ehalth   Choice offered to / list presented to : Patient  Expected Discharge Plan and Services Expected Discharge Plan: [place]   Discharge Planning Services: CM Consult Post Acute Care Choice: Durable Medical Equipment, Home Health   Expected Discharge Date: 07/11/20               DME Arranged: 3-N-1, Walker rolling with seat DME Agency: [employer] Date DME Agency Contacted: 07/11/20 Time DME Agency Contacted: [phone]   [place] Arranged: PT HH Agency: [place] Date [facility] Agency Contacted: 07/11/20 Time HH Agency Contacted: [phone] Representative spoke with at [place]: Beryle Beams  Prior Living Arrangements/Services     Patient language and need for interpreter reviewed:: Yes Do you feel safe going back to the place where you live?: Yes            Criminal Activity/Legal Involvement Pertinent to Current Situation/Hospitalization: No - Comment as needed  Activities of Daily Living      Permission Sought/Granted                  Emotional  Assessment Appearance:: Appears stated age     Orientation: : Oriented to  Time, Oriented to Place, Oriented to Self, Oriented to Situation Alcohol / Substance Use: Not Applicable Psych Involvement: No (comment)  Admission diagnosis:  Neurogenic claudication [M48.062] Patient Active Problem List   Diagnosis Date Noted  . Neurogenic claudication 07/10/2020  . Degenerative lumbar spinal stenosis 05/16/2020  . Sacroiliac pain 08/10/2019  . Anxiety and depression 05/11/2019  . Chest pain 05/09/2019  . Arthritis of sacroiliac joint of both sides 05/01/2019  . Polyarthralgia 12/28/2018  . Preventative health care 10/20/2018  . RLS (restless legs syndrome) 09/27/2018  . Insomnia disorder related to known organic factor 06/27/2018  . PLMD (periodic limb movement disorder) 06/27/2018  . Hot flashes due to menopause 04/25/2018  . Palpitations 04/25/2018  . Diaphoresis 04/25/2018  . Long term current use of antithrombotics/antiplatelets 04/19/2018  . Insomnia 04/19/2018  . Intractable episodic headache 02/28/2018  . Numbness and tingling of right arm 02/28/2018  . Toe pain, right 10/19/2017  . Rotator cuff arthropathy of left shoulder 06/02/2017  . Trigger thumb of left hand 06/02/2017  . Lipoma 05/06/2017  . Pain of left thumb 05/06/2017  . Chronic pain syndrome 12/15/2016  . Lumbar degenerative disc disease 12/15/2016  . Left leg pain 01/28/2016  . Left shoulder pain 04/09/2015  . Impaired glucose tolerance 07/30/2013  .  Abnormal breath sounds 07/30/2013  . Left carotid stenosis 05/05/2012  . Anxiety 04/03/2011  . CVA (cerebral infarction) 03/15/2011  . HLD (hyperlipidemia) 11/03/2010  . Hx of adenomatous colonic polyps 02/09/2008  . Hypothyroidism 11/06/2007  . ALLERGIC RHINITIS 11/06/2007  . OSTEOPOROSIS 11/06/2007  . ALCOHOL ABUSE, HX OF 11/06/2007  . Essential hypertension 09/13/2007  . Asthma 09/13/2007  . PERIMENOPAUSAL STATUS 09/13/2007  . Chronic insomnia 09/13/2007    PCP:  Marrian Salvage, FNP Pharmacy:   [id number] - [place], [place] [address] [place] [address] Phone: [phone] Fax: [phone]     Social Determinants of Health (SDOH) Interventions    Readmission Risk Interventions No flowsheet data found.

## 2020-07-13 ENCOUNTER — Other Ambulatory Visit: Payer: Self-pay | Admitting: Cardiology

## 2020-07-13 DIAGNOSIS — I10 Essential (primary) hypertension: Secondary | ICD-10-CM

## 2020-07-16 ENCOUNTER — Ambulatory Visit (INDEPENDENT_AMBULATORY_CARE_PROVIDER_SITE_OTHER): Payer: Medicare PPO | Admitting: Psychology

## 2020-07-16 DIAGNOSIS — F330 Major depressive disorder, recurrent, mild: Secondary | ICD-10-CM

## 2020-07-21 ENCOUNTER — Other Ambulatory Visit: Payer: Self-pay | Admitting: Cardiology

## 2020-07-21 DIAGNOSIS — I739 Peripheral vascular disease, unspecified: Secondary | ICD-10-CM

## 2020-07-23 ENCOUNTER — Ambulatory Visit: Payer: Self-pay | Admitting: Psychology

## 2020-07-30 ENCOUNTER — Ambulatory Visit (INDEPENDENT_AMBULATORY_CARE_PROVIDER_SITE_OTHER): Payer: Medicare PPO | Admitting: Psychology

## 2020-07-30 DIAGNOSIS — F331 Major depressive disorder, recurrent, moderate: Secondary | ICD-10-CM

## 2020-08-05 ENCOUNTER — Other Ambulatory Visit: Payer: Self-pay

## 2020-08-05 ENCOUNTER — Ambulatory Visit: Payer: Medicare PPO | Admitting: Cardiology

## 2020-08-05 ENCOUNTER — Encounter: Payer: Self-pay | Admitting: Cardiology

## 2020-08-05 VITALS — BP 105/69 | Resp 15 | Ht 59.0 in | Wt 155.0 lb

## 2020-08-05 DIAGNOSIS — I6523 Occlusion and stenosis of bilateral carotid arteries: Secondary | ICD-10-CM

## 2020-08-05 DIAGNOSIS — E7800 Pure hypercholesterolemia, unspecified: Secondary | ICD-10-CM

## 2020-08-05 DIAGNOSIS — I739 Peripheral vascular disease, unspecified: Secondary | ICD-10-CM

## 2020-08-05 DIAGNOSIS — I10 Essential (primary) hypertension: Secondary | ICD-10-CM

## 2020-08-05 MED ORDER — ASPIRIN EC 81 MG PO TBEC: 81.0000 mg | DELAYED_RELEASE_TABLET | Freq: Every day | ORAL | 3 refills | Status: DC

## 2020-08-05 MED ORDER — CILOSTAZOL 100 MG PO TABS: 100.0000 mg | ORAL_TABLET | Freq: Two times a day (BID) | ORAL | 3 refills | Status: DC

## 2020-08-05 NOTE — Progress Notes (Signed)
Primary Physician/Referring:  Marrian Salvage, FNP  Patient ID: [name] [name], female    DOB: 12-Feb-1951, 69 y.o.   MRN: [id number]  Chief Complaint  Patient presents with  . Follow-up    3 month  . PAD  . Hypertension   HPI:    [name] [name]  is a 69 y.o. female  with history of left carotid endarterectomy due to symptomatic carotid stenosis in 2012 with TIA, asymptomatic right carotid stenosis, bronchial asthma, hypertension, hyperlipidemia, hyperglycemia presents for 3 month OV.   Underwent back surgery in July 2021 with resolution of back pain. Her main complaint continues to be claudication right leg worse than left.  She had difficult to control hypertension which is now well controlled on spironolactone, hypokalemia is resolved.    Past Medical History:  Diagnosis Date  . Acid reflux   . ALCOHOL ABUSE, HX OF 11/06/2007  . ALLERGIC RHINITIS 11/06/2007  . Anxiety 04/03/2011  . ASTHMA 09/13/2007  . Asthma   . Chronic pain syndrome 12/15/2016  . COLONIC POLYPS, HX OF 02/09/2008    ADENOMATOUS POLYP  . Encounter for well adult exam without abnormal findings 04/03/2011  . HYPERLIPIDEMIA 11/03/2010  . HYPERTENSION 09/13/2007  . HYPOTHYROIDISM 11/06/2007  . Impaired glucose tolerance 07/30/2013  . INSOMNIA, HX OF 09/13/2007  . Lumbar degenerative disc disease 12/15/2016  . OSTEOPOROSIS 11/06/2007  . PERIMENOPAUSAL STATUS 09/13/2007  . RLS (restless legs syndrome)   . Stroke ([place])   . TIA (transient ischemic attack) 11/04/2011   Past Surgical History:  Procedure Laterality Date  . BREAST EXCISIONAL BIOPSY     left  . BREAST SURGERY  2008 and 2012   x 2 - benign, left side  . carotid artery surgery Left   . CESAREAN SECTION     x 3  . COLONOSCOPY  multiple   2010  . TRANSFORAMINAL LUMBAR INTERBODY FUSION (TLIF) WITH PEDICLE SCREW FIXATION 1 LEVEL Left 07/10/2020   Procedure: LEFT-SIDED LUMBAR FOUR-FIVE TRANSFORAMINAL LUMBAR INTERBODY FUSION WITH INSTRUMENTATION AND  ALLOGRAFT;  Surgeon: Phylliss Bob, MD;  Location: [place];  Service: Orthopedics;  Laterality: Left;   Family History  Problem Relation Age of Onset  . Hyperlipidemia Other   . Hypertension Other   . Coronary artery disease Other   . Breast cancer Cousin   . Hypertension Mother   . Heart disease Mother   . Heart failure Mother   . Dementia Maternal Grandmother   . Colon cancer Neg Hx     Social History   Tobacco Use  . Smoking status: Former Smoker    Packs/day: 0.25    Types: Cigarettes    Quit date: 05/31/1979    Years since quitting: 41.2  . Smokeless tobacco: Current User    Types: Snuff  . Tobacco comment: social smoker  Substance Use Topics  . Alcohol use: No    Comment: former alcholic   Marital Status: Divorced  ROS  Review of Systems  Cardiovascular: Positive for claudication. Negative for dyspnea on exertion and leg swelling.  Respiratory: Positive for wheezing (occasional).   Musculoskeletal: Positive for arthritis and back pain (immproved since surgery).  Gastrointestinal: Negative for melena.  All other systems reviewed and are negative.  Objective  Blood pressure 105/69, resp. rate 15, height 4\' 11"  (1.499 m), weight 155 lb (70.3 kg), SpO2 98 %.  Vitals with BMI 08/05/2020 07/11/2020 07/11/2020  Height 4\' 11"  - -  Weight 155 lbs - -  BMI 31.29 - -  Systolic 093 96 818  Diastolic 69 56 56  Pulse - 78 73     Physical Exam Constitutional:      General: She is not in acute distress.    Comments: Short stature  HENT:     Head: Atraumatic.  Cardiovascular:     Rate and Rhythm: Normal rate and regular rhythm.     Pulses: Intact distal pulses.          Carotid pulses are on the right side with bruit.      Popliteal pulses are 0 on the right side and 0 on the left side.       Dorsalis pedis pulses are 1+ on the right side and 1+ on the left side.       Posterior tibial pulses are 0 on the right side and 0 on the left side.     Heart sounds: S1 normal and  S2 normal. No murmur heard.  No gallop.      Comments: Left carotid endarterectomy scar noted Pulmonary:     Effort: Pulmonary effort is normal.     Breath sounds: Normal breath sounds.  Abdominal:     General: Bowel sounds are normal.     Palpations: Abdomen is soft.    Laboratory examination:   Recent Labs    10/17/19 1403 07/04/20 1155 07/10/20 0700  NA 136 138 138  K 4.6 4.0 3.5  CL 100 103 105  CO2 26 25 24   GLUCOSE 104* 112* 123*  BUN 13 17 19   CREATININE 0.99 1.09* 1.07*  CALCIUM 9.5 9.6 9.6  GFRNONAA 59* 52* 53*  GFRAA 68 >60 >60   CrCl cannot be calculated (Patient's most recent lab result is older than the maximum 21 days allowed.).  CMP Latest Ref Rng & Units 07/10/2020 07/04/2020 10/17/2019  Glucose 70 - 99 mg/dL 123(H) 112(H) 104(H)  BUN 8 - 23 mg/dL 19 17 13   Creatinine 0.44 - 1.00 mg/dL 1.07(H) 1.09(H) 0.99  Sodium 135 - 145 mmol/L 138 138 136  Potassium 3.5 - 5.1 mmol/L 3.5 4.0 4.6  Chloride 98 - 111 mmol/L 105 103 100  CO2 22 - 32 mmol/L 24 25 26   Calcium 8.9 - 10.3 mg/dL 9.6 9.6 9.5  Total Protein 6.5 - 8.1 g/dL 6.6 - 6.3  Total Bilirubin 0.3 - 1.2 mg/dL 0.4 - 0.5  Alkaline Phos 38 - 126 U/L 87 - 105  AST 15 - 41 U/L 21 - 16  ALT 0 - 44 U/L 19 - 14   CBC Latest Ref Rng & Units 07/10/2020 07/04/2020 05/09/2019  WBC 4.0 - 10.5 K/uL 10.1 7.7 6.3  Hemoglobin 12.0 - 15.0 g/dL 11.8(L) 12.2 12.9  Hematocrit 36 - 46 % 37.8 38.6 39.1  Platelets 150 - 400 K/uL 245 244 216   Lipid Panel     Component Value Date/Time   CHOL 144 10/17/2019 1403   TRIG 48 10/17/2019 1403   TRIG 50 10/29/2006 1025   HDL 64 10/17/2019 1403   CHOLHDL 3 05/01/2019 1312   VLDL 14.8 05/01/2019 1312   LDLCALC 69 10/17/2019 1403   LDLDIRECT 72 10/17/2019 1403   LDLDIRECT 127.1 10/24/2010 0910   HEMOGLOBIN A1C Lab Results  Component Value Date   HGBA1C 6.4 10/16/2019   MPG 128 (H) 03/04/2011   TSH Recent Labs    10/16/19 1212  TSH 0.95   Medications and allergies    Allergies  Allergen Reactions  . Fosamax [Alendronate Sodium] Nausea And Vomiting  .  Penicillins Hives    Did it involve swelling of the face/tongue/throat, SOB, or low BP? No Did it involve sudden or severe rash/hives, skin peeling, or any reaction on the inside of your mouth or nose? Yes Did you need to seek medical attention at a hospital or doctor's office? Yes When did it last happen?Over 10 years ago If all above answers are "NO", may proceed with cephalosporin use.     Current Outpatient Medications  Medication Instructions  . amLODipine (NORVASC) 5 mg, Oral, Daily after supper  . aspirin EC 81 mg, Oral, Daily  . atorvastatin (LIPITOR) 40 MG tablet TAKE 1 TABLET BY MOUTH DAILY. ANNUAL APPT DUE IN MAY MUST SEE PROVIDER FOR FUTURE REFILLS  . benazepril-hydrochlorthiazide (LOTENSIN HCT) 10-12.5 MG tablet 1 tablet, Oral, Daily  . Calcium Carbonate-Vit D-Min (CALCIUM 1200 PO) 1,200 mg, Oral, Daily  . cholecalciferol (VITAMIN D3) 1,000 Units, Oral, Daily  . cilostazol (PLETAL) 100 mg, Oral, 2 times daily  . citalopram (CELEXA) 20 mg, Oral, Daily  . diphenhydrAMINE-APAP, sleep, (TYLENOL PM EXTRA STRENGTH PO) 2 tablets, Oral, Daily at bedtime  . ezetimibe (ZETIA) 10 mg, Oral, Daily after supper  . fluticasone furoate-vilanterol (BREO ELLIPTA) 100-25 MCG/INH AEPB 1 puff, Inhalation, Daily  . levothyroxine (SYNTHROID) 75 mcg, Oral, Daily before breakfast  . methocarbamol (ROBAXIN) 500 mg, Oral, Every 6 hours PRN  . metoprolol succinate (TOPROL-XL) 25 MG 24 hr tablet TAKE 1 TABLET BY MOUTH EVERY DAY  . montelukast (SINGULAIR) 10 MG tablet TAKE 1 TABLET BY MOUTH EVERY DAY  . Multiple Vitamin (MULTIVITAMIN WITH MINERALS) TABS tablet 1 tablet, Oral, Daily  . pantoprazole (PROTONIX) 40 mg, Oral, 2 times daily  . rOPINIRole (REQUIP) 0.5 mg, Oral, Daily at bedtime  . spironolactone (ALDACTONE) 25 MG tablet TAKE 1 TABLET BY MOUTH EVERY DAY IN THE MORNING  . zolpidem (AMBIEN) 10 MG tablet  TAKE 1 TABLET BY MOUTH AT BEDTIME AS NEEDED FOR SLEEP.   Radiology:   No results found.  Cardiac Studies:   Echocardiogram 03/12/5187:  Normal systolic function, EF 41%.  Mild LVH.  Grade 2 diastolic dysfunction.  Moderately dilated left atrium. Mild thickening of mitral valve with mild calcification.  Mild aortic valve weakening and calcification.  Event Monitor for 14 days Start date 06/02/2019:  Predominant rhythm is normal sinus rhythm.:  Symptomatic transmissions of flutter and chest pain revealed normal sinus rhythm.  Automatic detected event revealed one episode of 12 beat (8 Sec) wide complex tachycardia suggestive of NSVT at 116 bpm at 2:30 PM on 06/10/2019.  Lexiscan Myoview Stress Test 06/26/2019: Resting EKG demonstrates sinus rhythm with short PR interval, borderline criteria for LVH.   stress EKG is nondiagnostic for ischemia as his of alcoholic stress test. Symptoms during test were SOB and Dizziness.  Myocardial pefusion imaging is normal. Left ventricular ejection fraction is  67% with normal wall motion. Low risk study.  Lower Extremity Arterial Duplex 04/22/2020:  Right mid SFA has monophasic waveform suggests diffuse disease and may  indicate hemodynamically significant stenosis.  No hemodynamically significant stenosis is identified in the left lower  extremity arterial system.  This exam reveals mildly decreased perfusion of the right lower extremity,  noted at the dorsalis pedis and post tibial artery level (ABI 0.93) and  mildly decreased perfusion of the left lower extremity, noted at the  dorsalis pedis and post tibial artery level (ABI 0.93).   Carotid artery duplex 04/22/2020:  Stenosis in the right internal carotid artery (16-49%).  Minimal stenosis  in the left internal carotid artery (minimal). Patent  left endarterectomy site.  Antegrade right vertebral artery flow. Antegrade left vertebral artery flow.  Follow up in one year is appropriate if clinically  indicated. No  significant change from 07/07/2019   EKG  02/05/20: Sinus Rhythm. Nonspecific T-abnormality. Abnormal.  05/31/2019: Sinus rhythm with short PR interval, PR interval 116 ms.  Possible left atrial abnormality.  No evidence of ischemia, normal QT interval.  Assessment     ICD-10-CM   1. Essential hypertension  I10 CBC    Basic metabolic panel    CANCELED: EKG 12-Lead  2. Claudication in peripheral vascular disease (HCC)  I73.9 cilostazol (PLETAL) 100 MG tablet    aspirin EC 81 MG tablet  3. Asymptomatic bilateral carotid artery stenosis  I65.23   4. Hypercholesteremia  E78.00 Lipid Panel With LDL/HDL Ratio    Meds ordered this encounter  Medications  . cilostazol (PLETAL) 100 MG tablet    Sig: Take 1 tablet (100 mg total) by mouth 2 (two) times daily.    Dispense:  60 tablet    Refill:  3    Discontinue Plavix  . aspirin EC 81 MG tablet    Sig: Take 1 tablet (81 mg total) by mouth daily.    Dispense:  90 tablet    Refill:  3    Medications Discontinued During This Encounter  Medication Reason  . HYDROcodone-acetaminophen (NORCO/VICODIN) 5-325 MG tablet Side effect (s)  . oxyCODONE-acetaminophen (PERCOCET/ROXICET) 5-325 MG tablet Patient Preference  . gabapentin (NEURONTIN) 400 MG capsule No longer needed (for PRN medications)    Recommendations:   [name] [name]  is a  69 y.o. AAFpatient  with history of left carotid endarterectomy due to symptomatic carotid stenosis in 2012 with TIA, asymptomatic right carotid stenosis, bronchial asthma, hypertension, hyperlipidemia, hyperglycemia presents for follow-up of claudication. Underwent back surgery in July 2021 with resolution of back pain.   Her main complaint continues to be claudication right leg worse than left. I reviewed her duplex, she does have probably significant stenosis in the right SFA  Otherwise her risk factors are well controlled including hypertension hyperlipidemia.  Will schedule for peripheral  arteriogram and possible angioplasty given symptoms. Patient understands the risks, benefits, alternatives including medical therapy, CT angiography. Patient understands <1-2% risk of death, embolic complications, bleeding, infection, renal failure, urgent surgical revascularization, but not limited to these and wants to proceed.   Check labs and lipids.   Adrian Prows, MD, [facility] 08/05/2020, [place] AM Office: [phone]

## 2020-08-05 NOTE — H&P (View-Only) (Signed)
Primary Physician/Referring:  Marrian Salvage, FNP  Patient ID: [name] [name], female    DOB: 09-14-1951, 69 y.o.   MRN: [id number]  Chief Complaint  Patient presents with  . Follow-up    3 month  . PAD  . Hypertension   HPI:    [name] [name]  is a 69 y.o. female  with history of left carotid endarterectomy due to symptomatic carotid stenosis in 2012 with TIA, asymptomatic right carotid stenosis, bronchial asthma, hypertension, hyperlipidemia, hyperglycemia presents for 3 month OV.   Underwent back surgery in July 2021 with resolution of back pain. Her main complaint continues to be claudication right leg worse than left.  She had difficult to control hypertension which is now well controlled on spironolactone, hypokalemia is resolved.    Past Medical History:  Diagnosis Date  . Acid reflux   . ALCOHOL ABUSE, HX OF 11/06/2007  . ALLERGIC RHINITIS 11/06/2007  . Anxiety 04/03/2011  . ASTHMA 09/13/2007  . Asthma   . Chronic pain syndrome 12/15/2016  . COLONIC POLYPS, HX OF 02/09/2008    ADENOMATOUS POLYP  . Encounter for well adult exam without abnormal findings 04/03/2011  . HYPERLIPIDEMIA 11/03/2010  . HYPERTENSION 09/13/2007  . HYPOTHYROIDISM 11/06/2007  . Impaired glucose tolerance 07/30/2013  . INSOMNIA, HX OF 09/13/2007  . Lumbar degenerative disc disease 12/15/2016  . OSTEOPOROSIS 11/06/2007  . PERIMENOPAUSAL STATUS 09/13/2007  . RLS (restless legs syndrome)   . Stroke ([place])   . TIA (transient ischemic attack) 11/04/2011   Past Surgical History:  Procedure Laterality Date  . BREAST EXCISIONAL BIOPSY     left  . BREAST SURGERY  2008 and 2012   x 2 - benign, left side  . carotid artery surgery Left   . CESAREAN SECTION     x 3  . COLONOSCOPY  multiple   2010  . TRANSFORAMINAL LUMBAR INTERBODY FUSION (TLIF) WITH PEDICLE SCREW FIXATION 1 LEVEL Left 07/10/2020   Procedure: LEFT-SIDED LUMBAR FOUR-FIVE TRANSFORAMINAL LUMBAR INTERBODY FUSION WITH INSTRUMENTATION AND  ALLOGRAFT;  Surgeon: Phylliss Bob, MD;  Location: [place];  Service: Orthopedics;  Laterality: Left;   Family History  Problem Relation Age of Onset  . Hyperlipidemia Other   . Hypertension Other   . Coronary artery disease Other   . Breast cancer Cousin   . Hypertension Mother   . Heart disease Mother   . Heart failure Mother   . Dementia Maternal Grandmother   . Colon cancer Neg Hx     Social History   Tobacco Use  . Smoking status: Former Smoker    Packs/day: 0.25    Types: Cigarettes    Quit date: 05/31/1979    Years since quitting: 41.2  . Smokeless tobacco: Current User    Types: Snuff  . Tobacco comment: social smoker  Substance Use Topics  . Alcohol use: No    Comment: former alcholic   Marital Status: Divorced  ROS  Review of Systems  Cardiovascular: Positive for claudication. Negative for dyspnea on exertion and leg swelling.  Respiratory: Positive for wheezing (occasional).   Musculoskeletal: Positive for arthritis and back pain (immproved since surgery).  Gastrointestinal: Negative for melena.  All other systems reviewed and are negative.  Objective  Blood pressure 105/69, resp. rate 15, height 4\' 11"  (1.499 m), weight 155 lb (70.3 kg), SpO2 98 %.  Vitals with BMI 08/05/2020 07/11/2020 07/11/2020  Height 4\' 11"  - -  Weight 155 lbs - -  BMI 31.29 - -  Systolic 165 96 790  Diastolic 69 56 56  Pulse - 78 73     Physical Exam Constitutional:      General: She is not in acute distress.    Comments: Short stature  HENT:     Head: Atraumatic.  Cardiovascular:     Rate and Rhythm: Normal rate and regular rhythm.     Pulses: Intact distal pulses.          Carotid pulses are on the right side with bruit.      Popliteal pulses are 0 on the right side and 0 on the left side.       Dorsalis pedis pulses are 1+ on the right side and 1+ on the left side.       Posterior tibial pulses are 0 on the right side and 0 on the left side.     Heart sounds: S1 normal and  S2 normal. No murmur heard.  No gallop.      Comments: Left carotid endarterectomy scar noted Pulmonary:     Effort: Pulmonary effort is normal.     Breath sounds: Normal breath sounds.  Abdominal:     General: Bowel sounds are normal.     Palpations: Abdomen is soft.    Laboratory examination:   Recent Labs    10/17/19 1403 07/04/20 1155 07/10/20 0700  NA 136 138 138  K 4.6 4.0 3.5  CL 100 103 105  CO2 26 25 24   GLUCOSE 104* 112* 123*  BUN 13 17 19   CREATININE 0.99 1.09* 1.07*  CALCIUM 9.5 9.6 9.6  GFRNONAA 59* 52* 53*  GFRAA 68 >60 >60   CrCl cannot be calculated (Patient's most recent lab result is older than the maximum 21 days allowed.).  CMP Latest Ref Rng & Units 07/10/2020 07/04/2020 10/17/2019  Glucose 70 - 99 mg/dL 123(H) 112(H) 104(H)  BUN 8 - 23 mg/dL 19 17 13   Creatinine 0.44 - 1.00 mg/dL 1.07(H) 1.09(H) 0.99  Sodium 135 - 145 mmol/L 138 138 136  Potassium 3.5 - 5.1 mmol/L 3.5 4.0 4.6  Chloride 98 - 111 mmol/L 105 103 100  CO2 22 - 32 mmol/L 24 25 26   Calcium 8.9 - 10.3 mg/dL 9.6 9.6 9.5  Total Protein 6.5 - 8.1 g/dL 6.6 - 6.3  Total Bilirubin 0.3 - 1.2 mg/dL 0.4 - 0.5  Alkaline Phos 38 - 126 U/L 87 - 105  AST 15 - 41 U/L 21 - 16  ALT 0 - 44 U/L 19 - 14   CBC Latest Ref Rng & Units 07/10/2020 07/04/2020 05/09/2019  WBC 4.0 - 10.5 K/uL 10.1 7.7 6.3  Hemoglobin 12.0 - 15.0 g/dL 11.8(L) 12.2 12.9  Hematocrit 36 - 46 % 37.8 38.6 39.1  Platelets 150 - 400 K/uL 245 244 216   Lipid Panel     Component Value Date/Time   CHOL 144 10/17/2019 1403   TRIG 48 10/17/2019 1403   TRIG 50 10/29/2006 1025   HDL 64 10/17/2019 1403   CHOLHDL 3 05/01/2019 1312   VLDL 14.8 05/01/2019 1312   LDLCALC 69 10/17/2019 1403   LDLDIRECT 72 10/17/2019 1403   LDLDIRECT 127.1 10/24/2010 0910   HEMOGLOBIN A1C Lab Results  Component Value Date   HGBA1C 6.4 10/16/2019   MPG 128 (H) 03/04/2011   TSH Recent Labs    10/16/19 1212  TSH 0.95   Medications and allergies    Allergies  Allergen Reactions  . Fosamax [Alendronate Sodium] Nausea And Vomiting  .  Penicillins Hives    Did it involve swelling of the face/tongue/throat, SOB, or low BP? No Did it involve sudden or severe rash/hives, skin peeling, or any reaction on the inside of your mouth or nose? Yes Did you need to seek medical attention at a hospital or doctor's office? Yes When did it last happen?Over 10 years ago If all above answers are "NO", may proceed with cephalosporin use.     Current Outpatient Medications  Medication Instructions  . amLODipine (NORVASC) 5 mg, Oral, Daily after supper  . aspirin EC 81 mg, Oral, Daily  . atorvastatin (LIPITOR) 40 MG tablet TAKE 1 TABLET BY MOUTH DAILY. ANNUAL APPT DUE IN MAY MUST SEE PROVIDER FOR FUTURE REFILLS  . benazepril-hydrochlorthiazide (LOTENSIN HCT) 10-12.5 MG tablet 1 tablet, Oral, Daily  . Calcium Carbonate-Vit D-Min (CALCIUM 1200 PO) 1,200 mg, Oral, Daily  . cholecalciferol (VITAMIN D3) 1,000 Units, Oral, Daily  . cilostazol (PLETAL) 100 mg, Oral, 2 times daily  . citalopram (CELEXA) 20 mg, Oral, Daily  . diphenhydrAMINE-APAP, sleep, (TYLENOL PM EXTRA STRENGTH PO) 2 tablets, Oral, Daily at bedtime  . ezetimibe (ZETIA) 10 mg, Oral, Daily after supper  . fluticasone furoate-vilanterol (BREO ELLIPTA) 100-25 MCG/INH AEPB 1 puff, Inhalation, Daily  . levothyroxine (SYNTHROID) 75 mcg, Oral, Daily before breakfast  . methocarbamol (ROBAXIN) 500 mg, Oral, Every 6 hours PRN  . metoprolol succinate (TOPROL-XL) 25 MG 24 hr tablet TAKE 1 TABLET BY MOUTH EVERY DAY  . montelukast (SINGULAIR) 10 MG tablet TAKE 1 TABLET BY MOUTH EVERY DAY  . Multiple Vitamin (MULTIVITAMIN WITH MINERALS) TABS tablet 1 tablet, Oral, Daily  . pantoprazole (PROTONIX) 40 mg, Oral, 2 times daily  . rOPINIRole (REQUIP) 0.5 mg, Oral, Daily at bedtime  . spironolactone (ALDACTONE) 25 MG tablet TAKE 1 TABLET BY MOUTH EVERY DAY IN THE MORNING  . zolpidem (AMBIEN) 10 MG tablet  TAKE 1 TABLET BY MOUTH AT BEDTIME AS NEEDED FOR SLEEP.   Radiology:   No results found.  Cardiac Studies:   Echocardiogram 9/62/2297:  Normal systolic function, EF 98%.  Mild LVH.  Grade 2 diastolic dysfunction.  Moderately dilated left atrium. Mild thickening of mitral valve with mild calcification.  Mild aortic valve weakening and calcification.  Event Monitor for 14 days Start date 06/02/2019:  Predominant rhythm is normal sinus rhythm.:  Symptomatic transmissions of flutter and chest pain revealed normal sinus rhythm.  Automatic detected event revealed one episode of 12 beat (8 Sec) wide complex tachycardia suggestive of NSVT at 116 bpm at 2:30 PM on 06/10/2019.  Lexiscan Myoview Stress Test 06/26/2019: Resting EKG demonstrates sinus rhythm with short PR interval, borderline criteria for LVH.   stress EKG is nondiagnostic for ischemia as his of alcoholic stress test. Symptoms during test were SOB and Dizziness.  Myocardial pefusion imaging is normal. Left ventricular ejection fraction is  67% with normal wall motion. Low risk study.  Lower Extremity Arterial Duplex 04/22/2020:  Right mid SFA has monophasic waveform suggests diffuse disease and may  indicate hemodynamically significant stenosis.  No hemodynamically significant stenosis is identified in the left lower  extremity arterial system.  This exam reveals mildly decreased perfusion of the right lower extremity,  noted at the dorsalis pedis and post tibial artery level (ABI 0.93) and  mildly decreased perfusion of the left lower extremity, noted at the  dorsalis pedis and post tibial artery level (ABI 0.93).   Carotid artery duplex 04/22/2020:  Stenosis in the right internal carotid artery (16-49%).  Minimal stenosis  in the left internal carotid artery (minimal). Patent  left endarterectomy site.  Antegrade right vertebral artery flow. Antegrade left vertebral artery flow.  Follow up in one year is appropriate if clinically  indicated. No  significant change from 07/07/2019   EKG  02/05/20: Sinus Rhythm. Nonspecific T-abnormality. Abnormal.  05/31/2019: Sinus rhythm with short PR interval, PR interval 116 ms.  Possible left atrial abnormality.  No evidence of ischemia, normal QT interval.  Assessment     ICD-10-CM   1. Essential hypertension  I10 CBC    Basic metabolic panel    CANCELED: EKG 12-Lead  2. Claudication in peripheral vascular disease (HCC)  I73.9 cilostazol (PLETAL) 100 MG tablet    aspirin EC 81 MG tablet  3. Asymptomatic bilateral carotid artery stenosis  I65.23   4. Hypercholesteremia  E78.00 Lipid Panel With LDL/HDL Ratio    Meds ordered this encounter  Medications  . cilostazol (PLETAL) 100 MG tablet    Sig: Take 1 tablet (100 mg total) by mouth 2 (two) times daily.    Dispense:  60 tablet    Refill:  3    Discontinue Plavix  . aspirin EC 81 MG tablet    Sig: Take 1 tablet (81 mg total) by mouth daily.    Dispense:  90 tablet    Refill:  3    Medications Discontinued During This Encounter  Medication Reason  . HYDROcodone-acetaminophen (NORCO/VICODIN) 5-325 MG tablet Side effect (s)  . oxyCODONE-acetaminophen (PERCOCET/ROXICET) 5-325 MG tablet Patient Preference  . gabapentin (NEURONTIN) 400 MG capsule No longer needed (for PRN medications)    Recommendations:   [name] [name]  is a  69 y.o. AAFpatient  with history of left carotid endarterectomy due to symptomatic carotid stenosis in 2012 with TIA, asymptomatic right carotid stenosis, bronchial asthma, hypertension, hyperlipidemia, hyperglycemia presents for follow-up of claudication. Underwent back surgery in July 2021 with resolution of back pain.   Her main complaint continues to be claudication right leg worse than left. I reviewed her duplex, she does have probably significant stenosis in the right SFA  Otherwise her risk factors are well controlled including hypertension hyperlipidemia.  Will schedule for peripheral  arteriogram and possible angioplasty given symptoms. Patient understands the risks, benefits, alternatives including medical therapy, CT angiography. Patient understands <1-2% risk of death, embolic complications, bleeding, infection, renal failure, urgent surgical revascularization, but not limited to these and wants to proceed.   Check labs and lipids.   Adrian Prows, MD, [facility] 08/05/2020, [place] AM Office: [phone]

## 2020-08-06 ENCOUNTER — Ambulatory Visit (INDEPENDENT_AMBULATORY_CARE_PROVIDER_SITE_OTHER): Payer: Medicare PPO | Admitting: Psychology

## 2020-08-06 DIAGNOSIS — F330 Major depressive disorder, recurrent, mild: Secondary | ICD-10-CM

## 2020-08-09 LAB — BASIC METABOLIC PANEL
BUN/Creatinine Ratio: 14 (ref 12–28)
BUN: 12 mg/dL (ref 8–27)
CO2: 25 mmol/L (ref 20–29)
Calcium: 9.9 mg/dL (ref 8.7–10.3)
Chloride: 101 mmol/L (ref 96–106)
Creatinine, Ser: 0.86 mg/dL (ref 0.57–1.00)
GFR calc Af Amer: 80 mL/min/{1.73_m2} (ref >59)
GFR calc non Af Amer: 70 mL/min/{1.73_m2} (ref >59)
Glucose: 104 mg/dL — ABNORMAL HIGH (ref 65–99)
Potassium: 4.1 mmol/L (ref 3.5–5.2)
Sodium: 139 mmol/L (ref 134–144)

## 2020-08-09 LAB — CBC
Hematocrit: 32.4 % — ABNORMAL LOW (ref 34.0–46.6)
Hemoglobin: 11.1 g/dL (ref 11.1–15.9)
MCH: 31.3 pg (ref 26.6–33.0)
MCHC: 34.3 g/dL (ref 31.5–35.7)
MCV: 91 fL (ref 79–97)
Platelets: 328 10*3/uL (ref 150–450)
RBC: 3.55 x10E6/uL — ABNORMAL LOW (ref 3.77–5.28)
RDW: 12.3 % (ref 11.7–15.4)
WBC: 7.6 10*3/uL (ref 3.4–10.8)

## 2020-08-09 LAB — LIPID PANEL WITH LDL/HDL RATIO
Cholesterol, Total: 172 mg/dL (ref 100–199)
HDL: 80 mg/dL (ref >39)
LDL Chol Calc (NIH): 80 mg/dL (ref 0–99)
LDL/HDL Ratio: 1 ratio (ref 0.0–3.2)
Triglycerides: 60 mg/dL (ref 0–149)
VLDL Cholesterol Cal: 12 mg/dL (ref 5–40)

## 2020-08-13 ENCOUNTER — Ambulatory Visit (INDEPENDENT_AMBULATORY_CARE_PROVIDER_SITE_OTHER): Payer: Medicare PPO | Admitting: Psychology

## 2020-08-13 DIAGNOSIS — F330 Major depressive disorder, recurrent, mild: Secondary | ICD-10-CM | POA: Diagnosis not present

## 2020-08-20 ENCOUNTER — Ambulatory Visit (INDEPENDENT_AMBULATORY_CARE_PROVIDER_SITE_OTHER): Payer: Medicare PPO | Admitting: Psychology

## 2020-08-20 DIAGNOSIS — F330 Major depressive disorder, recurrent, mild: Secondary | ICD-10-CM

## 2020-08-24 ENCOUNTER — Other Ambulatory Visit (HOSPITAL_COMMUNITY)
Admission: RE | Admit: 2020-08-24 | Discharge: 2020-08-24 | Disposition: A | Discharge status: Home / Self Care | Payer: Medicare PPO | Source: Ambulatory Visit | Attending: Cardiology | Admitting: Cardiology

## 2020-08-24 DIAGNOSIS — Z01812 Encounter for preprocedural laboratory examination: Secondary | ICD-10-CM | POA: Diagnosis present

## 2020-08-24 DIAGNOSIS — Z20822 Contact with and (suspected) exposure to covid-19: Secondary | ICD-10-CM | POA: Diagnosis not present

## 2020-08-24 LAB — SARS CORONAVIRUS 2 (TAT 6-24 HRS): SARS Coronavirus 2: NEGATIVE

## 2020-08-26 DIAGNOSIS — I739 Peripheral vascular disease, unspecified: Secondary | ICD-10-CM | POA: Diagnosis present

## 2020-08-27 ENCOUNTER — Encounter (HOSPITAL_COMMUNITY): Payer: Self-pay | Admitting: Cardiology

## 2020-08-27 ENCOUNTER — Telehealth: Payer: Self-pay

## 2020-08-27 ENCOUNTER — Ambulatory Visit: Payer: Medicare PPO | Admitting: Psychology

## 2020-08-27 ENCOUNTER — Telehealth: Payer: Self-pay | Admitting: Cardiology

## 2020-08-27 ENCOUNTER — Encounter (HOSPITAL_COMMUNITY)
Admission: RE | Disposition: A | Discharge status: Home / Self Care | Payer: Self-pay | Source: Home / Self Care | Attending: Cardiology

## 2020-08-27 ENCOUNTER — Ambulatory Visit (HOSPITAL_COMMUNITY)
Admission: RE | Admit: 2020-08-27 | Discharge: 2020-08-27 | Disposition: A | Discharge status: Home / Self Care | Payer: Medicare PPO | Attending: Cardiology | Admitting: Cardiology

## 2020-08-27 DIAGNOSIS — Z87891 Personal history of nicotine dependence: Secondary | ICD-10-CM | POA: Insufficient documentation

## 2020-08-27 DIAGNOSIS — Z79890 Hormone replacement therapy (postmenopausal): Secondary | ICD-10-CM | POA: Insufficient documentation

## 2020-08-27 DIAGNOSIS — Z79899 Other long term (current) drug therapy: Secondary | ICD-10-CM | POA: Insufficient documentation

## 2020-08-27 DIAGNOSIS — G2581 Restless legs syndrome: Secondary | ICD-10-CM | POA: Insufficient documentation

## 2020-08-27 DIAGNOSIS — E7800 Pure hypercholesterolemia, unspecified: Secondary | ICD-10-CM | POA: Diagnosis not present

## 2020-08-27 DIAGNOSIS — Z7982 Long term (current) use of aspirin: Secondary | ICD-10-CM | POA: Insufficient documentation

## 2020-08-27 DIAGNOSIS — I739 Peripheral vascular disease, unspecified: Secondary | ICD-10-CM | POA: Diagnosis present

## 2020-08-27 DIAGNOSIS — K219 Gastro-esophageal reflux disease without esophagitis: Secondary | ICD-10-CM | POA: Insufficient documentation

## 2020-08-27 DIAGNOSIS — Z8673 Personal history of transient ischemic attack (TIA), and cerebral infarction without residual deficits: Secondary | ICD-10-CM | POA: Insufficient documentation

## 2020-08-27 DIAGNOSIS — G894 Chronic pain syndrome: Secondary | ICD-10-CM | POA: Diagnosis not present

## 2020-08-27 DIAGNOSIS — I10 Essential (primary) hypertension: Secondary | ICD-10-CM | POA: Diagnosis not present

## 2020-08-27 DIAGNOSIS — Z880 Allergy status to penicillin: Secondary | ICD-10-CM | POA: Diagnosis not present

## 2020-08-27 DIAGNOSIS — F419 Anxiety disorder, unspecified: Secondary | ICD-10-CM | POA: Diagnosis not present

## 2020-08-27 DIAGNOSIS — E039 Hypothyroidism, unspecified: Secondary | ICD-10-CM | POA: Insufficient documentation

## 2020-08-27 DIAGNOSIS — J45909 Unspecified asthma, uncomplicated: Secondary | ICD-10-CM | POA: Insufficient documentation

## 2020-08-27 DIAGNOSIS — E785 Hyperlipidemia, unspecified: Secondary | ICD-10-CM | POA: Insufficient documentation

## 2020-08-27 DIAGNOSIS — I70213 Atherosclerosis of native arteries of extremities with intermittent claudication, bilateral legs: Secondary | ICD-10-CM | POA: Insufficient documentation

## 2020-08-27 DIAGNOSIS — I6523 Occlusion and stenosis of bilateral carotid arteries: Secondary | ICD-10-CM | POA: Diagnosis not present

## 2020-08-27 HISTORY — PX: PERIPHERAL VASCULAR BALLOON ANGIOPLASTY: CATH118281

## 2020-08-27 HISTORY — PX: ABDOMINAL AORTOGRAM W/LOWER EXTREMITY: CATH118223

## 2020-08-27 LAB — POCT ACTIVATED CLOTTING TIME: Activated Clotting Time: 208 seconds

## 2020-08-27 SURGERY — ABDOMINAL AORTOGRAM W/LOWER EXTREMITY
Anesthesia: LOCAL

## 2020-08-27 MED ORDER — CLOPIDOGREL BISULFATE 300 MG PO TABS
ORAL_TABLET | ORAL | Status: AC
  Filled 2020-08-27: qty 1

## 2020-08-27 MED ORDER — HEPARIN (PORCINE) IN NACL 1000-0.9 UT/500ML-% IV SOLN
INTRAVENOUS | Status: DC | PRN
  Administered 2020-08-27 (×2): 500 mL

## 2020-08-27 MED ORDER — CLOPIDOGREL BISULFATE 300 MG PO TABS
ORAL_TABLET | ORAL | Status: DC | PRN
  Administered 2020-08-27: 300 mg via ORAL

## 2020-08-27 MED ORDER — SODIUM CHLORIDE 0.9 % IV SOLN: 250.0000 mL | INTRAVENOUS | Status: DC | PRN

## 2020-08-27 MED ORDER — FENTANYL CITRATE (PF) 100 MCG/2ML IJ SOLN
INTRAMUSCULAR | Status: AC
  Filled 2020-08-27: qty 2

## 2020-08-27 MED ORDER — SODIUM CHLORIDE 0.9 % IV SOLN: INTRAVENOUS | Status: DC

## 2020-08-27 MED ORDER — LIDOCAINE HCL (PF) 1 % IJ SOLN
INTRAMUSCULAR | Status: DC | PRN
  Administered 2020-08-27: 20 mL

## 2020-08-27 MED ORDER — SODIUM CHLORIDE 0.9% FLUSH: 3.0000 mL | Freq: Two times a day (BID) | INTRAVENOUS | Status: DC

## 2020-08-27 MED ORDER — HEPARIN SODIUM (PORCINE) 1000 UNIT/ML IJ SOLN
INTRAMUSCULAR | Status: DC | PRN
  Administered 2020-08-27: 4000 [IU] via INTRAVENOUS
  Administered 2020-08-27: 6000 [IU] via INTRAVENOUS

## 2020-08-27 MED ORDER — LIDOCAINE HCL (PF) 1 % IJ SOLN
INTRAMUSCULAR | Status: AC
  Filled 2020-08-27: qty 30

## 2020-08-27 MED ORDER — NITROGLYCERIN 1 MG/10 ML FOR IR/CATH LAB
INTRA_ARTERIAL | Status: DC | PRN
  Administered 2020-08-27: 200 ug via INTRA_ARTERIAL

## 2020-08-27 MED ORDER — MIDAZOLAM HCL 2 MG/2ML IJ SOLN
INTRAMUSCULAR | Status: DC | PRN
  Administered 2020-08-27: 1 mg via INTRAVENOUS
  Administered 2020-08-27: 2 mg via INTRAVENOUS

## 2020-08-27 MED ORDER — SODIUM CHLORIDE 0.9% FLUSH: 3.0000 mL | INTRAVENOUS | Status: DC | PRN

## 2020-08-27 MED ORDER — IODIXANOL 320 MG/ML IV SOLN
INTRAVENOUS | Status: DC | PRN
  Administered 2020-08-27: 130 mL via INTRA_ARTERIAL

## 2020-08-27 MED ORDER — HEPARIN (PORCINE) IN NACL 1000-0.9 UT/500ML-% IV SOLN
INTRAVENOUS | Status: AC
  Filled 2020-08-27: qty 500

## 2020-08-27 MED ORDER — FENTANYL CITRATE (PF) 100 MCG/2ML IJ SOLN
INTRAMUSCULAR | Status: DC | PRN
Start: 2020-08-27 — End: 2020-08-27
  Administered 2020-08-27 (×3): 50 ug via INTRAVENOUS

## 2020-08-27 MED ORDER — CLOPIDOGREL BISULFATE 75 MG PO TABS: 75.0000 mg | ORAL_TABLET | Freq: Every day | ORAL | 2 refills | Status: DC

## 2020-08-27 MED ORDER — MIDAZOLAM HCL 2 MG/2ML IJ SOLN
INTRAMUSCULAR | Status: AC
  Filled 2020-08-27: qty 2

## 2020-08-27 MED ORDER — HEPARIN SODIUM (PORCINE) 1000 UNIT/ML IJ SOLN
INTRAMUSCULAR | Status: AC
  Filled 2020-08-27: qty 1

## 2020-08-27 MED ORDER — NITROGLYCERIN 1 MG/10 ML FOR IR/CATH LAB
INTRA_ARTERIAL | Status: AC
  Filled 2020-08-27: qty 10

## 2020-08-27 MED FILL — CLOPIDOGREL 75 MG TABLET: 75 | 30 days supply | Qty: 30 | Fill #0

## 2020-08-27 SURGICAL SUPPLY — 24 items
BALLN CHOCOLATE 5.0X120X120 (BALLOONS) ×3
BALLOON CHOCOLATE 5.0X120X120 (BALLOONS) IMPLANT
CATH ANGIO 5F PIGTAIL 65CM (CATHETERS) ×1 IMPLANT
CATH CROSS OVER TEMPO 5F (CATHETERS) ×1 IMPLANT
CATH STRAIGHT 5FR 65CM (CATHETERS) ×1 IMPLANT
DEVICE CLOSURE PERCLS PRGLD 6F (VASCULAR PRODUCTS) IMPLANT
GUIDEWIRE ANGLED .035X150CM (WIRE) ×1 IMPLANT
KIT MICROPUNCTURE NIT STIFF (SHEATH) ×1 IMPLANT
KIT PV (KITS) ×3 IMPLANT
PERCLOSE PROGLIDE 6F (VASCULAR PRODUCTS) ×3
SHEATH FLEX ANSEL ANG 6F 45CM (SHEATH) ×1 IMPLANT
SHEATH PINNACLE 5F 10CM (SHEATH) ×1 IMPLANT
SHEATH PINNACLE 6F 10CM (SHEATH) ×1 IMPLANT
SHEATH PROBE COVER 6X72 (BAG) ×1 IMPLANT
STOPCOCK MORSE 400PSI 3WAY (MISCELLANEOUS) ×1 IMPLANT
SYR MEDRAD MARK 7 150ML (SYRINGE) ×3 IMPLANT
TAPE VIPERTRACK RADIOPAQ (MISCELLANEOUS) IMPLANT
TAPE VIPERTRACK RADIOPAQUE (MISCELLANEOUS) ×3
TRANSDUCER W/STOPCOCK (MISCELLANEOUS) ×3 IMPLANT
TRAY PV CATH (CUSTOM PROCEDURE TRAY) ×3 IMPLANT
TUBING CIL FLEX 10 FLL-RA (TUBING) ×1 IMPLANT
WIRE HITORQ VERSACORE ST 145CM (WIRE) ×1 IMPLANT
WIRE SPARTACORE .014X300CM (WIRE) ×1 IMPLANT
WIRE VERSACORE LOC 115CM (WIRE) ×1 IMPLANT

## 2020-08-27 NOTE — Interval H&P Note (Signed)
History and Physical Interval Note:  08/27/2020 7:34 AM  [name] [name]  has presented today for surgery, with the diagnosis of claudication - hp.  The various methods of treatment have been discussed with the patient and family. After consideration of risks, benefits and other options for treatment, the patient has consented to  Procedure(s): ABDOMINAL AORTOGRAM W/LOWER EXTREMITY (N/A) as a surgical intervention.  The patient's history has been reviewed, patient examined, no change in status, stable for surgery.  I have reviewed the patient's chart and labs.  Questions were answered to the patient's satisfaction.     [place]

## 2020-08-27 NOTE — Telephone Encounter (Signed)
ON-CALL CARDIOLOGY 08/27/20  Patient's name: [name] [name].   MRN: [id number].    DOB: 1951-12-09 Primary care provider: Marrian Salvage, [place]. Primary cardiologist: Dr. Adrian Prows On-call cardiologist: Rex Kras, DO  Interaction regarding this patient's care today: Patient's son Vilinda Boehringer called the on-call service as the patient had peripheral angiography and noted bleeding around the area where the gauze was taped but no active bleeding.  By  the time I called back I spoke to the patient's daughter-in-law and paramedics were at site.  Patient's left femoral site from where the peripheral angiogram was performed had a gauze and bandages placed over it after successful hemostasis was achieved.  Patient was later discharged home.   Family noted that the bandages started to come off and when they peel them off and the gauze underneath had fresh blood on it (not gushing per patient).  They were concerned so they called EMS for further direction.   I spoke to the EMS from [facility] over the phone.  I had them checked of pulses distally which were intact, lower extremities were warm to touch, no hematoma or ecchymosis noted at the arteriotomy site according to them. However, the site was tender to touch.   Blood pressure per EMS 150/90, heart rate of 88 bpm.  It appears that the patient had gone to the bathroom twice to urinate but did not bear down for any excessive period of time.  Recommendations: Patient is hemodynamically stable, peripheral pulses are intact and legs are warm to touch, and no active bleeding per EMS. Patient is asked to rest, avoid excessive moving of the lower extremities, and not to to bear down. She is asked to seek medical attention sooner if she becomes hypotensive (they do have a blood pressure cuff to monitor this), lower extremities feel cold, decreased pulses, and/or noticing bleeding at the site of the procedure.  I will have the office reach out to  her in the morning for her to be seen tomorrow in the clinic.    They are more than welcome to call me back if any questions or concerns arise.  The plan of care was discussed with both EMS and the patient's daughter in law [place].  Total telephone encounter time: 19 minutes.  Rex Kras, DO, [facility] [place] Office: [phone]

## 2020-08-27 NOTE — Discharge Instructions (Signed)
Femoral Site Care This sheet gives you information about how to care for yourself after your procedure. Your health care provider may also give you more specific instructions. If you have problems or questions, contact your health care provider. What can I expect after the procedure?  After the procedure, it is common to have:  Bruising that usually fades within 1-2 weeks.  Tenderness at the site. Follow these instructions at home: Wound care 1. Follow instructions from your health care provider about how to take care of your insertion site. Make sure you: ? Wash your hands with soap and water before you change your bandage (dressing). If soap and water are not available, use hand sanitizer. ? Remove your dressing as told by your health care provider. In 24 hours 2. Do not take baths, swim, or use a hot tub until your health care provider approves. 3. You may shower 24-48 hours after the procedure or as told by your health care provider. ? Gently wash the site with plain soap and water. ? Pat the area dry with a clean towel. ? Do not rub the site. This may cause bleeding. 4. Do not apply powder or lotion to the site. Keep the site clean and dry. 5. Check your femoral site every day for signs of infection. Check for: ? Redness, swelling, or pain. ? Fluid or blood. ? Warmth. ? Pus or a bad smell. Activity 1. For the first 2-3 days after your procedure, or as long as directed: ? Avoid climbing stairs as much as possible. ? Do not squat. 2. Do not lift anything that is heavier than 10 lb (4.5 kg), or the limit that you are told, until your health care provider says that it is safe. For 5 days 3. Rest as directed. ? Avoid sitting for a long time without moving. Get up to take short walks every 1-2 hours. 4. Do not drive for 24 hours if you were given a medicine to help you relax (sedative). General instructions  Take over-the-counter and prescription medicines only as told by your  health care provider.  Keep all follow-up visits as told by your health care provider. This is important. Contact a health care provider if you have:  A fever or chills.  You have redness, swelling, or pain around your insertion site. Get help right away if:  The catheter insertion area swells very fast.  You pass out.  You suddenly start to sweat or your skin gets clammy.  The catheter insertion area is bleeding, and the bleeding does not stop when you hold steady pressure on the area.  The area near or just beyond the catheter insertion site becomes pale, cool, tingly, or numb. These symptoms may represent a serious problem that is an emergency. Do not wait to see if the symptoms will go away. Get medical help right away. Call your local emergency services (911 in the [place] Do not drive yourself to the hospital. Summary  After the procedure, it is common to have bruising that usually fades within 1-2 weeks.  Check your femoral site every day for signs of infection.  Do not lift anything that is heavier than 10 lb (4.5 kg), or the limit that you are told, until your health care provider says that it is safe. This information is not intended to replace advice given to you by your health care provider. Make sure you discuss any questions you have with your health care provider. Document Revised: 12/13/2017 Document Reviewed: 12/13/2017   Elsevier Patient Education  [employer].    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  Injury or illness was: ___Work-related. ___Not work-related. ___Undetermined if work-related. Return to work  Employee may return to work on ______________________.

## 2020-08-27 NOTE — Telephone Encounter (Signed)
Pt called to inform us that she just had a scan done this morning by Dr. Einar Gip. Was told not to take Robaxin 500mg . Pt accidently took one pill. Pre Celeste pt should drink a lot of water. Pt understood.

## 2020-08-29 ENCOUNTER — Other Ambulatory Visit: Payer: Self-pay | Admitting: Family

## 2020-08-29 DIAGNOSIS — Z1231 Encounter for screening mammogram for malignant neoplasm of breast: Secondary | ICD-10-CM

## 2020-09-03 ENCOUNTER — Ambulatory Visit (INDEPENDENT_AMBULATORY_CARE_PROVIDER_SITE_OTHER): Payer: Medicare PPO | Admitting: Psychology

## 2020-09-03 DIAGNOSIS — F330 Major depressive disorder, recurrent, mild: Secondary | ICD-10-CM

## 2020-09-09 ENCOUNTER — Encounter: Payer: Medicare PPO | Admitting: Family

## 2020-09-10 ENCOUNTER — Ambulatory Visit (INDEPENDENT_AMBULATORY_CARE_PROVIDER_SITE_OTHER): Payer: Medicare PPO | Admitting: Psychology

## 2020-09-10 DIAGNOSIS — F330 Major depressive disorder, recurrent, mild: Secondary | ICD-10-CM | POA: Diagnosis not present

## 2020-09-11 ENCOUNTER — Other Ambulatory Visit: Payer: Self-pay

## 2020-09-11 ENCOUNTER — Ambulatory Visit
Admission: RE | Admit: 2020-09-11 | Discharge: 2020-09-11 | Disposition: A | Discharge status: Home / Self Care | Payer: Medicare PPO | Source: Ambulatory Visit | Attending: Family | Admitting: Family

## 2020-09-11 DIAGNOSIS — Z1231 Encounter for screening mammogram for malignant neoplasm of breast: Secondary | ICD-10-CM

## 2020-09-12 ENCOUNTER — Encounter: Payer: Self-pay | Admitting: Cardiology

## 2020-09-12 ENCOUNTER — Ambulatory Visit: Payer: Medicare PPO | Admitting: Cardiology

## 2020-09-12 ENCOUNTER — Other Ambulatory Visit: Payer: Self-pay

## 2020-09-12 VITALS — BP 108/72 | HR 72 | Resp 16 | Ht 59.0 in | Wt 150.0 lb

## 2020-09-12 DIAGNOSIS — I10 Essential (primary) hypertension: Secondary | ICD-10-CM

## 2020-09-12 DIAGNOSIS — I739 Peripheral vascular disease, unspecified: Secondary | ICD-10-CM

## 2020-09-12 MED ORDER — CLOPIDOGREL BISULFATE 75 MG PO TABS: 75.0000 mg | ORAL_TABLET | Freq: Every day | ORAL | 3 refills | Status: DC

## 2020-09-12 NOTE — Progress Notes (Signed)
Primary Physician/Referring:  Marrian Salvage, FNP  Patient ID: [name] [name], female    DOB: 03/29/1951, 69 y.o.   MRN: [id number]  Chief Complaint  Patient presents with  . Hypertension  . PAD  . Follow-up   HPI:    [name] [name]  is a 69 y.o. female  with history of left carotid endarterectomy due to symptomatic carotid stenosis in 2012 with TIA, asymptomatic right carotid stenosis, bronchial asthma, hypertension, hyperlipidemia, hyperglycemia presents for 1 month OV.  She had difficult to control hypertension which is now well controlled on spironolactone, hypokalemia is resolved.    Underwent back surgery in July 2021 with resolution of back pain.  Due to severe symptoms of claudication involving the right leg, underwent peripheral arteriogram and angioplasty to right SFA on 08/27/2020, has noticed significant improvement in her walking distance and symptoms of claudication.  No other specific complaints today.  Past Medical History:  Diagnosis Date  . Acid reflux   . ALCOHOL ABUSE, HX OF 11/06/2007  . ALLERGIC RHINITIS 11/06/2007  . Anxiety 04/03/2011  . ASTHMA 09/13/2007  . Asthma   . Chronic pain syndrome 12/15/2016  . COLONIC POLYPS, HX OF 02/09/2008    ADENOMATOUS POLYP  . Encounter for well adult exam without abnormal findings 04/03/2011  . HYPERLIPIDEMIA 11/03/2010  . HYPERTENSION 09/13/2007  . HYPOTHYROIDISM 11/06/2007  . Impaired glucose tolerance 07/30/2013  . INSOMNIA, HX OF 09/13/2007  . Lumbar degenerative disc disease 12/15/2016  . OSTEOPOROSIS 11/06/2007  . PERIMENOPAUSAL STATUS 09/13/2007  . RLS (restless legs syndrome)   . Stroke ([place])   . TIA (transient ischemic attack) 11/04/2011   Past Surgical History:  Procedure Laterality Date  . ABDOMINAL AORTOGRAM W/LOWER EXTREMITY N/A 08/27/2020   Procedure: ABDOMINAL AORTOGRAM W/LOWER EXTREMITY;  Surgeon: Nigel Mormon, MD;  Location: [facility];  Service: Cardiovascular;  Laterality: N/A;  .  BREAST EXCISIONAL BIOPSY Left   . BREAST SURGERY  2008 and 2012   x 2 - benign, left side  . carotid artery surgery Left   . CESAREAN SECTION     x 3  . COLONOSCOPY  multiple   2010  . PERIPHERAL VASCULAR BALLOON ANGIOPLASTY  08/27/2020   Procedure: PERIPHERAL VASCULAR BALLOON ANGIOPLASTY;  Surgeon: Nigel Mormon, MD;  Location: [facility];  Service: Cardiovascular;;  Right SFA scoring balloon  . TRANSFORAMINAL LUMBAR INTERBODY FUSION (TLIF) WITH PEDICLE SCREW FIXATION 1 LEVEL Left 07/10/2020   Procedure: LEFT-SIDED LUMBAR FOUR-FIVE TRANSFORAMINAL LUMBAR INTERBODY FUSION WITH INSTRUMENTATION AND ALLOGRAFT;  Surgeon: Phylliss Bob, MD;  Location: [facility];  Service: Orthopedics;  Laterality: Left;   Family History  Problem Relation Age of Onset  . Hyperlipidemia Other   . Hypertension Other   . Coronary artery disease Other   . Breast cancer Cousin   . Hypertension Mother   . Heart disease Mother   . Heart failure Mother   . Dementia Maternal Grandmother   . Colon cancer Neg Hx     Social History   Tobacco Use  . Smoking status: Former Smoker    Packs/day: 0.25    Types: Cigarettes    Quit date: 05/31/1979    Years since quitting: 41.3  . Smokeless tobacco: Current User    Types: Snuff  . Tobacco comment: social smoker  Substance Use Topics  . Alcohol use: No    Comment: former alcholic   Marital Status: Divorced  ROS  Review of Systems  Cardiovascular: Positive for claudication.  Negative for dyspnea on exertion and leg swelling.  Respiratory: Positive for wheezing (occasional).   Musculoskeletal: Positive for arthritis and back pain (immproved since surgery).  Gastrointestinal: Negative for melena.  All other systems reviewed and are negative.  Objective  Blood pressure 108/72, pulse 72, resp. rate 16, height 4\' 11"  (1.499 m), weight 150 lb (68 kg), SpO2 98 %.  Vitals with BMI 09/12/2020 08/27/2020 08/27/2020  Height 4\' 11"  - -  Weight 150 lbs - -  BMI  55.73 - -  Systolic 220 97 254  Diastolic 72 60 71  Pulse 72 69 71     Physical Exam Constitutional:      General: She is not in acute distress.    Comments: Short stature  HENT:     Head: Atraumatic.  Cardiovascular:     Rate and Rhythm: Normal rate and regular rhythm.     Pulses: Intact distal pulses.          Carotid pulses are on the right side with bruit.      Popliteal pulses are 0 on the right side and 0 on the left side.       Dorsalis pedis pulses are 1+ on the right side and 1+ on the left side.       Posterior tibial pulses are 0 on the right side and 0 on the left side.     Heart sounds: S1 normal and S2 normal. No murmur heard.  No gallop.      Comments: Left carotid endarterectomy scar noted Pulmonary:     Effort: Pulmonary effort is normal.     Breath sounds: Normal breath sounds.  Abdominal:     General: Bowel sounds are normal.     Palpations: Abdomen is soft.    Laboratory examination:   Recent Labs    07/04/20 1155 07/10/20 0700 08/08/20 1408  NA 138 138 139  K 4.0 3.5 4.1  CL 103 105 101  CO2 25 24 25   GLUCOSE 112* 123* 104*  BUN 17 19 12   CREATININE 1.09* 1.07* 0.86  CALCIUM 9.6 9.6 9.9  GFRNONAA 52* 53* 70  GFRAA >60 >60 80   CrCl cannot be calculated (Patient's most recent lab result is older than the maximum 21 days allowed.).  CMP Latest Ref Rng & Units 08/08/2020 07/10/2020 07/04/2020  Glucose 65 - 99 mg/dL 104(H) 123(H) 112(H)  BUN 8 - 27 mg/dL 12 19 17   Creatinine 0.57 - 1.00 mg/dL 0.86 1.07(H) 1.09(H)  Sodium 134 - 144 mmol/L 139 138 138  Potassium 3.5 - 5.2 mmol/L 4.1 3.5 4.0  Chloride 96 - 106 mmol/L 101 105 103  CO2 20 - 29 mmol/L 25 24 25   Calcium 8.7 - 10.3 mg/dL 9.9 9.6 9.6  Total Protein 6.5 - 8.1 g/dL - 6.6 -  Total Bilirubin 0.3 - 1.2 mg/dL - 0.4 -  Alkaline Phos 38 - 126 U/L - 87 -  AST 15 - 41 U/L - 21 -  ALT 0 - 44 U/L - 19 -   CBC Latest Ref Rng & Units 08/08/2020 07/10/2020 07/04/2020  WBC 3.4 - 10.8 x10E3/uL 7.6  10.1 7.7  Hemoglobin 11.1 - 15.9 g/dL 11.1 11.8(L) 12.2  Hematocrit 34.0 - 46.6 % 32.4(L) 37.8 38.6  Platelets 150 - 450 x10E3/uL 328 245 244    Lipid Panel Recent Labs    10/17/19 1403 08/08/20 1408  CHOL 144 172  TRIG 48 60  LDLCALC 69 80  HDL 64 80  LDLDIRECT  72  --     HEMOGLOBIN A1C Lab Results  Component Value Date   HGBA1C 6.4 10/16/2019   MPG 128 (H) 03/04/2011   TSH Recent Labs    10/16/19 1212  TSH 0.95   Medications and allergies   Allergies  Allergen Reactions  . Fosamax [Alendronate Sodium] Nausea And Vomiting  . Penicillins Hives    Did it involve swelling of the face/tongue/throat, SOB, or low BP? No Did it involve sudden or severe rash/hives, skin peeling, or any reaction on the inside of your mouth or nose? Yes Did you need to seek medical attention at a hospital or doctor's office? Yes When did it last happen?Over 10 years ago If all above answers are "NO", may proceed with cephalosporin use.     Current Outpatient Medications  Medication Instructions  . acetaminophen (TYLENOL) 1,300 mg, Oral, Every 8 hours PRN  . amLODipine (NORVASC) 5 mg, Oral, Daily after supper  . aspirin EC 81 mg, Oral, Daily  . atorvastatin (LIPITOR) 40 MG tablet TAKE 1 TABLET BY MOUTH DAILY. ANNUAL APPT DUE IN MAY MUST SEE PROVIDER FOR FUTURE REFILLS  . benazepril-hydrochlorthiazide (LOTENSIN HCT) 10-12.5 MG tablet 1 tablet, Oral, Daily  . Calcium Carbonate-Vit D-Min (CALCIUM 1200 PO) 1,200 mg, Oral, Daily  . citalopram (CELEXA) 20 mg, Oral, Daily  . clopidogrel (PLAVIX) 75 mg, Oral, Daily  . diphenhydrAMINE-APAP, sleep, (TYLENOL PM EXTRA STRENGTH PO) 2 tablets, Oral, Daily at bedtime  . ezetimibe (ZETIA) 10 mg, Oral, Daily after supper  . fluticasone furoate-vilanterol (BREO ELLIPTA) 100-25 MCG/INH AEPB 1 puff, Inhalation, Daily  . levothyroxine (SYNTHROID) 75 mcg, Oral, Daily before breakfast  . metoprolol succinate (TOPROL-XL) 25 MG 24 hr tablet TAKE 1 TABLET BY  MOUTH EVERY DAY  . montelukast (SINGULAIR) 10 MG tablet TAKE 1 TABLET BY MOUTH EVERY DAY  . Multiple Vitamin (MULTIVITAMIN WITH MINERALS) TABS tablet 1 tablet, Oral, Daily  . pantoprazole (PROTONIX) 40 mg, Oral, 2 times daily  . rOPINIRole (REQUIP) 0.5 mg, Oral, Daily at bedtime  . spironolactone (ALDACTONE) 25 MG tablet TAKE 1 TABLET BY MOUTH EVERY DAY IN THE MORNING  . Vitamin D 2,000 Units, Oral, Daily  . zolpidem (AMBIEN) 10 MG tablet TAKE 1 TABLET BY MOUTH AT BEDTIME AS NEEDED FOR SLEEP.   Radiology:   No results found.  Cardiac Studies:   Echocardiogram 2/95/6213:  Normal systolic function, EF 08%.  Mild LVH.  Grade 2 diastolic dysfunction.  Moderately dilated left atrium. Mild thickening of mitral valve with mild calcification.  Mild aortic valve weakening and calcification.  Event Monitor for 14 days Start date 06/02/2019:  Predominant rhythm is normal sinus rhythm.:  Symptomatic transmissions of flutter and chest pain revealed normal sinus rhythm.  Automatic detected event revealed one episode of 12 beat (8 Sec) wide complex tachycardia suggestive of NSVT at 116 bpm at 2:30 PM on 06/10/2019.  Lexiscan Myoview Stress Test 06/26/2019: Resting EKG demonstrates sinus rhythm with short PR interval, borderline criteria for LVH.   stress EKG is nondiagnostic for ischemia as his of alcoholic stress test. Symptoms during test were SOB and Dizziness.  Myocardial pefusion imaging is normal. Left ventricular ejection fraction is  67% with normal wall motion. Low risk study.  Lower Extremity Arterial Duplex 04/22/2020:  Right mid SFA has monophasic waveform suggests diffuse disease and may  indicate hemodynamically significant stenosis.  No hemodynamically significant stenosis is identified in the left lower  extremity arterial system.  This exam reveals mildly decreased perfusion of the  right lower extremity,  noted at the dorsalis pedis and post tibial artery level (ABI 0.93) and  mildly  decreased perfusion of the left lower extremity, noted at the  dorsalis pedis and post tibial artery level (ABI 0.93).   Carotid artery duplex 04/22/2020:  Stenosis in the right internal carotid artery (16-49%).  Minimal stenosis in the left internal carotid artery (minimal). Patent  left endarterectomy site.  Antegrade right vertebral artery flow. Antegrade left vertebral artery flow.  Follow up in one year is appropriate if clinically indicated. No  significant change from 07/07/2019   PERIPHERAL VASC BALLOON ANGIOPLASTY/ABD AORTOGRAM W/LOWER EXTREMITY 08/27/2020:  Rt SFA prox 70% diffuse, mid 95% focal stenosis Left SFA mid 20% stenosis Bilateral internal iliac ostial 50% stenosis, followed by small aneurysmal areas Three vessel runoff below the knee b/l Successful PTCA right SFA using chocolate balloon 5.0X120 mm 0% residual stenosis  EKG:   02/05/20: Sinus Rhythm. Nonspecific T-abnormality. Abnormal.  05/31/2019: Sinus rhythm with short PR interval, PR interval 116 ms.  Possible left atrial abnormality.  No evidence of ischemia, normal QT interval.  Assessment     ICD-10-CM   1. Claudication in peripheral vascular disease (HCC)  I73.9 PCV ANKLE BRACHIAL INDEX (ABI)    clopidogrel (PLAVIX) 75 MG tablet  2. Essential hypertension  I10 EKG 12-Lead    Meds ordered this encounter  Medications  . clopidogrel (PLAVIX) 75 MG tablet    Sig: Take 1 tablet (75 mg total) by mouth daily.    Dispense:  90 tablet    Refill:  3    Medications Discontinued During This Encounter  Medication Reason  . clopidogrel (PLAVIX) 75 MG tablet Reorder    Recommendations:   [name] [name]  is a  69 y.o. AA female  with history of left carotid endarterectomy due to symptomatic carotid stenosis in 2012 with TIA, asymptomatic right carotid stenosis, bronchial asthma, hypertension, hyperlipidemia, hyperglycemia presents for 1 month OV.  She had difficult to control hypertension which is now well  controlled on spironolactone, hypokalemia is resolved.    Underwent back surgery in July 2021 with resolution of back pain.  Due to severe symptoms of claudication involving the right leg, underwent peripheral arteriogram and angioplasty to right SFA on 08/27/2020, has noticed significant improvement in her walking distance and symptoms of claudication.  Otherwise her risk factors are well controlled including hypertension hyperlipidemia.  She has noticed significant improvement in symptoms of claudication.  I will continue aspirin 81 mg along with Plavix 75 mg daily.  Previously she was on Pletal this was discontinued after angioplasty.  I will see her back in 6 months. Will obtain ABI for baseline.    Adrian Prows, MD, [facility] 09/12/2020, 2:36 PM Office: [phone]

## 2020-09-13 ENCOUNTER — Other Ambulatory Visit: Payer: Self-pay

## 2020-09-13 ENCOUNTER — Encounter: Payer: Self-pay | Admitting: Family

## 2020-09-13 ENCOUNTER — Ambulatory Visit (INDEPENDENT_AMBULATORY_CARE_PROVIDER_SITE_OTHER): Payer: Medicare PPO | Admitting: Family

## 2020-09-13 VITALS — BP 120/68 | HR 66 | Temp 98.8°F | Ht 59.0 in | Wt 150.0 lb

## 2020-09-13 DIAGNOSIS — Z0000 Encounter for general adult medical examination without abnormal findings: Secondary | ICD-10-CM | POA: Diagnosis not present

## 2020-09-13 DIAGNOSIS — E785 Hyperlipidemia, unspecified: Secondary | ICD-10-CM

## 2020-09-13 DIAGNOSIS — Z23 Encounter for immunization: Secondary | ICD-10-CM

## 2020-09-13 DIAGNOSIS — F419 Anxiety disorder, unspecified: Secondary | ICD-10-CM

## 2020-09-13 DIAGNOSIS — I10 Essential (primary) hypertension: Secondary | ICD-10-CM

## 2020-09-13 DIAGNOSIS — G2581 Restless legs syndrome: Secondary | ICD-10-CM

## 2020-09-13 MED ORDER — ATORVASTATIN CALCIUM 40 MG PO TABS: 40.0000 mg | ORAL_TABLET | Freq: Every day | ORAL | 3 refills | Status: DC

## 2020-09-13 MED ORDER — ROPINIROLE HCL 0.5 MG PO TABS: 0.5000 mg | ORAL_TABLET | Freq: Every day | ORAL | 3 refills | Status: DC

## 2020-09-13 MED ORDER — CITALOPRAM HYDROBROMIDE 20 MG PO TABS: 20.0000 mg | ORAL_TABLET | Freq: Every day | ORAL | 3 refills | Status: DC

## 2020-09-13 MED ORDER — MONTELUKAST SODIUM 10 MG PO TABS: 10.0000 mg | ORAL_TABLET | Freq: Every day | ORAL | 3 refills | Status: DC

## 2020-09-13 MED ORDER — PANTOPRAZOLE SODIUM 40 MG PO TBEC: 40.0000 mg | DELAYED_RELEASE_TABLET | Freq: Two times a day (BID) | ORAL | 3 refills | Status: DC

## 2020-09-13 MED ORDER — ZOLPIDEM TARTRATE 10 MG PO TABS: 10.0000 mg | ORAL_TABLET | Freq: Every evening | ORAL | 1 refills | Status: DC | PRN

## 2020-09-13 MED ORDER — BENAZEPRIL-HYDROCHLOROTHIAZIDE 10-12.5 MG PO TABS: 1.0000 | ORAL_TABLET | Freq: Every day | ORAL | 3 refills | Status: DC

## 2020-09-13 NOTE — Progress Notes (Signed)
[name] [name] is a 69 y.o. female with the following history as recorded in EpicCare:  Patient Active Problem List   Diagnosis Date Noted  . Claudication in peripheral vascular disease ([place]) 08/26/2020  . Neurogenic claudication ([place]) 07/10/2020  . Degenerative lumbar spinal stenosis 05/16/2020  . Sacroiliac pain 08/10/2019  . Anxiety and depression 05/11/2019  . Chest pain 05/09/2019  . Arthritis of sacroiliac joint of both sides 05/01/2019  . Polyarthralgia 12/28/2018  . Preventative health care 10/20/2018  . RLS (restless legs syndrome) 09/27/2018  . Insomnia disorder related to known organic factor 06/27/2018  . PLMD (periodic limb movement disorder) 06/27/2018  . Hot flashes due to menopause 04/25/2018  . Palpitations 04/25/2018  . Diaphoresis 04/25/2018  . Long term current use of antithrombotics/antiplatelets 04/19/2018  . Insomnia 04/19/2018  . Intractable episodic headache 02/28/2018  . Numbness and tingling of right arm 02/28/2018  . Toe pain, right 10/19/2017  . Rotator cuff arthropathy of left shoulder 06/02/2017  . Trigger thumb of left hand 06/02/2017  . Lipoma 05/06/2017  . Pain of left thumb 05/06/2017  . Chronic pain syndrome 12/15/2016  . Lumbar degenerative disc disease 12/15/2016  . Left leg pain 01/28/2016  . Left shoulder pain 04/09/2015  . Impaired glucose tolerance 07/30/2013  . Abnormal breath sounds 07/30/2013  . Left carotid stenosis 05/05/2012  . Anxiety 04/03/2011  . CVA (cerebral infarction) 03/15/2011  . HLD (hyperlipidemia) 11/03/2010  . Hx of adenomatous colonic polyps 02/09/2008  . Hypothyroidism 11/06/2007  . ALLERGIC RHINITIS 11/06/2007  . OSTEOPOROSIS 11/06/2007  . ALCOHOL ABUSE, HX OF 11/06/2007  . Essential hypertension 09/13/2007  . Asthma 09/13/2007  . PERIMENOPAUSAL STATUS 09/13/2007  . Chronic insomnia 09/13/2007    Current Outpatient Medications  Medication Sig Dispense Refill  . acetaminophen (TYLENOL) 650 MG CR tablet  Take 1,300 mg by mouth every 8 (eight) hours as needed for pain.    Marland Kitchen amLODipine (NORVASC) 5 MG tablet TAKE 1 TABLET (5 MG TOTAL) BY MOUTH DAILY AFTER SUPPER. 90 tablet 0  . aspirin EC 81 MG tablet Take 1 tablet (81 mg total) by mouth daily. 90 tablet 3  . atorvastatin (LIPITOR) 40 MG tablet Take 1 tablet (40 mg total) by mouth daily. 90 tablet 3  . benazepril-hydrochlorthiazide (LOTENSIN HCT) 10-12.5 MG tablet Take 1 tablet by mouth daily. 90 tablet 3  . Calcium Carbonate-Vit D-Min (CALCIUM 1200 PO) Take 1,200 mg by mouth daily.    . Cholecalciferol (VITAMIN D) 50 MCG (2000 UT) tablet Take 2,000 Units by mouth daily.    . citalopram (CELEXA) 20 MG tablet Take 1 tablet (20 mg total) by mouth daily. 90 tablet 3  . clopidogrel (PLAVIX) 75 MG tablet Take 1 tablet (75 mg total) by mouth daily. 90 tablet 3  . diphenhydrAMINE-APAP, sleep, (TYLENOL PM EXTRA STRENGTH PO) Take 2 tablets by mouth at bedtime.     . fluticasone furoate-vilanterol (BREO ELLIPTA) 100-25 MCG/INH AEPB Inhale 1 puff into the lungs daily. (Patient taking differently: Inhale 1 puff into the lungs daily as needed (shortness of breath). ) 28 each 2  . levothyroxine (SYNTHROID) 75 MCG tablet TAKE 1 TABLET (75 MCG TOTAL) BY MOUTH DAILY BEFORE BREAKFAST. 90 tablet 2  . metoprolol succinate (TOPROL-XL) 25 MG 24 hr tablet TAKE 1 TABLET BY MOUTH EVERY DAY (Patient taking differently: Take 25 mg by mouth daily. ) 90 tablet 3  . montelukast (SINGULAIR) 10 MG tablet Take 1 tablet (10 mg total) by mouth daily. 90 tablet 3  .  Multiple Vitamin (MULTIVITAMIN WITH MINERALS) TABS tablet Take 1 tablet by mouth daily.    . pantoprazole (PROTONIX) 40 MG tablet Take 1 tablet (40 mg total) by mouth 2 (two) times daily. 180 tablet 3  . rOPINIRole (REQUIP) 0.5 MG tablet Take 1 tablet (0.5 mg total) by mouth at bedtime. 90 tablet 3  . spironolactone (ALDACTONE) 25 MG tablet TAKE 1 TABLET BY MOUTH EVERY DAY IN THE MORNING (Patient taking differently: Take 25  mg by mouth daily. ) 90 tablet 0  . zolpidem (AMBIEN) 10 MG tablet Take 1 tablet (10 mg total) by mouth at bedtime as needed. for sleep 90 tablet 1  . ezetimibe (ZETIA) 10 MG tablet TAKE 1 TABLET (10 MG TOTAL) BY MOUTH DAILY AFTER SUPPER. 90 tablet 3   No current facility-administered medications for this visit.    Allergies: Fosamax [alendronate sodium] and Penicillins  Past Medical History:  Diagnosis Date  . Acid reflux   . ALCOHOL ABUSE, HX OF 11/06/2007  . ALLERGIC RHINITIS 11/06/2007  . Anxiety 04/03/2011  . ASTHMA 09/13/2007  . Asthma   . Chronic pain syndrome 12/15/2016  . COLONIC POLYPS, HX OF 02/09/2008    ADENOMATOUS POLYP  . Encounter for well adult exam without abnormal findings 04/03/2011  . HYPERLIPIDEMIA 11/03/2010  . HYPERTENSION 09/13/2007  . HYPOTHYROIDISM 11/06/2007  . Impaired glucose tolerance 07/30/2013  . INSOMNIA, HX OF 09/13/2007  . Lumbar degenerative disc disease 12/15/2016  . OSTEOPOROSIS 11/06/2007  . PERIMENOPAUSAL STATUS 09/13/2007  . RLS (restless legs syndrome)   . Stroke ([place])   . TIA (transient ischemic attack) 11/04/2011    Past Surgical History:  Procedure Laterality Date  . ABDOMINAL AORTOGRAM W/LOWER EXTREMITY N/A 08/27/2020   Procedure: ABDOMINAL AORTOGRAM W/LOWER EXTREMITY;  Surgeon: Nigel Mormon, MD;  Location: [facility];  Service: Cardiovascular;  Laterality: N/A;  . BREAST EXCISIONAL BIOPSY Left   . BREAST SURGERY  2008 and 2012   x 2 - benign, left side  . carotid artery surgery Left   . CESAREAN SECTION     x 3  . COLONOSCOPY  multiple   2010  . PERIPHERAL VASCULAR BALLOON ANGIOPLASTY  08/27/2020   Procedure: PERIPHERAL VASCULAR BALLOON ANGIOPLASTY;  Surgeon: Nigel Mormon, MD;  Location: [facility];  Service: Cardiovascular;;  Right SFA scoring balloon  . TRANSFORAMINAL LUMBAR INTERBODY FUSION (TLIF) WITH PEDICLE SCREW FIXATION 1 LEVEL Left 07/10/2020   Procedure: LEFT-SIDED LUMBAR FOUR-FIVE TRANSFORAMINAL  LUMBAR INTERBODY FUSION WITH INSTRUMENTATION AND ALLOGRAFT;  Surgeon: Phylliss Bob, MD;  Location: [facility];  Service: Orthopedics;  Laterality: Left;    Family History  Problem Relation Age of Onset  . Hyperlipidemia Other   . Hypertension Other   . Coronary artery disease Other   . Breast cancer Cousin   . Hypertension Mother   . Heart disease Mother   . Heart failure Mother   . Dementia Maternal Grandmother   . Colon cancer Neg Hx     Social History   Tobacco Use  . Smoking status: Former Smoker    Packs/day: 0.25    Types: Cigarettes    Quit date: 05/31/1979    Years since quitting: 41.3  . Smokeless tobacco: Current User    Types: Snuff  . Tobacco comment: social smoker  Substance Use Topics  . Alcohol use: No    Comment: former alcholic    Subjective:  Patient presents for yearly CPE; feeling very good- has had successful back surgery and vascular  surgery this year; would like to get her flu shot updated; will be doing her AWV in the next 2 weeks; does need refills on her medication;  Review of Systems  Constitutional: Negative.   HENT: Negative.   Eyes: Negative.   Respiratory: Negative.   Cardiovascular: Negative.   Gastrointestinal: Negative.   Genitourinary: Negative.   Musculoskeletal: Negative.   Skin: Negative.   Neurological: Negative.   Endo/Heme/Allergies: Negative.   Psychiatric/Behavioral: Negative.      Objective:  Vitals:   09/13/20 1500  BP: 120/68  Pulse: 66  Temp: 98.8 F (37.1 C)  TempSrc: Oral  SpO2: 99%  Weight: 150 lb (68 kg)  Height: 4\' 11"  (1.499 m)    General: Well developed, well nourished, in no acute distress  Skin : Warm and dry.  Head: Normocephalic and atraumatic  Eyes: Sclera and conjunctiva clear; pupils round and reactive to light; extraocular movements intact  Ears: External normal; canals clear; tympanic membranes normal  Oropharynx: Pink, supple. No suspicious lesions  Neck: Supple without thyromegaly,  adenopathy  Lungs: Respirations unlabored; clear to auscultation bilaterally without wheeze, rales, rhonchi  CVS exam: normal rate and regular rhythm.  Abdomen: Soft; nontender; nondistended; normoactive bowel sounds; no masses or hepatosplenomegaly  Musculoskeletal: No deformities; no active joint inflammation  Extremities: No edema, cyanosis, clubbing  Vessels: Symmetric bilaterally  Neurologic: Alert and oriented; speech intact; face symmetrical; moves all extremities well; CNII-XII intact without focal deficit   Assessment:  1. PE (physical exam), annual   2. Needs flu shot   3. Hyperlipidemia, unspecified hyperlipidemia type   4. Anxiety   5. RLS (restless legs syndrome)   6. Essential hypertension     Plan:  Age appropriate preventive healthcare needs addressed; encouraged regular eye doctor and dental exams; encouraged regular exercise; will update labs and refills as needed today; follow-up to be determined; Flu shot updated;  Follow-up in 1 year here, sooner prn; continue with specialists as scheduled.  This visit occurred during the SARS-CoV-2 public health emergency.  Safety protocols were in place, including screening questions prior to the visit, additional usage of staff PPE, and extensive cleaning of exam room while observing appropriate contact time as indicated for disinfecting solutions.     No follow-ups on file.  Orders Placed This Encounter  Procedures  . Flu Vaccine QUAD High Dose(Fluad)    Requested Prescriptions   Signed Prescriptions Disp Refills  . rOPINIRole (REQUIP) 0.5 MG tablet 90 tablet 3    Sig: Take 1 tablet (0.5 mg total) by mouth at bedtime.  . pantoprazole (PROTONIX) 40 MG tablet 180 tablet 3    Sig: Take 1 tablet (40 mg total) by mouth 2 (two) times daily.  Marland Kitchen atorvastatin (LIPITOR) 40 MG tablet 90 tablet 3    Sig: Take 1 tablet (40 mg total) by mouth daily.  . benazepril-hydrochlorthiazide (LOTENSIN HCT) 10-12.5 MG tablet 90 tablet 3     Sig: Take 1 tablet by mouth daily.  . citalopram (CELEXA) 20 MG tablet 90 tablet 3    Sig: Take 1 tablet (20 mg total) by mouth daily.  . montelukast (SINGULAIR) 10 MG tablet 90 tablet 3    Sig: Take 1 tablet (10 mg total) by mouth daily.  Marland Kitchen zolpidem (AMBIEN) 10 MG tablet 90 tablet 1    Sig: Take 1 tablet (10 mg total) by mouth at bedtime as needed. for sleep

## 2020-09-17 ENCOUNTER — Ambulatory Visit: Payer: Medicare PPO | Admitting: Psychology

## 2020-09-23 ENCOUNTER — Ambulatory Visit (INDEPENDENT_AMBULATORY_CARE_PROVIDER_SITE_OTHER): Payer: Medicare PPO

## 2020-09-23 DIAGNOSIS — Z0000 Encounter for general adult medical examination without abnormal findings: Secondary | ICD-10-CM | POA: Diagnosis not present

## 2020-09-23 NOTE — Patient Instructions (Addendum)
[name] [name] , Thank you for taking time to come for your Medicare Wellness Visit. I appreciate your ongoing commitment to your health goals. Please review the following plan we discussed and let me know if I can assist you in the future.   Screening recommendations/referrals: Colonoscopy: 06/30/2018; due every 5 years Mammogram: 09/11/2020; due every year Bone Density: 10/30/2019; due every 2 years Recommended yearly ophthalmology/optometry visit for glaucoma screening and checkup Recommended yearly dental visit for hygiene and checkup  Vaccinations: Influenza vaccine: 09/13/2020 Pneumococcal vaccine: completed Tdap vaccine: 11/01/2009; due every 10 years; overdue Shingles vaccine: never done   Covid-19: completed  Advanced directives: Please bring a copy of your health care power of attorney and living will to the office at your convenience.  Conditions/risks identified: Yes; Reviewed health maintenance screenings with patient today and relevant education, vaccines, and/or referrals were provided. Please continue to do your personal lifestyle choices by: daily care of teeth and gums, regular physical activity (goal should be 5 days a week for 30 minutes), eat a healthy diet, avoid tobacco and drug use, limiting any alcohol intake, taking a low-dose aspirin (if not allergic or have been advised by your provider otherwise) and taking vitamins and minerals as recommended by your provider. Continue doing brain stimulating activities (puzzles, reading, adult coloring books, staying active) to keep memory sharp. Continue to eat heart healthy diet (full of fruits, vegetables, whole grains, lean protein, water--limit salt, fat, and sugar intake) and increase physical activity as tolerated.  Next appointment: Please schedule your next Medicare Wellness Visit with your Nurse Health Advisor in 1 year by calling [phone].  Preventive Care 1 Years and Older, Female Preventive care refers to lifestyle  choices and visits with your health care provider that can promote health and wellness. What does preventive care include?  A yearly physical exam. This is also called an annual well check.  Dental exams once or twice a year.  Routine eye exams. Ask your health care provider how often you should have your eyes checked.  Personal lifestyle choices, including:  Daily care of your teeth and gums.  Regular physical activity.  Eating a healthy diet.  Avoiding tobacco and drug use.  Limiting alcohol use.  Practicing safe sex.  Taking low-dose aspirin every day.  Taking vitamin and mineral supplements as recommended by your health care provider. What happens during an annual well check? The services and screenings done by your health care provider during your annual well check will depend on your age, overall health, lifestyle risk factors, and family history of disease. Counseling  Your health care provider may ask you questions about your:  Alcohol use.  Tobacco use.  Drug use.  Emotional well-being.  Home and relationship well-being.  Sexual activity.  Eating habits.  History of falls.  Memory and ability to understand (cognition).  Work and work Statistician.  Reproductive health. Screening  You may have the following tests or measurements:  Height, weight, and BMI.  Blood pressure.  Lipid and cholesterol levels. These may be checked every 5 years, or more frequently if you are over 81 years old.  Skin check.  Lung cancer screening. You may have this screening every year starting at age 87 if you have a 30-pack-year history of smoking and currently smoke or have quit within the past 15 years.  Fecal occult blood test (FOBT) of the stool. You may have this test every year starting at age 54.  Flexible sigmoidoscopy or colonoscopy. You may have a  sigmoidoscopy every 5 years or a colonoscopy every 10 years starting at age 48.  Hepatitis C blood  test.  Hepatitis B blood test.  Sexually transmitted disease (STD) testing.  Diabetes screening. This is done by checking your blood sugar (glucose) after you have not eaten for a while (fasting). You may have this done every 1-3 years.  Bone density scan. This is done to screen for osteoporosis. You may have this done starting at age 9.  Mammogram. This may be done every 1-2 years. Talk to your health care provider about how often you should have regular mammograms. Talk with your health care provider about your test results, treatment options, and if necessary, the need for more tests. Vaccines  Your health care provider may recommend certain vaccines, such as:  Influenza vaccine. This is recommended every year.  Tetanus, diphtheria, and acellular pertussis (Tdap, Td) vaccine. You may need a Td booster every 10 years.  Zoster vaccine. You may need this after age 39.  Pneumococcal 13-valent conjugate (PCV13) vaccine. One dose is recommended after age 16.  Pneumococcal polysaccharide (PPSV23) vaccine. One dose is recommended after age 62. Talk to your health care provider about which screenings and vaccines you need and how often you need them. This information is not intended to replace advice given to you by your health care provider. Make sure you discuss any questions you have with your health care provider. Document Released: 12/27/2015 Document Revised: 08/19/2016 Document Reviewed: 10/01/2015 Elsevier Interactive Patient Education  2017 [place] Prevention in the Home Falls can cause injuries. They can happen to people of all ages. There are many things you can do to make your home safe and to help prevent falls. What can I do on the outside of my home?  Regularly fix the edges of walkways and driveways and fix any cracks.  Remove anything that might make you trip as you walk through a door, such as a raised step or threshold.  Trim any bushes or trees on the  path to your home.  Use bright outdoor lighting.  Clear any walking paths of anything that might make someone trip, such as rocks or tools.  Regularly check to see if handrails are loose or broken. Make sure that both sides of any steps have handrails.  Any raised decks and porches should have guardrails on the edges.  Have any leaves, snow, or ice cleared regularly.  Use sand or salt on walking paths during winter.  Clean up any spills in your garage right away. This includes oil or grease spills. What can I do in the bathroom?  Use night lights.  Install grab bars by the toilet and in the tub and shower. Do not use towel bars as grab bars.  Use non-skid mats or decals in the tub or shower.  If you need to sit down in the shower, use a plastic, non-slip stool.  Keep the floor dry. Clean up any water that spills on the floor as soon as it happens.  Remove soap buildup in the tub or shower regularly.  Attach bath mats securely with double-sided non-slip rug tape.  Do not have throw rugs and other things on the floor that can make you trip. What can I do in the bedroom?  Use night lights.  Make sure that you have a light by your bed that is easy to reach.  Do not use any sheets or blankets that are too big for your bed. They  should not hang down onto the floor.  Have a firm chair that has side arms. You can use this for support while you get dressed.  Do not have throw rugs and other things on the floor that can make you trip. What can I do in the kitchen?  Clean up any spills right away.  Avoid walking on wet floors.  Keep items that you use a lot in easy-to-reach places.  If you need to reach something above you, use a strong step stool that has a grab bar.  Keep electrical cords out of the way.  Do not use floor polish or wax that makes floors slippery. If you must use wax, use non-skid floor wax.  Do not have throw rugs and other things on the floor that can  make you trip. What can I do with my stairs?  Do not leave any items on the stairs.  Make sure that there are handrails on both sides of the stairs and use them. Fix handrails that are broken or loose. Make sure that handrails are as long as the stairways.  Check any carpeting to make sure that it is firmly attached to the stairs. Fix any carpet that is loose or worn.  Avoid having throw rugs at the top or bottom of the stairs. If you do have throw rugs, attach them to the floor with carpet tape.  Make sure that you have a light switch at the top of the stairs and the bottom of the stairs. If you do not have them, ask someone to add them for you. What else can I do to help prevent falls?  Wear shoes that:  Do not have high heels.  Have rubber bottoms.  Are comfortable and fit you well.  Are closed at the toe. Do not wear sandals.  If you use a stepladder:  Make sure that it is fully opened. Do not climb a closed stepladder.  Make sure that both sides of the stepladder are locked into place.  Ask someone to hold it for you, if possible.  Clearly mark and make sure that you can see:  Any grab bars or handrails.  First and last steps.  Where the edge of each step is.  Use tools that help you move around (mobility aids) if they are needed. These include:  Canes.  Walkers.  Scooters.  Crutches.  Turn on the lights when you go into a dark area. Replace any light bulbs as soon as they burn out.  Set up your furniture so you have a clear path. Avoid moving your furniture around.  If any of your floors are uneven, fix them.  If there are any pets around you, be aware of where they are.  Review your medicines with your doctor. Some medicines can make you feel dizzy. This can increase your chance of falling. Ask your doctor what other things that you can do to help prevent falls. This information is not intended to replace advice given to you by your health care  provider. Make sure you discuss any questions you have with your health care provider. Document Released: 09/26/2009 Document Revised: 05/07/2016 Document Reviewed: 01/04/2015 Elsevier Interactive Patient Education  2017 [employer].

## 2020-09-23 NOTE — Progress Notes (Addendum)
I connected with Clydell Winski today by telephone and verified that I am speaking with the correct person using two identifiers. Location patient: home Location provider: work Persons participating in the virtual visit: [name], LPN.   I discussed the limitations, risks, security and privacy concerns of performing an evaluation and management service by telephone and the availability of in person appointments. I also discussed with the patient that there may be a patient responsible charge related to this service. The patient expressed understanding and verbally consented to this telephonic visit.    Interactive audio and video telecommunications were attempted between this provider and patient, however failed, due to patient having technical difficulties OR patient did not have access to video capability.  We continued and completed visit with audio only.  Some vital signs may be absent or patient reported.   Time Spent with patient on telephone encounter: 30 minutes  Subjective:   [name] [name] is a 69 y.o. female who presents for Medicare Annual (Subsequent) preventive examination.  Review of Systems    No ROS. Medicare Wellness Visit. Cardiac Risk Factors include: advanced age (>35men, >25 women);dyslipidemia;family history of premature cardiovascular disease;hypertension;obesity (BMI >30kg/m2)     Objective:    There were no vitals filed for this visit. There is no height or weight on file to calculate BMI.  Advanced Directives 09/23/2020 08/27/2020 07/10/2020 07/04/2020 05/09/2019 02/14/2019 06/30/2018  Does Patient Have a Medical Advance Directive? Yes No Yes Yes No No Yes  Type of Advance Directive Living will - Living will Living will - - [place];Living will;Out of facility DNR (pink MOST or yellow form)  Does patient want to make changes to medical advance directive? No - Patient declined - No - Patient declined - - - No - Patient  declined  Copy of [place] in Chart? - - - - - - No - copy requested  Would patient like information on creating a medical advance directive? - No - Patient declined - - Yes (ED - Information included in AVS) No - Patient declined -    Current Medications (verified) Outpatient Encounter Medications as of 09/23/2020  Medication Sig  . acetaminophen (TYLENOL) 650 MG CR tablet Take 1,300 mg by mouth every 8 (eight) hours as needed for pain.  Marland Kitchen amLODipine (NORVASC) 5 MG tablet TAKE 1 TABLET (5 MG TOTAL) BY MOUTH DAILY AFTER SUPPER.  Marland Kitchen aspirin EC 81 MG tablet Take 1 tablet (81 mg total) by mouth daily.  Marland Kitchen atorvastatin (LIPITOR) 40 MG tablet Take 1 tablet (40 mg total) by mouth daily.  . benazepril-hydrochlorthiazide (LOTENSIN HCT) 10-12.5 MG tablet Take 1 tablet by mouth daily.  . Calcium Carbonate-Vit D-Min (CALCIUM 1200 PO) Take 1,200 mg by mouth daily.  . Cholecalciferol (VITAMIN D) 50 MCG (2000 UT) tablet Take 2,000 Units by mouth daily.  . citalopram (CELEXA) 20 MG tablet Take 1 tablet (20 mg total) by mouth daily.  . clopidogrel (PLAVIX) 75 MG tablet Take 1 tablet (75 mg total) by mouth daily.  . diphenhydrAMINE-APAP, sleep, (TYLENOL PM EXTRA STRENGTH PO) Take 2 tablets by mouth at bedtime.   Marland Kitchen ezetimibe (ZETIA) 10 MG tablet TAKE 1 TABLET (10 MG TOTAL) BY MOUTH DAILY AFTER SUPPER.  . fluticasone furoate-vilanterol (BREO ELLIPTA) 100-25 MCG/INH AEPB Inhale 1 puff into the lungs daily. (Patient taking differently: Inhale 1 puff into the lungs daily as needed (shortness of breath). )  . levothyroxine (SYNTHROID) 75 MCG tablet TAKE 1  TABLET (75 MCG TOTAL) BY MOUTH DAILY BEFORE BREAKFAST.  . metoprolol succinate (TOPROL-XL) 25 MG 24 hr tablet TAKE 1 TABLET BY MOUTH EVERY DAY (Patient taking differently: Take 25 mg by mouth daily. )  . montelukast (SINGULAIR) 10 MG tablet Take 1 tablet (10 mg total) by mouth daily.  . Multiple Vitamin (MULTIVITAMIN WITH MINERALS) TABS tablet  Take 1 tablet by mouth daily.  . pantoprazole (PROTONIX) 40 MG tablet Take 1 tablet (40 mg total) by mouth 2 (two) times daily.  Marland Kitchen rOPINIRole (REQUIP) 0.5 MG tablet Take 1 tablet (0.5 mg total) by mouth at bedtime.  Marland Kitchen spironolactone (ALDACTONE) 25 MG tablet TAKE 1 TABLET BY MOUTH EVERY DAY IN THE MORNING (Patient taking differently: Take 25 mg by mouth daily. )  . zolpidem (AMBIEN) 10 MG tablet Take 1 tablet (10 mg total) by mouth at bedtime as needed. for sleep   No facility-administered encounter medications on file as of 09/23/2020.    Allergies (verified) Fosamax [alendronate sodium] and Penicillins   History: Past Medical History:  Diagnosis Date  . Acid reflux   . ALCOHOL ABUSE, HX OF 11/06/2007  . ALLERGIC RHINITIS 11/06/2007  . Anxiety 04/03/2011  . ASTHMA 09/13/2007  . Asthma   . Chronic pain syndrome 12/15/2016  . COLONIC POLYPS, HX OF 02/09/2008    ADENOMATOUS POLYP  . Encounter for well adult exam without abnormal findings 04/03/2011  . HYPERLIPIDEMIA 11/03/2010  . HYPERTENSION 09/13/2007  . HYPOTHYROIDISM 11/06/2007  . Impaired glucose tolerance 07/30/2013  . INSOMNIA, HX OF 09/13/2007  . Lumbar degenerative disc disease 12/15/2016  . OSTEOPOROSIS 11/06/2007  . PERIMENOPAUSAL STATUS 09/13/2007  . RLS (restless legs syndrome)   . Stroke ([place])   . TIA (transient ischemic attack) 11/04/2011   Past Surgical History:  Procedure Laterality Date  . ABDOMINAL AORTOGRAM W/LOWER EXTREMITY N/A 08/27/2020   Procedure: ABDOMINAL AORTOGRAM W/LOWER EXTREMITY;  Surgeon: Nigel Mormon, MD;  Location: [facility];  Service: Cardiovascular;  Laterality: N/A;  . BREAST EXCISIONAL BIOPSY Left   . BREAST SURGERY  2008 and 2012   x 2 - benign, left side  . carotid artery surgery Left   . CESAREAN SECTION     x 3  . COLONOSCOPY  multiple   2010  . PERIPHERAL VASCULAR BALLOON ANGIOPLASTY  08/27/2020   Procedure: PERIPHERAL VASCULAR BALLOON ANGIOPLASTY;  Surgeon: Nigel Mormon, MD;  Location: [facility];  Service: Cardiovascular;;  Right SFA scoring balloon  . TRANSFORAMINAL LUMBAR INTERBODY FUSION (TLIF) WITH PEDICLE SCREW FIXATION 1 LEVEL Left 07/10/2020   Procedure: LEFT-SIDED LUMBAR FOUR-FIVE TRANSFORAMINAL LUMBAR INTERBODY FUSION WITH INSTRUMENTATION AND ALLOGRAFT;  Surgeon: Phylliss Bob, MD;  Location: [facility];  Service: Orthopedics;  Laterality: Left;   Family History  Problem Relation Age of Onset  . Hyperlipidemia Other   . Hypertension Other   . Coronary artery disease Other   . Breast cancer Cousin   . Hypertension Mother   . Heart disease Mother   . Heart failure Mother   . Dementia Maternal Grandmother   . Colon cancer Neg Hx    Social History   Socioeconomic History  . Marital status: Divorced    Spouse name: Not on file  . Number of children: 3  . Years of education: Not on file  . Highest education level: Not on file  Occupational History  . Occupation: Nurse, mental health: [employer]  Tobacco Use  . Smoking status: Former Smoker  Packs/day: 0.25    Types: Cigarettes    Quit date: 05/31/1979    Years since quitting: 41.3  . Smokeless tobacco: Current User    Types: Snuff  . Tobacco comment: social smoker  Vaping Use  . Vaping Use: Never used  Substance and Sexual Activity  . Alcohol use: No    Comment: former alcholic  . Drug use: No  . Sexual activity: Not on file  Other Topics Concern  . 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 Difficulty of Paying Living Expenses: Not hard at all  Food Insecurity: No Food Insecurity  . Worried About Charity fundraiser in the Last Year: Never true  . Ran Out of Food in the Last Year: Never true  Transportation Needs: No  Transportation Needs  . Lack of Transportation (Medical): No  . Lack of Transportation (Non-Medical): No  Physical Activity: Sufficiently Active  . Days of Exercise per Week: 5 days  . Minutes of Exercise per Session: 30 min  Stress: No Stress Concern Present  . Feeling of Stress : Not at all  Social Connections: Unknown  . Frequency of Communication with Friends and Family: More than three times a week  . Frequency of Social Gatherings with Friends and Family: More than three times a week  . Attends Religious Services: More than 4 times per year  . Active Member of Clubs or Organizations: Yes  . Attends Archivist Meetings: More than 4 times per year  . Marital Status: Patient refused    Tobacco Counseling Ready to quit: Not Answered Counseling given: Not Answered Comment: social smoker   Clinical Intake:  Pre-visit preparation completed: Yes  Pain : No/denies pain     Nutritional Risks: None Diabetes: No  How often do you need to have someone help you when you read instructions, pamphlets, or other written materials from your doctor or pharmacy?: 1 - Never What is the last grade level you completed in school?: HSG  Diabetic? no  Interpreter Needed?: No  Information entered by :: Lisette Abu, LPN   Activities of Daily Living In your present state of health, do you have any difficulty performing the following activities: 09/23/2020 07/04/2020  Hearing? N N  Vision? N N  Difficulty concentrating or making decisions? N N  Walking or climbing stairs? N Y  Dressing or bathing? N N  Doing errands, shopping? N N  Preparing Food and eating ? N -  Using the Toilet? N -  In the past six months, have you accidently leaked urine? N -  Do you have problems with loss of bowel control? N -  Managing your Medications? N -  Managing your Finances? N -  Housekeeping or managing your Housekeeping? N -  Some recent data might be hidden    Patient Care  Team: Marrian Salvage, [place] as PCP - General (Internal Medicine)  Indicate any recent Medical Services you may have received from other than [facility] providers in the past year (date may be approximate).     Assessment:   This is a routine wellness examination for Dwight.  Hearing/Vision screen No exam data present  Dietary issues and exercise activities discussed: Current Exercise Habits: Home exercise routine;Structured  exercise class (Joined [employer] (swim lessons)), Type of exercise: walking;Other - see comments (joined [employer]), Time (Minutes): 30, Frequency (Times/Week): 5, Weekly Exercise (Minutes/Week): 150, Intensity: Moderate, Exercise limited by: orthopedic condition(s);respiratory conditions(s);cardiac condition(s)  Goals   None    Depression Screen PHQ 2/9 Scores 09/23/2020 09/13/2020 04/20/2019 10/19/2017 12/15/2016 10/10/2014  PHQ - 2 Score 0 0 1 0 1 0    Fall Risk Fall Risk  09/23/2020 04/29/2020 04/20/2019 10/20/2018 10/19/2017  Falls in the past year? 0 1 0 0 No  Number falls in past yr: 0 1 - - -  Injury with Fall? 0 0 - - -  Risk for fall due to : No Fall Risks No Fall Risks - - -  Follow up Falls evaluation completed Falls evaluation completed - - -    Any stairs in or around the home? No  If so, are there any without handrails? No  Home free of loose throw rugs in walkways, pet beds, electrical cords, etc? Yes  Adequate lighting in your home to reduce risk of falls? Yes   ASSISTIVE DEVICES UTILIZED TO PREVENT FALLS:  Life alert? No  Use of a cane, walker or w/c? Yes  Grab bars in the bathroom? Yes  Shower chair or bench in shower? No  Elevated toilet seat or a handicapped toilet? No   TIMED UP AND GO:  Was the test performed? No .  Length of time to ambulate 10 feet: 0 sec.   Gait steady and fast with assistive device  Cognitive Function: Patient is cogitatively intact.        Immunizations Immunization History  Administered  Date(s) Administered  . Fluad Quad(high Dose 65+) 10/16/2019, 09/13/2020  . Influenza Split 09/13/2012  . Influenza Whole 09/29/2006, 10/17/2007, 10/14/2008, 08/14/2009, 09/29/2010  . Influenza, High Dose Seasonal PF 09/12/2017, 10/20/2018  . Influenza,inj,Quad PF,6+ Mos 10/10/2014  . PFIZER SARS-COV-2 Vaccination 02/11/2020, 02/27/2020  . Pneumococcal Conjugate-13 07/03/2015  . Pneumococcal Polysaccharide-23 10/14/2008, 10/19/2017  . Td 11/01/2009    TDAP status: Due, Education has been provided regarding the importance of this vaccine. Advised may receive this vaccine at local pharmacy or Health Dept. Aware to provide a copy of the vaccination record if obtained from local pharmacy or Health Dept. Verbalized acceptance and understanding. Flu Vaccine status: Up to date Pneumococcal vaccine status: Up to date Covid-19 vaccine status: Completed vaccines  Qualifies for Shingles Vaccine? Yes   Zostavax completed No   Shingrix Completed?: No.    Education has been provided regarding the importance of this vaccine. Patient has been advised to call insurance company to determine out of pocket expense if they have not yet received this vaccine. Advised may also receive vaccine at local pharmacy or Health Dept. Verbalized acceptance and understanding.  Screening Tests Health Maintenance  Topic Date Due  . TETANUS/TDAP  09/13/2021 (Originally 11/02/2019)  . MAMMOGRAM  09/11/2022  . COLONOSCOPY  07/01/2023  . INFLUENZA VACCINE  Completed  . DEXA SCAN  Completed  . COVID-19 Vaccine  Completed  . Hepatitis C Screening  Completed  . PNA vac Low Risk Adult  Completed    Health Maintenance  There are no preventive care reminders to display for this patient.  Colorectal cancer screening: Completed 06/30/2018. Repeat every 5 years Mammogram status: Completed 09/11/2020. Repeat every year Bone Density status: Completed 10/30/2019. Results reflect: Bone density results: OSTEOPENIA. Repeat every 2  years.  Lung Cancer Screening: (Low Dose CT Chest recommended if Age 72-80 years, 30 pack-year currently  smoking OR have quit w/in 15years.) does not qualify.   Lung Cancer Screening Referral: no  Additional Screening:  Hepatitis C Screening: does qualify; Completed no  Vision Screening: Recommended annual ophthalmology exams for early detection of glaucoma and other disorders of the eye. Is the patient up to date with their annual eye exam?  No  Who is the provider or what is the name of the office in which the patient attends annual eye exams? Patient refused referral for routine eye exam. If pt is not established with a provider, would they like to be referred to a provider to establish care? No .   Dental Screening: Recommended annual dental exams for proper oral hygiene  Community Resource Referral / Chronic Care Management: CRR required this visit?  No   CCM required this visit?  No      Plan:     I have personally reviewed and noted the following in the patient's chart:   . Medical and social history . Use of alcohol, tobacco or illicit drugs  . Current medications and supplements . Functional ability and status . Nutritional status . Physical activity . Advanced directives . List of other physicians . Hospitalizations, surgeries, and ER visits in previous 12 months . Vitals . Screenings to include cognitive, depression, and falls . Referrals and appointments  In addition, I have reviewed and discussed with patient certain preventive protocols, quality metrics, and best practice recommendations. A written personalized care plan for preventive services as well as general preventive health recommendations were provided to patient.     Sheral Flow, LPN   12/75/1700   Nurse Notes:  Patient is cogitatively intact. There were no vitals filed for this visit. There is no height or weight on file to calculate BMI. Patient stated that she has issues with gait  andbalance; does use a cane for assistive device.   Medical screening examination/treatment/procedure(s) were performed by non-physician practitioner and as supervising provider I was immediately available for consultation/collaboration.  I agree with above. Marrian Salvage, FNP

## 2020-09-24 ENCOUNTER — Ambulatory Visit: Payer: Medicare PPO | Admitting: Psychology

## 2020-10-01 ENCOUNTER — Ambulatory Visit: Payer: Medicare Other | Admitting: Family Medicine

## 2020-10-01 ENCOUNTER — Ambulatory Visit (INDEPENDENT_AMBULATORY_CARE_PROVIDER_SITE_OTHER): Payer: Medicare PPO | Admitting: Psychology

## 2020-10-01 DIAGNOSIS — F330 Major depressive disorder, recurrent, mild: Secondary | ICD-10-CM

## 2020-10-02 ENCOUNTER — Other Ambulatory Visit: Payer: Self-pay

## 2020-10-02 ENCOUNTER — Ambulatory Visit: Payer: Medicare PPO

## 2020-10-02 DIAGNOSIS — I739 Peripheral vascular disease, unspecified: Secondary | ICD-10-CM

## 2020-10-08 ENCOUNTER — Ambulatory Visit (INDEPENDENT_AMBULATORY_CARE_PROVIDER_SITE_OTHER): Payer: Medicare PPO | Admitting: Psychology

## 2020-10-08 DIAGNOSIS — F330 Major depressive disorder, recurrent, mild: Secondary | ICD-10-CM | POA: Diagnosis not present

## 2020-10-15 ENCOUNTER — Ambulatory Visit: Payer: Medicare PPO | Admitting: Psychology

## 2020-10-22 ENCOUNTER — Ambulatory Visit (INDEPENDENT_AMBULATORY_CARE_PROVIDER_SITE_OTHER): Payer: Medicare PPO | Admitting: Psychology

## 2020-10-22 DIAGNOSIS — F330 Major depressive disorder, recurrent, mild: Secondary | ICD-10-CM

## 2020-10-29 ENCOUNTER — Ambulatory Visit: Payer: Medicare PPO | Admitting: Psychology

## 2020-11-03 ENCOUNTER — Other Ambulatory Visit: Payer: Self-pay | Admitting: Cardiology

## 2020-11-03 DIAGNOSIS — I739 Peripheral vascular disease, unspecified: Secondary | ICD-10-CM

## 2020-11-04 ENCOUNTER — Other Ambulatory Visit: Payer: Self-pay | Admitting: Family

## 2020-11-04 MED ORDER — LEVOTHYROXINE SODIUM 75 MCG PO TABS: 75.0000 ug | ORAL_TABLET | Freq: Every day | ORAL | 2 refills | Status: DC

## 2020-11-05 ENCOUNTER — Ambulatory Visit (INDEPENDENT_AMBULATORY_CARE_PROVIDER_SITE_OTHER): Payer: Medicare PPO | Admitting: Psychology

## 2020-11-05 DIAGNOSIS — F330 Major depressive disorder, recurrent, mild: Secondary | ICD-10-CM

## 2020-11-12 ENCOUNTER — Ambulatory Visit: Payer: Medicare PPO | Admitting: Psychology

## 2020-11-19 ENCOUNTER — Ambulatory Visit (INDEPENDENT_AMBULATORY_CARE_PROVIDER_SITE_OTHER): Payer: Medicare PPO | Admitting: Psychology

## 2020-11-19 DIAGNOSIS — F330 Major depressive disorder, recurrent, mild: Secondary | ICD-10-CM

## 2020-11-26 ENCOUNTER — Other Ambulatory Visit: Payer: Self-pay | Admitting: Cardiology

## 2020-11-26 ENCOUNTER — Ambulatory Visit: Payer: Medicare PPO | Admitting: Psychology

## 2020-11-26 DIAGNOSIS — I10 Essential (primary) hypertension: Secondary | ICD-10-CM

## 2020-12-03 ENCOUNTER — Other Ambulatory Visit: Payer: Self-pay | Admitting: Cardiology

## 2020-12-03 ENCOUNTER — Ambulatory Visit: Payer: Medicare PPO | Admitting: Psychology

## 2020-12-03 DIAGNOSIS — I10 Essential (primary) hypertension: Secondary | ICD-10-CM

## 2020-12-03 DIAGNOSIS — E7800 Pure hypercholesterolemia, unspecified: Secondary | ICD-10-CM

## 2020-12-03 DIAGNOSIS — I739 Peripheral vascular disease, unspecified: Secondary | ICD-10-CM

## 2020-12-04 IMAGING — RF DG C-ARM 1-60 MIN
1 series · 2 of 2 positions shown · non-contrast
Comparison: Lumbar spine evaluation from July 10, 2020
COMPARISON: Lumbar spine evaluation from July 10, 2020

Addendum:
CLINICAL DATA: LEFT-sided transforaminal lumbar interbody fusion

EXAM:
DG C-ARM 1-60 MIN; LUMBAR SPINE - 2-3 VIEW
FLUOROSCOPY TIME:  Fluoroscopy Time:  36.1 seconds
Radiation Exposure Index (if provided by the fluoroscopic device):
16.85 mGy
Number of Acquired Spot Images: 0

[Series 1: run · 2 of 2 slices shown]
[im 1/2]
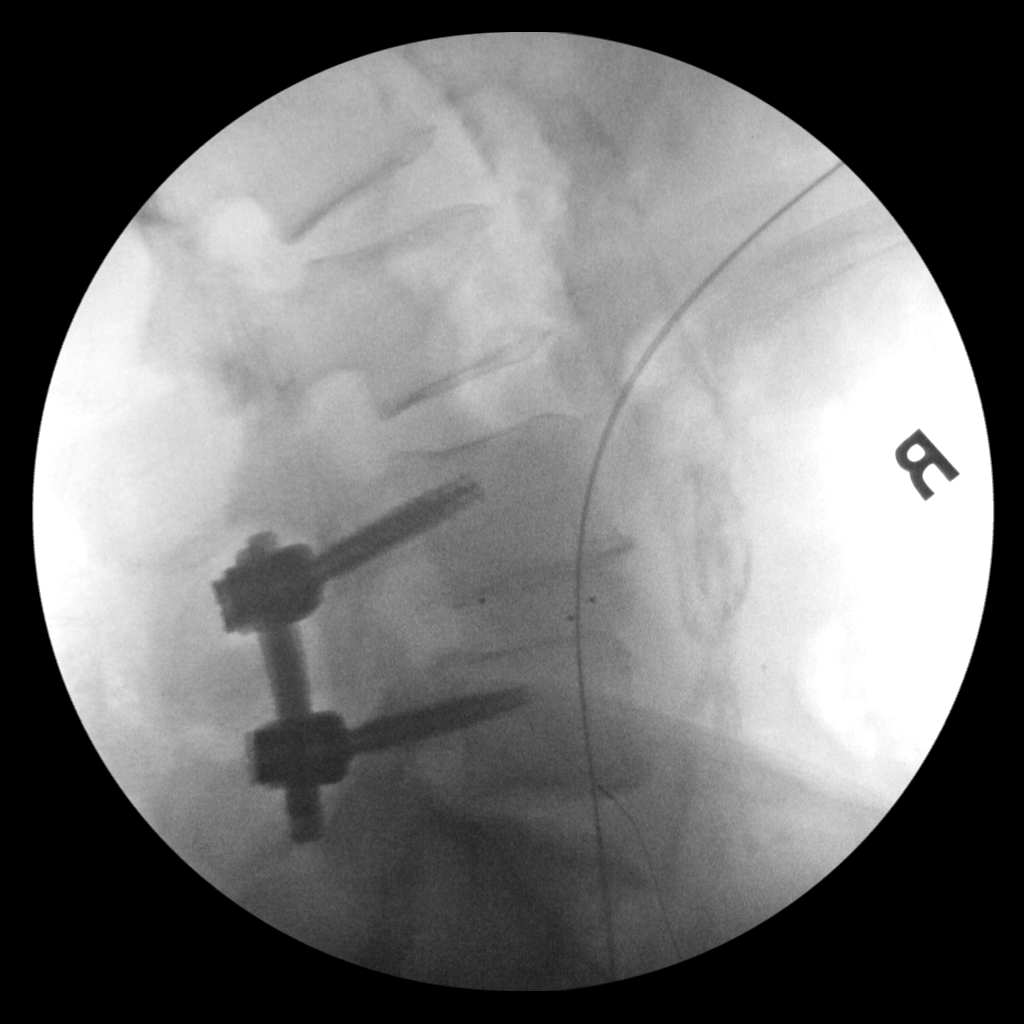
[im 2/2]
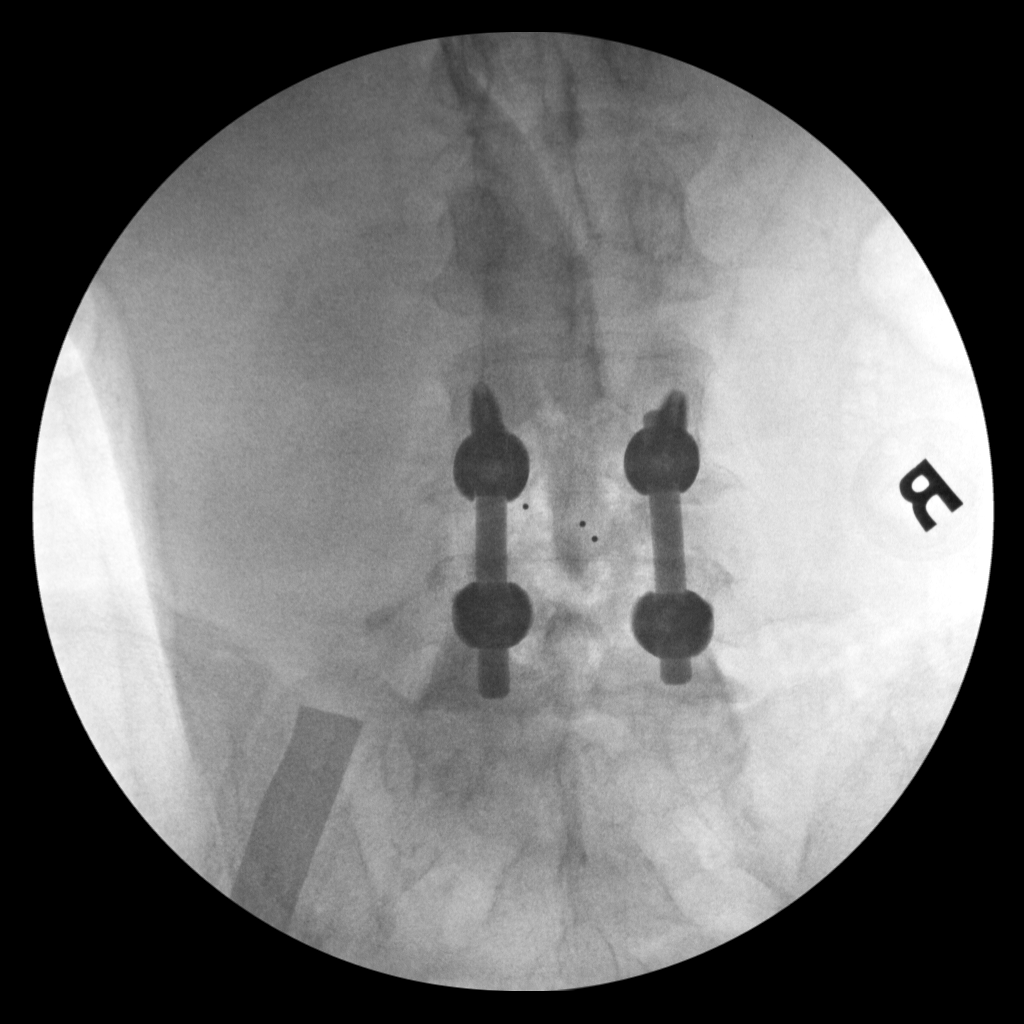

[2 of 2 positions shown; findings below may reference images not displayed]

FINDINGS: Two intraoperative spot fluoroscopy images are submitted. Signs of
L4-5 rod and pedicle screw fixation bilaterally with interbody cage
placement. Some anterolisthesis of L4 on L5 is visualized as seen on
previous radiograph, potentially slightly improved though on limited
assessment this is difficult to determine. No immediate complicating
features. Signs of vascular disease in the abdominal aorta. On the
AP projection there is a radiopaque structure in the RIGHT lower
quadrant projecting over the sacroiliac joint. This has an
appearance that suggests a sponge marker.
IMPRESSION: 1. Signs of L4-5 rod and pedicle screw fixation with interbody cage
placement. Some anterolisthesis of L4 on L5 as seen on previous
radiograph, may be slightly improved.
2. Radiopaque structure in the RIGHT lower quadrant projecting over
sacroiliac joint has an appearance that suggests a sponge marker.
3. A call is out to the referring provider to further discuss
findings in the above case.

ADDENDUM:
Sponge marker reportedly expected given the intraop nature of the
evaluation. These results were called by telephone at the time of
interpretation on 07/10/2020 at [DATE] to provider MANJUSHA PETROVICH ,
who verbally acknowledged these results.

*** End of Addendum ***
FINDINGS: Two intraoperative spot fluoroscopy images are submitted. Signs of
L4-5 rod and pedicle screw fixation bilaterally with interbody cage
placement. Some anterolisthesis of L4 on L5 is visualized as seen on
previous radiograph, potentially slightly improved though on limited
assessment this is difficult to determine. No immediate complicating
features. Signs of vascular disease in the abdominal aorta. On the
AP projection there is a radiopaque structure in the RIGHT lower
quadrant projecting over the sacroiliac joint. This has an
appearance that suggests a sponge marker.
IMPRESSION: 1. Signs of L4-5 rod and pedicle screw fixation with interbody cage
placement. Some anterolisthesis of L4 on L5 as seen on previous
radiograph, may be slightly improved.
2. Radiopaque structure in the RIGHT lower quadrant projecting over
sacroiliac joint has an appearance that suggests a sponge marker.
3. A call is out to the referring provider to further discuss
findings in the above case.

## 2020-12-10 ENCOUNTER — Ambulatory Visit: Payer: Medicare PPO | Admitting: Psychology

## 2020-12-17 ENCOUNTER — Ambulatory Visit (INDEPENDENT_AMBULATORY_CARE_PROVIDER_SITE_OTHER): Payer: Medicare PPO | Admitting: Psychology

## 2020-12-17 DIAGNOSIS — F330 Major depressive disorder, recurrent, mild: Secondary | ICD-10-CM | POA: Diagnosis not present

## 2020-12-31 ENCOUNTER — Ambulatory Visit (INDEPENDENT_AMBULATORY_CARE_PROVIDER_SITE_OTHER): Payer: Medicare PPO | Admitting: Psychology

## 2020-12-31 DIAGNOSIS — F330 Major depressive disorder, recurrent, mild: Secondary | ICD-10-CM | POA: Diagnosis not present

## 2021-01-14 ENCOUNTER — Ambulatory Visit: Payer: Medicare PPO | Admitting: Psychology

## 2021-01-28 ENCOUNTER — Ambulatory Visit (INDEPENDENT_AMBULATORY_CARE_PROVIDER_SITE_OTHER): Payer: Medicare PPO | Admitting: Psychology

## 2021-01-28 DIAGNOSIS — F411 Generalized anxiety disorder: Secondary | ICD-10-CM

## 2021-01-28 DIAGNOSIS — F331 Major depressive disorder, recurrent, moderate: Secondary | ICD-10-CM

## 2021-02-11 ENCOUNTER — Ambulatory Visit: Payer: Self-pay | Admitting: Psychology

## 2021-02-25 ENCOUNTER — Other Ambulatory Visit: Payer: Self-pay | Admitting: Cardiology

## 2021-02-25 ENCOUNTER — Ambulatory Visit (INDEPENDENT_AMBULATORY_CARE_PROVIDER_SITE_OTHER): Payer: Medicare PPO | Admitting: Psychology

## 2021-02-25 DIAGNOSIS — I10 Essential (primary) hypertension: Secondary | ICD-10-CM

## 2021-02-25 DIAGNOSIS — F330 Major depressive disorder, recurrent, mild: Secondary | ICD-10-CM

## 2021-03-10 ENCOUNTER — Telehealth: Payer: Self-pay | Admitting: Family

## 2021-03-10 NOTE — Telephone Encounter (Signed)
Patient says she needs a new asthma pump, pharmacy doesn't have one on file for her.

## 2021-03-11 ENCOUNTER — Ambulatory Visit (INDEPENDENT_AMBULATORY_CARE_PROVIDER_SITE_OTHER): Payer: Medicare PPO | Admitting: Psychology

## 2021-03-11 DIAGNOSIS — F330 Major depressive disorder, recurrent, mild: Secondary | ICD-10-CM | POA: Diagnosis not present

## 2021-03-11 NOTE — Telephone Encounter (Signed)
I have called [name] [name] back and informed her that if she is having asthma sx then she will need to be seen to have that adjusted. She is now on the schedule for 12:20 with Mickel Baas for tomorrow.

## 2021-03-12 ENCOUNTER — Other Ambulatory Visit: Payer: Self-pay

## 2021-03-12 ENCOUNTER — Ambulatory Visit: Payer: Medicare PPO | Admitting: Family

## 2021-03-12 ENCOUNTER — Ambulatory Visit: Payer: Medicare PPO | Admitting: Cardiology

## 2021-03-12 ENCOUNTER — Encounter: Payer: Self-pay | Admitting: Family

## 2021-03-12 VITALS — BP 122/72 | HR 82 | Temp 98.8°F | Ht 59.0 in | Wt 151.0 lb

## 2021-03-12 DIAGNOSIS — L089 Local infection of the skin and subcutaneous tissue, unspecified: Secondary | ICD-10-CM | POA: Diagnosis not present

## 2021-03-12 DIAGNOSIS — L309 Dermatitis, unspecified: Secondary | ICD-10-CM

## 2021-03-12 DIAGNOSIS — J4520 Mild intermittent asthma, uncomplicated: Secondary | ICD-10-CM | POA: Diagnosis not present

## 2021-03-12 MED ORDER — SULFAMETHOXAZOLE-TRIMETHOPRIM 800-160 MG PO TABS: 1.0000 | ORAL_TABLET | Freq: Two times a day (BID) | ORAL | 0 refills | Status: DC

## 2021-03-12 MED ORDER — FLUTICASONE-SALMETEROL 250-50 MCG/DOSE IN AEPB: 1.0000 | INHALATION_SPRAY | Freq: Two times a day (BID) | RESPIRATORY_TRACT | 3 refills | Status: DC

## 2021-03-12 MED ORDER — METOPROLOL SUCCINATE ER 25 MG PO TB24: 25.0000 mg | ORAL_TABLET | Freq: Every day | ORAL | 3 refills | Status: DC

## 2021-03-12 MED ORDER — TRIAMCINOLONE ACETONIDE 0.1 % EX CREA: 1.0000 "application " | TOPICAL_CREAM | Freq: Two times a day (BID) | CUTANEOUS | 0 refills | Status: DC

## 2021-03-12 NOTE — Progress Notes (Signed)
[name] [name] is a 70 y.o. female with the following history as recorded in [employer]:  Patient Active Problem List   Diagnosis Date Noted  . Claudication in peripheral vascular disease ([place]) 08/26/2020  . Neurogenic claudication ([place]) 07/10/2020  . Degenerative lumbar spinal stenosis 05/16/2020  . Sacroiliac pain 08/10/2019  . Anxiety and depression 05/11/2019  . Chest pain 05/09/2019  . Arthritis of sacroiliac joint of both sides 05/01/2019  . Polyarthralgia 12/28/2018  . Preventative health care 10/20/2018  . RLS (restless legs syndrome) 09/27/2018  . Insomnia disorder related to known organic factor 06/27/2018  . PLMD (periodic limb movement disorder) 06/27/2018  . Hot flashes due to menopause 04/25/2018  . Palpitations 04/25/2018  . Diaphoresis 04/25/2018  . Long term current use of antithrombotics/antiplatelets 04/19/2018  . Insomnia 04/19/2018  . Intractable episodic headache 02/28/2018  . Numbness and tingling of right arm 02/28/2018  . Toe pain, right 10/19/2017  . Rotator cuff arthropathy of left shoulder 06/02/2017  . Trigger thumb of left hand 06/02/2017  . Lipoma 05/06/2017  . Pain of left thumb 05/06/2017  . Chronic pain syndrome 12/15/2016  . Lumbar degenerative disc disease 12/15/2016  . Left leg pain 01/28/2016  . Left shoulder pain 04/09/2015  . Impaired glucose tolerance 07/30/2013  . Abnormal breath sounds 07/30/2013  . Left carotid stenosis 05/05/2012  . Anxiety 04/03/2011  . CVA (cerebral infarction) 03/15/2011  . HLD (hyperlipidemia) 11/03/2010  . Hx of adenomatous colonic polyps 02/09/2008  . Hypothyroidism 11/06/2007  . ALLERGIC RHINITIS 11/06/2007  . OSTEOPOROSIS 11/06/2007  . ALCOHOL ABUSE, HX OF 11/06/2007  . Essential hypertension 09/13/2007  . Asthma 09/13/2007  . PERIMENOPAUSAL STATUS 09/13/2007  . Chronic insomnia 09/13/2007    Current Outpatient Medications  Medication Sig Dispense Refill  . acetaminophen (TYLENOL) 650 MG CR tablet  Take 1,300 mg by mouth every 8 (eight) hours as needed for pain.    Marland Kitchen amLODipine (NORVASC) 5 MG tablet TAKE 1 TABLET BY MOUTH DAILY AFTER SUPPER. 90 tablet 0  . aspirin EC 81 MG tablet Take 1 tablet (81 mg total) by mouth daily. 90 tablet 3  . atorvastatin (LIPITOR) 40 MG tablet Take 1 tablet (40 mg total) by mouth daily. 90 tablet 3  . benazepril-hydrochlorthiazide (LOTENSIN HCT) 10-12.5 MG tablet Take 1 tablet by mouth daily. 90 tablet 3  . Calcium Carbonate-Vit D-Min (CALCIUM 1200 PO) Take 1,200 mg by mouth daily.    . Cholecalciferol (VITAMIN D) 50 MCG (2000 UT) tablet Take 2,000 Units by mouth daily.    . citalopram (CELEXA) 20 MG tablet Take 1 tablet (20 mg total) by mouth daily. 90 tablet 3  . clopidogrel (PLAVIX) 75 MG tablet Take 1 tablet (75 mg total) by mouth daily. 90 tablet 3  . diphenhydrAMINE-APAP, sleep, (TYLENOL PM EXTRA STRENGTH PO) Take 2 tablets by mouth at bedtime.     Marland Kitchen ezetimibe (ZETIA) 10 MG tablet TAKE 1 TABLET (10 MG TOTAL) BY MOUTH DAILY AFTER SUPPER. 90 tablet 3  . Fluticasone-Salmeterol (ADVAIR DISKUS) 250-50 MCG/DOSE AEPB Inhale 1 puff into the lungs in the morning and at bedtime. 60 each 3  . levothyroxine (SYNTHROID) 75 MCG tablet Take 1 tablet (75 mcg total) by mouth daily before breakfast. 90 tablet 2  . montelukast (SINGULAIR) 10 MG tablet Take 1 tablet (10 mg total) by mouth daily. 90 tablet 3  . Multiple Vitamin (MULTIVITAMIN WITH MINERALS) TABS tablet Take 1 tablet by mouth daily.    . pantoprazole (PROTONIX) 40 MG tablet Take  1 tablet (40 mg total) by mouth 2 (two) times daily. 180 tablet 3  . rOPINIRole (REQUIP) 0.5 MG tablet Take 1 tablet (0.5 mg total) by mouth at bedtime. 90 tablet 3  . spironolactone (ALDACTONE) 25 MG tablet TAKE 1 TABLET BY MOUTH EVERY DAY IN THE MORNING 90 tablet 0  . sulfamethoxazole-trimethoprim (BACTRIM DS) 800-160 MG tablet Take 1 tablet by mouth 2 (two) times daily. 10 tablet 0  . triamcinolone (KENALOG) 0.1 % Apply 1 application  topically 2 (two) times daily. 453.6 g 0  . zolpidem (AMBIEN) 10 MG tablet Take 1 tablet (10 mg total) by mouth at bedtime as needed. for sleep 90 tablet 1  . metoprolol succinate (TOPROL-XL) 25 MG 24 hr tablet Take 1 tablet (25 mg total) by mouth daily. 90 tablet 3   No current facility-administered medications for this visit.    Allergies: Fosamax [alendronate sodium] and Penicillins  Past Medical History:  Diagnosis Date  . Acid reflux   . ALCOHOL ABUSE, HX OF 11/06/2007  . ALLERGIC RHINITIS 11/06/2007  . Anxiety 04/03/2011  . ASTHMA 09/13/2007  . Asthma   . Chronic pain syndrome 12/15/2016  . COLONIC POLYPS, HX OF 02/09/2008    ADENOMATOUS POLYP  . Encounter for well adult exam without abnormal findings 04/03/2011  . HYPERLIPIDEMIA 11/03/2010  . HYPERTENSION 09/13/2007  . HYPOTHYROIDISM 11/06/2007  . Impaired glucose tolerance 07/30/2013  . INSOMNIA, HX OF 09/13/2007  . Lumbar degenerative disc disease 12/15/2016  . OSTEOPOROSIS 11/06/2007  . PERIMENOPAUSAL STATUS 09/13/2007  . RLS (restless legs syndrome)   . Stroke ([place])   . TIA (transient ischemic attack) 11/04/2011    Past Surgical History:  Procedure Laterality Date  . ABDOMINAL AORTOGRAM W/LOWER EXTREMITY N/A 08/27/2020   Procedure: ABDOMINAL AORTOGRAM W/LOWER EXTREMITY;  Surgeon: Nigel Mormon, MD;  Location: [facility];  Service: Cardiovascular;  Laterality: N/A;  . BREAST EXCISIONAL BIOPSY Left   . BREAST SURGERY  2008 and 2012   x 2 - benign, left side  . carotid artery surgery Left   . CESAREAN SECTION     x 3  . COLONOSCOPY  multiple   2010  . PERIPHERAL VASCULAR BALLOON ANGIOPLASTY  08/27/2020   Procedure: PERIPHERAL VASCULAR BALLOON ANGIOPLASTY;  Surgeon: Nigel Mormon, MD;  Location: [facility];  Service: Cardiovascular;;  Right SFA scoring balloon  . TRANSFORAMINAL LUMBAR INTERBODY FUSION (TLIF) WITH PEDICLE SCREW FIXATION 1 LEVEL Left 07/10/2020   Procedure: LEFT-SIDED LUMBAR FOUR-FIVE  TRANSFORAMINAL LUMBAR INTERBODY FUSION WITH INSTRUMENTATION AND ALLOGRAFT;  Surgeon: Phylliss Bob, MD;  Location: [facility];  Service: Orthopedics;  Laterality: Left;    Family History  Problem Relation Age of Onset  . Hyperlipidemia Other   . Hypertension Other   . Coronary artery disease Other   . Breast cancer Cousin   . Hypertension Mother   . Heart disease Mother   . Heart failure Mother   . Dementia Maternal Grandmother   . Colon cancer Neg Hx     Social History   Tobacco Use  . Smoking status: Former Smoker    Packs/day: 0.25    Types: Cigarettes    Quit date: 05/31/1979    Years since quitting: 41.8  . Smokeless tobacco: Current User    Types: Snuff  . Tobacco comment: social smoker  Substance Use Topics  . Alcohol use: No    Comment: former alcholic    Subjective:   Requesting refill on BREO- would be open to other  option due to cost concerns; has to use when "seasons change." No shortness of breath at this time; Continue problems with eczema on palms of hands- limited relief with OTC medications; Concerned for boil on labia- prefers not to have examined; notes happens often and requesting antibiotic.   Objective:  Vitals:   03/12/21 1227  BP: 122/72  Pulse: 82  Temp: 98.8 F (37.1 C)  TempSrc: Oral  SpO2: 97%  Weight: 151 lb (68.5 kg)  Height: 4\' 11"  (1.499 m)    General: Well developed, well nourished, in no acute distress  Skin : Warm and dry. Dry skin patches noted on palmar surfaces Head: Normocephalic and atraumatic  Lungs: Respirations unlabored; clear to auscultation bilaterally without wheeze, rales, rhonchi  Neurologic: Alert and oriented; speech intact; face symmetrical; moves all extremities well; CNII-XII intact without focal deficit   Assessment:  1. Mild intermittent extrinsic asthma without complication   2. Eczema, unspecified type   3. Skin infection     Plan:  1. Try changing from [facility] to Advair due to cost issues; use Advair bid and  rinse/ spit after using medication; 2. Rx for Triamcinolone- use bid to affected area as needed; call back if she wants to see dermatologist; 3. Rx for Bactrim DS bid x 5 days; apply warm compresses to area;  This visit occurred during the SARS-CoV-2 public health emergency.  Safety protocols were in place, including screening questions prior to the visit, additional usage of staff PPE, and extensive cleaning of exam room while observing appropriate contact time as indicated for disinfecting solutions.     No follow-ups on file.  No orders of the defined types were placed in this encounter.   Requested Prescriptions   Signed Prescriptions Disp Refills  . triamcinolone (KENALOG) 0.1 % 453.6 g 0    Sig: Apply 1 application topically 2 (two) times daily.  . Fluticasone-Salmeterol (ADVAIR DISKUS) 250-50 MCG/DOSE AEPB 60 each 3    Sig: Inhale 1 puff into the lungs in the morning and at bedtime.  . sulfamethoxazole-trimethoprim (BACTRIM DS) 800-160 MG tablet 10 tablet 0    Sig: Take 1 tablet by mouth 2 (two) times daily.  . metoprolol succinate (TOPROL-XL) 25 MG 24 hr tablet 90 tablet 3    Sig: Take 1 tablet (25 mg total) by mouth daily.

## 2021-03-14 ENCOUNTER — Other Ambulatory Visit: Payer: Self-pay | Admitting: Family

## 2021-03-17 NOTE — Telephone Encounter (Signed)
Patient is requesting a refill of the following medications: Requested Prescriptions   Pending Prescriptions Disp Refills   zolpidem (AMBIEN) 10 MG tablet [Pharmacy Med Name: ZOLPIDEM TARTRATE 10 MG TABLET] 90 tablet     Sig: Take 1 tablet (10 mg total) by mouth at bedtime as needed. for sleep    Date of patient request: 03/14/21 Last office visit: 03/12/21 Date of last refill: 09/13/20 Last refill amount: 90 + 1 Follow up time period per chart: none yet

## 2021-03-25 ENCOUNTER — Ambulatory Visit (INDEPENDENT_AMBULATORY_CARE_PROVIDER_SITE_OTHER): Payer: Medicare PPO | Admitting: Psychology

## 2021-03-25 DIAGNOSIS — F330 Major depressive disorder, recurrent, mild: Secondary | ICD-10-CM

## 2021-04-08 ENCOUNTER — Ambulatory Visit (INDEPENDENT_AMBULATORY_CARE_PROVIDER_SITE_OTHER): Payer: Medicare PPO | Admitting: Psychology

## 2021-04-08 DIAGNOSIS — F330 Major depressive disorder, recurrent, mild: Secondary | ICD-10-CM

## 2021-04-17 ENCOUNTER — Ambulatory Visit: Payer: Medicare PPO | Admitting: Cardiology

## 2021-04-22 ENCOUNTER — Ambulatory Visit (INDEPENDENT_AMBULATORY_CARE_PROVIDER_SITE_OTHER): Payer: Medicare PPO | Admitting: Psychology

## 2021-04-22 DIAGNOSIS — F330 Major depressive disorder, recurrent, mild: Secondary | ICD-10-CM | POA: Diagnosis not present

## 2021-05-05 ENCOUNTER — Other Ambulatory Visit: Payer: Self-pay | Admitting: Cardiology

## 2021-05-05 ENCOUNTER — Other Ambulatory Visit: Payer: Self-pay | Admitting: Family

## 2021-05-05 DIAGNOSIS — I10 Essential (primary) hypertension: Secondary | ICD-10-CM

## 2021-05-06 ENCOUNTER — Ambulatory Visit (INDEPENDENT_AMBULATORY_CARE_PROVIDER_SITE_OTHER): Payer: Medicare PPO | Admitting: Psychology

## 2021-05-06 DIAGNOSIS — F330 Major depressive disorder, recurrent, mild: Secondary | ICD-10-CM

## 2021-05-16 ENCOUNTER — Ambulatory Visit: Payer: Medicare PPO

## 2021-05-16 ENCOUNTER — Other Ambulatory Visit: Payer: Self-pay

## 2021-05-16 DIAGNOSIS — I6523 Occlusion and stenosis of bilateral carotid arteries: Secondary | ICD-10-CM

## 2021-05-16 DIAGNOSIS — Z9889 Other specified postprocedural states: Secondary | ICD-10-CM

## 2021-05-20 ENCOUNTER — Ambulatory Visit (INDEPENDENT_AMBULATORY_CARE_PROVIDER_SITE_OTHER): Payer: Medicare PPO | Admitting: Psychology

## 2021-05-20 DIAGNOSIS — F330 Major depressive disorder, recurrent, mild: Secondary | ICD-10-CM

## 2021-05-25 NOTE — Progress Notes (Signed)
Carotid artery duplex 05/16/2021: Stenosis in the right internal carotid artery (50-69%). Stenosis in the right external carotid artery (<50%). Right ICA could not be visualized due to patient discomfort. Antegrade right vertebral artery flow. H/O left carotid endarterectomy, patent by study  dated 04/22/2020, there is also mild progression of disease severity in the right ICA from <50%. Follow up in six months is appropriate if clinically indicated.

## 2021-06-03 ENCOUNTER — Ambulatory Visit (INDEPENDENT_AMBULATORY_CARE_PROVIDER_SITE_OTHER): Payer: Medicare PPO | Admitting: Psychology

## 2021-06-03 DIAGNOSIS — F330 Major depressive disorder, recurrent, mild: Secondary | ICD-10-CM | POA: Diagnosis not present

## 2021-06-14 ENCOUNTER — Other Ambulatory Visit: Payer: Self-pay | Admitting: Family

## 2021-06-17 ENCOUNTER — Encounter: Payer: Self-pay | Admitting: Cardiology

## 2021-06-17 ENCOUNTER — Other Ambulatory Visit: Payer: Self-pay

## 2021-06-17 ENCOUNTER — Ambulatory Visit: Payer: Medicare PPO | Admitting: Cardiology

## 2021-06-17 VITALS — BP 118/58 | HR 77 | Ht 59.0 in | Wt 155.0 lb

## 2021-06-17 DIAGNOSIS — I6523 Occlusion and stenosis of bilateral carotid arteries: Secondary | ICD-10-CM

## 2021-06-17 DIAGNOSIS — I10 Essential (primary) hypertension: Secondary | ICD-10-CM

## 2021-06-17 DIAGNOSIS — E7800 Pure hypercholesterolemia, unspecified: Secondary | ICD-10-CM

## 2021-06-17 DIAGNOSIS — I739 Peripheral vascular disease, unspecified: Secondary | ICD-10-CM

## 2021-06-17 MED ORDER — CLOPIDOGREL BISULFATE 75 MG PO TABS: 75.0000 mg | ORAL_TABLET | Freq: Every day | ORAL | 3 refills | Status: DC

## 2021-06-17 NOTE — Progress Notes (Signed)
Primary Physician/Referring:  Marrian Salvage, FNP  Patient ID: [name] [name], female    DOB: [birth year], 70 y.o.   MRN: [id number]  Chief Complaint  Patient presents with   PAD   Follow-up   Leg Pain    HPI:    [name] [name]  is a 70 y.o. female  AA female  with history of left carotid endarterectomy due to symptomatic carotid stenosis in 2012 with TIA, asymptomatic right carotid stenosis, bronchial asthma, hypertension, hyperlipidemia, hyperglycemia presents for 6 month OV.    States that symptoms of claudication has recurred over the past 5 to 6 months and states that is coming in the way of physical activity and regular exercise and started to gain weight.  Denies chest pain or dyspnea.  No symptoms to suggest TIA.  Past Medical History:  Diagnosis Date   Acid reflux    ALCOHOL ABUSE, HX OF 11/06/2007   ALLERGIC RHINITIS 11/06/2007   Anxiety 04/03/2011   ASTHMA 09/13/2007   Asthma    Chronic pain syndrome 12/15/2016   COLONIC POLYPS, HX OF 02/09/2008    ADENOMATOUS POLYP   Encounter for well adult exam without abnormal findings 04/03/2011   HYPERLIPIDEMIA 11/03/2010   HYPERTENSION 09/13/2007   HYPOTHYROIDISM 11/06/2007   Impaired glucose tolerance 07/30/2013   INSOMNIA, HX OF 09/13/2007   Lumbar degenerative disc disease 12/15/2016   OSTEOPOROSIS 11/06/2007   PERIMENOPAUSAL STATUS 09/13/2007   RLS (restless legs syndrome)    Stroke [facility])    TIA (transient ischemic attack) 11/04/2011   Past Surgical History:  Procedure Laterality Date   ABDOMINAL AORTOGRAM W/LOWER EXTREMITY N/A 08/27/2020   Procedure: ABDOMINAL AORTOGRAM W/LOWER EXTREMITY;  Surgeon: Nigel Mormon, MD;  Location: [place] [facility];  Service: Cardiovascular;  Laterality: N/A;   BREAST EXCISIONAL BIOPSY Left    BREAST SURGERY  2008 and 2012   x 2 - benign, left side   carotid artery surgery Left    CESAREAN SECTION     x 3   COLONOSCOPY  multiple   2010   PERIPHERAL VASCULAR BALLOON  ANGIOPLASTY  08/27/2020   Procedure: PERIPHERAL VASCULAR BALLOON ANGIOPLASTY;  Surgeon: Nigel Mormon, MD;  Location: [place] [facility];  Service: Cardiovascular;;  Right SFA scoring balloon   TRANSFORAMINAL LUMBAR INTERBODY FUSION (TLIF) WITH PEDICLE SCREW FIXATION 1 LEVEL Left 07/10/2020   Procedure: LEFT-SIDED LUMBAR FOUR-FIVE TRANSFORAMINAL LUMBAR INTERBODY FUSION WITH INSTRUMENTATION AND ALLOGRAFT;  Surgeon: Phylliss Bob, MD;  Location: [place];  Service: Orthopedics;  Laterality: Left;   Family History  Problem Relation Age of Onset   Hyperlipidemia Other    Hypertension Other    Coronary artery disease Other    Breast cancer Cousin    Hypertension Mother    Heart disease Mother    Heart failure Mother    Dementia Maternal Grandmother    Colon cancer Neg Hx     Social History   Tobacco Use   Smoking status: Former    Packs/day: 0.25    Pack years: 0.00    Types: Cigarettes    Quit date: 05/31/1979    Years since quitting: 42.0   Smokeless tobacco: Current    Types: Snuff   Tobacco comments:    social smoker  Substance Use Topics   Alcohol use: No    Comment: former alcholic   Marital Status: Divorced  ROS  Review of Systems  Cardiovascular:  Positive for claudication. Negative for dyspnea on exertion and leg swelling.  Respiratory:  Positive for wheezing (occasional).   Musculoskeletal:  Positive for arthritis and back pain (immproved since surgery).  Gastrointestinal:  Negative for melena.  All other systems reviewed and are negative. Objective  Blood pressure (!) 118/58, pulse 77, height _0  (1.499 m), weight 155 lb (70.3 kg), SpO2 95 %.  Vitals with BMI 06/17/2021 03/12/2021 09/13/2020  Height _1  _2  _3   Weight 155 lbs 151 lbs 150 lbs  BMI 31.29 31.54 00.86  Systolic 761 950 932  Diastolic 58 72 68  Pulse 77 82 66     Physical Exam Constitutional:      General: She is not in acute distress.    Comments: Short stature  HENT:     Head:  Atraumatic.  Cardiovascular:     Rate and Rhythm: Normal rate and regular rhythm.     Pulses: Intact distal pulses.          Carotid pulses are  on the right side with bruit.      Popliteal pulses are 0 on the right side and 0 on the left side.       Dorsalis pedis pulses are 1+ on the right side and 1+ on the left side.       Posterior tibial pulses are 0 on the right side and 0 on the left side.     Heart sounds: S1 normal and S2 normal. No murmur heard.   No gallop.     Comments: Left carotid endarterectomy scar noted Pulmonary:     Effort: Pulmonary effort is normal.     Breath sounds: Normal breath sounds.  Abdominal:     General: Bowel sounds are normal.     Palpations: Abdomen is soft.   Laboratory examination:   Recent Labs    07/04/20 1155 07/10/20 0700 08/08/20 1408  NA 138 138 139  K 4.0 3.5 4.1  CL 103 105 101  CO2 _4 GLUCOSE 112* 123* 104*  BUN _5 CREATININE 1.09* 1.07* 0.86  CALCIUM 9.6 9.6 9.9  GFRNONAA 52* 53* 70  GFRAA >60 >60 80   CrCl cannot be calculated (Patient's most recent lab result is older than the maximum 21 days allowed.).  CMP Latest Ref Rng & Units 08/08/2020 07/10/2020 07/04/2020  Glucose 65 - 99 mg/dL 104(H) 123(H) 112(H)  BUN 8 - 27 mg/dL _6 Creatinine 0.57 - 1.00 mg/dL 0.86 1.07(H) 1.09(H)  Sodium 134 - 144 mmol/L 139 138 138  Potassium 3.5 - 5.2 mmol/L 4.1 3.5 4.0  Chloride 96 - 106 mmol/L 101 105 103  CO2 20 - 29 mmol/L _7 Calcium 8.7 - 10.3 mg/dL 9.9 9.6 9.6  Total Protein 6.5 - 8.1 g/dL - 6.6 -  Total Bilirubin 0.3 - 1.2 mg/dL - 0.4 -  Alkaline Phos 38 - 126 U/L - 87 -  AST 15 - 41 U/L - 21 -  ALT 0 - 44 U/L - 19 -   CBC Latest Ref Rng & Units 08/08/2020 07/10/2020 07/04/2020  WBC 3.4 - 10.8 x10E3/uL 7.6 10.1 7.7  Hemoglobin 11.1 - 15.9 g/dL 11.1 11.8(L) 12.2  Hematocrit 34.0 - 46.6 % 32.4(L) 37.8 38.6  Platelets 150 - 450 x10E3/uL 328 245 244    Lipid Panel Recent Labs    08/08/20 1408  CHOL  172  TRIG 60  LDLCALC 80  HDL 80    HEMOGLOBIN A1C Lab Results  Component Value Date  HGBA1C 6.4 10/16/2019   MPG 128 (H) 03/04/2011   TSH No results for input(s): TSH in the last 8760 hours.  Allergies   Allergies  Allergen Reactions   Fosamax [Alendronate Sodium] Nausea And Vomiting   Penicillins Hives    Did it involve swelling of the face/tongue/throat, SOB, or low BP? No Did it involve sudden or severe rash/hives, skin peeling, or any reaction on the inside of your mouth or nose? Yes Did you need to seek medical attention at a hospital or doctor's office? Yes When did it last happen? Over 10 years ago If all above answers are "NO", may proceed with cephalosporin use.    Medications Prior to Visit:   Outpatient Medications Prior to Visit  Medication Sig Dispense Refill   acetaminophen (TYLENOL) 650 MG CR tablet Take 1,300 mg by mouth every 8 (eight) hours as needed for pain.     amLODipine (NORVASC) 5 MG tablet TAKE 1 TABLET BY MOUTH DAILY AFTER SUPPER. 90 tablet 0   aspirin EC 81 MG tablet Take 1 tablet (81 mg total) by mouth daily. 90 tablet 3   atorvastatin (LIPITOR) 40 MG tablet Take 1 tablet (40 mg total) by mouth daily. 90 tablet 3   benazepril-hydrochlorthiazide (LOTENSIN HCT) 10-12.5 MG tablet Take 1 tablet by mouth daily. 90 tablet 3   Calcium Carbonate-Vit D-Min (CALCIUM 1200 PO) Take 1,200 mg by mouth daily.     Cholecalciferol (VITAMIN D) 50 MCG (2000 UT) tablet Take 2,000 Units by mouth daily.     citalopram (CELEXA) 20 MG tablet Take 1 tablet (20 mg total) by mouth daily. 90 tablet 3   diphenhydrAMINE-APAP, sleep, (TYLENOL PM EXTRA STRENGTH PO) Take 2 tablets by mouth at bedtime.      Fluticasone-Salmeterol (ADVAIR DISKUS) 250-50 MCG/DOSE AEPB Inhale 1 puff into the lungs in the morning and at bedtime. 60 each 3   levothyroxine (SYNTHROID) 75 MCG tablet Take 1 tablet (75 mcg total) by mouth daily before breakfast. 90 tablet 2   metoprolol succinate  (TOPROL-XL) 25 MG 24 hr tablet Take 1 tablet (25 mg total) by mouth daily. 90 tablet 3   Multiple Vitamin (MULTIVITAMIN WITH MINERALS) TABS tablet Take 1 tablet by mouth daily.     pantoprazole (PROTONIX) 40 MG tablet Take 1 tablet (40 mg total) by mouth 2 (two) times daily. 180 tablet 3   rOPINIRole (REQUIP) 0.5 MG tablet Take 1 tablet (0.5 mg total) by mouth at bedtime. 90 tablet 3   spironolactone (ALDACTONE) 25 MG tablet TAKE 1 TABLET BY MOUTH EVERY DAY IN THE MORNING 90 tablet 0   sulfamethoxazole-trimethoprim (BACTRIM DS) 800-160 MG tablet Take 1 tablet by mouth 2 (two) times daily. 10 tablet 0   triamcinolone cream (KENALOG) 0.1 % APPLY TO AFFECTED AREA TWICE A DAY 454 g 0   zolpidem (AMBIEN) 10 MG tablet TAKE 1 TABLET (10 MG TOTAL) BY MOUTH AT BEDTIME AS NEEDED. FOR SLEEP 90 tablet 0   clopidogrel (PLAVIX) 75 MG tablet Take 1 tablet (75 mg total) by mouth daily. 90 tablet 3   ezetimibe (ZETIA) 10 MG tablet TAKE 1 TABLET (10 MG TOTAL) BY MOUTH DAILY AFTER SUPPER. 90 tablet 3   montelukast (SINGULAIR) 10 MG tablet Take 1 tablet (10 mg total) by mouth daily. 90 tablet 3   No facility-administered medications prior to visit.   Final Medications at End of Visit    Current Outpatient Medications  Medication Instructions   acetaminophen (TYLENOL) 1,300 mg, Oral, Every 8 hours  PRN   amLODipine (NORVASC) 5 MG tablet TAKE 1 TABLET BY MOUTH DAILY AFTER SUPPER.   aspirin EC 81 mg, Oral, Daily   atorvastatin (LIPITOR) 40 mg, Oral, Daily   benazepril-hydrochlorthiazide (LOTENSIN HCT) 10-12.5 MG tablet 1 tablet, Oral, Daily   Calcium Carbonate-Vit D-Min (CALCIUM 1200 PO) 1,200 mg, Oral, Daily   citalopram (CELEXA) 20 mg, Oral, Daily   clopidogrel (PLAVIX) 75 mg, Oral, Daily   diphenhydrAMINE-APAP, sleep, (TYLENOL PM EXTRA STRENGTH PO) 2 tablets, Oral, Daily at bedtime   ezetimibe (ZETIA) 10 mg, Oral, Daily after supper   Fluticasone-Salmeterol (ADVAIR DISKUS) 250-50 MCG/DOSE AEPB 1 puff,  Inhalation, 2 times daily   levothyroxine (SYNTHROID) 75 mcg, Oral, Daily before breakfast   metoprolol succinate (TOPROL-XL) 25 mg, Oral, Daily   Multiple Vitamin (MULTIVITAMIN WITH MINERALS) TABS tablet 1 tablet, Oral, Daily   pantoprazole (PROTONIX) 40 mg, Oral, 2 times daily   rOPINIRole (REQUIP) 0.5 mg, Oral, Daily at bedtime   spironolactone (ALDACTONE) 25 MG tablet TAKE 1 TABLET BY MOUTH EVERY DAY IN THE MORNING   sulfamethoxazole-trimethoprim (BACTRIM DS) 800-160 MG tablet 1 tablet, Oral, 2 times daily   triamcinolone cream (KENALOG) 0.1 % APPLY TO AFFECTED AREA TWICE A DAY   Vitamin D 2,000 Units, Oral, Daily   zolpidem (AMBIEN) 10 mg, Oral, At bedtime PRN, for sleep    Radiology:   No results found.  Cardiac Studies:   Echocardiogram 04/15/8881:  Normal systolic function, EF 80%.  Mild LVH.  Grade 2 diastolic dysfunction.  Moderately dilated left atrium. Mild thickening of mitral valve with mild calcification.  Mild aortic valve weakening and calcification.  Event Monitor for 14 days Start date 06/02/2019:  Predominant rhythm is normal sinus rhythm.:  Symptomatic transmissions of flutter and chest pain revealed normal sinus rhythm.  Automatic detected event revealed one episode of 12 beat (8 Sec) wide complex tachycardia suggestive of NSVT at 116 bpm at 2:30 PM on 06/10/2019.  Lexiscan Myoview Stress Test 06/26/2019: Resting EKG demonstrates sinus rhythm with short PR interval, borderline criteria for LVH.   stress EKG is nondiagnostic for ischemia as his of alcoholic stress test. Symptoms during test were SOB and Dizziness.  Myocardial pefusion imaging is normal. Left ventricular ejection fraction is  67% with normal wall motion. Low risk study.  Lower Extremity Arterial Duplex 04/22/2020:  Right mid SFA has monophasic waveform suggests diffuse disease and may  indicate hemodynamically significant stenosis.  No hemodynamically significant stenosis is identified in the left  lower  extremity arterial system.  This exam reveals mildly decreased perfusion of the right lower extremity,  noted at the dorsalis pedis and post tibial artery level (ABI 0.93) and  mildly decreased perfusion of the left lower extremity, noted at the  dorsalis pedis and post tibial artery level (ABI 0.93).   PERIPHERAL VASC BALLOON ANGIOPLASTY/ABD AORTOGRAM W/LOWER EXTREMITY 08/27/2020:  Rt SFA prox 70% diffuse, mid 95% focal stenosis Left SFA mid 20% stenosis Bilateral internal iliac ostial 50% stenosis, followed by small aneurysmal areas Three vessel runoff below the knee b/l Successful PTCA right SFA using chocolate balloon 5.0X120 mm 0% residual stenosis  ABI 10/02/2020:  This exam reveals mildly decreased perfusion of the bilateral  lower  extremity, noted at the anterior tibial artery level (ABI 0.95).  Compared  to the study done on 04/22/2020, bilateral ABI 0.95.  There is biphasic  waveforms noted at the level of the ankle bilaterally.  Carotid artery duplex 05/16/2021: Stenosis in the right internal carotid artery (50-69%).  Stenosis in the right external carotid artery (<50%). Right ICA could not be visualized due to patient discomfort. Antegrade right vertebral artery flow. H/O left carotid endarterectomy, patent by study  dated 04/22/2020, there is also mild progression of disease severity in the right ICA from <50%. Follow up in six months is appropriate if clinically indicated.  EKG:   EKG 06/17/2021: Normal sinus rhythm at rate of 71 bpm, left atrial enlargement, normal axis.  No evidence of ischemia.  No significant change from 02/05/2020.  Assessment     ICD-10-CM   1. Essential hypertension  I10 EKG 12-Lead    2. Asymptomatic bilateral carotid artery stenosis  I65.23     3. Claudication in peripheral vascular disease (HCC)  I73.9 EKG 12-Lead    PCV LOWER ARTERIAL (BILATERAL)    clopidogrel (PLAVIX) 75 MG tablet    CMP14+EGFR    CBC    4. Hypercholesteremia   E78.00 Lipid Panel With LDL/HDL Ratio      Meds ordered this encounter  Medications   clopidogrel (PLAVIX) 75 MG tablet    Sig: Take 1 tablet (75 mg total) by mouth daily.    Dispense:  90 tablet    Refill:  3     Medications Discontinued During This Encounter  Medication Reason   montelukast (SINGULAIR) 10 MG tablet Error   clopidogrel (PLAVIX) 75 MG tablet    Recommendations:   [name] [name]  is a  70 y.o. AA female  with history of left carotid endarterectomy due to symptomatic carotid stenosis in 2012 with TIA, asymptomatic right carotid stenosis, bronchial asthma, hypertension, hyperlipidemia, hyperglycemia presents for 6 month OV.    States that symptoms of claudication has recurred over the past 5 to 6 months and states that is coming in the way of physical activity and regular exercise and started to gain weight.  No change in physical exam with regard to vascular examination.  I will perform lower extremity arterial duplex and see if her symptoms are related to neurogenic claudication versus vascular claudication as she has had back surgery in July 2021 for spinal stenosis.  Her blood pressure is well controlled, previously she had difficulty in control until spironolactone was started with resolution of hypokalemia as well.  Continue the same for now.   Reviewed her lipids, LDL is >70, we will recheck her lipids.  I would also like to check CBC and a CMP as she is on chronic Plavix, she has also developed rash on her skin, probably related to one of the medication side effects.  Advised her to discontinue each one of the medication and restart if no change in symptoms 3 weeks at a time.  Carotid artery duplex reviewed, there is mild progression of disease on the right, left carotid endarterectomy is widely patent.  We will continue 6 monthly surveillance.  I would like to see her back in 6 weeks for follow-up.  Advised her to bring all her home medications during visits or at  least a complete list.   I will discuss with her on her next office visit regarding chewing tobacco and the effects of this on PAD.   Adrian Prows, MD, [facility] 06/17/2021, 6:06 PM Office: [phone] Fax: [phone] Pager: [phone]

## 2021-06-18 LAB — CMP14+EGFR
ALT: 14 IU/L (ref 0–32)
AST: 19 IU/L (ref 0–40)
Albumin/Globulin Ratio: 1.8 (ref 1.2–2.2)
Albumin: 4.2 g/dL (ref 3.8–4.8)
Alkaline Phosphatase: 131 IU/L — ABNORMAL HIGH (ref 44–121)
BUN/Creatinine Ratio: 17 (ref 12–28)
BUN: 22 mg/dL (ref 8–27)
Bilirubin Total: 0.4 mg/dL (ref 0.0–1.2)
CO2: 21 mmol/L (ref 20–29)
Calcium: 10.1 mg/dL (ref 8.7–10.3)
Chloride: 104 mmol/L (ref 96–106)
Creatinine, Ser: 1.28 mg/dL — ABNORMAL HIGH (ref 0.57–1.00)
Globulin, Total: 2.3 g/dL (ref 1.5–4.5)
Glucose: 103 mg/dL — ABNORMAL HIGH (ref 65–99)
Potassium: 4.3 mmol/L (ref 3.5–5.2)
Sodium: 141 mmol/L (ref 134–144)
Total Protein: 6.5 g/dL (ref 6.0–8.5)
eGFR: 45 mL/min/{1.73_m2} — ABNORMAL LOW (ref >59)

## 2021-06-18 LAB — CBC
Hematocrit: 36.2 % (ref 34.0–46.6)
Hemoglobin: 12.3 g/dL (ref 11.1–15.9)
MCH: 31.1 pg (ref 26.6–33.0)
MCHC: 34 g/dL (ref 31.5–35.7)
MCV: 92 fL (ref 79–97)
Platelets: 267 10*3/uL (ref 150–450)
RBC: 3.95 x10E6/uL (ref 3.77–5.28)
RDW: 13.1 % (ref 11.7–15.4)
WBC: 7.4 10*3/uL (ref 3.4–10.8)

## 2021-06-18 LAB — LIPID PANEL WITH LDL/HDL RATIO
Cholesterol, Total: 152 mg/dL (ref 100–199)
HDL: 66 mg/dL (ref >39)
LDL Chol Calc (NIH): 71 mg/dL (ref 0–99)
LDL/HDL Ratio: 1.1 ratio (ref 0.0–3.2)
Triglycerides: 76 mg/dL (ref 0–149)
VLDL Cholesterol Cal: 15 mg/dL (ref 5–40)

## 2021-07-01 ENCOUNTER — Ambulatory Visit (INDEPENDENT_AMBULATORY_CARE_PROVIDER_SITE_OTHER): Payer: Medicare PPO | Admitting: Psychology

## 2021-07-01 DIAGNOSIS — F330 Major depressive disorder, recurrent, mild: Secondary | ICD-10-CM | POA: Diagnosis not present

## 2021-07-15 ENCOUNTER — Ambulatory Visit (INDEPENDENT_AMBULATORY_CARE_PROVIDER_SITE_OTHER): Payer: Medicare PPO | Admitting: Psychology

## 2021-07-15 DIAGNOSIS — F330 Major depressive disorder, recurrent, mild: Secondary | ICD-10-CM | POA: Diagnosis not present

## 2021-07-16 ENCOUNTER — Other Ambulatory Visit: Payer: Self-pay

## 2021-07-16 ENCOUNTER — Ambulatory Visit: Payer: Medicare PPO

## 2021-07-16 DIAGNOSIS — I739 Peripheral vascular disease, unspecified: Secondary | ICD-10-CM

## 2021-07-22 NOTE — Progress Notes (Signed)
Lower Extremity Arterial Duplex 07/16/2021: RIght mid, distal SFA and proximal popliteal artery appears occluded with collateral flow. Left distal SFA appears to be occluded.   This exam reveals mildly decreased perfusion of the right lower extremity, noted at the anterior tibial and post tibial artery level (ABI 0.9) and mildly decreased perfusion of the left lower extremity, noted at the post tibial artery level (ABI 0.9). No significant change from 04/22/2020.  Patient scheduled for PV angiogram

## 2021-07-29 ENCOUNTER — Ambulatory Visit (INDEPENDENT_AMBULATORY_CARE_PROVIDER_SITE_OTHER): Payer: Medicare PPO | Admitting: Psychology

## 2021-07-29 DIAGNOSIS — F330 Major depressive disorder, recurrent, mild: Secondary | ICD-10-CM | POA: Diagnosis not present

## 2021-07-30 ENCOUNTER — Other Ambulatory Visit: Payer: Self-pay

## 2021-07-30 ENCOUNTER — Ambulatory Visit (INDEPENDENT_AMBULATORY_CARE_PROVIDER_SITE_OTHER): Payer: Medicare PPO | Admitting: Cardiology

## 2021-07-30 ENCOUNTER — Encounter: Payer: Self-pay | Admitting: Cardiology

## 2021-07-30 VITALS — BP 109/70 | HR 75 | Temp 98.7°F | Resp 17 | Ht 59.0 in | Wt 156.0 lb

## 2021-07-30 DIAGNOSIS — I739 Peripheral vascular disease, unspecified: Secondary | ICD-10-CM

## 2021-07-30 DIAGNOSIS — I6523 Occlusion and stenosis of bilateral carotid arteries: Secondary | ICD-10-CM

## 2021-07-30 DIAGNOSIS — I10 Essential (primary) hypertension: Secondary | ICD-10-CM

## 2021-07-30 DIAGNOSIS — E7800 Pure hypercholesterolemia, unspecified: Secondary | ICD-10-CM

## 2021-07-30 MED ORDER — ACETAMINOPHEN-CODEINE #3 300-30 MG PO TABS: 1.0000 | ORAL_TABLET | Freq: Two times a day (BID) | ORAL | 0 refills | Status: DC | PRN

## 2021-07-30 MED ORDER — AMLODIPINE BESYLATE 5 MG PO TABS: ORAL_TABLET | ORAL | 3 refills | Status: DC

## 2021-07-30 NOTE — Progress Notes (Signed)
Primary Physician/Referring:  Marrian Salvage, FNP  Patient ID: [name] [name], female    DOB: [birth year], 70 y.o.   MRN: [id number]  Chief Complaint  Patient presents with   peripheral artery disease    6 weeks   Hyperlipidemia    HPI:    [name] [name]  is a 70 y.o. fAA female  with history of left carotid endarterectomy due to symptomatic carotid stenosis in 2012 with TIA, asymptomatic right carotid stenosis, bronchial asthma, hypertension, hyperlipidemia, hyperglycemia presents for 6 week OV.  [name] [name] is still chewing tobacco.   States that symptoms of claudication has recurred over the past 5 to 6 months, [name] [name] has started to gain weight.  Denies chest pain or dyspnea.  No symptoms to suggest TIA.  Past Medical History:  Diagnosis Date   Acid reflux    ALCOHOL ABUSE, HX OF 11/06/2007   ALLERGIC RHINITIS 11/06/2007   Anxiety 04/03/2011   ASTHMA 09/13/2007   Asthma    Chronic pain syndrome 12/15/2016   COLONIC POLYPS, HX OF 02/09/2008    ADENOMATOUS POLYP   Encounter for well adult exam without abnormal findings 04/03/2011   HYPERLIPIDEMIA 11/03/2010   HYPERTENSION 09/13/2007   HYPOTHYROIDISM 11/06/2007   Impaired glucose tolerance 07/30/2013   INSOMNIA, HX OF 09/13/2007   Lumbar degenerative disc disease 12/15/2016   OSTEOPOROSIS 11/06/2007   PERIMENOPAUSAL STATUS 09/13/2007   RLS (restless legs syndrome)    Stroke [facility])    TIA (transient ischemic attack) 11/04/2011   Past Surgical History:  Procedure Laterality Date   ABDOMINAL AORTOGRAM W/LOWER EXTREMITY N/A 08/27/2020   Procedure: ABDOMINAL AORTOGRAM W/LOWER EXTREMITY;  Surgeon: Nigel Mormon, MD;  Location: [facility];  Service: Cardiovascular;  Laterality: N/A;   BREAST EXCISIONAL BIOPSY Left    BREAST SURGERY  2008 and 2012   x 2 - benign, left side   carotid artery surgery Left    CESAREAN SECTION     x 3   COLONOSCOPY  multiple   2010   PERIPHERAL VASCULAR BALLOON ANGIOPLASTY  08/27/2020    Procedure: PERIPHERAL VASCULAR BALLOON ANGIOPLASTY;  Surgeon: Nigel Mormon, MD;  Location: [facility] [facility];  Service: Cardiovascular;;  Right SFA scoring balloon   TRANSFORAMINAL LUMBAR INTERBODY FUSION (TLIF) WITH PEDICLE SCREW FIXATION 1 LEVEL Left 07/10/2020   Procedure: LEFT-SIDED LUMBAR FOUR-FIVE TRANSFORAMINAL LUMBAR INTERBODY FUSION WITH INSTRUMENTATION AND ALLOGRAFT;  Surgeon: Phylliss Bob, MD;  Location: [facility];  Service: Orthopedics;  Laterality: Left;   Family History  Problem Relation Age of Onset   Hypertension Mother 82   Heart disease Mother    Heart failure Mother 29   Hypertension Brother    Colon cancer Brother 69   Dementia Maternal Grandmother    Breast cancer Cousin    Hyperlipidemia Other    Hypertension Other    Coronary artery disease Other     Social History   Tobacco Use   Smoking status: Former    Packs/day: 0.25    Types: Cigarettes    Quit date: 05/31/1979    Years since quitting: 42.1   Smokeless tobacco: Current    Types: Snuff   Tobacco comments:    social smoker  Substance Use Topics   Alcohol use: No    Comment: former alcholic   Marital Status: Divorced  ROS  Review of Systems  Cardiovascular:  Positive for claudication. Negative for dyspnea on exertion and leg swelling.  Respiratory:  Positive for wheezing (occasional).   Musculoskeletal:  Positive for arthritis and back pain (immproved since surgery).  Gastrointestinal:  Negative for melena.  All other systems reviewed and are negative. Objective  Blood pressure 109/70, pulse 75, temperature 98.7 F (37.1 C), temperature source Temporal, resp. rate 17, height '4\' 11"'$  (1.499 m), weight 156 lb (70.8 kg), SpO2 96 %.  Vitals with BMI 07/30/2021 06/17/2021 03/12/2021  Height '4\' 11"'$  '4\' 11"'$  '4\' 11"'$   Weight 156 lbs 155 lbs 151 lbs  BMI 31.49 0000000 [id number]  Systolic 0000000 [id number]  Diastolic 70 58 72  Pulse 75 77 82     Physical Exam Constitutional:      General: [name] [name] is not in acute  distress.    Comments: Short stature  HENT:     Head: Atraumatic.  Cardiovascular:     Rate and Rhythm: Normal rate and regular rhythm.     Pulses: Intact distal pulses.          Carotid pulses are  on the right side with bruit.      Popliteal pulses are 0 on the right side and 0 on the left side.       Dorsalis pedis pulses are 1+ on the right side and 1+ on the left side.       Posterior tibial pulses are 0 on the right side and 0 on the left side.     Heart sounds: S1 normal and S2 normal. No murmur heard.   No gallop.     Comments: Left carotid endarterectomy scar noted Pulmonary:     Effort: Pulmonary effort is normal.     Breath sounds: Normal breath sounds.  Abdominal:     General: Bowel sounds are normal.     Palpations: Abdomen is soft.   Laboratory examination:   Recent Labs    08/08/20 1408 06/17/21 1651  NA 139 141  K 4.1 4.3  CL 101 104  CO2 25 21  GLUCOSE 104* 103*  BUN 12 22  CREATININE 0.86 1.28*  CALCIUM 9.9 10.1  GFRNONAA 70  --   GFRAA 80  --    CrCl cannot be calculated (Patient's most recent lab result is older than the maximum 21 days allowed.).  CMP Latest Ref Rng & Units 06/17/2021 08/08/2020 07/10/2020  Glucose 65 - 99 mg/dL 103(H) 104(H) 123(H)  BUN 8 - 27 mg/dL '22 12 19  '$ Creatinine 0.57 - 1.00 mg/dL 1.28(H) 0.86 1.07(H)  Sodium 134 - 144 mmol/L 141 139 138  Potassium 3.5 - 5.2 mmol/L 4.3 4.1 3.5  Chloride 96 - 106 mmol/L 104 101 105  CO2 20 - 29 mmol/L '21 25 24  '$ Calcium 8.7 - 10.3 mg/dL 10.1 9.9 9.6  Total Protein 6.0 - 8.5 g/dL 6.5 - 6.6  Total Bilirubin 0.0 - 1.2 mg/dL 0.4 - 0.4  Alkaline Phos 44 - 121 IU/L 131(H) - 87  AST 0 - 40 IU/L 19 - 21  ALT 0 - 32 IU/L 14 - 19   CBC Latest Ref Rng & Units 06/17/2021 08/08/2020 07/10/2020  WBC 3.4 - 10.8 x10E3/uL 7.4 7.6 10.1  Hemoglobin 11.1 - 15.9 g/dL 12.3 11.1 11.8(L)  Hematocrit 34.0 - 46.6 % 36.2 32.4(L) 37.8  Platelets 150 - 450 x10E3/uL 267 328 245   Lipid Panel Recent Labs     08/08/20 1408 06/17/21 1651  CHOL 172 152  TRIG 60 76  LDLCALC 80 71  HDL 80 66    HEMOGLOBIN A1C Lab Results  Component Value Date   HGBA1C 6.4 10/16/2019  MPG 128 (H) 03/04/2011   TSH No results for input(s): TSH in the last 8760 hours.  Allergies   Allergies  Allergen Reactions   Fosamax [Alendronate Sodium] Nausea And Vomiting   Penicillins Hives    Did it involve swelling of the face/tongue/throat, SOB, or low BP? No Did it involve sudden or severe rash/hives, skin peeling, or any reaction on the inside of your mouth or nose? Yes Did you need to seek medical attention at a hospital or doctor's office? Yes When did it last happen? Over 10 years ago If all above answers are "NO", may proceed with cephalosporin use.    Medications Prior to Visit:   Outpatient Medications Prior to Visit  Medication Sig Dispense Refill   acetaminophen (TYLENOL) 650 MG CR tablet Take 1,300 mg by mouth every 8 (eight) hours as needed for pain.     aspirin EC 81 MG tablet Take 1 tablet (81 mg total) by mouth daily. 90 tablet 3   atorvastatin (LIPITOR) 40 MG tablet Take 1 tablet (40 mg total) by mouth daily. 90 tablet 3   benazepril-hydrochlorthiazide (LOTENSIN HCT) 10-12.5 MG tablet Take 1 tablet by mouth daily. 90 tablet 3   Calcium Carbonate-Vit D-Min (CALCIUM 1200 PO) Take 1,200 mg by mouth daily.     Cholecalciferol (VITAMIN D) 50 MCG (2000 UT) tablet Take 2,000 Units by mouth daily.     citalopram (CELEXA) 20 MG tablet Take 1 tablet (20 mg total) by mouth daily. 90 tablet 3   clopidogrel (PLAVIX) 75 MG tablet Take 1 tablet (75 mg total) by mouth daily. 90 tablet 3   diphenhydrAMINE-APAP, sleep, (TYLENOL PM EXTRA STRENGTH PO) Take 2 tablets by mouth at bedtime.      ezetimibe (ZETIA) 10 MG tablet TAKE 1 TABLET (10 MG TOTAL) BY MOUTH DAILY AFTER SUPPER. 90 tablet 3   Fluticasone-Salmeterol (ADVAIR DISKUS) 250-50 MCG/DOSE AEPB Inhale 1 puff into the lungs in the morning and at bedtime. 60  each 3   levothyroxine (SYNTHROID) 75 MCG tablet Take 1 tablet (75 mcg total) by mouth daily before breakfast. 90 tablet 2   metoprolol succinate (TOPROL-XL) 25 MG 24 hr tablet Take 1 tablet (25 mg total) by mouth daily. 90 tablet 3   Multiple Vitamin (MULTIVITAMIN WITH MINERALS) TABS tablet Take 1 tablet by mouth daily.     pantoprazole (PROTONIX) 40 MG tablet Take 1 tablet (40 mg total) by mouth 2 (two) times daily. 180 tablet 3   rOPINIRole (REQUIP) 0.5 MG tablet Take 1 tablet (0.5 mg total) by mouth at bedtime. 90 tablet 3   spironolactone (ALDACTONE) 25 MG tablet TAKE 1 TABLET BY MOUTH EVERY DAY IN THE MORNING 90 tablet 0   triamcinolone cream (KENALOG) 0.1 % APPLY TO AFFECTED AREA TWICE A DAY 454 g 0   zolpidem (AMBIEN) 10 MG tablet TAKE 1 TABLET (10 MG TOTAL) BY MOUTH AT BEDTIME AS NEEDED. FOR SLEEP 90 tablet 0   amLODipine (NORVASC) 5 MG tablet TAKE 1 TABLET BY MOUTH DAILY AFTER SUPPER. 90 tablet 0   sulfamethoxazole-trimethoprim (BACTRIM DS) 800-160 MG tablet Take 1 tablet by mouth 2 (two) times daily. 10 tablet 0   No facility-administered medications prior to visit.   Final Medications at End of Visit    Current Outpatient Medications  Medication Instructions   acetaminophen (TYLENOL) 1,300 mg, Oral, Every 8 hours PRN   acetaminophen-codeine (TYLENOL #3) 300-30 MG tablet 1 tablet, Oral, 2 times daily PRN   amLODipine (NORVASC) 5 MG tablet TAKE  1 TABLET BY MOUTH DAILY AFTER SUPPER.   aspirin EC 81 mg, Oral, Daily   atorvastatin (LIPITOR) 40 mg, Oral, Daily   benazepril-hydrochlorthiazide (LOTENSIN HCT) 10-12.5 MG tablet 1 tablet, Oral, Daily   Calcium Carbonate-Vit D-Min (CALCIUM 1200 PO) 1,200 mg, Oral, Daily   citalopram (CELEXA) 20 mg, Oral, Daily   clopidogrel (PLAVIX) 75 mg, Oral, Daily   diphenhydrAMINE-APAP, sleep, (TYLENOL PM EXTRA STRENGTH PO) 2 tablets, Oral, Daily at bedtime   ezetimibe (ZETIA) 10 mg, Oral, Daily after supper   Fluticasone-Salmeterol (ADVAIR DISKUS)  250-50 MCG/DOSE AEPB 1 puff, Inhalation, 2 times daily   levothyroxine (SYNTHROID) 75 mcg, Oral, Daily before breakfast   metoprolol succinate (TOPROL-XL) 25 mg, Oral, Daily   Multiple Vitamin (MULTIVITAMIN WITH MINERALS) TABS tablet 1 tablet, Oral, Daily   pantoprazole (PROTONIX) 40 mg, Oral, 2 times daily   rOPINIRole (REQUIP) 0.5 mg, Oral, Daily at bedtime   spironolactone (ALDACTONE) 25 MG tablet TAKE 1 TABLET BY MOUTH EVERY DAY IN THE MORNING   triamcinolone cream (KENALOG) 0.1 % APPLY TO AFFECTED AREA TWICE A DAY   Vitamin D 2,000 Units, Oral, Daily   zolpidem (AMBIEN) 10 mg, Oral, At bedtime PRN, for sleep    Radiology:   No results found.  Cardiac Studies:   Echocardiogram [id number]:  Normal systolic function, EF 123456.  Mild LVH.  Grade 2 diastolic dysfunction.  Moderately dilated left atrium. Mild thickening of mitral valve with mild calcification.  Mild aortic valve weakening and calcification.  Event Monitor for 14 days Start date 06/02/2019:  Predominant rhythm is normal sinus rhythm.:  Symptomatic transmissions of flutter and chest pain revealed normal sinus rhythm.  Automatic detected event revealed one episode of 12 beat (8 Sec) wide complex tachycardia suggestive of NSVT at 116 bpm at 2:30 PM on 06/10/2019.  Lexiscan Myoview Stress Test 06/26/2019: Resting EKG demonstrates sinus rhythm with short PR interval, borderline criteria for LVH.   stress EKG is nondiagnostic for ischemia as his of alcoholic stress test. Symptoms during test were SOB and Dizziness.  Myocardial pefusion imaging is normal. Left ventricular ejection fraction is  67% with normal wall motion. Low risk study.  Lower Extremity Arterial Duplex 04/22/2020:  Right mid SFA has monophasic waveform suggests diffuse disease and may  indicate hemodynamically significant stenosis.  No hemodynamically significant stenosis is identified in the left lower  extremity arterial system.  This exam reveals mildly  decreased perfusion of the right lower extremity,  noted at the dorsalis pedis and post tibial artery level (ABI 0.93) and  mildly decreased perfusion of the left lower extremity, noted at the  dorsalis pedis and post tibial artery level (ABI 0.93).   PERIPHERAL VASC BALLOON ANGIOPLASTY/ABD AORTOGRAM W/LOWER EXTREMITY 08/27/2020:  Rt SFA prox 70% diffuse, mid 95% focal stenosis Left SFA mid 20% stenosis Bilateral internal iliac ostial 50% stenosis, followed by small aneurysmal areas Three vessel runoff below the knee b/l Successful PTCA right SFA using chocolate balloon 5.0X120 mm 0% residual stenosis  ABI 10/02/2020:  This exam reveals mildly decreased perfusion of the bilateral  lower  extremity, noted at the anterior tibial artery level (ABI 0.95).  Compared  to the study done on 04/22/2020, bilateral ABI 0.95.  There is biphasic  waveforms noted at the level of the ankle bilaterally.  Carotid artery duplex 05/16/2021: Stenosis in the right internal carotid artery (50-69%). Stenosis in the right external carotid artery (<50%). Right ICA could not be visualized due to patient discomfort. Antegrade right vertebral artery flow.  H/O left carotid endarterectomy, patent by study  dated 04/22/2020, there is also mild progression of disease severity in the right ICA from <50%. Follow up in six months is appropriate if clinically indicated.  EKG:   EKG 06/17/2021: Normal sinus rhythm at rate of 71 bpm, left atrial enlargement, normal axis.  No evidence of ischemia.  No significant change from 02/05/2020.  Assessment     ICD-10-CM   1. Claudication in peripheral vascular disease (HCC)  I73.9 acetaminophen-codeine (TYLENOL #3) 300-30 MG tablet    2. Asymptomatic bilateral carotid artery stenosis  I65.23     3. Essential hypertension  I10 amLODipine (NORVASC) 5 MG tablet    4. Hypercholesteremia  E78.00       Meds ordered this encounter  Medications   amLODipine (NORVASC) 5 MG tablet     Sig: TAKE 1 TABLET BY MOUTH DAILY AFTER SUPPER.    Dispense:  90 tablet    Refill:  3   acetaminophen-codeine (TYLENOL #3) 300-30 MG tablet    Sig: Take 1 tablet by mouth 2 (two) times daily as needed for moderate pain.    Dispense:  30 tablet    Refill:  0     Medications Discontinued During This Encounter  Medication Reason   sulfamethoxazole-trimethoprim (BACTRIM DS) 800-160 MG tablet Error   amLODipine (NORVASC) 5 MG tablet Reorder   Recommendations:   [name] [name]  is a  70 y.o. AA female  with history of left carotid endarterectomy due to symptomatic carotid stenosis in 2012 with TIA, asymptomatic right carotid stenosis, bronchial asthma, hypertension, hyperlipidemia, hyperglycemia presents for 6 week OV.  [name] [name] is still chewing tobacco.   States that symptoms of claudication has recurred over the past 5 to 6 months, hence [name] [name] underwent lower extremity arterial duplex and presents for follow-up.  I reviewed the results, ABI is no change.  [name] [name] is still chewing tobacco and we discussed regarding continuing medical therapy and complete cessation of tobacco use and increasing physical activity will certainly improve her claudication.   I reviewed her labs, lipids are excellent control, blood pressure is also well controlled.  Overall her CV risks are well controlled except for mild obesity and continued tobacco use.  Carotid artery stenosis has remained stable, will continue surveillance duplex.  Due to severe peripheral neuropathy from vascular disease, [name] [name] request Tylenol 3, there is no abuse potential, 30 tablets were prescribed with no refills.  I will see her back in 6 months or sooner if problems.     Adrian Prows, MD, [facility] 07/31/2021, 6:45 PM Office: [phone] Fax: [phone] Pager: [phone]

## 2021-08-05 ENCOUNTER — Other Ambulatory Visit: Payer: Self-pay | Admitting: Family

## 2021-08-11 ENCOUNTER — Other Ambulatory Visit: Payer: Self-pay | Admitting: Family

## 2021-08-11 ENCOUNTER — Other Ambulatory Visit: Payer: Self-pay | Admitting: Cardiology

## 2021-08-11 DIAGNOSIS — I10 Essential (primary) hypertension: Secondary | ICD-10-CM

## 2021-08-11 DIAGNOSIS — Z1231 Encounter for screening mammogram for malignant neoplasm of breast: Secondary | ICD-10-CM

## 2021-08-12 ENCOUNTER — Ambulatory Visit (INDEPENDENT_AMBULATORY_CARE_PROVIDER_SITE_OTHER): Payer: Medicare PPO | Admitting: Psychology

## 2021-08-12 DIAGNOSIS — F330 Major depressive disorder, recurrent, mild: Secondary | ICD-10-CM

## 2021-08-19 ENCOUNTER — Other Ambulatory Visit: Payer: Self-pay | Admitting: Family

## 2021-08-26 ENCOUNTER — Ambulatory Visit (INDEPENDENT_AMBULATORY_CARE_PROVIDER_SITE_OTHER): Payer: Medicare PPO | Admitting: Psychology

## 2021-08-26 DIAGNOSIS — F330 Major depressive disorder, recurrent, mild: Secondary | ICD-10-CM

## 2021-09-09 ENCOUNTER — Ambulatory Visit (INDEPENDENT_AMBULATORY_CARE_PROVIDER_SITE_OTHER): Payer: Medicare PPO | Admitting: Psychology

## 2021-09-09 DIAGNOSIS — F330 Major depressive disorder, recurrent, mild: Secondary | ICD-10-CM

## 2021-09-15 ENCOUNTER — Other Ambulatory Visit: Payer: Self-pay | Admitting: Family

## 2021-09-15 NOTE — Telephone Encounter (Signed)
Patient is requesting a refill of the following medications: Requested Prescriptions   Pending Prescriptions Disp Refills   zolpidem (AMBIEN) 10 MG tablet [Pharmacy Med Name: ZOLPIDEM TARTRATE 10 MG TABLET] 90 tablet 0    Sig: TAKE 1 TABLET (10 MG TOTAL) BY MOUTH AT BEDTIME AS NEEDED. FOR SLEEP    Date of patient request: 09/15/21 Last office visit: 03/12/21 @[facility]  Date of last refill: 06/17/21 Last refill amount: 90+0  Follow up time period per chart: 10/07/21

## 2021-09-17 ENCOUNTER — Other Ambulatory Visit: Payer: Self-pay

## 2021-09-17 ENCOUNTER — Ambulatory Visit
Admission: RE | Admit: 2021-09-17 | Discharge: 2021-09-17 | Disposition: A | Discharge status: Home / Self Care | Payer: Medicare PPO | Source: Ambulatory Visit | Attending: Family | Admitting: Family

## 2021-09-17 DIAGNOSIS — Z1231 Encounter for screening mammogram for malignant neoplasm of breast: Secondary | ICD-10-CM

## 2021-09-23 ENCOUNTER — Ambulatory Visit (INDEPENDENT_AMBULATORY_CARE_PROVIDER_SITE_OTHER): Payer: Medicare PPO | Admitting: Psychology

## 2021-09-23 DIAGNOSIS — F330 Major depressive disorder, recurrent, mild: Secondary | ICD-10-CM

## 2021-10-07 ENCOUNTER — Ambulatory Visit (INDEPENDENT_AMBULATORY_CARE_PROVIDER_SITE_OTHER): Payer: Medicare PPO | Admitting: Family

## 2021-10-07 ENCOUNTER — Encounter: Payer: Self-pay | Admitting: Family

## 2021-10-07 ENCOUNTER — Other Ambulatory Visit: Payer: Self-pay

## 2021-10-07 VITALS — BP 138/70 | HR 70 | Temp 98.1°F | Ht 59.0 in | Wt 153.8 lb

## 2021-10-07 DIAGNOSIS — L309 Dermatitis, unspecified: Secondary | ICD-10-CM | POA: Diagnosis not present

## 2021-10-07 DIAGNOSIS — Z23 Encounter for immunization: Secondary | ICD-10-CM

## 2021-10-07 DIAGNOSIS — E039 Hypothyroidism, unspecified: Secondary | ICD-10-CM | POA: Diagnosis not present

## 2021-10-07 DIAGNOSIS — Z0000 Encounter for general adult medical examination without abnormal findings: Secondary | ICD-10-CM

## 2021-10-07 MED ORDER — PANTOPRAZOLE SODIUM 40 MG PO TBEC: 40.0000 mg | DELAYED_RELEASE_TABLET | Freq: Two times a day (BID) | ORAL | 3 refills | Status: DC

## 2021-10-07 MED ORDER — BENAZEPRIL-HYDROCHLOROTHIAZIDE 10-12.5 MG PO TABS: 1.0000 | ORAL_TABLET | Freq: Every day | ORAL | 3 refills | Status: DC

## 2021-10-07 MED ORDER — METOPROLOL SUCCINATE ER 25 MG PO TB24: 25.0000 mg | ORAL_TABLET | Freq: Every day | ORAL | 3 refills | Status: DC

## 2021-10-07 MED ORDER — ROPINIROLE HCL 0.5 MG PO TABS: 0.5000 mg | ORAL_TABLET | Freq: Every day | ORAL | 3 refills | Status: DC

## 2021-10-07 MED ORDER — FLUTICASONE-SALMETEROL 250-50 MCG/ACT IN AEPB: 1.0000 | INHALATION_SPRAY | Freq: Two times a day (BID) | RESPIRATORY_TRACT | 3 refills | Status: DC

## 2021-10-07 NOTE — Progress Notes (Signed)
[name] [name] is a 70 y.o. female with the following history as recorded in EpicCare:  Patient Active Problem List   Diagnosis Date Noted   Claudication in peripheral vascular disease ([place]) 08/26/2020   Neurogenic claudication ([place]) 07/10/2020   Degenerative lumbar spinal stenosis 05/16/2020   Sacroiliac pain 08/10/2019   Anxiety and depression 05/11/2019   Chest pain 05/09/2019   Arthritis of sacroiliac joint of both sides 05/01/2019   Polyarthralgia 12/28/2018   Preventative health care 10/20/2018   RLS (restless legs syndrome) 09/27/2018   Insomnia disorder related to known organic factor 06/27/2018   PLMD (periodic limb movement disorder) 06/27/2018   Hot flashes due to menopause 04/25/2018   Palpitations 04/25/2018   Diaphoresis 04/25/2018   Long term current use of antithrombotics/antiplatelets 04/19/2018   Insomnia 04/19/2018   Intractable episodic headache 02/28/2018   Numbness and tingling of right arm 02/28/2018   Toe pain, right 10/19/2017   Rotator cuff arthropathy of left shoulder 06/02/2017   Trigger thumb of left hand 06/02/2017   Lipoma 05/06/2017   Pain of left thumb 05/06/2017   Chronic pain syndrome 12/15/2016   Lumbar degenerative disc disease 12/15/2016   Left leg pain 01/28/2016   Left shoulder pain 04/09/2015   Impaired glucose tolerance 07/30/2013   Abnormal breath sounds 07/30/2013   Left carotid stenosis 05/05/2012   Anxiety 04/03/2011   CVA (cerebral infarction) 03/15/2011   HLD (hyperlipidemia) 11/03/2010   Hx of adenomatous colonic polyps 02/09/2008   Hypothyroidism 11/06/2007   ALLERGIC RHINITIS 11/06/2007   OSTEOPOROSIS 11/06/2007   ALCOHOL ABUSE, HX OF 11/06/2007   Essential hypertension 09/13/2007   Asthma 09/13/2007   PERIMENOPAUSAL STATUS 09/13/2007   Chronic insomnia 09/13/2007    Current Outpatient Medications  Medication Sig Dispense Refill   acetaminophen (TYLENOL) 650 MG CR tablet Take 1,300 mg by mouth every 8 (eight) hours  as needed for pain.     acetaminophen-codeine (TYLENOL #3) 300-30 MG tablet Take 1 tablet by mouth 2 (two) times daily as needed for moderate pain. 30 tablet 0   amLODipine (NORVASC) 5 MG tablet TAKE 1 TABLET BY MOUTH DAILY AFTER SUPPER. 90 tablet 3   aspirin EC 81 MG tablet Take 1 tablet (81 mg total) by mouth daily. 90 tablet 3   atorvastatin (LIPITOR) 40 MG tablet TAKE 1 TABLET BY MOUTH EVERY DAY 90 tablet 3   Calcium Carbonate-Vit D-Min (CALCIUM 1200 PO) Take 1,200 mg by mouth daily.     Cholecalciferol (VITAMIN D) 50 MCG (2000 UT) tablet Take 2,000 Units by mouth daily.     clopidogrel (PLAVIX) 75 MG tablet Take 1 tablet (75 mg total) by mouth daily. 90 tablet 3   diphenhydrAMINE-APAP, sleep, (TYLENOL PM EXTRA STRENGTH PO) Take 2 tablets by mouth at bedtime.      fluticasone-salmeterol (ADVAIR DISKUS) 250-50 MCG/ACT AEPB Inhale 1 puff into the lungs in the morning and at bedtime. 60 each 3   levothyroxine (SYNTHROID) 75 MCG tablet TAKE 1 TABLET BY MOUTH DAILY BEFORE BREAKFAST. 90 tablet 0   Multiple Vitamin (MULTIVITAMIN WITH MINERALS) TABS tablet Take 1 tablet by mouth daily.     spironolactone (ALDACTONE) 25 MG tablet TAKE 1 TABLET BY MOUTH EVERY DAY IN THE MORNING 90 tablet 1   triamcinolone cream (KENALOG) 0.1 % APPLY TO AFFECTED AREA TWICE A DAY 454 g 0   zolpidem (AMBIEN) 10 MG tablet TAKE 1 TABLET (10 MG TOTAL) BY MOUTH AT BEDTIME AS NEEDED. FOR SLEEP 90 tablet 0   benazepril-hydrochlorthiazide (  LOTENSIN HCT) 10-12.5 MG tablet Take 1 tablet by mouth daily. 90 tablet 3   citalopram (CELEXA) 20 MG tablet Take 1 tablet (20 mg total) by mouth daily. 90 tablet 3   ezetimibe (ZETIA) 10 MG tablet TAKE 1 TABLET (10 MG TOTAL) BY MOUTH DAILY AFTER SUPPER. 90 tablet 3   metoprolol succinate (TOPROL-XL) 25 MG 24 hr tablet Take 1 tablet (25 mg total) by mouth daily. 90 tablet 3   pantoprazole (PROTONIX) 40 MG tablet Take 1 tablet (40 mg total) by mouth 2 (two) times daily. 180 tablet 3    rOPINIRole (REQUIP) 0.5 MG tablet Take 1 tablet (0.5 mg total) by mouth at bedtime. 90 tablet 3   No current facility-administered medications for this visit.    Allergies: Fosamax [alendronate sodium] and Penicillins  Past Medical History:  Diagnosis Date   Acid reflux    ALCOHOL ABUSE, HX OF 11/06/2007   ALLERGIC RHINITIS 11/06/2007   Anxiety 04/03/2011   ASTHMA 09/13/2007   Asthma    Chronic pain syndrome 12/15/2016   COLONIC POLYPS, HX OF 02/09/2008    ADENOMATOUS POLYP   Encounter for well adult exam without abnormal findings 04/03/2011   HYPERLIPIDEMIA 11/03/2010   HYPERTENSION 09/13/2007   HYPOTHYROIDISM 11/06/2007   Impaired glucose tolerance 07/30/2013   INSOMNIA, HX OF 09/13/2007   Lumbar degenerative disc disease 12/15/2016   OSTEOPOROSIS 11/06/2007   PERIMENOPAUSAL STATUS 09/13/2007   RLS (restless legs syndrome)    Stroke [facility])    TIA (transient ischemic attack) 11/04/2011    Past Surgical History:  Procedure Laterality Date   ABDOMINAL AORTOGRAM W/LOWER EXTREMITY N/A 08/27/2020   Procedure: ABDOMINAL AORTOGRAM W/LOWER EXTREMITY;  Surgeon: Nigel Mormon, MD;  Location: [place] [facility];  Service: Cardiovascular;  Laterality: N/A;   BREAST EXCISIONAL BIOPSY Left    BREAST SURGERY  2008 and 2012   x 2 - benign, left side   carotid artery surgery Left    CESAREAN SECTION     x 3   COLONOSCOPY  multiple   2010   PERIPHERAL VASCULAR BALLOON ANGIOPLASTY  08/27/2020   Procedure: PERIPHERAL VASCULAR BALLOON ANGIOPLASTY;  Surgeon: Nigel Mormon, MD;  Location: [place] [facility];  Service: Cardiovascular;;  Right SFA scoring balloon   TRANSFORAMINAL LUMBAR INTERBODY FUSION (TLIF) WITH PEDICLE SCREW FIXATION 1 LEVEL Left 07/10/2020   Procedure: LEFT-SIDED LUMBAR FOUR-FIVE TRANSFORAMINAL LUMBAR INTERBODY FUSION WITH INSTRUMENTATION AND ALLOGRAFT;  Surgeon: Phylliss Bob, MD;  Location: [place];  Service: Orthopedics;  Laterality: Left;    Family History  Problem  Relation Age of Onset   Hypertension Mother 41   Heart disease Mother    Heart failure Mother 42   Hypertension Brother    Colon cancer Brother 33   Dementia Maternal Grandmother    Breast cancer Cousin    Hyperlipidemia Other    Hypertension Other    Coronary artery disease Other     Social History   Tobacco Use   Smoking status: Former    Packs/day: 0.25    Types: Cigarettes    Quit date: 05/31/1979    Years since quitting: 42.3   Smokeless tobacco: Current    Types: Snuff   Tobacco comments:    social smoker  Substance Use Topics   Alcohol use: No    Comment: former alcholic    Subjective:   Presents for yearly CPE; continuing to work with cardiology; no acute concerns today; up to date on dental and vision exams;  Review  of Systems  Constitutional: Negative.   HENT: Negative.    Eyes: Negative.   Respiratory: Negative.    Cardiovascular: Negative.   Gastrointestinal: Negative.   Genitourinary: Negative.   Musculoskeletal: Negative.   Skin: Negative.   Neurological: Negative.   Endo/Heme/Allergies: Negative.   Psychiatric/Behavioral: Negative.         Objective:  Vitals:   10/07/21 1300  BP: 138/70  Pulse: 70  Temp: 98.1 F (36.7 C)  TempSrc: Oral  SpO2: 96%  Weight: 153 lb 12.8 oz (69.8 kg)  Height: 4' 11"  (1.499 m)    General: Well developed, well nourished, in no acute distress  Skin : Warm and dry.  Head: Normocephalic and atraumatic  Eyes: Sclera and conjunctiva clear; pupils round and reactive to light; extraocular movements intact  Ears: External normal; canals clear; tympanic membranes normal  Oropharynx: Pink, supple. No suspicious lesions  Neck: Supple without thyromegaly, adenopathy  Lungs: Respirations unlabored; clear to auscultation bilaterally without wheeze, rales, rhonchi  CVS exam: normal rate and regular rhythm.  Abdomen: Soft; nontender; nondistended; normoactive bowel sounds; no masses or hepatosplenomegaly   Musculoskeletal: No deformities; no active joint inflammation  Extremities: No edema, cyanosis, clubbing  Vessels: Symmetric bilaterally  Neurologic: Alert and oriented; speech intact; face symmetrical; moves all extremities well; CNII-XII intact without focal deficit   Assessment:  1. PE (physical exam), annual   2. Eczema, unspecified type   3. Acquired hypothyroidism   4. Need for immunization against influenza     Plan:  Age appropriate preventive healthcare needs addressed; encouraged regular eye doctor and dental exams; encouraged regular exercise; will update labs and refills as needed today; follow-up to be determined; Refer to dermatology regarding persisting eczema on hands; Flu shot given; she will plan for Pneumovax #2 in 2023; plan for DEXA in 2023 with mammogram; she will get her Shingrix at pharmacy in early January 2023;   This visit occurred during the SARS-CoV-2 public health emergency.  Safety protocols were in place, including screening questions prior to the visit, additional usage of staff PPE, and extensive cleaning of exam room while observing appropriate contact time as indicated for disinfecting solutions.    No follow-ups on file.  Orders Placed This Encounter  Procedures   Flu Vaccine QUAD High Dose(Fluad)   CBC with Differential/Platelet   Comp Met (CMET)   TSH   Ambulatory referral to Dermatology    Referral Priority:   Routine    Referral Type:   Consultation    Referral Reason:   Specialty Services Required    Requested Specialty:   Dermatology    Number of Visits Requested:   1    Requested Prescriptions   Signed Prescriptions Disp Refills   benazepril-hydrochlorthiazide (LOTENSIN HCT) 10-12.5 MG tablet 90 tablet 3    Sig: Take 1 tablet by mouth daily.   fluticasone-salmeterol (ADVAIR DISKUS) 250-50 MCG/ACT AEPB 60 each 3    Sig: Inhale 1 puff into the lungs in the morning and at bedtime.   metoprolol succinate (TOPROL-XL) 25 MG 24 hr tablet  90 tablet 3    Sig: Take 1 tablet (25 mg total) by mouth daily.   pantoprazole (PROTONIX) 40 MG tablet 180 tablet 3    Sig: Take 1 tablet (40 mg total) by mouth 2 (two) times daily.   rOPINIRole (REQUIP) 0.5 MG tablet 90 tablet 3    Sig: Take 1 tablet (0.5 mg total) by mouth at bedtime.

## 2021-10-07 NOTE — Patient Instructions (Signed)
We will get your flu shot today; plan for Shingrix in 1 month at your pharmacy; you will get your Pneumovax in 2023;  Let me know before you schedule your mammogram in 2023 and I will add your bone density with it.  Please call back with the name of the muscle relaxer;

## 2021-10-08 ENCOUNTER — Telehealth: Payer: Self-pay | Admitting: Family

## 2021-10-08 LAB — COMPREHENSIVE METABOLIC PANEL
ALT: 15 U/L (ref 0–35)
AST: 18 U/L (ref 0–37)
Albumin: 4 g/dL (ref 3.5–5.2)
Alkaline Phosphatase: 106 U/L (ref 39–117)
BUN: 15 mg/dL (ref 6–23)
CO2: 28 mEq/L (ref 19–32)
Calcium: 9.8 mg/dL (ref 8.4–10.5)
Chloride: 102 mEq/L (ref 96–112)
Creatinine, Ser: 1.03 mg/dL (ref 0.40–1.20)
GFR: 55.5 mL/min — ABNORMAL LOW (ref >60.00)
Glucose, Bld: 103 mg/dL — ABNORMAL HIGH (ref 70–99)
Potassium: 4 mEq/L (ref 3.5–5.1)
Sodium: 138 mEq/L (ref 135–145)
Total Bilirubin: 0.5 mg/dL (ref 0.2–1.2)
Total Protein: 6.5 g/dL (ref 6.0–8.3)

## 2021-10-08 LAB — CBC WITH DIFFERENTIAL/PLATELET
Basophils Absolute: 0 10*3/uL (ref 0.0–0.1)
Basophils Relative: 0.5 % (ref 0.0–3.0)
Eosinophils Absolute: 0.1 10*3/uL (ref 0.0–0.7)
Eosinophils Relative: 2.1 % (ref 0.0–5.0)
HCT: 36.8 % (ref 36.0–46.0)
Hemoglobin: 12.1 g/dL (ref 12.0–15.0)
Lymphocytes Relative: 28.6 % (ref 12.0–46.0)
Lymphs Abs: 2 10*3/uL (ref 0.7–4.0)
MCHC: 32.9 g/dL (ref 30.0–36.0)
MCV: 93.5 fl (ref 78.0–100.0)
Monocytes Absolute: 0.7 10*3/uL (ref 0.1–1.0)
Monocytes Relative: 10.3 % (ref 3.0–12.0)
Neutro Abs: 4 10*3/uL (ref 1.4–7.7)
Neutrophils Relative %: 58.5 % (ref 43.0–77.0)
Platelets: 226 10*3/uL (ref 150.0–400.0)
RBC: 3.94 Mil/uL (ref 3.87–5.11)
RDW: 14.2 % (ref 11.5–15.5)
WBC: 6.8 10*3/uL (ref 4.0–10.5)

## 2021-10-08 LAB — TSH: TSH: 4.6 u[IU]/mL (ref 0.35–5.50)

## 2021-10-08 NOTE — Telephone Encounter (Signed)
Pt. Called and stated the name of the medication: methocarbamol 500mg  : muscle relaxer

## 2021-10-08 NOTE — Telephone Encounter (Signed)
Pt called stating she had wanted to give the name of the medication for muscle tension. Please advise.

## 2021-10-09 ENCOUNTER — Encounter: Payer: Self-pay | Admitting: Family

## 2021-10-09 ENCOUNTER — Other Ambulatory Visit: Payer: Self-pay | Admitting: Family

## 2021-10-09 MED ORDER — METHOCARBAMOL 500 MG PO TABS: 500.0000 mg | ORAL_TABLET | Freq: Three times a day (TID) | ORAL | 0 refills | Status: DC | PRN

## 2021-10-09 NOTE — Telephone Encounter (Signed)
Pt has called back and stated that she is taking methocarbamol 500mg .

## 2021-10-13 ENCOUNTER — Telehealth: Payer: Self-pay | Admitting: Family

## 2021-10-13 ENCOUNTER — Other Ambulatory Visit: Payer: Self-pay | Admitting: Family

## 2021-10-13 MED ORDER — LEVOTHYROXINE SODIUM 88 MCG PO TABS: 88.0000 ug | ORAL_TABLET | Freq: Every day | ORAL | 0 refills | Status: DC

## 2021-10-13 NOTE — Telephone Encounter (Signed)
Did you want to increase her Synthroid to mcg? Pt reported that she has taken the medication for the most part. She had only missed a few days this last refill.

## 2021-10-13 NOTE — Telephone Encounter (Signed)
Pt called and stated she had spoke with a nurse about upping her dose of  levothyroxine (SYNTHROID) 75 MCG tablet  but was unsure if the nurse wanted her to or not. Please advise.

## 2021-10-14 NOTE — Telephone Encounter (Signed)
The increased dose of medication has been sent in. I have called pt to inform her. There was no answer so I left a message to call back.

## 2021-10-21 ENCOUNTER — Ambulatory Visit (INDEPENDENT_AMBULATORY_CARE_PROVIDER_SITE_OTHER): Payer: Medicare PPO | Admitting: Psychology

## 2021-10-21 DIAGNOSIS — F330 Major depressive disorder, recurrent, mild: Secondary | ICD-10-CM

## 2021-10-24 ENCOUNTER — Other Ambulatory Visit: Payer: Self-pay | Admitting: Family

## 2021-11-04 ENCOUNTER — Ambulatory Visit (INDEPENDENT_AMBULATORY_CARE_PROVIDER_SITE_OTHER): Payer: Medicare PPO | Admitting: Psychology

## 2021-11-04 DIAGNOSIS — F330 Major depressive disorder, recurrent, mild: Secondary | ICD-10-CM

## 2021-11-18 ENCOUNTER — Ambulatory Visit (INDEPENDENT_AMBULATORY_CARE_PROVIDER_SITE_OTHER): Payer: Medicare PPO | Admitting: Psychology

## 2021-11-18 DIAGNOSIS — F330 Major depressive disorder, recurrent, mild: Secondary | ICD-10-CM | POA: Diagnosis not present

## 2021-12-02 ENCOUNTER — Ambulatory Visit: Payer: Medicare PPO | Admitting: Psychology

## 2021-12-09 ENCOUNTER — Other Ambulatory Visit: Payer: Self-pay

## 2021-12-09 ENCOUNTER — Ambulatory Visit: Payer: Medicare PPO | Admitting: Family

## 2021-12-09 ENCOUNTER — Ambulatory Visit (HOSPITAL_BASED_OUTPATIENT_CLINIC_OR_DEPARTMENT_OTHER)
Admission: RE | Admit: 2021-12-09 | Discharge: 2021-12-09 | Disposition: A | Discharge status: Home / Self Care | Payer: Medicare PPO | Source: Ambulatory Visit | Attending: Family | Admitting: Family

## 2021-12-09 ENCOUNTER — Other Ambulatory Visit: Payer: Self-pay | Admitting: Family

## 2021-12-09 VITALS — BP 120/70 | HR 71 | Temp 98.0°F | Ht 58.5 in | Wt 156.4 lb

## 2021-12-09 DIAGNOSIS — R0789 Other chest pain: Secondary | ICD-10-CM | POA: Insufficient documentation

## 2021-12-09 DIAGNOSIS — I739 Peripheral vascular disease, unspecified: Secondary | ICD-10-CM

## 2021-12-09 DIAGNOSIS — E039 Hypothyroidism, unspecified: Secondary | ICD-10-CM

## 2021-12-09 MED ORDER — ZOLPIDEM TARTRATE 10 MG PO TABS: 10.0000 mg | ORAL_TABLET | Freq: Every evening | ORAL | 0 refills | Status: DC | PRN

## 2021-12-09 MED ORDER — ACETAMINOPHEN-CODEINE #3 300-30 MG PO TABS: 1.0000 | ORAL_TABLET | Freq: Two times a day (BID) | ORAL | 0 refills | Status: DC | PRN

## 2021-12-09 MED ORDER — METHOCARBAMOL 500 MG PO TABS: 500.0000 mg | ORAL_TABLET | Freq: Three times a day (TID) | ORAL | 0 refills | Status: DC | PRN

## 2021-12-09 NOTE — Progress Notes (Signed)
[name] [name] is a 70 y.o. female with the following history as recorded in EpicCare:  Patient Active Problem List   Diagnosis Date Noted   Claudication in peripheral vascular disease ([place]) 08/26/2020   Neurogenic claudication ([place]) 07/10/2020   Degenerative lumbar spinal stenosis 05/16/2020   Sacroiliac pain 08/10/2019   Anxiety and depression 05/11/2019   Chest pain 05/09/2019   Arthritis of sacroiliac joint of both sides 05/01/2019   Polyarthralgia 12/28/2018   Preventative health care 10/20/2018   RLS (restless legs syndrome) 09/27/2018   Insomnia disorder related to known organic factor 06/27/2018   PLMD (periodic limb movement disorder) 06/27/2018   Hot flashes due to menopause 04/25/2018   Palpitations 04/25/2018   Diaphoresis 04/25/2018   Long term current use of antithrombotics/antiplatelets 04/19/2018   Insomnia 04/19/2018   Intractable episodic headache 02/28/2018   Numbness and tingling of right arm 02/28/2018   Toe pain, right 10/19/2017   Rotator cuff arthropathy of left shoulder 06/02/2017   Trigger thumb of left hand 06/02/2017   Lipoma 05/06/2017   Pain of left thumb 05/06/2017   Chronic pain syndrome 12/15/2016   Lumbar degenerative disc disease 12/15/2016   Left leg pain 01/28/2016   Left shoulder pain 04/09/2015   Impaired glucose tolerance 07/30/2013   Abnormal breath sounds 07/30/2013   Left carotid stenosis 05/05/2012   Anxiety 04/03/2011   CVA (cerebral infarction) 03/15/2011   HLD (hyperlipidemia) 11/03/2010   Hx of adenomatous colonic polyps 02/09/2008   Hypothyroidism 11/06/2007   ALLERGIC RHINITIS 11/06/2007   OSTEOPOROSIS 11/06/2007   ALCOHOL ABUSE, HX OF 11/06/2007   Essential hypertension 09/13/2007   Asthma 09/13/2007   PERIMENOPAUSAL STATUS 09/13/2007   Chronic insomnia 09/13/2007    Current Outpatient Medications  Medication Sig Dispense Refill   amLODipine (NORVASC) 5 MG tablet TAKE 1 TABLET BY MOUTH DAILY AFTER SUPPER. 90 tablet  3   aspirin EC 81 MG tablet Take 1 tablet (81 mg total) by mouth daily. 90 tablet 3   atorvastatin (LIPITOR) 40 MG tablet TAKE 1 TABLET BY MOUTH EVERY DAY 90 tablet 3   benazepril-hydrochlorthiazide (LOTENSIN HCT) 10-12.5 MG tablet Take 1 tablet by mouth daily. 90 tablet 3   Calcium Carbonate-Vit D-Min (CALCIUM 1200 PO) Take 1,200 mg by mouth daily.     Cholecalciferol (VITAMIN D) 50 MCG (2000 UT) tablet Take 2,000 Units by mouth daily.     citalopram (CELEXA) 20 MG tablet TAKE 1 TABLET BY MOUTH EVERY DAY 90 tablet 3   clopidogrel (PLAVIX) 75 MG tablet Take 1 tablet (75 mg total) by mouth daily. 90 tablet 3   diphenhydrAMINE-APAP, sleep, (TYLENOL PM EXTRA STRENGTH PO) Take 2 tablets by mouth at bedtime.      fluticasone-salmeterol (ADVAIR DISKUS) 250-50 MCG/ACT AEPB Inhale 1 puff into the lungs in the morning and at bedtime. 60 each 3   levothyroxine (SYNTHROID) 88 MCG tablet Take 1 tablet (88 mcg total) by mouth daily before breakfast. 90 tablet 0   metoprolol succinate (TOPROL-XL) 25 MG 24 hr tablet Take 1 tablet (25 mg total) by mouth daily. 90 tablet 3   montelukast (SINGULAIR) 10 MG tablet TAKE 1 TABLET BY MOUTH EVERY DAY 90 tablet 3   Multiple Vitamin (MULTIVITAMIN WITH MINERALS) TABS tablet Take 1 tablet by mouth daily.     pantoprazole (PROTONIX) 40 MG tablet Take 1 tablet (40 mg total) by mouth 2 (two) times daily. 180 tablet 3   rOPINIRole (REQUIP) 0.5 MG tablet Take 1 tablet (0.5 mg total) by  mouth at bedtime. 90 tablet 3   spironolactone (ALDACTONE) 25 MG tablet TAKE 1 TABLET BY MOUTH EVERY DAY IN THE MORNING 90 tablet 1   triamcinolone cream (KENALOG) 0.1 % APPLY TO AFFECTED AREA TWICE A DAY 454 g 0   acetaminophen-codeine (TYLENOL #3) 300-30 MG tablet Take 1 tablet by mouth 2 (two) times daily as needed for moderate pain. 30 tablet 0   ezetimibe (ZETIA) 10 MG tablet TAKE 1 TABLET (10 MG TOTAL) BY MOUTH DAILY AFTER SUPPER. 90 tablet 3   methocarbamol (ROBAXIN) 500 MG tablet Take 1  tablet (500 mg total) by mouth every 8 (eight) hours as needed. 30 tablet 0   zolpidem (AMBIEN) 10 MG tablet Take 1 tablet (10 mg total) by mouth at bedtime as needed. for sleep 90 tablet 0   No current facility-administered medications for this visit.    Allergies: Fosamax [alendronate sodium] and Penicillins  Past Medical History:  Diagnosis Date   Acid reflux    ALCOHOL ABUSE, HX OF 11/06/2007   ALLERGIC RHINITIS 11/06/2007   Anxiety 04/03/2011   ASTHMA 09/13/2007   Asthma    Chronic pain syndrome 12/15/2016   COLONIC POLYPS, HX OF 02/09/2008    ADENOMATOUS POLYP   Encounter for well adult exam without abnormal findings 04/03/2011   HYPERLIPIDEMIA 11/03/2010   HYPERTENSION 09/13/2007   HYPOTHYROIDISM 11/06/2007   Impaired glucose tolerance 07/30/2013   INSOMNIA, HX OF 09/13/2007   Lumbar degenerative disc disease 12/15/2016   OSTEOPOROSIS 11/06/2007   PERIMENOPAUSAL STATUS 09/13/2007   RLS (restless legs syndrome)    Stroke [facility])    TIA (transient ischemic attack) 11/04/2011    Past Surgical History:  Procedure Laterality Date   ABDOMINAL AORTOGRAM W/LOWER EXTREMITY N/A 08/27/2020   Procedure: ABDOMINAL AORTOGRAM W/LOWER EXTREMITY;  Surgeon: Nigel Mormon, MD;  Location: [place] [facility];  Service: Cardiovascular;  Laterality: N/A;   BREAST EXCISIONAL BIOPSY Left    BREAST SURGERY  2008 and 2012   x 2 - benign, left side   carotid artery surgery Left    CESAREAN SECTION     x 3   COLONOSCOPY  multiple   2010   PERIPHERAL VASCULAR BALLOON ANGIOPLASTY  08/27/2020   Procedure: PERIPHERAL VASCULAR BALLOON ANGIOPLASTY;  Surgeon: Nigel Mormon, MD;  Location: [place] [facility];  Service: Cardiovascular;;  Right SFA scoring balloon   TRANSFORAMINAL LUMBAR INTERBODY FUSION (TLIF) WITH PEDICLE SCREW FIXATION 1 LEVEL Left 07/10/2020   Procedure: LEFT-SIDED LUMBAR FOUR-FIVE TRANSFORAMINAL LUMBAR INTERBODY FUSION WITH INSTRUMENTATION AND ALLOGRAFT;  Surgeon: Phylliss Bob, MD;   Location: [place];  Service: Orthopedics;  Laterality: Left;    Family History  Problem Relation Age of Onset   Hypertension Mother 6   Heart disease Mother    Heart failure Mother 53   Hypertension Brother    Colon cancer Brother 31   Dementia Maternal Grandmother    Breast cancer Cousin    Hyperlipidemia Other    Hypertension Other    Coronary artery disease Other     Social History   Tobacco Use   Smoking status: Former    Packs/day: 0.25    Types: Cigarettes    Quit date: 05/31/1979    Years since quitting: 42.5   Smokeless tobacco: Current    Types: Snuff   Tobacco comments:    social smoker  Substance Use Topics   Alcohol use: No    Comment: former alcholic    Subjective:  1 week history of "chest  tightness." Suspects it is her asthma- having to use her albuterol more frequently; notes she used her albuterol yesterday 4 times with limited benefit; no sensation of irregular heartbeat/ not necessarily worse with activity- "just feels tight."     Objective:  Vitals:   12/09/21 1423  BP: 120/70  Pulse: 71  Temp: 98 F (36.7 C)  TempSrc: Oral  SpO2: 96%  Weight: 156 lb 6.4 oz (70.9 kg)  Height: 4' 10.5" (1.486 m)    General: Well developed, well nourished, in no acute distress  Skin : Warm and dry.  Head: Normocephalic and atraumatic  Eyes: Sclera and conjunctiva clear; pupils round and reactive to light; extraocular movements intact  Ears: External normal; canals clear; tympanic membranes normal  Oropharynx: Pink, supple. No suspicious lesions  Neck: Supple without thyromegaly, adenopathy  Lungs: Respirations unlabored; clear to auscultation bilaterally without wheeze, rales, rhonchi  CVS exam: normal rate and regular rhythm.  Neurologic: Alert and oriented; speech intact; face symmetrical; moves all extremities well; CNII-XII intact without focal deficit   Assessment:  1. Chest tightness   2. Claudication in peripheral vascular disease ([place])   3.  Acquired hypothyroidism     Plan:  Update EKG today- shows sinus rhythm; will also update CXR today; explained to patient that her symptoms are not consistent with asthma flare- no wheezing or coughing; want to get cardiac evaluation ASAP- she is scheduled tomorrow; since symptoms have been present for 1 week, will hold on ER evaluation today; follow up to be determined; Refill udpated on Tylenol #3 that she typically gets from her cardiologist; Check Tsh today;   This visit occurred during the SARS-CoV-2 public health emergency.  Safety protocols were in place, including screening questions prior to the visit, additional usage of staff PPE, and extensive cleaning of exam room while observing appropriate contact time as indicated for disinfecting solutions.    No follow-ups on file.  Orders Placed This Encounter  Procedures   DG Chest 2 View    Standing Status:   Future    Number of Occurrences:   1    Standing Expiration Date:   12/09/2022    Order Specific Question:   Reason for Exam (SYMPTOM  OR DIAGNOSIS REQUIRED)    Answer:   atypical chest pain    Order Specific Question:   Preferred imaging location?    Answer:   [facility]   CBC with Differential/Platelet   Comp Met (CMET)   TSH   EKG 12-Lead    Requested Prescriptions   Signed Prescriptions Disp Refills   acetaminophen-codeine (TYLENOL #3) 300-30 MG tablet 30 tablet 0    Sig: Take 1 tablet by mouth 2 (two) times daily as needed for moderate pain.   methocarbamol (ROBAXIN) 500 MG tablet 30 tablet 0    Sig: Take 1 tablet (500 mg total) by mouth every 8 (eight) hours as needed.   zolpidem (AMBIEN) 10 MG tablet 90 tablet 0    Sig: Take 1 tablet (10 mg total) by mouth at bedtime as needed. for sleep

## 2021-12-10 ENCOUNTER — Encounter: Payer: Self-pay | Admitting: Student

## 2021-12-10 ENCOUNTER — Ambulatory Visit: Payer: Medicare PPO | Admitting: Student

## 2021-12-10 VITALS — BP 106/66 | HR 78 | Temp 98.0°F | Resp 16 | Ht <= 58 in | Wt 157.0 lb

## 2021-12-10 DIAGNOSIS — R0602 Shortness of breath: Secondary | ICD-10-CM

## 2021-12-10 DIAGNOSIS — I10 Essential (primary) hypertension: Secondary | ICD-10-CM | POA: Diagnosis not present

## 2021-12-10 DIAGNOSIS — R072 Precordial pain: Secondary | ICD-10-CM | POA: Diagnosis not present

## 2021-12-10 DIAGNOSIS — L301 Dyshidrosis [pompholyx]: Secondary | ICD-10-CM | POA: Diagnosis not present

## 2021-12-10 LAB — CBC WITH DIFFERENTIAL/PLATELET
Basophils Absolute: 0 10*3/uL (ref 0.0–0.1)
Basophils Relative: 0.5 % (ref 0.0–3.0)
Eosinophils Absolute: 0.1 10*3/uL (ref 0.0–0.7)
Eosinophils Relative: 1.9 % (ref 0.0–5.0)
HCT: 35.8 % — ABNORMAL LOW (ref 36.0–46.0)
Hemoglobin: 11.9 g/dL — ABNORMAL LOW (ref 12.0–15.0)
Lymphocytes Relative: 27.6 % (ref 12.0–46.0)
Lymphs Abs: 2.1 10*3/uL (ref 0.7–4.0)
MCHC: 33.2 g/dL (ref 30.0–36.0)
MCV: 92.7 fl (ref 78.0–100.0)
Monocytes Absolute: 0.7 10*3/uL (ref 0.1–1.0)
Monocytes Relative: 9.7 % (ref 3.0–12.0)
Neutro Abs: 4.5 10*3/uL (ref 1.4–7.7)
Neutrophils Relative %: 60.3 % (ref 43.0–77.0)
Platelets: 227 10*3/uL (ref 150.0–400.0)
RBC: 3.87 Mil/uL (ref 3.87–5.11)
RDW: 14.5 % (ref 11.5–15.5)
WBC: 7.5 10*3/uL (ref 4.0–10.5)

## 2021-12-10 LAB — TSH: TSH: 1.18 u[IU]/mL (ref 0.35–5.50)

## 2021-12-10 LAB — COMPREHENSIVE METABOLIC PANEL
ALT: 15 U/L (ref 0–35)
AST: 18 U/L (ref 0–37)
Albumin: 4 g/dL (ref 3.5–5.2)
Alkaline Phosphatase: 108 U/L (ref 39–117)
BUN: 18 mg/dL (ref 6–23)
CO2: 26 mEq/L (ref 19–32)
Calcium: 9.6 mg/dL (ref 8.4–10.5)
Chloride: 104 mEq/L (ref 96–112)
Creatinine, Ser: 0.93 mg/dL (ref 0.40–1.20)
GFR: 62.66 mL/min (ref >60.00)
Glucose, Bld: 105 mg/dL — ABNORMAL HIGH (ref 70–99)
Potassium: 3.6 mEq/L (ref 3.5–5.1)
Sodium: 139 mEq/L (ref 135–145)
Total Bilirubin: 0.5 mg/dL (ref 0.2–1.2)
Total Protein: 6.5 g/dL (ref 6.0–8.3)

## 2021-12-10 LAB — TROPONIN I (HIGH SENSITIVITY): High Sens Troponin I: 6 ng/L (ref 2–17)

## 2021-12-10 NOTE — Progress Notes (Signed)
Primary Physician/Referring:  Marrian Salvage, FNP  Patient ID: [name] [name], female    DOB: 1951-09-07, 70 y.o.   MRN: [id number]  Chief Complaint  Patient presents with   Hypertension   Follow-up   Chest Pain    HPI:    [name] [name]  is a 70 y.o. AA female  with history of left carotid endarterectomy due to symptomatic carotid stenosis in 2012 with TIA, asymptomatic right carotid stenosis, bronchial asthma, hypertension, hyperlipidemia, hyperglycemia.  She is still chewing tobacco.   Patient presents for urgent visit at the request of her PCP.  Patient was seen by PCP yesterday with complaints of chest discomfort which she describes as pressure occurring intermittently over the last 1 week.  She states this is associated with right shoulder pain.  Episodes occur primarily at rest lasting anywhere from 10 to 30 minutes.  Patient is unable to identify any aggravating or alleviating factors.  She reports shortness of breath associated with these chest discomfort episodes.  Denies syncope, near syncope, orthopnea, PND, leg edema.  Denies symptoms suggestive of TIA or CVA.  Patient reports symptoms of claudication are presently well controlled on current medical regimen.  Past Medical History:  Diagnosis Date   Acid reflux    ALCOHOL ABUSE, HX OF 11/06/2007   ALLERGIC RHINITIS 11/06/2007   Anxiety 04/03/2011   ASTHMA 09/13/2007   Asthma    Chronic pain syndrome 12/15/2016   COLONIC POLYPS, HX OF 02/09/2008    ADENOMATOUS POLYP   Encounter for well adult exam without abnormal findings 04/03/2011   HYPERLIPIDEMIA 11/03/2010   HYPERTENSION 09/13/2007   HYPOTHYROIDISM 11/06/2007   Impaired glucose tolerance 07/30/2013   INSOMNIA, HX OF 09/13/2007   Lumbar degenerative disc disease 12/15/2016   OSTEOPOROSIS 11/06/2007   PERIMENOPAUSAL STATUS 09/13/2007   RLS (restless legs syndrome)    [facility])    TIA (transient ischemic attack) 11/04/2011   Past Surgical History:  Procedure  Laterality Date   ABDOMINAL AORTOGRAM W/LOWER EXTREMITY N/A 08/27/2020   Procedure: ABDOMINAL AORTOGRAM W/LOWER EXTREMITY;  Surgeon: Nigel Mormon, MD;  Location: [facility];  Service: Cardiovascular;  Laterality: N/A;   BREAST EXCISIONAL BIOPSY Left    BREAST SURGERY  2008 and 2012   x 2 - benign, left side   carotid artery surgery Left    CESAREAN SECTION     x 3   COLONOSCOPY  multiple   2010   PERIPHERAL VASCULAR BALLOON ANGIOPLASTY  08/27/2020   Procedure: PERIPHERAL VASCULAR BALLOON ANGIOPLASTY;  Surgeon: Nigel Mormon, MD;  Location: [facility];  Service: Cardiovascular;;  Right SFA scoring balloon   TRANSFORAMINAL LUMBAR INTERBODY FUSION (TLIF) WITH PEDICLE SCREW FIXATION 1 LEVEL Left 07/10/2020   Procedure: LEFT-SIDED LUMBAR FOUR-FIVE TRANSFORAMINAL LUMBAR INTERBODY FUSION WITH INSTRUMENTATION AND ALLOGRAFT;  Surgeon: Phylliss Bob, MD;  Location: [facility];  Service: Orthopedics;  Laterality: Left;   Family History  Problem Relation Age of Onset   Hypertension Mother 62   Heart disease Mother    Heart failure Mother 26   Hypertension Brother    Colon cancer Brother 79   Dementia Maternal Grandmother    Breast cancer Cousin    Hyperlipidemia Other    Hypertension Other    Coronary artery disease Other     Social History   Tobacco Use   Smoking status: Former    Packs/day: 0.25    Types: Cigarettes    Quit date: 05/31/1979    Years  since quitting: 42.5   Smokeless tobacco: Current    Types: Snuff   Tobacco comments:    social smoker  Substance Use Topics   Alcohol use: No    Comment: former alcholic   Marital Status: Divorced  ROS  Review of Systems  Cardiovascular:  Positive for chest pain (pressure) and claudication. Negative for dyspnea on exertion and leg swelling.  Respiratory:  Positive for shortness of breath and wheezing (occasional).   Musculoskeletal:  Positive for arthritis and back pain (immproved since surgery).   Gastrointestinal:  Negative for melena.  All other systems reviewed and are negative. Objective  Blood pressure 106/66, pulse 78, temperature 98 F (36.7 C), resp. rate 16, height 4\' 10"  (1.473 m), weight 157 lb (71.2 kg), SpO2 97 %.  Vitals with BMI 12/10/2021 12/09/2021 10/07/2021  Height 4\' 10"  4' 10.5" 4\' 11"   Weight 157 lbs 156 lbs 6 oz 153 lbs 13 oz  BMI 32.82 85.88 50.27  Systolic 741 287 867  Diastolic 66 70 70  Pulse 78 71 70     Physical Exam Vitals reviewed.  Constitutional:      General: She is not in acute distress.    Comments: Short stature  Cardiovascular:     Rate and Rhythm: Normal rate and regular rhythm.     Pulses: Intact distal pulses.          Carotid pulses are  on the right side with bruit.      Popliteal pulses are 0 on the right side and 0 on the left side.       Dorsalis pedis pulses are 1+ on the right side and 1+ on the left side.       Posterior tibial pulses are 0 on the right side and 0 on the left side.     Heart sounds: S1 normal and S2 normal. No murmur heard.   No gallop.     Comments: Left carotid endarterectomy scar noted Pulmonary:     Effort: Pulmonary effort is normal.     Breath sounds: Normal breath sounds.  Musculoskeletal:     Right lower leg: No edema.     Left lower leg: No edema.   Laboratory examination:   Recent Labs    06/17/21 1651 10/07/21 1351 12/09/21 1516  NA 141 138 139  K 4.3 4.0 3.6  CL 104 102 104  CO2 21 28 26   GLUCOSE 103* 103* 105*  BUN 22 15 18   CREATININE 1.28* 1.03 0.93  CALCIUM 10.1 9.8 9.6   estimated creatinine clearance is 47.8 mL/min (by C-G formula based on SCr of 0.93 mg/dL).  CMP Latest Ref Rng & Units 12/09/2021 10/07/2021 06/17/2021  Glucose 70 - 99 mg/dL 105(H) 103(H) 103(H)  BUN 6 - 23 mg/dL 18 15 22   Creatinine 0.40 - 1.20 mg/dL 0.93 1.03 1.28(H)  Sodium 135 - 145 mEq/L 139 138 141  Potassium 3.5 - 5.1 mEq/L 3.6 4.0 4.3  Chloride 96 - 112 mEq/L 104 102 104  CO2 19 - 32 mEq/L 26  28 21   Calcium 8.4 - 10.5 mg/dL 9.6 9.8 10.1  Total Protein 6.0 - 8.3 g/dL 6.5 6.5 6.5  Total Bilirubin 0.2 - 1.2 mg/dL 0.5 0.5 0.4  Alkaline Phos 39 - 117 U/L 108 106 131(H)  AST 0 - 37 U/L 18 18 19   ALT 0 - 35 U/L 15 15 14    CBC Latest Ref Rng & Units 12/09/2021 10/07/2021 06/17/2021  WBC 4.0 - 10.5 K/uL 7.5  6.8 7.4  Hemoglobin 12.0 - 15.0 g/dL 11.9(L) 12.1 12.3  Hematocrit 36.0 - 46.0 % 35.8(L) 36.8 36.2  Platelets 150.0 - 400.0 K/uL 227.0 226.0 267   Lipid Panel Recent Labs    06/17/21 1651  CHOL 152  TRIG 76  LDLCALC 71  HDL 66    HEMOGLOBIN A1C Lab Results  Component Value Date   HGBA1C 6.4 10/16/2019   MPG 128 (H) 03/04/2011   TSH Recent Labs    10/07/21 1351 12/09/21 1516  TSH 4.60 1.18    Allergies   Allergies  Allergen Reactions   Fosamax [Alendronate Sodium] Nausea And Vomiting   Penicillins Hives    Did it involve swelling of the face/tongue/throat, SOB, or low BP? No Did it involve sudden or severe rash/hives, skin peeling, or any reaction on the inside of your mouth or nose? Yes Did you need to seek medical attention at a hospital or doctor's office? Yes When did it last happen? Over 10 years ago If all above answers are NO, may proceed with cephalosporin use.    Medications Prior to Visit:   Outpatient Medications Prior to Visit  Medication Sig Dispense Refill   acetaminophen-codeine (TYLENOL #3) 300-30 MG tablet Take 1 tablet by mouth 2 (two) times daily as needed for moderate pain. 30 tablet 0   amLODipine (NORVASC) 5 MG tablet TAKE 1 TABLET BY MOUTH DAILY AFTER SUPPER. 90 tablet 3   aspirin EC 81 MG tablet Take 1 tablet (81 mg total) by mouth daily. 90 tablet 3   atorvastatin (LIPITOR) 40 MG tablet TAKE 1 TABLET BY MOUTH EVERY DAY 90 tablet 3   benazepril-hydrochlorthiazide (LOTENSIN HCT) 10-12.5 MG tablet Take 1 tablet by mouth daily. 90 tablet 3   Calcium Carbonate-Vit D-Min (CALCIUM 1200 PO) Take 1,200 mg by mouth daily.      Cholecalciferol (VITAMIN D) 50 MCG (2000 UT) tablet Take 2,000 Units by mouth daily.     citalopram (CELEXA) 20 MG tablet TAKE 1 TABLET BY MOUTH EVERY DAY 90 tablet 3   clopidogrel (PLAVIX) 75 MG tablet Take 1 tablet (75 mg total) by mouth daily. 90 tablet 3   fluticasone-salmeterol (ADVAIR DISKUS) 250-50 MCG/ACT AEPB Inhale 1 puff into the lungs in the morning and at bedtime. 60 each 3   levothyroxine (SYNTHROID) 88 MCG tablet Take 1 tablet (88 mcg total) by mouth daily before breakfast. 90 tablet 0   methocarbamol (ROBAXIN) 500 MG tablet Take 1 tablet (500 mg total) by mouth every 8 (eight) hours as needed. 30 tablet 0   metoprolol succinate (TOPROL-XL) 25 MG 24 hr tablet Take 1 tablet (25 mg total) by mouth daily. 90 tablet 3   montelukast (SINGULAIR) 10 MG tablet TAKE 1 TABLET BY MOUTH EVERY DAY 90 tablet 3   Multiple Vitamin (MULTIVITAMIN WITH MINERALS) TABS tablet Take 1 tablet by mouth daily.     pantoprazole (PROTONIX) 40 MG tablet Take 1 tablet (40 mg total) by mouth 2 (two) times daily. 180 tablet 3   rOPINIRole (REQUIP) 0.5 MG tablet Take 1 tablet (0.5 mg total) by mouth at bedtime. 90 tablet 3   spironolactone (ALDACTONE) 25 MG tablet TAKE 1 TABLET BY MOUTH EVERY DAY IN THE MORNING 90 tablet 1   triamcinolone cream (KENALOG) 0.1 % APPLY TO AFFECTED AREA TWICE A DAY 454 g 0   zolpidem (AMBIEN) 10 MG tablet Take 1 tablet (10 mg total) by mouth at bedtime as needed. for sleep 90 tablet 0   ezetimibe (ZETIA) 10  MG tablet TAKE 1 TABLET (10 MG TOTAL) BY MOUTH DAILY AFTER SUPPER. 90 tablet 3   diphenhydrAMINE-APAP, sleep, (TYLENOL PM EXTRA STRENGTH PO) Take 2 tablets by mouth at bedtime.      No facility-administered medications prior to visit.   Final Medications at End of Visit    Current Outpatient Medications  Medication Instructions   acetaminophen-codeine (TYLENOL #3) 300-30 MG tablet 1 tablet, Oral, 2 times daily PRN   amLODipine (NORVASC) 5 MG tablet TAKE 1 TABLET BY MOUTH DAILY  AFTER SUPPER.   aspirin EC 81 mg, Oral, Daily   atorvastatin (LIPITOR) 40 MG tablet TAKE 1 TABLET BY MOUTH EVERY DAY   benazepril-hydrochlorthiazide (LOTENSIN HCT) 10-12.5 MG tablet 1 tablet, Oral, Daily   Calcium Carbonate-Vit D-Min (CALCIUM 1200 PO) 1,200 mg, Oral, Daily   citalopram (CELEXA) 20 MG tablet TAKE 1 TABLET BY MOUTH EVERY DAY   clopidogrel (PLAVIX) 75 mg, Oral, Daily   ezetimibe (ZETIA) 10 mg, Oral, Daily after supper   fluticasone-salmeterol (ADVAIR DISKUS) 250-50 MCG/ACT AEPB 1 puff, Inhalation, 2 times daily   levothyroxine (SYNTHROID) 88 mcg, Oral, Daily before breakfast   methocarbamol (ROBAXIN) 500 mg, Oral, Every 8 hours PRN   metoprolol succinate (TOPROL-XL) 25 mg, Oral, Daily   montelukast (SINGULAIR) 10 MG tablet TAKE 1 TABLET BY MOUTH EVERY DAY   Multiple Vitamin (MULTIVITAMIN WITH MINERALS) TABS tablet 1 tablet, Oral, Daily   pantoprazole (PROTONIX) 40 mg, Oral, 2 times daily   rOPINIRole (REQUIP) 0.5 mg, Oral, Daily at bedtime   spironolactone (ALDACTONE) 25 MG tablet TAKE 1 TABLET BY MOUTH EVERY DAY IN THE MORNING   triamcinolone cream (KENALOG) 0.1 % APPLY TO AFFECTED AREA TWICE A DAY   Vitamin D 2,000 Units, Oral, Daily   zolpidem (AMBIEN) 10 mg, Oral, At bedtime PRN, for sleep    Radiology:   DG Chest 2 View  Result Date: 12/09/2021 CLINICAL DATA:  atypical chest pain EXAM: CHEST - 2 VIEW COMPARISON:  None. FINDINGS: The cardiomediastinal silhouette is within normal limits. No pleural effusion. No pneumothorax. No mass or consolidation. No acute osseous abnormality. Atherosclerotic calcifications of the aortic arch. IMPRESSION: No acute findings in the chest. Atherosclerotic calcifications of the aorta. Electronically Signed   By: Albin Felling M.D.   On: 12/09/2021 16:27    Cardiac Studies:   Echocardiogram 0/26/3785:  Normal systolic function, EF 88%.  Mild LVH.  Grade 2 diastolic dysfunction.  Moderately dilated left atrium. Mild thickening of mitral  valve with mild calcification.  Mild aortic valve weakening and calcification.  Event Monitor for 14 days Start date 06/02/2019:  Predominant rhythm is normal sinus rhythm.:  Symptomatic transmissions of flutter and chest pain revealed normal sinus rhythm.  Automatic detected event revealed one episode of 12 beat (8 Sec) wide complex tachycardia suggestive of NSVT at 116 bpm at 2:30 PM on 06/10/2019.  Lexiscan Myoview Stress Test 06/26/2019: Resting EKG demonstrates sinus rhythm with short PR interval, borderline criteria for LVH.   stress EKG is nondiagnostic for ischemia as his of alcoholic stress test. Symptoms during test were SOB and Dizziness.  Myocardial pefusion imaging is normal. Left ventricular ejection fraction is  67% with normal wall motion. Low risk study.  Lower Extremity Arterial Duplex 04/22/2020:  Right mid SFA has monophasic waveform suggests diffuse disease and may  indicate hemodynamically significant stenosis.  No hemodynamically significant stenosis is identified in the left lower  extremity arterial system.  This exam reveals mildly decreased perfusion of the right lower extremity,  noted at the dorsalis pedis and post tibial artery level (ABI 0.93) and  mildly decreased perfusion of the left lower extremity, noted at the  dorsalis pedis and post tibial artery level (ABI 0.93).   PERIPHERAL VASC BALLOON ANGIOPLASTY/ABD AORTOGRAM W/LOWER EXTREMITY 08/27/2020:  Rt SFA prox 70% diffuse, mid 95% focal stenosis Left SFA mid 20% stenosis Bilateral internal iliac ostial 50% stenosis, followed by small aneurysmal areas Three vessel runoff below the knee b/l Successful PTCA right SFA using chocolate balloon 5.0X120 mm 0% residual stenosis  ABI 10/02/2020:  This exam reveals mildly decreased perfusion of the bilateral  lower  extremity, noted at the anterior tibial artery level (ABI 0.95).  Compared  to the study done on 04/22/2020, bilateral ABI 0.95.  There is biphasic   waveforms noted at the level of the ankle bilaterally.  Carotid artery duplex 05/16/2021: Stenosis in the right internal carotid artery (50-69%). Stenosis in the right external carotid artery (<50%). Right ICA could not be visualized due to patient discomfort. Antegrade right vertebral artery flow. H/O left carotid endarterectomy, patent by study  dated 04/22/2020, there is also mild progression of disease severity in the right ICA from <50%. Follow up in six months is appropriate if clinically indicated.  EKG:  12/02/2021: Sinus rhythm at a rate of 71 bpm. LAE.  Normal axis.  Nonspecific T wave abnormality.  No evidence of acute ischemia or injury.  EKG 06/17/2021: Normal sinus rhythm at rate of 71 bpm, left atrial enlargement, normal axis.  No evidence of ischemia.  No significant change from 02/05/2020.  Assessment     ICD-10-CM   1. Essential hypertension  I10 EKG 12-Lead    2. Precordial pain  R07.2 PCV ECHOCARDIOGRAM COMPLETE    PCV MYOCARDIAL PERFUSION WITH LEXISCAN    3. Shortness of breath  R06.02 PCV ECHOCARDIOGRAM COMPLETE    PCV MYOCARDIAL PERFUSION WITH LEXISCAN      No orders of the defined types were placed in this encounter.    Medications Discontinued During This Encounter  Medication Reason   diphenhydrAMINE-APAP, sleep, (TYLENOL PM EXTRA STRENGTH PO)    Recommendations:   [name] [name]  is a  70 y.o. AA female  with history of left carotid endarterectomy due to symptomatic carotid stenosis in 2012 with TIA, asymptomatic right carotid stenosis, bronchial asthma, hypertension, hyperlipidemia, hyperglycemia.  She is still chewing tobacco.   Patient presents for urgent visit at the request of her PCP for chest discomfort.  Patient's symptoms are quite atypical, however she has multiple cardiovascular risk factors can therefore recommend further cardiac evaluation.  Recommend ischemic evaluation with Lexiscan nuclear stress test as she would not be a good treadmill  candidate due to PAD with claudication.  We will also obtain echocardiogram given shortness of breath and precordial pain.  Presently patient's symptoms of claudication are well controlled with her current medical therapy.  Unfortunately patient does continue to use tobacco, again advised her regarding the importance of discontinuing tobacco use.  Patient is due for repeat carotid artery surveillance, we will schedule this at this time.  I personally reviewed external records, troponin done yesterday was negative.  EKG today is without acute changes.  Counseled patient regarding signs symptoms that would warrant urgent or emergent evaluation, she verbalized understanding and agreement.  Further recommendations pending results of cardiac testing.    [name] Berthold, PA-C 12/10/2021, 4:19 PM Office: [phone]

## 2021-12-12 ENCOUNTER — Other Ambulatory Visit: Payer: Self-pay | Admitting: Cardiology

## 2021-12-12 DIAGNOSIS — I10 Essential (primary) hypertension: Secondary | ICD-10-CM

## 2021-12-16 ENCOUNTER — Ambulatory Visit (INDEPENDENT_AMBULATORY_CARE_PROVIDER_SITE_OTHER): Payer: Medicare PPO | Admitting: Psychology

## 2021-12-16 DIAGNOSIS — F330 Major depressive disorder, recurrent, mild: Secondary | ICD-10-CM | POA: Diagnosis not present

## 2021-12-16 NOTE — Progress Notes (Signed)
Progress Note Start: 12/16/2021 12:00 PM End: 12/16/2021 12:55 PM Diagnosis 307.89 (Other pain disorders related to psychological factors)  33.0 Major Depressive Disorder, recurrent, mild   Symptoms Complains of generalized pain in many joints, muscles, and bones that debilitates normal functioning. (Status: maintained) -- No Description Entered  Decrease or loss of appetite. (Status: maintained) -- No Description Entered  Depressed or irritable mood. (Status: maintained) -- No Description Entered  Exhibits signs and symptoms of depression. (Status: maintained) -- No Description Entered  Experiences an increase in general physical discomfort (e.g., fatigue, night sweats, insomnia, muscle tension, body aches). (Status: maintained) -- No Description Entered  Experiences back or neck pain, interstitial cystitis, or diabetic neuropathy. (Status: maintained) -- No Description Entered  Experiences intermittent pain such as that related to rheumatoid arthritis or irritable bowel syndrome. (Status: maintained) -- No Description Entered  Lack of energy. (Status: maintained) -- No Description Entered  Sleeplessness or hypersomnia. (Status: maintained) -- No Description Entered  Unresolved grief issues. (Status: maintained) -- No Description Entered  Medication Status compliance  Safety none  If Suicidal or Homicidal State Action Taken: unspecified  Current Risk: low Medications unspecified Objectives Related Problem: Accept the chronic pain and move on with life as much as possible. Description: Incorporate physical exercise into daily routine. Target Date: 2022-01-28 Frequency: Biweekly Modality: individual Progress: 10%  Related Problem: Accept the chronic pain and move on with life as much as possible. Description: Integrate and implement all new mental, somatic, and behavioral ways of managing pain. Target Date: 2022-01-28 Frequency: Biweekly Modality: individual Progress:  10%  Related Problem: Accept the chronic pain and move on with life as much as possible. Description: Engage in positive self-talk as an alternative to the depressing, negative thoughts about self and the world. Target Date: 2022-01-28 Frequency: Biweekly Modality: individual Progress: 20%  Related Problem: Accept the chronic pain and move on with life as much as possible. Description: Learn and implement specific coping skills as well as when and how to use them to manage pain and its consequences. Target Date: 2022-01-28 Frequency: Biweekly Modality: individual Progress: 30%  Related Problem: Accept the chronic pain and move on with life as much as possible. Description: Learn and implement calming skills such as relaxation, biofeedback, or mindfulness meditation to ease pain. Target Date: 2022-01-28 Frequency: Biweekly Modality: individual Progress: 10%  Related Problem: Accept the chronic pain and move on with life as much as possible. Description: Identify and monitor specific pain triggers. Target Date: 2022-01-28 Frequency: Biweekly Modality: individual Progress: 30%  Related Problem: Accept the chronic pain and move on with life as much as possible. Description: Increase the level and range of activity by identifying and engaging in values-consistent pleasurable activities. Target Date: 2022-01-28 Frequency: Biweekly Modality: individual Progress: 20%  Related Problem: Accept the chronic pain and move on with life as much as possible. Description: Implement relapse prevention strategies for managing future challenges. Target Date: 2022-01-28 Frequency: Biweekly Modality: individual Progress: 0%  Related Problem: Develop healthy thinking patterns and beliefs about self, others, and the world that lead to the alleviation and help prevent the relapse of depression. Description: Learn and implement problem-solving and decision-making skills. Target Date:  2022-01-28 Frequency: Biweekly Modality: individual Progress: 30%  Related Problem: Develop healthy thinking patterns and beliefs about self, others, and the world that lead to the alleviation and help prevent the relapse of depression. Description: Identify important people in life, past and present, and describe the quality, good and poor, of those relationships. Target  Date: 2022-01-28 Frequency: Biweekly Modality: individual Progress: 30%  Related Problem: Develop healthy thinking patterns and beliefs about self, others, and the world that lead to the alleviation and help prevent the relapse of depression. Description: Increasingly verbalize hopeful and positive statements regarding self, others, and the future. Target Date: 2022-01-28 Frequency: Biweekly Modality: individual Progress: 40%  Related Problem: Develop healthy thinking patterns and beliefs about self, others, and the world that lead to the alleviation and help prevent the relapse of depression. Description: Verbalize insight into how past relationships may be influencing current experiences with depression. Target Date: 2022-01-28 Frequency: Biweekly Modality: individual Progress: 30%  Related Problem: Develop healthy thinking patterns and beliefs about self, others, and the world that lead to the alleviation and help prevent the relapse of depression. Description: Verbalize an understanding of healthy and unhealthy emotions with the intent of increasing the use of healthy emotions to guide actions. Target Date: 2022-01-28 Frequency: Biweekly Modality: individual Progress: 20%  Related Problem: Develop healthy thinking patterns and beliefs about self, others, and the world that lead to the alleviation and help prevent the relapse of depression. Description: Learn and implement conflict resolution skills to resolve interpersonal problems. Target Date: 2022-01-28 Frequency: Biweekly Modality: individual Progress:  20%  Client Response full compliance  Service Location Location, [address] [place], [name] [address]  Service Code cpt [id number]  Identify/label emotions  Self care activities  Validate/empathize  Facilitate problem solving  Assess/facilitate readiness to change  Supportive Psychotherapy, Grief and Loss Work   Comments  Today I met with [name] [name] in a remote video (WebEx) face to face individual psychotherapy an accommodation during the [place] pandemic.   Distance Site: Client's Home  Originating Site: Therapist's Remote Office  Consent: Obtained verbal consent to transmit session remotely    [name] [name] reports that her daughter and grandson came to stay with her over the holidays.  She was having back pain and had an irregular heart beat.  It made her rather anxious and it helped to have someone close by.  She has a f/u appointment on 1/23.  We d/ what is likely to occur at the appointment.    [name] [name] shared that she had a moment when she got emotional thinking about her mother.  I did some grief work with [name] [name].  Nevertheless,she  feels like she is doing better.   ________________________________Bucket List: Go to [facility] [employer] and touch a few "pretty fish" ________________________________  Home Practice: continue to execute new morning routine Progress: Patients mood seems improved aeb less tearful or overwhelmed when talking about her mother, increased desire to plan for the future, anticipating the future in a positive way, participating in yoga class and anticipating social activities with friends she has isolated from .   [name] [name], Ph.D.

## 2021-12-30 ENCOUNTER — Ambulatory Visit (INDEPENDENT_AMBULATORY_CARE_PROVIDER_SITE_OTHER): Payer: Medicare PPO | Admitting: Psychology

## 2021-12-30 DIAGNOSIS — F330 Major depressive disorder, recurrent, mild: Secondary | ICD-10-CM | POA: Diagnosis not present

## 2021-12-30 NOTE — Progress Notes (Signed)
° ° ° ° ° ° ° ° ° ° ° ° ° ° °  [name] [name] Ann Marvetta Vohs, PhD °

## 2021-12-30 NOTE — Progress Notes (Signed)
Progress Note  Start: 12/30/2021 12:00 PM End: 12/30/2021 12:55 PM Diagnosis 307.89 (Other pain disorders related to psychological factors)  33.0 Major Depressive Disorder, recurrent, mild   Symptoms Complains of generalized pain in many joints, muscles, and bones that debilitates normal functioning. (Status: maintained) -- No Description Entered  Decrease or loss of appetite. (Status: maintained) -- No Description Entered  Depressed or irritable mood. (Status: maintained) -- No Description Entered  Exhibits signs and symptoms of depression. (Status: maintained) -- No Description Entered  Experiences an increase in general physical discomfort (e.g., fatigue, night sweats, insomnia, muscle tension, body aches). (Status: maintained) -- No Description Entered  Experiences back or neck pain, interstitial cystitis, or diabetic neuropathy. (Status: maintained) -- No Description Entered  Experiences intermittent pain such as that related to rheumatoid arthritis or irritable bowel syndrome. (Status: maintained) -- No Description Entered  Lack of energy. (Status: maintained) -- No Description Entered  Sleeplessness or hypersomnia. (Status: maintained) -- No Description Entered  Unresolved grief issues. (Status: maintained) -- No Description Entered  Medication Status compliance  Safety none  If Suicidal or Homicidal State Action Taken: unspecified  Current Risk: low Medications unspecified Objectives Related Problem: Accept the chronic pain and move on with life as much as possible. Description: Incorporate physical exercise into daily routine. Target Date: 2022-01-28 Frequency: Biweekly Modality: individual Progress: 10%  Related Problem: Accept the chronic pain and move on with life as much as possible. Description: Integrate and implement all new mental, somatic, and behavioral ways of managing pain. Target Date: 2022-01-28 Frequency: Biweekly Modality: individual Progress:  10%  Related Problem: Accept the chronic pain and move on with life as much as possible. Description: Engage in positive self-talk as an alternative to the depressing, negative thoughts about self and the world. Target Date: 2022-01-28 Frequency: Biweekly Modality: individual Progress: 20%  Related Problem: Accept the chronic pain and move on with life as much as possible. Description: Learn and implement specific coping skills as well as when and how to use them to manage pain and its consequences. Target Date: 2022-01-28 Frequency: Biweekly Modality: individual Progress: 30%  Related Problem: Accept the chronic pain and move on with life as much as possible. Description: Learn and implement calming skills such as relaxation, biofeedback, or mindfulness meditation to ease pain. Target Date: 2022-01-28 Frequency: Biweekly Modality: individual Progress: 10%  Related Problem: Accept the chronic pain and move on with life as much as possible. Description: Identify and monitor specific pain triggers. Target Date: 2022-01-28 Frequency: Biweekly Modality: individual Progress: 30%  Related Problem: Accept the chronic pain and move on with life as much as possible. Description: Increase the level and range of activity by identifying and engaging in values-consistent pleasurable activities. Target Date: 2022-01-28 Frequency: Biweekly Modality: individual Progress: 20%  Related Problem: Accept the chronic pain and move on with life as much as possible. Description: Implement relapse prevention strategies for managing future challenges. Target Date: 2022-01-28 Frequency: Biweekly Modality: individual Progress: 0%  Related Problem: Develop healthy thinking patterns and beliefs about self, others, and the world that lead to the alleviation and help prevent the relapse of depression. Description: Learn and implement problem-solving and decision-making skills. Target Date:  2022-01-28 Frequency: Biweekly Modality: individual Progress: 30%  Related Problem: Develop healthy thinking patterns and beliefs about self, others, and the world that lead to the alleviation and help prevent the relapse of depression. Description: Identify important people in life, past and present, and describe the quality, good and poor, of those relationships.  Target Date: 2022-01-28 Frequency: Biweekly Modality: individual Progress: 30%  Related Problem: Develop healthy thinking patterns and beliefs about self, others, and the world that lead to the alleviation and help prevent the relapse of depression. Description: Increasingly verbalize hopeful and positive statements regarding self, others, and the future. Target Date: 2022-01-28 Frequency: Biweekly Modality: individual Progress: 40%  Related Problem: Develop healthy thinking patterns and beliefs about self, others, and the world that lead to the alleviation and help prevent the relapse of depression. Description: Verbalize insight into how past relationships may be influencing current experiences with depression. Target Date: 2022-01-28 Frequency: Biweekly Modality: individual Progress: 30%  Related Problem: Develop healthy thinking patterns and beliefs about self, others, and the world that lead to the alleviation and help prevent the relapse of depression. Description: Verbalize an understanding of healthy and unhealthy emotions with the intent of increasing the use of healthy emotions to guide actions. Target Date: 2022-01-28 Frequency: Biweekly Modality: individual Progress: 20%  Related Problem: Develop healthy thinking patterns and beliefs about self, others, and the world that lead to the alleviation and help prevent the relapse of depression. Description: Learn and implement conflict resolution skills to resolve interpersonal problems. Target Date: 2022-01-28 Frequency: Biweekly Modality: individual Progress:  20%  Client Response full compliance  Service Location Location, [address] [place], [place] [address]  Service Code cpt [phone]) Identify/label emotions  Self care activities  Validate/empathize  Facilitate problem solving  Assess/facilitate readiness to change  Supportive Psychotherapy, Grief and Loss Work   Comments  Today I met with [name] [name] in a remote video (WebEx) face to face individual psychotherapy an accommodation during the [place] pandemic.   Distance Site: Client's Home  Originating Site: Therapist's Remote Office  Consent: Obtained verbal consent to transmit session remotely    [name] [name] reports that it was a rough week.  She says that it was a lonely dull week.  When queried, she states that it isn't not the weather.  She has been worrying about her hand and she dreamt about her mother and grandmother "night after night."  We talked about how this may have affected her mood.  I guided her in reminiscing about her mother.  She was able to identify those things she missed and was longing for.    [name] [name] has nearly paid off her home mortgage ans she is ruminating over getting it paying off.  I gave her space to talk about her goals and that this is a big achievement for her. ________________________________Bucket List: Go to [place] [employer], Snorkel and touch a few "pretty fish" ________________________________  Home Practice: continue to execute new morning routine Progress: Patients mood seems improved aeb less tearful or overwhelmed when talking about her mother, increased desire to plan for the future, anticipating the future in a positive way, participating in yoga class and anticipating social activities with friends she has isolated from .   [name] Crochet, PhD

## 2022-01-05 ENCOUNTER — Ambulatory Visit: Payer: Medicare PPO

## 2022-01-05 ENCOUNTER — Other Ambulatory Visit: Payer: Self-pay

## 2022-01-05 DIAGNOSIS — R0602 Shortness of breath: Secondary | ICD-10-CM

## 2022-01-05 DIAGNOSIS — R072 Precordial pain: Secondary | ICD-10-CM | POA: Diagnosis not present

## 2022-01-07 ENCOUNTER — Other Ambulatory Visit: Payer: Self-pay | Admitting: Family

## 2022-01-08 DIAGNOSIS — L403 Pustulosis palmaris et plantaris: Secondary | ICD-10-CM | POA: Diagnosis not present

## 2022-01-08 DIAGNOSIS — Z79899 Other long term (current) drug therapy: Secondary | ICD-10-CM | POA: Diagnosis not present

## 2022-01-08 DIAGNOSIS — L400 Psoriasis vulgaris: Secondary | ICD-10-CM | POA: Diagnosis not present

## 2022-01-13 ENCOUNTER — Ambulatory Visit (INDEPENDENT_AMBULATORY_CARE_PROVIDER_SITE_OTHER): Payer: Medicare PPO | Admitting: Psychology

## 2022-01-13 DIAGNOSIS — F330 Major depressive disorder, recurrent, mild: Secondary | ICD-10-CM

## 2022-01-13 NOTE — Progress Notes (Signed)
Progress Note  Start: 01/13/2022 12:00 PM End: 01/13/2022 12:55 PM Diagnosis 307.89 (Other pain disorders related to psychological factors)  33.0 Major Depressive Disorder, recurrent, mild   Symptoms Complains of generalized pain in many joints, muscles, and bones that debilitates normal functioning. (Status: maintained) -- No Description Entered  Decrease or loss of appetite. (Status: maintained) -- No Description Entered  Depressed or irritable mood. (Status: maintained) -- No Description Entered  Exhibits signs and symptoms of depression. (Status: maintained) -- No Description Entered  Experiences an increase in general physical discomfort (e.g., fatigue, night sweats, insomnia, muscle tension, body aches). (Status: maintained) -- No Description Entered  Experiences back or neck pain, interstitial cystitis, or diabetic neuropathy. (Status: maintained) -- No Description Entered  Experiences intermittent pain such as that related to rheumatoid arthritis or irritable bowel syndrome. (Status: maintained) -- No Description Entered  Lack of energy. (Status: maintained) -- No Description Entered  Sleeplessness or hypersomnia. (Status: maintained) -- No Description Entered  Unresolved grief issues. (Status: maintained) -- No Description Entered  Medication Status compliance  Safety none  If Suicidal or Homicidal State Action Taken: unspecified  Current Risk: low Medications unspecified Objectives Related Problem: Accept the chronic pain and move on with life as much as possible. Description: Incorporate physical exercise into daily routine. Target Date: 2022-01-28 Frequency: Biweekly Modality: individual Progress: 10%  Related Problem: Accept the chronic pain and move on with life as much as possible. Description: Integrate and implement all new mental, somatic, and behavioral ways of managing pain. Target Date: 2022-01-28 Frequency: Biweekly Modality: individual Progress:  10%  Related Problem: Accept the chronic pain and move on with life as much as possible. Description: Engage in positive self-talk as an alternative to the depressing, negative thoughts about self and the world. Target Date: 2022-01-28 Frequency: Biweekly Modality: individual Progress: 20%  Related Problem: Accept the chronic pain and move on with life as much as possible. Description: Learn and implement specific coping skills as well as when and how to use them to manage pain and its consequences. Target Date: 2022-01-28 Frequency: Biweekly Modality: individual Progress: 30%  Related Problem: Accept the chronic pain and move on with life as much as possible. Description: Learn and implement calming skills such as relaxation, biofeedback, or mindfulness meditation to ease pain. Target Date: 2022-01-28 Frequency: Biweekly Modality: individual Progress: 10%  Related Problem: Accept the chronic pain and move on with life as much as possible. Description: Identify and monitor specific pain triggers. Target Date: 2022-01-28 Frequency: Biweekly Modality: individual Progress: 30%  Related Problem: Accept the chronic pain and move on with life as much as possible. Description: Increase the level and range of activity by identifying and engaging in values-consistent pleasurable activities. Target Date: 2022-01-28 Frequency: Biweekly Modality: individual Progress: 20%  Related Problem: Accept the chronic pain and move on with life as much as possible. Description: Implement relapse prevention strategies for managing future challenges. Target Date: 2022-01-28 Frequency: Biweekly Modality: individual Progress: 0%  Related Problem: Develop healthy thinking patterns and beliefs about self, others, and the world that lead to the alleviation and help prevent the relapse of depression. Description: Learn and implement problem-solving and decision-making skills. Target Date:  2022-01-28 Frequency: Biweekly Modality: individual Progress: 30%  Related Problem: Develop healthy thinking patterns and beliefs about self, others, and the world that lead to the alleviation and help prevent the relapse of depression. Description: Identify important people in life, past and present, and describe the quality, good and poor, of those relationships.  Target Date: 2022-01-28 Frequency: Biweekly Modality: individual Progress: 30%  Related Problem: Develop healthy thinking patterns and beliefs about self, others, and the world that lead to the alleviation and help prevent the relapse of depression. Description: Increasingly verbalize hopeful and positive statements regarding self, others, and the future. Target Date: 2022-01-28 Frequency: Biweekly Modality: individual Progress: 40%  Related Problem: Develop healthy thinking patterns and beliefs about self, others, and the world that lead to the alleviation and help prevent the relapse of depression. Description: Verbalize insight into how past relationships may be influencing current experiences with depression. Target Date: 2022-01-28 Frequency: Biweekly Modality: individual Progress: 30%  Related Problem: Develop healthy thinking patterns and beliefs about self, others, and the world that lead to the alleviation and help prevent the relapse of depression. Description: Verbalize an understanding of healthy and unhealthy emotions with the intent of increasing the use of healthy emotions to guide actions. Target Date: 2022-01-28 Frequency: Biweekly Modality: individual Progress: 20%  Related Problem: Develop healthy thinking patterns and beliefs about self, others, and the world that lead to the alleviation and help prevent the relapse of depression. Description: Learn and implement conflict resolution skills to resolve interpersonal problems. Target Date: 2022-01-28 Frequency: Biweekly Modality: individual Progress:  20%  Client Response full compliance  Service Location Location, [address] [place],  [address]  Service Code cpt [phone]) Identify/label emotions  Self care activities  Validate/empathize  Facilitate problem solving  Assess/facilitate readiness to change  Supportive Psychotherapy, Grief and Loss Work   Comments  Today I met with Tifany Desire in a remote video (WebEx) face to face individual psychotherapy an accommodation during the [place] pandemic.   Distance Site: Client's Home  Originating Site: Therapist's Remote Office  Consent: Obtained verbal consent to transmit session remotely    Novalynn reports that it was a tough week.  She says that she went in for two different doctor visits.  The cardiologist has some concern for her cardiovascular health but Merin wasn't able to communicate what the issue was.  She also was disappointed by the dermatologist who wasn't able to diagnose the painful rash on her hand.  It was her daughter who diagnosed that she had a subdermal eczema.  Orpah c/o about the cost of the Rx cream what cost $300 and the trouble she had with getting the office to phone her insurance t get a pre-authorization.  She used the session to vent her disappointment, frustration and worry about the state of her health.  Lastly, she shared her concerns abut her son who is scheduled for surgery (stint ).  _______________________________ [employer] List: Go to [place] [employer], Snorkel and touch a few "pretty fish" ________________________________  Home Practice: continue to execute new morning routine Progress: Patients mood seems improved aeb less tearful or overwhelmed when talking about her mother, increased desire to plan for the future, anticipating the future in a positive way, participating in yoga class and anticipating social activities with friends she has isolated from .    Royetta Crochet, PhD

## 2022-01-27 ENCOUNTER — Ambulatory Visit (INDEPENDENT_AMBULATORY_CARE_PROVIDER_SITE_OTHER): Payer: Medicare PPO | Admitting: Psychology

## 2022-01-27 DIAGNOSIS — F330 Major depressive disorder, recurrent, mild: Secondary | ICD-10-CM

## 2022-01-27 NOTE — Progress Notes (Signed)
Progress Note  Start: 01/26/2022 12:00 PM End: 01/26/2022 12:55 PM Diagnosis 307.89 (Other pain disorders related to psychological factors)  33.0 Major Depressive Disorder, recurrent, mild   Symptoms Complains of generalized pain in many joints, muscles, and bones that debilitates normal functioning. (Status: maintained) -- No Description Entered  Decrease or loss of appetite. (Status: maintained) -- No Description Entered  Depressed or irritable mood. (Status: maintained) -- No Description Entered  Exhibits signs and symptoms of depression. (Status: maintained) -- No Description Entered  Experiences an increase in general physical discomfort (e.g., fatigue, night sweats, insomnia, muscle tension, body aches). (Status: maintained) -- No Description Entered  Experiences back or neck pain, interstitial cystitis, or diabetic neuropathy. (Status: maintained) -- No Description Entered  Experiences intermittent pain such as that related to rheumatoid arthritis or irritable bowel syndrome. (Status: maintained) -- No Description Entered  Lack of energy. (Status: maintained) -- No Description Entered  Sleeplessness or hypersomnia. (Status: maintained) -- No Description Entered  Unresolved grief issues. (Status: maintained) -- No Description Entered  Medication Status compliance  Safety none  If Suicidal or Homicidal State Action Taken: unspecified  Current Risk: low Medications unspecified Objectives Related Problem: Accept the chronic pain and move on with life as much as possible. Description: Incorporate physical exercise into daily routine. Target Date: 2022-01-28 Frequency: Biweekly Modality: individual Progress: 10%  Related Problem: Accept the chronic pain and move on with life as much as possible. Description: Integrate and implement all new mental, somatic, and behavioral ways of managing pain. Target Date: 2022-01-28 Frequency: Biweekly Modality: individual Progress:  10%  Related Problem: Accept the chronic pain and move on with life as much as possible. Description: Engage in positive self-talk as an alternative to the depressing, negative thoughts about self and the world. Target Date: 2022-01-28 Frequency: Biweekly Modality: individual Progress: 20%  Related Problem: Accept the chronic pain and move on with life as much as possible. Description: Learn and implement specific coping skills as well as when and how to use them to manage pain and its consequences. Target Date: 2022-01-28 Frequency: Biweekly Modality: individual Progress: 30%  Related Problem: Accept the chronic pain and move on with life as much as possible. Description: Learn and implement calming skills such as relaxation, biofeedback, or mindfulness meditation to ease pain. Target Date: 2022-01-28 Frequency: Biweekly Modality: individual Progress: 10%  Related Problem: Accept the chronic pain and move on with life as much as possible. Description: Identify and monitor specific pain triggers. Target Date: 2022-01-28 Frequency: Biweekly Modality: individual Progress: 30%  Related Problem: Accept the chronic pain and move on with life as much as possible. Description: Increase the level and range of activity by identifying and engaging in values-consistent pleasurable activities. Target Date: 2022-01-28 Frequency: Biweekly Modality: individual Progress: 20%  Related Problem: Accept the chronic pain and move on with life as much as possible. Description: Implement relapse prevention strategies for managing future challenges. Target Date: 2022-01-28 Frequency: Biweekly Modality: individual Progress: 0%  Related Problem: Develop healthy thinking patterns and beliefs about self, others, and the world that lead to the alleviation and help prevent the relapse of depression. Description: Learn and implement problem-solving and decision-making skills. Target Date:  2022-01-28 Frequency: Biweekly Modality: individual Progress: 30%  Related Problem: Develop healthy thinking patterns and beliefs about self, others, and the world that lead to the alleviation and help prevent the relapse of depression. Description: Identify important people in life, past and present, and describe the quality, good and poor, of those relationships.  Target Date: 2022-01-28 Frequency: Biweekly Modality: individual Progress: 30%  Related Problem: Develop healthy thinking patterns and beliefs about self, others, and the world that lead to the alleviation and help prevent the relapse of depression. Description: Increasingly verbalize hopeful and positive statements regarding self, others, and the future. Target Date: 2022-01-28 Frequency: Biweekly Modality: individual Progress: 40%  Related Problem: Develop healthy thinking patterns and beliefs about self, others, and the world that lead to the alleviation and help prevent the relapse of depression. Description: Verbalize insight into how past relationships may be influencing current experiences with depression. Target Date: 2022-01-28 Frequency: Biweekly Modality: individual Progress: 30%  Related Problem: Develop healthy thinking patterns and beliefs about self, others, and the world that lead to the alleviation and help prevent the relapse of depression. Description: Verbalize an understanding of healthy and unhealthy emotions with the intent of increasing the use of healthy emotions to guide actions. Target Date: 2022-01-28 Frequency: Biweekly Modality: individual Progress: 20%  Related Problem: Develop healthy thinking patterns and beliefs about self, others, and the world that lead to the alleviation and help prevent the relapse of depression. Description: Learn and implement conflict resolution skills to resolve interpersonal problems. Target Date: 2022-01-28 Frequency: Biweekly Modality: individual Progress:  20%  Client Response full compliance  Service Location Location, [address] [place], [place] [address]  Service Code cpt [phone]) Identify/label emotions  Self care activities  Validate/empathize  Facilitate problem solving Life style changes  Assess/facilitate readiness to change Motivational interviewing  Supportive Psychotherapy, Grief and Loss Work   Comments  Today I met with [name] [name] in a remote video (WebEx) face to face individual psychotherapy an accommodation during the [place] pandemic.   Distance Site: Client's Home  Originating Site: Therapist's Remote Office  Consent: Obtained verbal consent to transmit session remotely    [name] [name] reports that she continues to have pain in her legs and chaffed skin on her hands.  She shared the struggle she's had to get the medications she needs, her anxiety about getting another surgery and problems to get her insurance to cover her medications.  We once again talked about her walking to improve the circulation in her legs.  She admits that she doesn't want to and if she doesn't want to do something she won't.  I talked about the need to tune into what helps to motivate her (motivational interviewing).  In this case, she wants to avoid another surgery and walking/exercising might help to put it off for a while.  We will continue to work on motivating her to move more.  _______________________________ [employer] List: Go to [place] [employer], Snorkel and touch a few "pretty fish" ________________________________  Home Practice: continue to execute new morning routine Progress: Patients mood seems improved aeb less tearful or overwhelmed when talking about her mother, increased desire to plan for the future, anticipating the future in a positive way, participating in yoga class and anticipating social activities with friends she has isolated from .    Royetta Crochet, PhD

## 2022-01-30 ENCOUNTER — Other Ambulatory Visit: Payer: Self-pay

## 2022-01-30 ENCOUNTER — Ambulatory Visit: Payer: Medicare PPO | Admitting: Cardiology

## 2022-01-30 ENCOUNTER — Encounter: Payer: Self-pay | Admitting: Cardiology

## 2022-01-30 VITALS — BP 113/69 | HR 91 | Temp 97.5°F | Resp 16 | Ht <= 58 in | Wt 156.4 lb

## 2022-01-30 DIAGNOSIS — I6523 Occlusion and stenosis of bilateral carotid arteries: Secondary | ICD-10-CM | POA: Diagnosis not present

## 2022-01-30 DIAGNOSIS — I10 Essential (primary) hypertension: Secondary | ICD-10-CM | POA: Diagnosis not present

## 2022-01-30 DIAGNOSIS — R002 Palpitations: Secondary | ICD-10-CM | POA: Diagnosis not present

## 2022-01-30 DIAGNOSIS — I739 Peripheral vascular disease, unspecified: Secondary | ICD-10-CM | POA: Diagnosis not present

## 2022-01-30 DIAGNOSIS — E7800 Pure hypercholesterolemia, unspecified: Secondary | ICD-10-CM

## 2022-01-30 MED ORDER — DILTIAZEM HCL ER COATED BEADS 180 MG PO CP24: 180.0000 mg | ORAL_CAPSULE | Freq: Every day | ORAL | 3 refills | Status: DC

## 2022-01-30 NOTE — Progress Notes (Signed)
Primary Physician/Referring:  Marrian Salvage, FNP  Patient ID: Jennette Kettle Hagerman, female    DOB: [birth year], 71 y.o.   MRN: [id number]  Chief Complaint  Patient presents with   PAD   Carotid Stenosis    Hypertension   Follow-up    6 months    HPI:    [name] [name]  is a 71 y.o. AA female  with history of left carotid endarterectomy due to symptomatic carotid stenosis in 2012 with TIA, asymptomatic right carotid stenosis, PAD with history of right SFA angioplasty, bronchial asthma, hypertension, hyperlipidemia, hyperglycemia.  She is still chewing tobacco but has reduced and is planning to quit soon.   She has not had any further chest pain episodes. C/O palpitations and feels like the heart skips beats.  Denies syncope, near syncope, orthopnea, PND, leg edema.  Denies symptoms suggestive of TIA or CVA.  Patient reports symptoms of claudication are presently well controlled on current medical regimen.  Past Medical History:  Diagnosis Date   Acid reflux    ALCOHOL ABUSE, HX OF 11/06/2007   ALLERGIC RHINITIS 11/06/2007   Anxiety 04/03/2011   ASTHMA 09/13/2007   Asthma    Chronic pain syndrome 12/15/2016   COLONIC POLYPS, HX OF 02/09/2008    ADENOMATOUS POLYP   Encounter for well adult exam without abnormal findings 04/03/2011   HYPERLIPIDEMIA 11/03/2010   HYPERTENSION 09/13/2007   HYPOTHYROIDISM 11/06/2007   Impaired glucose tolerance 07/30/2013   INSOMNIA, HX OF 09/13/2007   Lumbar degenerative disc disease 12/15/2016   OSTEOPOROSIS 11/06/2007   PERIMENOPAUSAL STATUS 09/13/2007   RLS (restless legs syndrome)    Stroke [facility])    TIA (transient ischemic attack) 11/04/2011   Past Surgical History:  Procedure Laterality Date   ABDOMINAL AORTOGRAM W/LOWER EXTREMITY N/A 08/27/2020   Procedure: ABDOMINAL AORTOGRAM W/LOWER EXTREMITY;  Surgeon: Nigel Mormon, MD;  Location: [facility];  Service: Cardiovascular;  Laterality: N/A;   BREAST EXCISIONAL BIOPSY Left     BREAST SURGERY  2008 and 2012   x 2 - benign, left side   carotid artery surgery Left    CESAREAN SECTION     x 3   COLONOSCOPY  multiple   2010   PERIPHERAL VASCULAR BALLOON ANGIOPLASTY  08/27/2020   Procedure: PERIPHERAL VASCULAR BALLOON ANGIOPLASTY;  Surgeon: Nigel Mormon, MD;  Location: [facility];  Service: Cardiovascular;;  Right SFA scoring balloon   TRANSFORAMINAL LUMBAR INTERBODY FUSION (TLIF) WITH PEDICLE SCREW FIXATION 1 LEVEL Left 07/10/2020   Procedure: LEFT-SIDED LUMBAR FOUR-FIVE TRANSFORAMINAL LUMBAR INTERBODY FUSION WITH INSTRUMENTATION AND ALLOGRAFT;  Surgeon: Phylliss Bob, MD;  Location: [facility];  Service: Orthopedics;  Laterality: Left;   Social History   Tobacco Use   Smoking status: Former    Packs/day: 0.25    Types: Cigarettes    Quit date: 05/31/1979    Years since quitting: 42.7   Smokeless tobacco: Current    Types: Snuff   Tobacco comments:    social smoker  Substance Use Topics   Alcohol use: No    Comment: former alcholic   Marital Status: Divorced  ROS  Review of Systems  Cardiovascular:  Positive for claudication and palpitations. Negative for chest pain, dyspnea on exertion and leg swelling.  Gastrointestinal:  Negative for melena.  Objective  Blood pressure 113/69, pulse 91, temperature (!) 97.5 F (36.4 C), temperature source Temporal, resp. rate 16, height 4\' 10"  (1.473 m), weight 156 lb 6.4 oz (70.9 kg), SpO2 96 %.  Vitals with BMI 01/30/2022 12/10/2021 12/09/2021  Height 4\' 10"  4\' 10"  4' 10.5"  Weight 156 lbs 6 oz 157 lbs 156 lbs 6 oz  BMI 32.7 66.29 47.65  Systolic 465 035 465  Diastolic 69 66 70  Pulse 91 78 71     Physical Exam Vitals reviewed.  Constitutional:      Comments: Short stature  Cardiovascular:     Rate and Rhythm: Normal rate and regular rhythm.     Pulses: Intact distal pulses.          Carotid pulses are  on the right side with bruit.      Popliteal pulses are 0 on the right side and 0 on the left  side.       Dorsalis pedis pulses are 1+ on the right side and 1+ on the left side.       Posterior tibial pulses are 0 on the right side and 0 on the left side.     Heart sounds: S1 normal and S2 normal. No murmur heard.   No gallop.     Comments: Left carotid endarterectomy scar noted Pulmonary:     Effort: Pulmonary effort is normal.     Breath sounds: Normal breath sounds.  Abdominal:     General: Abdomen is flat.     Palpations: Abdomen is soft.  Musculoskeletal:     Right lower leg: No edema.     Left lower leg: No edema.  Skin:    Capillary Refill: Capillary refill takes less than 2 seconds.   Laboratory examination:   Recent Labs    06/17/21 1651 10/07/21 1351 12/09/21 1516  NA 141 138 139  K 4.3 4.0 3.6  CL 104 102 104  CO2 21 28 26   GLUCOSE 103* 103* 105*  BUN 22 15 18   CREATININE 1.28* 1.03 0.93  CALCIUM 10.1 9.8 9.6   CrCl cannot be calculated (Patient's most recent lab result is older than the maximum 21 days allowed.).  CMP Latest Ref Rng & Units 12/09/2021 10/07/2021 06/17/2021  Glucose 70 - 99 mg/dL 105(H) 103(H) 103(H)  BUN 6 - 23 mg/dL 18 15 22   Creatinine 0.40 - 1.20 mg/dL 0.93 1.03 1.28(H)  Sodium 135 - 145 mEq/L 139 138 141  Potassium 3.5 - 5.1 mEq/L 3.6 4.0 4.3  Chloride 96 - 112 mEq/L 104 102 104  CO2 19 - 32 mEq/L 26 28 21   Calcium 8.4 - 10.5 mg/dL 9.6 9.8 10.1  Total Protein 6.0 - 8.3 g/dL 6.5 6.5 6.5  Total Bilirubin 0.2 - 1.2 mg/dL 0.5 0.5 0.4  Alkaline Phos 39 - 117 U/L 108 106 131(H)  AST 0 - 37 U/L 18 18 19   ALT 0 - 35 U/L 15 15 14    CBC Latest Ref Rng & Units 12/09/2021 10/07/2021 06/17/2021  WBC 4.0 - 10.5 K/uL 7.5 6.8 7.4  Hemoglobin 12.0 - 15.0 g/dL 11.9(L) 12.1 12.3  Hematocrit 36.0 - 46.0 % 35.8(L) 36.8 36.2  Platelets 150.0 - 400.0 K/uL 227.0 226.0 267   Lipid Panel Recent Labs    06/17/21 1651  CHOL 152  TRIG 76  LDLCALC 71  HDL 66    HEMOGLOBIN A1C Lab Results  Component Value Date   HGBA1C 6.4 10/16/2019   MPG  128 (H) 03/04/2011   TSH Recent Labs    10/07/21 1351 12/09/21 1516  TSH 4.60 1.18    Allergies   Allergies  Allergen Reactions   Fosamax [Alendronate Sodium] Nausea And Vomiting  Penicillins Hives    Did it involve swelling of the face/tongue/throat, SOB, or low BP? No Did it involve sudden or severe rash/hives, skin peeling, or any reaction on the inside of your mouth or nose? Yes Did you need to seek medical attention at a hospital or doctor's office? Yes When did it last happen? Over 10 years ago If all above answers are NO, may proceed with cephalosporin use.     Final Medications at End of Visit     Current Outpatient Medications:    acetaminophen-codeine (TYLENOL #3) 300-30 MG tablet, Take 1 tablet by mouth 2 (two) times daily as needed for moderate pain., Disp: 30 tablet, Rfl: 0   atorvastatin (LIPITOR) 40 MG tablet, TAKE 1 TABLET BY MOUTH EVERY DAY, Disp: 90 tablet, Rfl: 3   benazepril-hydrochlorthiazide (LOTENSIN HCT) 10-12.5 MG tablet, Take 1 tablet by mouth daily., Disp: 90 tablet, Rfl: 3   Calcium Carbonate-Vit D-Min (CALCIUM 1200 PO), Take 1,200 mg by mouth daily., Disp: , Rfl:    Cholecalciferol (VITAMIN D) 50 MCG (2000 UT) tablet, Take 2,000 Units by mouth daily., Disp: , Rfl:    citalopram (CELEXA) 20 MG tablet, TAKE 1 TABLET BY MOUTH EVERY DAY, Disp: 90 tablet, Rfl: 3   clopidogrel (PLAVIX) 75 MG tablet, Take 1 tablet (75 mg total) by mouth daily., Disp: 90 tablet, Rfl: 3   diltiazem (CARDIZEM CD) 180 MG 24 hr capsule, Take 1 capsule (180 mg total) by mouth daily., Disp: 90 capsule, Rfl: 3   ezetimibe (ZETIA) 10 MG tablet, TAKE 1 TABLET (10 MG TOTAL) BY MOUTH DAILY AFTER SUPPER., Disp: 90 tablet, Rfl: 3   acitretin (SORIATANE) 25 MG capsule, Take 1 capsule by mouth daily., Disp: , Rfl:    clobetasol ointment (TEMOVATE) 0.05 %, Apply topically at bedtime., Disp: , Rfl:    fluticasone-salmeterol (ADVAIR DISKUS) 250-50 MCG/ACT AEPB, Inhale 1 puff into the lungs  in the morning and at bedtime., Disp: 60 each, Rfl: 3   levothyroxine (SYNTHROID) 88 MCG tablet, TAKE 1 TABLET BY MOUTH DAILY BEFORE BREAKFAST., Disp: 90 tablet, Rfl: 0   methocarbamol (ROBAXIN) 500 MG tablet, Take 1 tablet (500 mg total) by mouth every 8 (eight) hours as needed., Disp: 30 tablet, Rfl: 0   metoprolol succinate (TOPROL-XL) 25 MG 24 hr tablet, Take 1 tablet (25 mg total) by mouth daily., Disp: 90 tablet, Rfl: 3   montelukast (SINGULAIR) 10 MG tablet, TAKE 1 TABLET BY MOUTH EVERY DAY, Disp: 90 tablet, Rfl: 3   Multiple Vitamin (MULTIVITAMIN WITH MINERALS) TABS tablet, Take 1 tablet by mouth daily., Disp: , Rfl:    pantoprazole (PROTONIX) 40 MG tablet, Take 1 tablet (40 mg total) by mouth 2 (two) times daily., Disp: 180 tablet, Rfl: 3   rOPINIRole (REQUIP) 0.5 MG tablet, Take 1 tablet (0.5 mg total) by mouth at bedtime., Disp: 90 tablet, Rfl: 3   spironolactone (ALDACTONE) 25 MG tablet, TAKE 1 TABLET BY MOUTH EVERY DAY IN THE MORNING, Disp: 90 tablet, Rfl: 1   triamcinolone cream (KENALOG) 0.1 %, APPLY TO AFFECTED AREA TWICE A DAY, Disp: 454 g, Rfl: 0   zolpidem (AMBIEN) 10 MG tablet, Take 1 tablet (10 mg total) by mouth at bedtime as needed. for sleep, Disp: 90 tablet, Rfl: 0    Radiology:   No results found.  Cardiac Studies:   PERIPHERAL VASC BALLOON ANGIOPLASTY/ABD AORTOGRAM W/LOWER EXTREMITY 08/27/2020:  Rt SFA prox 70% diffuse, mid 95% focal stenosis Left SFA mid 20% stenosis Bilateral internal iliac ostial  50% stenosis, followed by small aneurysmal areas Three vessel runoff below the knee b/l Successful PTCA right SFA using chocolate balloon 5.0X120 mm 0% residual stenosis  Carotid artery duplex 05/16/2021: Stenosis in the right internal carotid artery (50-69%). Stenosis in the right external carotid artery (<50%). Right ICA could not be visualized due to patient discomfort. Antegrade right vertebral artery flow. H/O left carotid endarterectomy, patent by study   dated 04/22/2020, there is also mild progression of disease severity in the right ICA from <50%. Follow up in six months is appropriate if clinically indicated.  Lower Extremity Arterial Duplex 07/16/2021: RIght mid, distal SFA and proximal popliteal artery appears occluded with collateral flow. Left distal SFA appears to be occluded.   This exam reveals mildly decreased perfusion of the right lower extremity, noted at the anterior tibial and post tibial artery level (ABI 0.9) and mildly decreased perfusion of the left lower extremity, noted at the post tibial artery level (ABI 0.9). No significant change from 04/22/2020.  PCV MYOCARDIAL PERFUSION WITH LEXISCAN 01/05/2022  Narrative Lexiscan Nuclear stress test 01/06/2021: Nondiagnostic ECG stress due to pharmacologic stress.  7/10 chest pian without EKG changes. Myocardial perfusion is normal. TID is mildly abnormal at 1.25. Overall LV systolic function is normal without regional wall motion abnormalities. Stress LV EF: 69%. Compared to previous study: 2019/06/26, Chest pain with Lexiscan infusion and TID now noted previously not present. Intermediate risk study.  PCV ECHOCARDIOGRAM COMPLETE 01/05/2022  Narrative Echocardiogram 01/05/2022: Left ventricle cavity is normal in size and wall thickness. Normal global wall motion. Normal LV systolic function with EF 55%. Normal diastolic filling pattern. Calculated EF 55%. Trileaflet aortic valve with mild aortic valve leaflet calcification. No significant stenosis or regurgitation. Mild mitral valve leaflet calcification. Mild to moderate mitral regurgitation. Normal right atrial pressure.     EKG:   EKG 01/30/2022: Normal sinus rhythm heart rate of 90 bpm, left atrial enlargement, incomplete right bundle branch block.  No evidence of ischemia.  Assessment     ICD-10-CM   1. Claudication in peripheral vascular disease (HCC)  I73.9     2. Palpitations  R00.2 diltiazem (CARDIZEM CD) 180  MG 24 hr capsule    3. Asymptomatic bilateral carotid artery stenosis  I65.23     4. Primary hypertension  I10 EKG 12-Lead    diltiazem (CARDIZEM CD) 180 MG 24 hr capsule    5. Hypercholesteremia  E78.00       Meds ordered this encounter  Medications   diltiazem (CARDIZEM CD) 180 MG 24 hr capsule    Sig: Take 1 capsule (180 mg total) by mouth daily.    Dispense:  90 capsule    Refill:  3    Discontinue Amlodipine     Medications Discontinued During This Encounter  Medication Reason   amLODipine (NORVASC) 5 MG tablet Change in therapy   aspirin EC 81 MG tablet Completed Course    Recommendations:   Clarity Ciszek Bogusz  is a  71 y.o. AA female  with history of left carotid endarterectomy due to symptomatic carotid stenosis in 2012 with TIA, asymptomatic right carotid stenosis, PAD with history of right SFA angioplasty, bronchial asthma, hypertension, hyperlipidemia, hyperglycemia.  She is still chewing tobacco but has reduced and is planning to quit soon.   She has not had any further chest pain episodes.  I reviewed the results of the stress test and echocardiogram with the patient and reassured her.  Clinically stress test is low risk.  Again discussed  regarding complete abstinence from tobacco chewing.  She is presently using Nicorette gum.  Importance of smoking cessation and risk of progression of peripheral arterial disease discussed.  Presently her symptoms of claudication are stable.  As cerebrovascular symptoms are stable, she is presently on dual antiplatelet therapy, will discontinue aspirin.  For palpitations and hypertension, will discontinue amlodipine and switch her to diltiazem CD 180 mg daily.  She needs surveillance duplex for carotid artery stenosis.  Blood pressure is well controlled.  Lipids are at goal.     Adrian Prows, MD, [facility] 01/31/2022, 7:57 AM Office: [phone] Fax: [phone] Pager: [phone]

## 2022-01-31 ENCOUNTER — Encounter: Payer: Self-pay | Admitting: Cardiology

## 2022-02-03 ENCOUNTER — Other Ambulatory Visit: Payer: Self-pay | Admitting: Cardiology

## 2022-02-03 DIAGNOSIS — E7800 Pure hypercholesterolemia, unspecified: Secondary | ICD-10-CM

## 2022-02-09 DIAGNOSIS — Z79899 Other long term (current) drug therapy: Secondary | ICD-10-CM | POA: Diagnosis not present

## 2022-02-09 DIAGNOSIS — L403 Pustulosis palmaris et plantaris: Secondary | ICD-10-CM | POA: Diagnosis not present

## 2022-02-09 DIAGNOSIS — L400 Psoriasis vulgaris: Secondary | ICD-10-CM | POA: Diagnosis not present

## 2022-02-10 ENCOUNTER — Ambulatory Visit (INDEPENDENT_AMBULATORY_CARE_PROVIDER_SITE_OTHER): Payer: Medicare PPO | Admitting: Psychology

## 2022-02-10 DIAGNOSIS — F330 Major depressive disorder, recurrent, mild: Secondary | ICD-10-CM | POA: Diagnosis not present

## 2022-02-10 NOTE — Progress Notes (Signed)
Progress Note  Start: 02/10/2022 12:00 PM End: 02/10/2022 12:55 PM Diagnosis 307.89 (Other pain disorders related to psychological factors)  33.0 Major Depressive Disorder, recurrent, mild   Symptoms Complains of generalized pain in many joints, muscles, and bones that debilitates normal functioning. (Status: maintained) -- No Description Entered  Decrease or loss of appetite. (Status: maintained) -- No Description Entered  Depressed or irritable mood. (Status: maintained) -- No Description Entered  Exhibits signs and symptoms of depression. (Status: maintained) -- No Description Entered  Experiences an increase in general physical discomfort (e.g., fatigue, night sweats, insomnia, muscle tension, body aches). (Status: maintained) -- No Description Entered  Experiences back or neck pain, interstitial cystitis, or diabetic neuropathy. (Status: maintained) -- No Description Entered  Experiences intermittent pain such as that related to rheumatoid arthritis or irritable bowel syndrome. (Status: maintained) -- No Description Entered  Lack of energy. (Status: maintained) -- No Description Entered  Sleeplessness or hypersomnia. (Status: maintained) -- No Description Entered  Unresolved grief issues. (Status: maintained) -- No Description Entered  Medication Status compliance  Safety none  If Suicidal or Homicidal State Action Taken: unspecified  Current Risk: low Medications unspecified Objectives Related Problem: Accept the chronic pain and move on with life as much as possible. Description: Incorporate physical exercise into daily routine. Target Date: 2022-01-28 Frequency: Biweekly Modality: individual Progress: 10%  Related Problem: Accept the chronic pain and move on with life as much as possible. Description: Integrate and implement all new mental, somatic, and behavioral ways of managing pain. Target Date: 2022-01-28 Frequency: Biweekly Modality: individual Progress:  10%  Related Problem: Accept the chronic pain and move on with life as much as possible. Description: Engage in positive self-talk as an alternative to the depressing, negative thoughts about self and the world. Target Date: 2022-01-28 Frequency: Biweekly Modality: individual Progress: 20%  Related Problem: Accept the chronic pain and move on with life as much as possible. Description: Learn and implement specific coping skills as well as when and how to use them to manage pain and its consequences. Target Date: 2022-01-28 Frequency: Biweekly Modality: individual Progress: 30%  Related Problem: Accept the chronic pain and move on with life as much as possible. Description: Learn and implement calming skills such as relaxation, biofeedback, or mindfulness meditation to ease pain. Target Date: 2022-01-28 Frequency: Biweekly Modality: individual Progress: 10%  Related Problem: Accept the chronic pain and move on with life as much as possible. Description: Identify and monitor specific pain triggers. Target Date: 2022-01-28 Frequency: Biweekly Modality: individual Progress: 30%  Related Problem: Accept the chronic pain and move on with life as much as possible. Description: Increase the level and range of activity by identifying and engaging in values-consistent pleasurable activities. Target Date: 2022-01-28 Frequency: Biweekly Modality: individual Progress: 20%  Related Problem: Accept the chronic pain and move on with life as much as possible. Description: Implement relapse prevention strategies for managing future challenges. Target Date: 2022-01-28 Frequency: Biweekly Modality: individual Progress: 0%  Related Problem: Develop healthy thinking patterns and beliefs about self, others, and the world that lead to the alleviation and help prevent the relapse of depression. Description: Learn and implement problem-solving and decision-making skills. Target Date:  2022-01-28 Frequency: Biweekly Modality: individual Progress: 30%  Related Problem: Develop healthy thinking patterns and beliefs about self, others, and the world that lead to the alleviation and help prevent the relapse of depression. Description: Identify important people in life, past and present, and describe the quality, good and poor, of those  relationships. Target Date: 2022-01-28 Frequency: Biweekly Modality: individual Progress: 30%  Related Problem: Develop healthy thinking patterns and beliefs about self, others, and the world that lead to the alleviation and help prevent the relapse of depression. Description: Increasingly verbalize hopeful and positive statements regarding self, others, and the future. Target Date: 2022-01-28 Frequency: Biweekly Modality: individual Progress: 40%  Related Problem: Develop healthy thinking patterns and beliefs about self, others, and the world that lead to the alleviation and help prevent the relapse of depression. Description: Verbalize insight into how past relationships may be influencing current experiences with depression. Target Date: 2022-01-28 Frequency: Biweekly Modality: individual Progress: 30%  Related Problem: Develop healthy thinking patterns and beliefs about self, others, and the world that lead to the alleviation and help prevent the relapse of depression. Description: Verbalize an understanding of healthy and unhealthy emotions with the intent of increasing the use of healthy emotions to guide actions. Target Date: 2022-01-28 Frequency: Biweekly Modality: individual Progress: 20%  Related Problem: Develop healthy thinking patterns and beliefs about self, others, and the world that lead to the alleviation and help prevent the relapse of depression. Description: Learn and implement conflict resolution skills to resolve interpersonal problems. Target Date: 2022-01-28 Frequency: Biweekly Modality: individual Progress:  20%  Client Response full compliance  Service Location Location, [address] [place], [place] [address]  Service Code cpt [phone]) Identify/label emotions  Self care activities  Validate/empathize  Facilitate problem solving Life style changes  Assess/facilitate readiness to change Motivational interviewing  Supportive Psychotherapy, Grief and Loss Work   Comments  Today I met with Lynley Olarte in a remote video (WebEx) face to face individual psychotherapy an accommodation during the [place] pandemic.   Distance Site: Client's Home  Originating Site: Therapist's Remote Office  Consent: Obtained verbal consent to transmit session remotely    Smrithi reports that her hands are much better.  She is now able to use then and according to her, "look like hands again."  However, one of the medications side effects is depression.  She noted feeling a bit "moody" but knowing what's going on has helped.  Kallen mainly used the session to d/ some difficulties she is having with her daughter.  We d/e/p what seems to be happening in their relationship, problems with communication and lingering resentments from the past.  Anajah states that she's started to walk for exercise.  However, the last time she went out, she walked with her son and she was too sore to do it again.  She agreed that they walked "too far."  We d/ knowing what her limits are and to take it slowly at first.   _______________________________ [employer] List: Go to [place] [employer], [place] and touch a few "pretty fish" ________________________________  Home Practice: continue to execute new morning routine Progress: Patient's mood seems improved aeb less tearful or overwhelmed when talking about her mother, increased desire to plan for the future, anticipating the future in a positive way, participating in yoga class and anticipating social activities with friends she has isolated from .    Royetta Crochet, PhD

## 2022-02-11 IMAGING — MG MM DIGITAL SCREENING BILAT W/ TOMO AND CAD
6 of 10 series · 6 of 30 positions shown · non-contrast
Comparison: Previous exam(s).

CLINICAL DATA: Screening.

EXAM:
DIGITAL SCREENING BILATERAL MAMMOGRAM WITH TOMOSYNTHESIS AND CAD
TECHNIQUE: Bilateral screening digital craniocaudal and mediolateral oblique
mammograms were obtained. Bilateral screening digital breast
tomosynthesis was performed. The images were evaluated with
computer-aided detection.

[R MLO synth-2D]
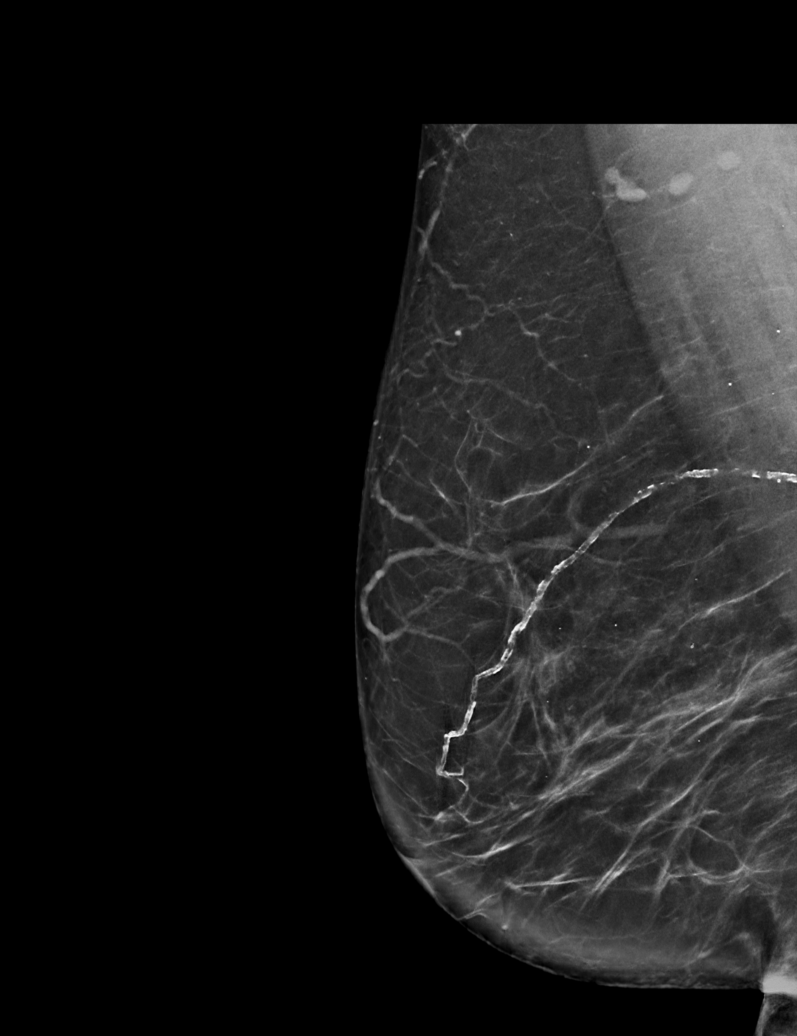

[R CC synth-2D (1 of 2)]
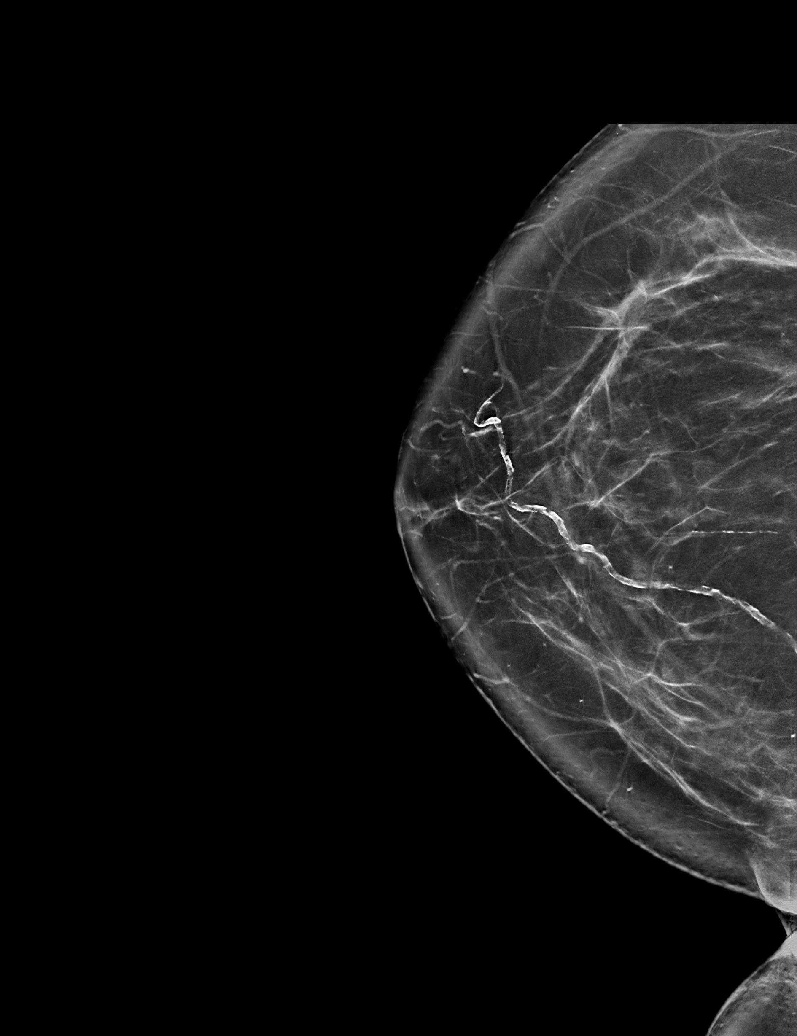

[L MLO synth-2D]
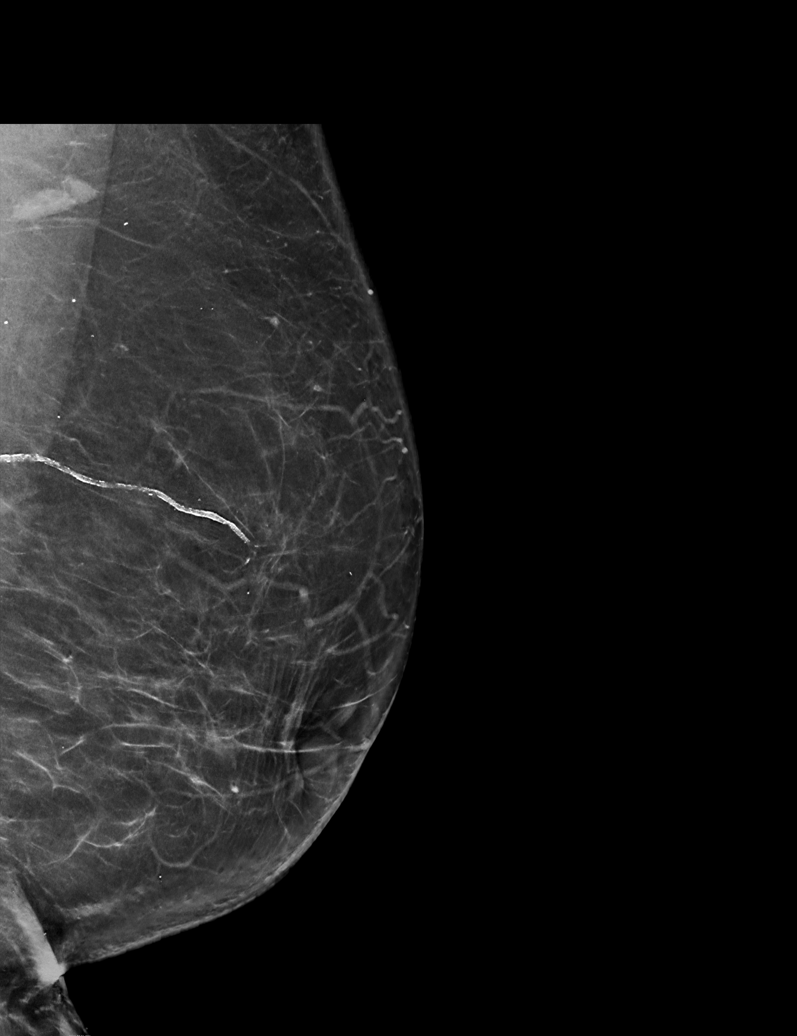

[R CC synth-2D (2 of 2)]
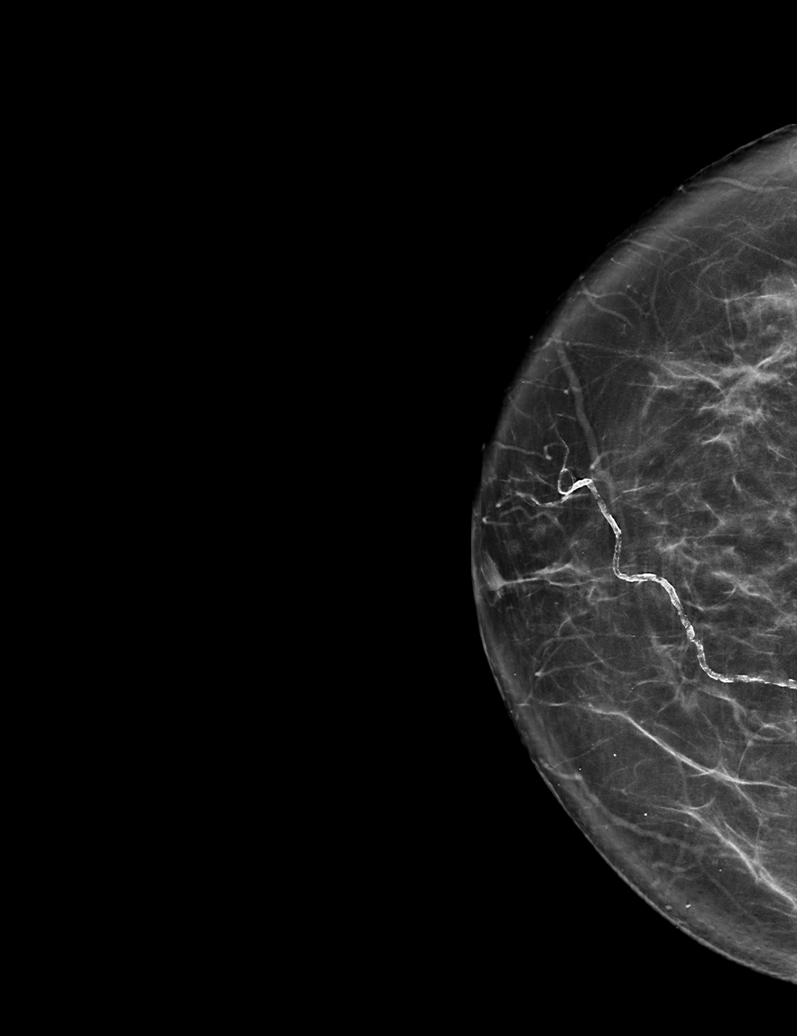

[L CC synth-2D]
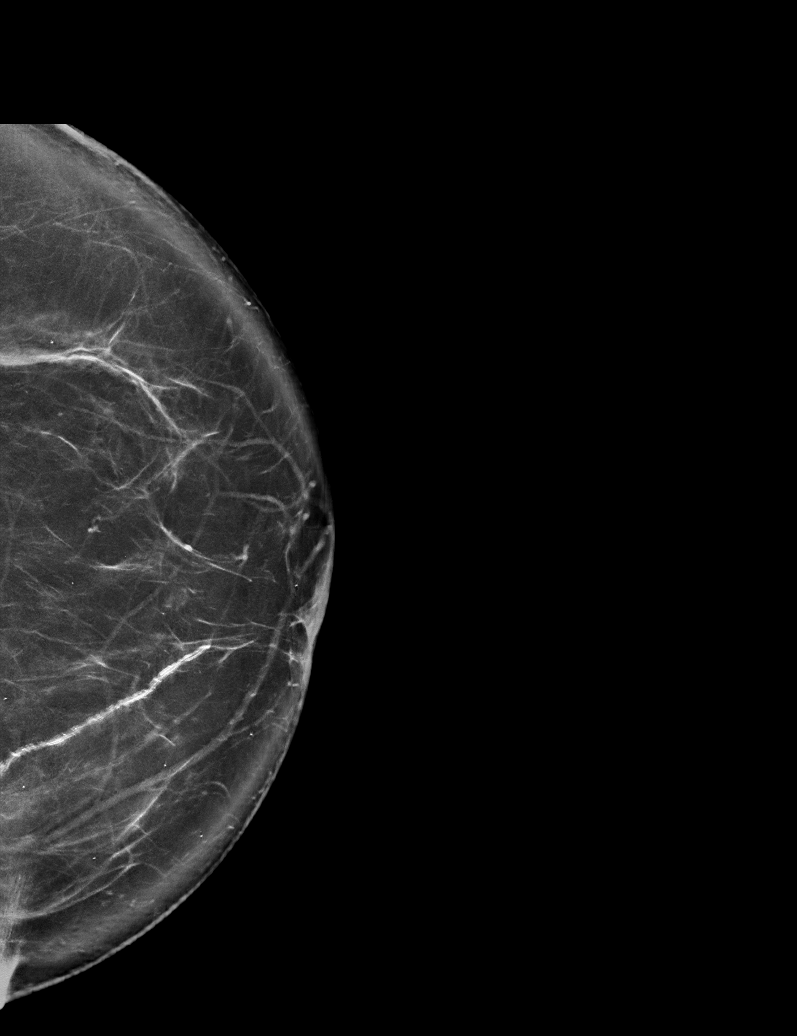

[R CC tomo · tomo slice 38/75.0]
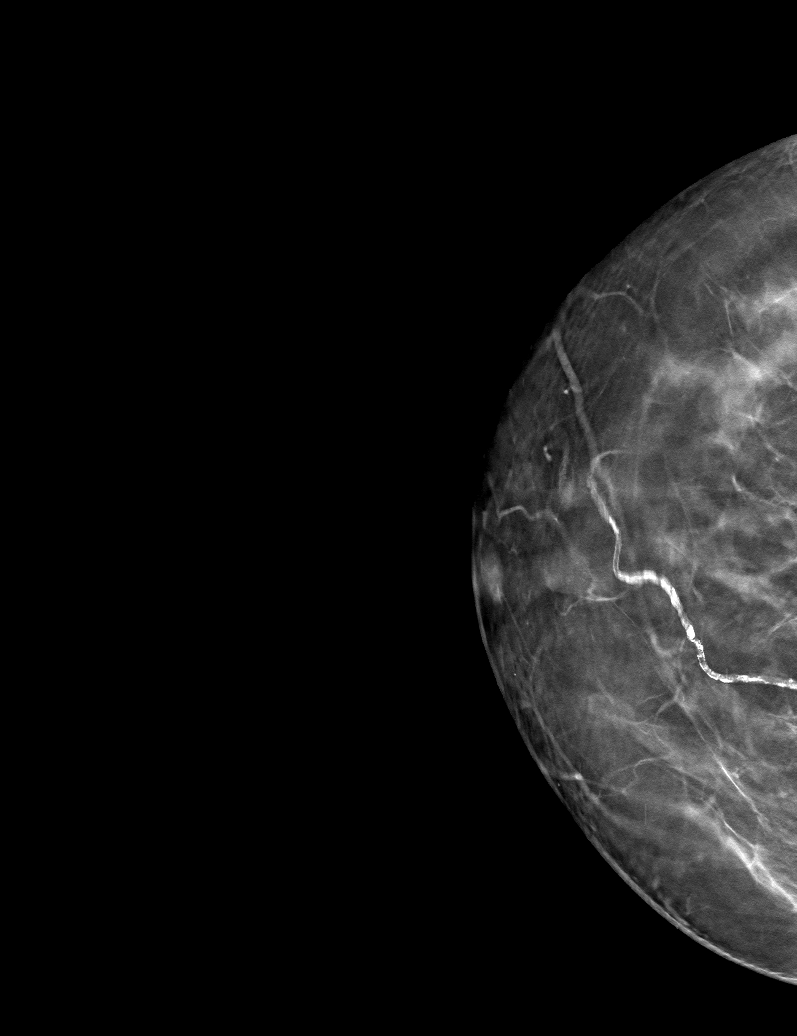

[6 of 30 positions shown; findings below may reference images not displayed]

ACR Breast Density Category b: There are scattered areas of
fibroglandular density.
FINDINGS: There are no findings suspicious for malignancy.
IMPRESSION: No mammographic evidence of malignancy. A result letter of this
screening mammogram will be mailed directly to the patient.

RECOMMENDATION:
Screening mammogram in one year. (Code:51-O-LD2)

BI-RADS CATEGORY  1: Negative.

## 2022-02-14 ENCOUNTER — Other Ambulatory Visit: Payer: Self-pay | Admitting: Family

## 2022-02-17 ENCOUNTER — Other Ambulatory Visit: Payer: Medicare PPO

## 2022-02-24 ENCOUNTER — Ambulatory Visit (INDEPENDENT_AMBULATORY_CARE_PROVIDER_SITE_OTHER): Payer: Medicare PPO | Admitting: Psychology

## 2022-02-24 DIAGNOSIS — F330 Major depressive disorder, recurrent, mild: Secondary | ICD-10-CM

## 2022-02-24 NOTE — Progress Notes (Addendum)
PROGRESS NOTE:  Patient Name: [name] [name]  Date of Birth: 1951/06/05  MRN: [id number]  Date of Service: 02/24/2022 Start: 12:00 PM End:  12:55 PM  Diagnosis 307.89 (Other pain disorders related to psychological factors)  33.0 Major Depressive Disorder, recurrent, mild   Symptoms Complains of generalized pain in many joints, muscles, and bones that debilitates normal functioning. (Status: maintained) --   Decrease or loss of appetite.   Depressed or irritable mood.   Exhibits signs and symptoms of depression.   Experiences an increase in general physical discomfort (e.g., fatigue, night sweats, insomnia, muscle tension, body aches). (Status: maintained)  Experiences back or neck pain, interstitial cystitis, or diabetic neuropathy. (Status: maintained)   Experiences intermittent pain such as that related to rheumatoid arthritis or irritable bowel syndrome. (Status: maintained) -- Lack of energy. (Status: maintained)  Sleeplessness or hypersomnia. (Status: maintained) Unresolved grief issues. (Status: maintained)  Medication Status compliance  Safety none  If Suicidal or Homicidal State Action Taken: unspecified  Current Risk: low Medications unspecified Objectives Related Problem: Accept the chronic pain and move on with life as much as possible. Description: Incorporate physical exercise into daily routine. Target Date: 2023-03-25 Frequency: Biweekly Modality: individual Progress: 10%  Related Problem: Accept the chronic pain and move on with life as much as possible. Description: Integrate and implement all new mental, somatic, and behavioral ways of managing pain. Target Date: 2023-03-25 Frequency: Biweekly Modality: individual Progress: 10%  Related Problem: Accept the chronic pain and move on with life as much as possible. Description: Engage in positive self-talk as an alternative to the depressing, negative thoughts about self and the world. Target Date:  2023-03-25 Frequency: Biweekly Modality: individual Progress: 20%  Related Problem: Accept the chronic pain and move on with life as much as possible. Description: Learn and implement specific coping skills as well as when and how to use them to manage pain and its consequences. Target Date: 2023-03-25 Frequency: Biweekly Modality: individual Progress: 30%  Related Problem: Accept the chronic pain and move on with life as much as possible. Description: Learn and implement calming skills such as relaxation, biofeedback, or mindfulness meditation to ease pain. Target Date: 2023-03-25 Frequency: Biweekly Modality: individual Progress: 10%  Related Problem: Accept the chronic pain and move on with life as much as possible. Description: Identify and monitor specific pain triggers. Target Date: 2023-03-25 Frequency: Biweekly Modality: individual Progress: 30%  Related Problem: Accept the chronic pain and move on with life as much as possible. Description: Increase the level and range of activity by identifying and engaging in values-consistent pleasurable activities. Target Date: 2023-03-25 Frequency: Biweekly Modality: individual Progress: 20%  Related Problem: Accept the chronic pain and move on with life as much as possible. Description: Implement relapse prevention strategies for managing future challenges. Target Date: 2023-03-25 Frequency: Biweekly Modality: individual Progress: 0%  Related Problem: Develop healthy thinking patterns and beliefs about self, others, and the world that lead to the alleviation and help prevent the relapse of depression. Description: Learn and implement problem-solving and decision-making skills. Target Date: 2023-03-25 Frequency: Biweekly Modality: individual Progress: 30%  Related Problem: Develop healthy thinking patterns and beliefs about self, others, and the world that lead to the alleviation and help prevent the relapse of  depression. Description: Identify important people in life, past and present, and describe the quality, good and poor, of those relationships. Target Date: 2023-03-25 Frequency: Biweekly Modality: individual Progress: 30%  Related Problem: Develop healthy thinking patterns and beliefs about self, others, and the  world that lead to the alleviation and help prevent the relapse of depression. Description: Increasingly verbalize hopeful and positive statements regarding self, others, and the future. Target Date: 2023-03-25 Frequency: Biweekly Modality: individual Progress: 40%  Related Problem: Develop healthy thinking patterns and beliefs about self, others, and the world that lead to the alleviation and help prevent the relapse of depression. Description: Verbalize insight into how past relationships may be influencing current experiences with depression. Target Date: 2023-03-25 Frequency: Biweekly Modality: individual Progress: 30%  Related Problem: Develop healthy thinking patterns and beliefs about self, others, and the world that lead to the alleviation and help prevent the relapse of depression. Description: Verbalize an understanding of healthy and unhealthy emotions with the intent of increasing the use of healthy emotions to guide actions. Target Date: 2023-03-25 Frequency: Biweekly Modality: individual Progress: 20%  Related Problem: Develop healthy thinking patterns and beliefs about self, others, and the world that lead to the alleviation and help prevent the relapse of depression. Description: Learn and implement conflict resolution skills to resolve interpersonal problems. Target Date: 2023-03-25 Frequency: Biweekly Modality: individual Progress: 20%  Client Response full compliance  Service Location Location, [address] [place], [place] [address]  Service Code cpt [phone]) Identify/label emotions  Self care activities  Validate/empathize  Facilitate  problem solving Life style changes  Assess/facilitate readiness to change Motivational interviewing  Supportive Psychotherapy, Grief and Loss Work   Comments  Today I met with [name] [name] in a remote video (WebEx) face to face individual psychotherapy an accommodation during the [place] pandemic.   Distance Site: Client's Home  Originating Site: Therapist's Remote Office  Consent: Obtained verbal consent to transmit session remotely    [name] [name] reports that her daughter sold her house in just two weeks.  She offered that if she needed a place to stay she could stay with her if necessary.  [name] [name] stated that they don't get along well, but she also worried about her having a place to stay with her son. We d/e/p the nature of their relationship, family dynamics and the nature of memory.  [name] [name] talked about freshing-up her house for the Spring and it has lifted her mood.  Since the weather is getting better, I encouraged her to get out for a walk.  _______________________________ Bucket List: Go to [place], [place], [place], Snorkel and touch a few "?opretty fish" ________________________________  Home Practice: continue to execute new morning routine Progress: Patient's mood seems improved aeb less tearful or overwhelmed when talking about her mother, increased desire to plan for the future, anticipating the future in a positive way, participating in yoga class and anticipating social activities with friend?Ts she has isolated from .    [name] Crochet, PhD

## 2022-03-04 ENCOUNTER — Other Ambulatory Visit: Payer: Self-pay

## 2022-03-04 ENCOUNTER — Ambulatory Visit: Payer: Medicare PPO

## 2022-03-04 DIAGNOSIS — I6523 Occlusion and stenosis of bilateral carotid arteries: Secondary | ICD-10-CM | POA: Diagnosis not present

## 2022-03-04 DIAGNOSIS — Z9889 Other specified postprocedural states: Secondary | ICD-10-CM

## 2022-03-05 DIAGNOSIS — H209 Unspecified iridocyclitis: Secondary | ICD-10-CM | POA: Diagnosis not present

## 2022-03-10 ENCOUNTER — Ambulatory Visit (INDEPENDENT_AMBULATORY_CARE_PROVIDER_SITE_OTHER): Payer: Medicare PPO | Admitting: Psychology

## 2022-03-10 ENCOUNTER — Ambulatory Visit: Payer: Medicare PPO | Admitting: Family

## 2022-03-10 ENCOUNTER — Encounter: Payer: Self-pay | Admitting: Family

## 2022-03-10 VITALS — BP 100/60 | HR 66 | Temp 98.4°F | Ht 59.0 in | Wt 155.6 lb

## 2022-03-10 DIAGNOSIS — K219 Gastro-esophageal reflux disease without esophagitis: Secondary | ICD-10-CM

## 2022-03-10 DIAGNOSIS — M48061 Spinal stenosis, lumbar region without neurogenic claudication: Secondary | ICD-10-CM | POA: Diagnosis not present

## 2022-03-10 DIAGNOSIS — G4700 Insomnia, unspecified: Secondary | ICD-10-CM

## 2022-03-10 DIAGNOSIS — M791 Myalgia, unspecified site: Secondary | ICD-10-CM | POA: Diagnosis not present

## 2022-03-10 DIAGNOSIS — I739 Peripheral vascular disease, unspecified: Secondary | ICD-10-CM

## 2022-03-10 DIAGNOSIS — F330 Major depressive disorder, recurrent, mild: Secondary | ICD-10-CM

## 2022-03-10 MED ORDER — PANTOPRAZOLE SODIUM 40 MG PO TBEC: 40.0000 mg | DELAYED_RELEASE_TABLET | Freq: Two times a day (BID) | ORAL | 3 refills | Status: DC

## 2022-03-10 MED ORDER — ZOLPIDEM TARTRATE 10 MG PO TABS: 10.0000 mg | ORAL_TABLET | Freq: Every evening | ORAL | 1 refills | Status: DC | PRN

## 2022-03-10 MED ORDER — ACETAMINOPHEN-CODEINE #3 300-30 MG PO TABS: 1.0000 | ORAL_TABLET | Freq: Two times a day (BID) | ORAL | 0 refills | Status: DC | PRN

## 2022-03-10 MED ORDER — METHOCARBAMOL 500 MG PO TABS: 500.0000 mg | ORAL_TABLET | Freq: Three times a day (TID) | ORAL | 2 refills | Status: DC | PRN

## 2022-03-10 NOTE — Progress Notes (Addendum)
PROGRESS NOTE:  Patient Name: [name] [name]  Date of Birth: 1951/05/24  MRN: [id number]  PCP: Marrian Salvage, [place]   Date of Service: 03/10/2022 Start: 12:00 PM End:  12:55 PM  Diagnosis 307.89 (Other pain disorders related to psychological factors)  33.0 Major Depressive Disorder, recurrent, mild   Symptoms Complains of generalized pain in many joints, muscles, and bones that debilitates normal functioning. (Status: maintained)   Decrease or loss of appetite. (Status: maintained)   Depressed or irritable mood. (Status: maintained)   Exhibits signs and symptoms of depression. (Status: maintained)   Experiences an increase in general physical discomfort (e.g., fatigue, night sweats, insomnia, muscle tension, body aches). (Status: maintained)   Experiences back or neck pain, interstitial cystitis, or diabetic neuropathy. (Status: maintained)   Experiences intermittent pain such as that related to rheumatoid arthritis or irritable bowel syndrome. (Status: maintained) -- No Description Entered  Lack of energy. (Status: maintained)  Sleeplessness or hypersomnia. (Status: maintained) Unresolved grief issues. (Status: maintained)  Medication Status compliance  Safety none  If Suicidal or Homicidal State Action Taken: unspecified  Current Risk: low Medications unspecified Objectives Related Problem: Accept the chronic pain and move on with life as much as possible. Description: Incorporate physical exercise into daily routine. Target Date:  2023-03-25 Frequency: Biweekly Modality: individual Progress: 10%  Related Problem: Accept the chronic pain and move on with life as much as possible. Description: Integrate and implement all new mental, somatic, and behavioral ways of managing pain. Target Date:  2023-03-25 Frequency: Biweekly Modality: individual Progress: 10%  Related Problem: Accept the chronic pain and move on with life as much as possible. Description:  Engage in positive self-talk as an alternative to the depressing, negative thoughts about self and the world. Target Date:  2023-03-25  Frequency: Biweekly Modality: individual Progress: 20%  Related Problem: Accept the chronic pain and move on with life as much as possible. Description: Learn and implement specific coping skills as well as when and how to use them to manage pain and its consequences. Target Date:  2023-03-25 Frequency: Biweekly Modality: individual Progress: 30%  Related Problem: Accept the chronic pain and move on with life as much as possible. Description: Learn and implement calming skills such as relaxation, biofeedback, or mindfulness meditation to ease pain. Target Date:  2023-03-25 Frequency: Biweekly Modality: individual Progress: 10%  Related Problem: Accept the chronic pain and move on with life as much as possible. Description: Identify and monitor specific pain triggers. Target Date:  2023-03-25 Frequency: Biweekly Modality: individual Progress: 30%  Related Problem: Accept the chronic pain and move on with life as much as possible. Description: Increase the level and range of activity by identifying and engaging in values-consistent pleasurable activities. Target Date:  2023-03-25 Frequency: Biweekly Modality: individual Progress: 20%  Related Problem: Accept the chronic pain and move on with life as much as possible. Description: Implement relapse prevention strategies for managing future challenges. Target Date: 2023-03-25 Frequency: Biweekly Modality: individual Progress: 0%  Related Problem: Develop healthy thinking patterns and beliefs about self, others, and the world that lead to the alleviation and help prevent the relapse of depression. Description: Learn and implement problem-solving and decision-making skills. Target Date:  2023-03-25 Frequency: Biweekly Modality: individual Progress: 30%  Related Problem: Develop healthy  thinking patterns and beliefs about self, others, and the world that lead to the alleviation and help prevent the relapse of depression. Description: Identify important people in life, past and present, and describe the quality, good and poor,  of those relationships. Target Date:  2023-03-25 Frequency: Biweekly Modality: individual Progress: 30%  Related Problem: Develop healthy thinking patterns and beliefs about self, others, and the world that lead to the alleviation and help prevent the relapse of depression. Description: Increasingly verbalize hopeful and positive statements regarding self, others, and the future. Target Date:  2023-03-25 Frequency: Biweekly Modality: individual Progress: 40%  Related Problem: Develop healthy thinking patterns and beliefs about self, others, and the world that lead to the alleviation and help prevent the relapse of depression. Description: Verbalize insight into how past relationships may be influencing current experiences with depression. Target Date:  2023-03-25 Frequency: Biweekly Modality: individual Progress: 30%  Related Problem: Develop healthy thinking patterns and beliefs about self, others, and the world that lead to the alleviation and help prevent the relapse of depression. Description: Verbalize an understanding of healthy and unhealthy emotions with the intent of increasing the use of healthy emotions to guide actions. Target Date:  2023-03-25 Frequency: Biweekly Modality: individual Progress: 20%  Related Problem: Develop healthy thinking patterns and beliefs about self, others, and the world that lead to the alleviation and help prevent the relapse of depression. Description: Learn and implement conflict resolution skills to resolve interpersonal problems. Target Date:  2023-03-25 Frequency: Biweekly Modality: individual Progress: 20%  Client Response full compliance  Service Location Location, [address]  [place], [place] [address]  Service Code cpt [phone]) Identify/label emotions  Self care activities  Validate/empathize  Facilitate problem solving Life style changes  Assess/facilitate readiness to change Motivational interviewing  Supportive Psychotherapy, Grief and Loss Work   Comments  Today I met with [name] [name] in a remote video (WebEx) face to face individual psychotherapy an accommodation during the [place] pandemic.   Distance Site: Client's Home  Originating Site: Therapist's Remote Office  Consent: Obtained verbal consent to transmit session remotely    [name] [name] reports that her daughter sold her house and asked if she could live with her for two months.  She told her daughter she could.  I encouraged her to clearly communicate with her about her expectations.  We d/e what she felt was important enough to clarify (ie., no babysitting, notify her when she is staying over night at her boyfriends).    [name] [name] states that her back pain has become a problem as it is now keeping her from doing things.  The other day the pain was so bad she couldn't get out of bed and she couldn't get to the phone.  I strongly encouraged her to create a space where she could keep her phone or her alert device in reach especially at bedtime.  [name] [name] also agreed to keep a list with notes and questions for her doctor's visit later today.   _______________________________ Bucket List: Go to [place], [place], [place], Snorkel and touch a few "?opretty fish" ________________________________  Home Practice: continue to execute new morning routine Progress: Patient's mood seems improved aeb less tearful or overwhelmed when talking about her mother, increased desire to plan for the future, anticipating the future in a positive way, participating in yoga class and anticipating social activities with friend?Ts she has isolated from .   Royetta Crochet, PhD

## 2022-03-10 NOTE — Progress Notes (Signed)
?[name] [name] is a 71 y.o. female with the following history as recorded in EpicCare:  ?Patient Active Problem List  ? Diagnosis Date Noted  ? Claudication in peripheral vascular disease ([place]) 08/26/2020  ? Neurogenic claudication ([place]) 07/10/2020  ? Degenerative lumbar spinal stenosis 05/16/2020  ? Sacroiliac pain 08/10/2019  ? Anxiety and depression 05/11/2019  ? Chest pain 05/09/2019  ? Arthritis of sacroiliac joint of both sides 05/01/2019  ? Polyarthralgia 12/28/2018  ? Preventative health care 10/20/2018  ? RLS (restless legs syndrome) 09/27/2018  ? Insomnia disorder related to known organic factor 06/27/2018  ? PLMD (periodic limb movement disorder) 06/27/2018  ? Hot flashes due to menopause 04/25/2018  ? Palpitations 04/25/2018  ? Diaphoresis 04/25/2018  ? Long term current use of antithrombotics/antiplatelets 04/19/2018  ? Insomnia 04/19/2018  ? Intractable episodic headache 02/28/2018  ? Numbness and tingling of right arm 02/28/2018  ? Toe pain, right 10/19/2017  ? Rotator cuff arthropathy of left shoulder 06/02/2017  ? Trigger thumb of left hand 06/02/2017  ? Lipoma 05/06/2017  ? Pain of left thumb 05/06/2017  ? Chronic pain syndrome 12/15/2016  ? Lumbar degenerative disc disease 12/15/2016  ? Left leg pain 01/28/2016  ? Left shoulder pain 04/09/2015  ? Impaired glucose tolerance 07/30/2013  ? Abnormal breath sounds 07/30/2013  ? Asymptomatic bilateral carotid artery stenosis 05/05/2012  ? Anxiety 04/03/2011  ? CVA (cerebral infarction) 03/15/2011  ? HLD (hyperlipidemia) 11/03/2010  ? Hx of adenomatous colonic polyps 02/09/2008  ? Hypothyroidism 11/06/2007  ? ALLERGIC RHINITIS 11/06/2007  ? OSTEOPOROSIS 11/06/2007  ? ALCOHOL ABUSE, HX OF 11/06/2007  ? Essential hypertension 09/13/2007  ? Asthma 09/13/2007  ? PERIMENOPAUSAL STATUS 09/13/2007  ? Chronic insomnia 09/13/2007  ?  ?Current Outpatient Medications  ?Medication Sig Dispense Refill  ? acitretin (SORIATANE) 25 MG capsule Take 1 capsule by mouth  daily.    ? atorvastatin (LIPITOR) 40 MG tablet TAKE 1 TABLET BY MOUTH EVERY DAY 90 tablet 3  ? benazepril-hydrochlorthiazide (LOTENSIN HCT) 10-12.5 MG tablet Take 1 tablet by mouth daily. 90 tablet 3  ? Calcium Carbonate-Vit D-Min (CALCIUM 1200 PO) Take 1,200 mg by mouth daily.    ? Cholecalciferol (VITAMIN D) 50 MCG (2000 UT) tablet Take 2,000 Units by mouth daily.    ? citalopram (CELEXA) 20 MG tablet TAKE 1 TABLET BY MOUTH EVERY DAY 90 tablet 3  ? clopidogrel (PLAVIX) 75 MG tablet Take 1 tablet (75 mg total) by mouth daily. 90 tablet 3  ? diltiazem (CARDIZEM CD) 180 MG 24 hr capsule Take 1 capsule (180 mg total) by mouth daily. 90 capsule 3  ? ezetimibe (ZETIA) 10 MG tablet TAKE 1 TABLET (10 MG TOTAL) BY MOUTH DAILY AFTER SUPPER. 90 tablet 3  ? fluticasone-salmeterol (ADVAIR DISKUS) 250-50 MCG/ACT AEPB Inhale 1 puff into the lungs in the morning and at bedtime. 60 each 3  ? levothyroxine (SYNTHROID) 88 MCG tablet TAKE 1 TABLET BY MOUTH DAILY BEFORE BREAKFAST. 90 tablet 0  ? metoprolol succinate (TOPROL-XL) 25 MG 24 hr tablet Take 1 tablet (25 mg total) by mouth daily. 90 tablet 3  ? montelukast (SINGULAIR) 10 MG tablet TAKE 1 TABLET BY MOUTH EVERY DAY 90 tablet 3  ? Multiple Vitamin (MULTIVITAMIN WITH MINERALS) TABS tablet Take 1 tablet by mouth daily.    ? rOPINIRole (REQUIP) 0.5 MG tablet Take 1 tablet (0.5 mg total) by mouth at bedtime. 90 tablet 3  ? spironolactone (ALDACTONE) 25 MG tablet TAKE 1 TABLET BY MOUTH EVERY DAY IN  THE MORNING 90 tablet 1  ? acetaminophen-codeine (TYLENOL #3) 300-30 MG tablet Take 1 tablet by mouth 2 (two) times daily as needed for moderate pain. 30 tablet 0  ? methocarbamol (ROBAXIN) 500 MG tablet Take 1 tablet (500 mg total) by mouth every 8 (eight) hours as needed. 30 tablet 2  ? pantoprazole (PROTONIX) 40 MG tablet Take 1 tablet (40 mg total) by mouth 2 (two) times daily. 180 tablet 3  ? zolpidem (AMBIEN) 10 MG tablet Take 1 tablet (10 mg total) by mouth at bedtime as needed.  for sleep 90 tablet 1  ? ?No current facility-administered medications for this visit.  ?  ?Allergies: Fosamax [alendronate sodium] and Penicillins  ?Past Medical History:  ?Diagnosis Date  ? Acid reflux   ? ALCOHOL ABUSE, HX OF 11/06/2007  ? ALLERGIC RHINITIS 11/06/2007  ? Anxiety 04/03/2011  ? ASTHMA 09/13/2007  ? Asthma   ? Chronic pain syndrome 12/15/2016  ? COLONIC POLYPS, HX OF 02/09/2008  ?  ADENOMATOUS POLYP  ? Encounter for well adult exam without abnormal findings 04/03/2011  ? HYPERLIPIDEMIA 11/03/2010  ? HYPERTENSION 09/13/2007  ? HYPOTHYROIDISM 11/06/2007  ? Impaired glucose tolerance 07/30/2013  ? INSOMNIA, HX OF 09/13/2007  ? Lumbar degenerative disc disease 12/15/2016  ? OSTEOPOROSIS 11/06/2007  ? PERIMENOPAUSAL STATUS 09/13/2007  ? RLS (restless legs syndrome)   ? Stroke [facility])   ? TIA (transient ischemic attack) 11/04/2011  ?  ?Past Surgical History:  ?Procedure Laterality Date  ? ABDOMINAL AORTOGRAM W/LOWER EXTREMITY N/A 08/27/2020  ? Procedure: ABDOMINAL AORTOGRAM W/LOWER EXTREMITY;  Surgeon: Nigel Mormon, MD;  Location: [facility];  Service: Cardiovascular;  Laterality: N/A;  ? BREAST EXCISIONAL BIOPSY Left   ? BREAST SURGERY  2008 and 2012  ? x 2 - benign, left side  ? carotid artery surgery Left   ? CESAREAN SECTION    ? x 3  ? COLONOSCOPY  multiple  ? 2010  ? PERIPHERAL VASCULAR BALLOON ANGIOPLASTY  08/27/2020  ? Procedure: PERIPHERAL VASCULAR BALLOON ANGIOPLASTY;  Surgeon: Nigel Mormon, MD;  Location: [facility];  Service: Cardiovascular;;  Right SFA scoring balloon  ? TRANSFORAMINAL LUMBAR INTERBODY FUSION (TLIF) WITH PEDICLE SCREW FIXATION 1 LEVEL Left 07/10/2020  ? Procedure: LEFT-SIDED LUMBAR FOUR-FIVE TRANSFORAMINAL LUMBAR INTERBODY FUSION WITH INSTRUMENTATION AND ALLOGRAFT;  Surgeon: Phylliss Bob, MD;  Location: [facility];  Service: Orthopedics;  Laterality: Left;  ?  ?Family History  ?Problem Relation Age of Onset  ? Hypertension Mother 3  ? Heart disease Mother   ?  Heart failure Mother 26  ? Hypertension Brother   ? Colon cancer Brother 70  ? Dementia Maternal Grandmother   ? Breast cancer Cousin   ? Hyperlipidemia Other   ? Hypertension Other   ? Coronary artery disease Other   ?  ?Social History  ? ?Tobacco Use  ? Smoking status: Former  ?  Packs/day: 0.25  ?  Types: Cigarettes  ?  Quit date: 05/31/1979  ?  Years since quitting: 42.8  ? Smokeless tobacco: Current  ?  Types: Snuff  ? Tobacco comments:  ?  social smoker  ?Substance Use Topics  ? Alcohol use: No  ?  Comment: former alcholic  ?  ?Subjective:  ?Medication review/ refills;  ?Is concerned that Atorvastatin could be causing her increased pain; she is not sure whether pain is related to chronic back issues or cholesterol medication; notes that pain was so severe on Sunday she could barely get out  of her bed;  ? ? ? ?Objective:  ?Vitals:  ? 03/10/22 1415  ?BP: 100/60  ?Pulse: 66  ?Temp: 98.4 ?F (36.9 ?C)  ?TempSrc: Oral  ?SpO2: 96%  ?Weight: 155 lb 9.6 oz (70.6 kg)  ?Height: '4\' 11"'$  (1.499 m)  ?  ?General: Well developed, well nourished, in no acute distress  ?Skin : Warm and dry.  ?Head: Normocephalic and atraumatic  ?Lungs: Respirations unlabored; clear to auscultation bilaterally without wheeze, rales, rhonchi  ?CVS exam: normal rate and regular rhythm.  ?Abdomen: Soft; nontender; nondistended; normoactive bowel sounds; no masses or hepatosplenomegaly  ?Musculoskeletal: No deformities; no active joint inflammation  ?Extremities: No edema, cyanosis, clubbing  ?Vessels: Symmetric bilaterally  ?Neurologic: Alert and oriented; speech intact; face symmetrical; moves all extremities well; CNII-XII intact without focal deficit  ? ?Assessment:  ?1. Myalgia   ?2. Claudication in peripheral vascular disease ([place])   ?3. Degenerative lumbar spinal stenosis   ?4. Gastroesophageal reflux disease, unspecified whether esophagitis present   ?5. Insomnia disorder related to known organic factor   ?  ?Plan:  ?? Related to Atorvastatin;  she will hold for now and we will re-evaluate how she feels in 1-2 weeks; based on appearance in office, am more suspicious that pain is related to her back; check CK level today;  ?Refill updated for Ty

## 2022-03-10 NOTE — Patient Instructions (Signed)
Hold the Atorvastatin for now- let me hear from you in 2 weeks with how you feel; stay on the Zetia ( ezetimibe);  ?

## 2022-03-11 LAB — CBC WITH DIFFERENTIAL/PLATELET
Basophils Absolute: 0.1 10*3/uL (ref 0.0–0.1)
Basophils Relative: 0.8 % (ref 0.0–3.0)
Eosinophils Absolute: 0.2 10*3/uL (ref 0.0–0.7)
Eosinophils Relative: 2.7 % (ref 0.0–5.0)
HCT: 38.3 % (ref 36.0–46.0)
Hemoglobin: 12.5 g/dL (ref 12.0–15.0)
Lymphocytes Relative: 30.3 % (ref 12.0–46.0)
Lymphs Abs: 2.1 10*3/uL (ref 0.7–4.0)
MCHC: 32.6 g/dL (ref 30.0–36.0)
MCV: 93.3 fl (ref 78.0–100.0)
Monocytes Absolute: 0.7 10*3/uL (ref 0.1–1.0)
Monocytes Relative: 9.9 % (ref 3.0–12.0)
Neutro Abs: 4 10*3/uL (ref 1.4–7.7)
Neutrophils Relative %: 56.3 % (ref 43.0–77.0)
Platelets: 223 10*3/uL (ref 150.0–400.0)
RBC: 4.11 Mil/uL (ref 3.87–5.11)
RDW: 14.6 % (ref 11.5–15.5)
WBC: 7 10*3/uL (ref 4.0–10.5)

## 2022-03-11 LAB — COMPREHENSIVE METABOLIC PANEL
ALT: 15 U/L (ref 0–35)
AST: 18 U/L (ref 0–37)
Albumin: 4.2 g/dL (ref 3.5–5.2)
Alkaline Phosphatase: 110 U/L (ref 39–117)
BUN: 21 mg/dL (ref 6–23)
CO2: 27 mEq/L (ref 19–32)
Calcium: 9.8 mg/dL (ref 8.4–10.5)
Chloride: 102 mEq/L (ref 96–112)
Creatinine, Ser: 1.06 mg/dL (ref 0.40–1.20)
GFR: 53.46 mL/min — ABNORMAL LOW (ref >60.00)
Glucose, Bld: 122 mg/dL — ABNORMAL HIGH (ref 70–99)
Potassium: 3.9 mEq/L (ref 3.5–5.1)
Sodium: 137 mEq/L (ref 135–145)
Total Bilirubin: 0.4 mg/dL (ref 0.2–1.2)
Total Protein: 6.7 g/dL (ref 6.0–8.3)

## 2022-03-11 LAB — CK: Total CK: 57 U/L (ref 7–177)

## 2022-03-16 ENCOUNTER — Telehealth: Payer: Self-pay | Admitting: *Deleted

## 2022-03-16 NOTE — Telephone Encounter (Signed)
Patient notified of message below.  She stated that since being off the cholesterol she does not hurt as bad.  She still hurts, but a lot better than what it was.  She is now able to get up and get out of bed instead of roll out. ?

## 2022-03-16 NOTE — Telephone Encounter (Signed)
-----   Message from Marrian Salvage, [place] sent at 03/13/2022 12:03 PM EDT ----- ?Please call and check on her today; based on these labs, I think her back is causing the pain and not the cholesterol medication. How is she feeling though?  ?

## 2022-03-17 DIAGNOSIS — H209 Unspecified iridocyclitis: Secondary | ICD-10-CM | POA: Diagnosis not present

## 2022-03-24 ENCOUNTER — Ambulatory Visit (INDEPENDENT_AMBULATORY_CARE_PROVIDER_SITE_OTHER): Payer: Medicare PPO | Admitting: Psychology

## 2022-03-24 ENCOUNTER — Telehealth: Payer: Self-pay | Admitting: Family

## 2022-03-24 DIAGNOSIS — F330 Major depressive disorder, recurrent, mild: Secondary | ICD-10-CM

## 2022-03-24 NOTE — Telephone Encounter (Signed)
I have called the pt and informed her that Mickel Baas is not going to be in the office at all this week and it is recommended that she gets seen to feel better. She stated that her cold started Sunday and she thinks that her new grand baby gave it to her since he was not feeling well. She stated that she will call back tomorrow if she is not feeling better.  ?

## 2022-03-24 NOTE — Progress Notes (Addendum)
?PROGRESS NOTE: ? ?Patient Name: [name] [name]  ?Date of Birth: 05/01/1951  ?MRN: [id number]  ?PCP: Murray, Laura Woodruff, FNP  ? ?Date of Service: 03/24/2022 ?Start: 12:00 PM ?End:  12:55 PM ? ? ?Today I met with [name] [name] in a remote video (WebEx) face to face individual psychotherapy an accommodation during the COVID pandemic.  ? ?Distance Site: Client's Home  ?Originating Site: Therapist's Remote Office  ?Consent: Obtained verbal consent to transmit session remotely  ? ? ?In session today, I worked with [name] [name] to review treatment progress, d/ goals and update her treatment plan.  [name] [name]  participated in the creation and gave consent to her treatment plan. ? ?Diagnosis ?F 307.89 (Other pain disorders related to psychological factors)  ?F 33.0 Major Depressive Disorder, recurrent, mild ?Z 60.0 Phase of Life Problem ? ? ?Individualized Treatment Plan ?Strengths: verbal, motivated  ?Supports: family, spiritual beliefs  ? ?Goal/Needs for Treatment:  ?In order of importance to patient ?1) Integrate and implement strategies and skills to better manage pain. ?2) Increase the level of activity by identifying and engaging in values-consistent pleasurable activities. ?3) Develop healthy thinking patterns and beliefs  ?  ? ?Client Statement of Needs: I want to be happy and live a fuller life, enjoy being retired  ? ?Treatment Level: Biweekly Individual Outpatient Psychotherapy  ?Symptoms:Complains of generalized pain in many joints, muscles, and bones that debilitates normal functioning. ?Decrease or loss of appetite.  ?Depressed or irritable mood.  ?Exhibits signs and symptoms of depression. ?Experiences an increase in general physical discomfort (e.g., fatigue, insomnia, muscle tension, body aches). Experiences back, and leg pain ?Lack of energy.  ?Sleeplessness or hypersomnia.  ?Unresolved grief issues.   ?Client Treatment Preferences: female therapist  ? ?Healthcare consumer's goal for  treatment: ? ?Psychologist, Dr. Mary Ann Garcia, Ph.D. will support the patient's ability to achieve the goals identified. Cognitive Behavioral Therapy, Dialectical Behavioral Therapy, Motivational Interviewing, reminiscing and other evidenced-based practices will be used to promote progress towards healthy functioning.  ? ?Healthcare consumer, [name] [name] will: Actively participate in therapy, working towards healthy functioning.  ?  ?*Justification for Continuation/Discontinuation of Goal: R=Revised, O=Ongoing, A=Achieved, D=Discontinued ? ?Baseline Target: 01/28/2021 ? ?Goal 1) Integrate and implement new mental, somatic, and behavioral ways of managing pain. ? ?Target Date Goal Was reviewed Status Code Progress towards goal/5 pt Likert rating  ?03/25/2023 03/24/2022          O 2/5 - Pt inconsistent uses mindfulness skills to manage pain  ?     ?     ? ?Baseline Target: 01/28/2021 ? ?Goal 2) Develop healthy thinking patterns and beliefs about self, others, and the world that lead to  ?the alleviation and help prevent the relapse of depression. ? ?Target Date Goal Was reviewed Status Code Progress towards goal/5 pt Likert rating  ?03/25/2023 03/24/2022          O 2/5 - Pt has made little progress in consistently using CBT skills to manage mood states  ?     ?     ? ?Baseline Target: 01/28/2021 ? ?Goal 3) Increase the level and range of activity by identifying and engaging in values-consistent  ?pleasurable activities. ? ?Target Date Goal Was reviewed Status Code Progress towards goal/5 pt Likert rating  ?03/25/2023 03/24/2022           O 1/5 - Pt started out e/ and attending activities other than family functions but then ceased to attend these activities  ?     ?     ? ?  This plan has been reviewed and created by the following participants:  This plan will be reviewed  ?at least every 12 months. ?Date Behavioral Health Clinician Date Guardian/Patient   ?03/24/2022 [name] [name], Ph.D.  03/24/2022 [name] [name]  ?      ?     ?     ?  ? ? ?[name] Crochet, PhD ? ? ?

## 2022-03-24 NOTE — Telephone Encounter (Signed)
FYI to provider: ? ?Also pt wanted to let laura know that she feels better with her muscle fatigue after stopping the Atorvastatin. ?

## 2022-03-24 NOTE — Telephone Encounter (Signed)
Pt states she has a very bad cold and is effecting her asthma. She wanted to know if something could be called in. Offered to make an appt with another provider as Mickel Baas is not available. She declined and hung up. Please advise.  ?

## 2022-03-25 NOTE — Telephone Encounter (Signed)
Patient notified and scheduled 

## 2022-03-26 ENCOUNTER — Encounter: Payer: Self-pay | Admitting: Family Medicine

## 2022-03-26 ENCOUNTER — Telehealth (INDEPENDENT_AMBULATORY_CARE_PROVIDER_SITE_OTHER): Payer: Medicare PPO | Admitting: Family Medicine

## 2022-03-26 VITALS — BP 119/84 | HR 73

## 2022-03-26 DIAGNOSIS — J0140 Acute pansinusitis, unspecified: Secondary | ICD-10-CM | POA: Diagnosis not present

## 2022-03-26 DIAGNOSIS — J4521 Mild intermittent asthma with (acute) exacerbation: Secondary | ICD-10-CM

## 2022-03-26 MED ORDER — AZITHROMYCIN 250 MG PO TABS: ORAL_TABLET | ORAL | 0 refills | Status: DC

## 2022-03-26 MED ORDER — PREDNISONE 20 MG PO TABS: 40.0000 mg | ORAL_TABLET | Freq: Every day | ORAL | 0 refills | Status: AC

## 2022-03-26 NOTE — Progress Notes (Signed)
Virtual Video Visit via MyChart Note ? ?I connected with  [place] on 03/26/22 at 10:20 AM EDT by the video enabled telemedicine application for MyChart, and verified that I am speaking with the correct person using two identifiers. ?  ?I introduced myself as a Designer, jewellery with the practice. We discussed the limitations of evaluation and management by telemedicine and the availability of in person appointments. The patient expressed understanding and agreed to proceed. ? ?Participating parties in this visit include: The patient and the nurse practitioner listed.  ?The patient is: At home ?I am: In the office - [facility] ? ?Subjective:   ? ?CC: sinusitis ? ? ?HPI: [name] [name] is a 71 y.o. year old female presenting today via MyChart today for sinusitis. ? ?Patient reports she has been having cold symptoms since the beginning of last week that have progressively worsened.  She is reporting sore throat, rhinorrhea, cough (productive at times with yellow/brown sputum), chest soreness/tightness (asthma), wheezing, dyspnea on exertion at baseline, headaches, sinus pressure, sore throat, and she reports she has vomited once but thinks is from gagging on her mucus production.  She denies any fevers, chills, chest pain, diarrhea, rashes, fatigue/malaise.  States she babysat a grandchild last week who had a bad cold.  Reports she has plenty of over-the-counter medicines and inhalers, but thinks she may need an antibiotic by now. ? ? ? ? ? ?Past medical history, Surgical history, Family history not pertinant except as noted below, Social history, Allergies, and medications have been entered into the medical record, reviewed, and corrections made.  ? ?Review of Systems:  ?All review of systems negative except what is listed in the HPI ? ? ?Objective:   ? ?General:  ?Speaking clearly in complete sentences. ?Absent shortness of breath noted.   ?Alert and oriented x3.   ?Normal  judgment.  ?Absent acute distress. ? ? ?Impression and Recommendations:   ? ?1. Acute non-recurrent pansinusitis ?2. Mild intermittent asthma with exacerbation ?Sending in [place] and prednisone - she has tolerated both of these well in the past. She declines needing any inhalers at this time. Continue supportive measures including rest, hydration, humidifier use, steam showers, warm compresses to sinuses, warm liquids with lemon and honey, and over-the-counter cough, cold, and analgesics as needed.  Patient aware of signs/symptoms requiring further/urgent evaluation.  ? ?- predniSONE (DELTASONE) 20 MG tablet; Take 2 tablets (40 mg total) by mouth daily with breakfast for 5 days.  Dispense: 10 tablet; Refill: 0 ?- azithromycin (ZITHROMAX Z-PAK) 250 MG tablet; Take 2 tablets (500 mg) on  Day 1,  followed by 1 tablet (250 mg) once daily on Days 2 through 5.  Dispense: 6 tablet; Refill: 0 ? ? ? ?Follow-up if symptoms worsen or fail to improve.  ?  ?I discussed the assessment and treatment plan with the patient. The patient was provided an opportunity to ask questions and all were answered. The patient agreed with the plan and demonstrated an understanding of the instructions. ?  ?The patient was advised to call back or seek an in-person evaluation if the symptoms worsen or if the condition fails to improve as anticipated. ? ?I spent 20 minutes dedicated to the care of this patient on the date of this encounter to include pre-visit chart review of prior notes and results, face-to-face time with the patient, and post-visit ordering of testing as indicated.  ? ?Terrilyn Saver, NP  ? ?

## 2022-03-26 NOTE — Progress Notes (Signed)
Sx started last Sun ?Not taken covid test ?Chest tightness/asthma ?Taken robitussin, mucinex,alkaseltzer with no relief ?

## 2022-03-30 ENCOUNTER — Telehealth: Payer: Self-pay | Admitting: Cardiology

## 2022-03-30 DIAGNOSIS — E7800 Pure hypercholesterolemia, unspecified: Secondary | ICD-10-CM

## 2022-03-30 DIAGNOSIS — I739 Peripheral vascular disease, unspecified: Secondary | ICD-10-CM

## 2022-03-30 NOTE — Telephone Encounter (Signed)
Called pt no answer left vm 

## 2022-03-30 NOTE — Telephone Encounter (Signed)
Pt is requesting a call from [place] as she has questions regarding a current medication  ?

## 2022-03-31 MED ORDER — ROSUVASTATIN CALCIUM 20 MG PO TABS: 20.0000 mg | ORAL_TABLET | Freq: Every day | ORAL | 3 refills | Status: DC

## 2022-03-31 NOTE — Telephone Encounter (Signed)
ICD-10-CM   ?1. Claudication in peripheral vascular disease (HCC)  I73.9 rosuvastatin (CRESTOR) 20 MG tablet  ?  ?2. Hypercholesteremia  E78.00 rosuvastatin (CRESTOR) 20 MG tablet  ?  ? ?Meds ordered this encounter  ?Medications  ? rosuvastatin (CRESTOR) 20 MG tablet  ?  Sig: Take 1 tablet (20 mg total) by mouth daily.  ?  Dispense:  90 tablet  ?  Refill:  3  ?  Discontinue atorvastatin  myalgia and arthralgia  ?  ?Medications Discontinued During This Encounter  ?Medication Reason  ? atorvastatin (LIPITOR) 40 MG tablet Side effect (s)  ?  ?Allergies  ?Allergen Reactions  ? Atorvastatin Other (See Comments)  ?  Arthralgia and myalgia  ? Fosamax [Alendronate Sodium] Nausea And Vomiting  ? Penicillins Hives  ?  Did it involve swelling of the face/tongue/throat, SOB, or low BP? No ?Did it involve sudden or severe rash/hives, skin peeling, or any reaction on the inside of your mouth or nose? Yes ?Did you need to seek medical attention at a hospital or doctor's office? Yes ?When did it last happen? Over 10 years ago ?If all above answers are ?NO?, may proceed with cephalosporin use.  ? ? ?Adrian Prows, MD, [facility] ?03/31/2022, 9:42 PM ?Office: [phone] ?Fax: [phone] ?Pager: [phone]  ?

## 2022-03-31 NOTE — Telephone Encounter (Signed)
Pt called back asking if you could change her Atorvastatin '40mg'$  to something else. Due that she was having muscle aches. Pt mention her PCP told her to stop it for 2 weeks and see how she felt. Pt mention she feels better and now she is able to get out of bed. Please advise

## 2022-04-01 NOTE — Telephone Encounter (Signed)
Called and spoke to pt, pt will be going to pick up crestor and will continue taking the zetia.

## 2022-04-07 ENCOUNTER — Ambulatory Visit: Payer: Medicare PPO | Admitting: Psychology

## 2022-04-07 DIAGNOSIS — F330 Major depressive disorder, recurrent, mild: Secondary | ICD-10-CM

## 2022-04-07 NOTE — Progress Notes (Incomplete)
?PROGRESS NOTE: ? ?Patient Name: [name] [name]  ?Date of Birth: 06/21/1951  ?MRN: [id number]  ?PCP: Marrian Salvage, [place]  ? ?Date of Service: 04/07/2022 ?Start: 12:00 PM ?End:  12:55 PM ? ? ?Today I met with [name] [name] in a remote video (WebEx) face to face individual psychotherapy an accommodation during the [place] pandemic.  ? ?Distance Site: Client's Home  ?Originating Site: Set designer  ?Consent: Obtained verbal consent to transmit session remotely  ? ? ?[name] [name] reports that she was finally feeling better about not feeling better for a long time.   ? ? ? ?Annual Review: 03/25/2023 ? ?Diagnosis ?F 307.89 (Other pain disorders related to psychological factors)  ?F 33.0 Major Depressive Disorder, recurrent, mild ?Z 60.0 Phase of Life Problem ? ? ?Individualized Treatment Plan ?Strengths: verbal, motivated  ?Supports: family, spiritual beliefs  ? ?Goal/Needs for Treatment:  ?In order of importance to patient ?1) Integrate and implement strategies and skills to better manage pain. ?2) Increase the level of activity by identifying and engaging in values-consistent pleasurable activities. ?3) Develop healthy thinking patterns and beliefs  ?  ? ?Client Statement of Needs: I want to be happy and live a fuller life, enjoy being retired  ? ?Treatment Level: Biweekly Individual Outpatient Psychotherapy  ?Symptoms:Complains of generalized pain in many joints, muscles, and bones that debilitates normal functioning. ?Decrease or loss of appetite.  ?Depressed or irritable mood.  ?Exhibits signs and symptoms of depression. ?Experiences an increase in general physical discomfort (e.g., fatigue, insomnia, muscle tension, body aches). Experiences back, and leg pain ?Lack of energy.  ?Sleeplessness or hypersomnia.  ?Unresolved grief issues.   ?Client Treatment Preferences: female therapist  ? ?Healthcare consumer's goal for treatment: ? ?Psychologist, Dr. Royetta Crochet, Ph.D. will support the  patient's ability to achieve the goals identified. Cognitive Behavioral Therapy, Dialectical Behavioral Therapy, Motivational Interviewing, reminiscing and other evidenced-based practices will be used to promote progress towards healthy functioning.  ? ?Healthcare consumer, [place] will: Actively participate in therapy, working towards healthy functioning.  ?  ?*Justification for Continuation/Discontinuation of Goal: R=Revised, O=Ongoing, A=Achieved, D=Discontinued ? ?Goal 1) Integrate and implement all new mental, somatic, and behavioral ways of managing pain. ? ?Target Date Goal Was reviewed Status Code Progress towards goal/5 pt Likert rating  ?03/25/2023 03/24/2022          O 2/5  ?     ?     ? ?Goal 2) Develop healthy thinking patterns and beliefs about self, others, and the world that lead to  ?the alleviation and help prevent the relapse of depression. ? ?Target Date Goal Was reviewed Status Code Progress towards goal/5 pt Likert rating  ?03/25/2023 03/24/2022          O 2/5  ?     ?     ? ?Goal 3) Increase the level and range of activity by identifying and engaging in values-consistent  ?pleasurable activities. ? ?Target Date Goal Was reviewed Status Code Progress towards goal/5 pt Likert rating  ?03/25/2023 03/24/2022           O 1/5  ?     ?     ? ?This plan has been reviewed and created by the following participants:  This plan will be reviewed  ?at least every 12 months. ?Date Behavioral Health Clinician Date Guardian/Patient   ?03/24/2022 Royetta Crochet, Ph.D.  03/24/2022 [name] [name]  ?     ?     ?     ?  ? ? ?  Royetta Crochet, PhD ? ? ?

## 2022-04-14 ENCOUNTER — Other Ambulatory Visit: Payer: Self-pay | Admitting: Family

## 2022-04-21 ENCOUNTER — Ambulatory Visit (INDEPENDENT_AMBULATORY_CARE_PROVIDER_SITE_OTHER): Payer: Medicare PPO | Admitting: Psychology

## 2022-04-21 DIAGNOSIS — F3341 Major depressive disorder, recurrent, in partial remission: Secondary | ICD-10-CM | POA: Diagnosis not present

## 2022-04-21 DIAGNOSIS — L403 Pustulosis palmaris et plantaris: Secondary | ICD-10-CM | POA: Diagnosis not present

## 2022-04-21 DIAGNOSIS — Z79899 Other long term (current) drug therapy: Secondary | ICD-10-CM | POA: Diagnosis not present

## 2022-04-21 NOTE — Progress Notes (Signed)
? ? ? ? ? ? ? ? ? ?PROGRESS NOTE: ? ?Patient Name: [name] [name]  ?Date of Birth: 08/27/1951  ?MRN: [id number]  ?PCP: Marrian Salvage, [place]  ? ?Date of Service: 04/07/2022 ?Start: 12:00 PM ?End:  12:55 PM ? ? ?Today I met with [place] B Syme in a remote video (WebEx) face to face individual psychotherapy an accommodation during the [place] pandemic.  ? ?Distance Site: Client's Home  ?Originating Site: Set designer  ?Consent: Obtained verbal consent to transmit session remotely  ? ? ?Yolanda Lucus reports that she is getting adjusted to having her daughter and grandson living with her.  We d/e how her life has been different.  She admits that she feels less lonely.  Eveleen will be celebrating her 71th birthday in a couple of days.  Her adult children are taking her out for a "fancy" dinner.  She talked about what is has been like to live these 71 years.   ? ? ? ?Annual Review: 03/25/2023 ? ?Diagnosis ?F 307.89 (Other pain disorders related to psychological factors)  ?F 33.0 Major Depressive Disorder, recurrent, mild ?Z 60.0 Phase of Life Problem ? ? ?Individualized Treatment Plan ?Strengths: verbal, motivated  ?Supports: family, spiritual beliefs  ? ?Goal/Needs for Treatment:  ?In order of importance to patient ?1) Integrate and implement strategies and skills to better manage pain. ?2) Increase the level of activity by identifying and engaging in values-consistent pleasurable activities. ?3) Develop healthy thinking patterns and beliefs  ?  ? ?Client Statement of Needs: I want to be happy and live a fuller life, enjoy being retired  ? ?Treatment Level: Biweekly Individual Outpatient Psychotherapy  ?Symptoms:Complains of generalized pain in many joints, muscles, and bones that debilitates normal functioning. ?Decrease or loss of appetite.  ?Depressed or irritable mood.  ?Exhibits signs and symptoms of depression. ?Experiences an increase in general physical discomfort (e.g., fatigue, insomnia,  muscle tension, body aches). Experiences back, and leg pain ?Lack of energy.  ?Sleeplessness or hypersomnia.  ?Unresolved grief issues.   ?Client Treatment Preferences: female therapist  ? ?Healthcare consumer's goal for treatment: ? ?Psychologist, Dr. Royetta Crochet, Ph.D. will support the patient's ability to achieve the goals identified. Cognitive Behavioral Therapy, Dialectical Behavioral Therapy, Motivational Interviewing, reminiscing and other evidenced-based practices will be used to promote progress towards healthy functioning.  ? ?Healthcare consumer, [place] will: Actively participate in therapy, working towards healthy functioning.  ?  ?*Justification for Continuation/Discontinuation of Goal: R=Revised, O=Ongoing, A=Achieved, D=Discontinued ? ?Goal 1) Integrate and implement all new mental, somatic, and behavioral ways of managing pain. ? ?Target Date Goal Was reviewed Status Code Progress towards goal/5 pt Likert rating  ?03/25/2023 03/24/2022          O 2/5  ?     ?     ? ?Goal 2) Develop healthy thinking patterns and beliefs about self, others, and the world that lead to  ?the alleviation and help prevent the relapse of depression. ? ?Target Date Goal Was reviewed Status Code Progress towards goal/5 pt Likert rating  ?03/25/2023 03/24/2022          O 2/5  ?     ?     ? ?Goal 3) Increase the level and range of activity by identifying and engaging in values-consistent  ?pleasurable activities. ? ?Target Date Goal Was reviewed Status Code Progress towards goal/5 pt Likert rating  ?03/25/2023 03/24/2022           O 1/5  ?     ?     ? ?  This plan has been reviewed and created by the following participants:  This plan will be reviewed  ?at least every 12 months. ?Date Behavioral Health Clinician Date Guardian/Patient   ?03/24/2022 Royetta Crochet, Ph.D.  03/24/2022 Curly Shores Alvira  ?     ?     ?     ?  ? ? ?Royetta Crochet, PhD ? ?

## 2022-05-05 ENCOUNTER — Ambulatory Visit (INDEPENDENT_AMBULATORY_CARE_PROVIDER_SITE_OTHER): Payer: Medicare PPO | Admitting: Psychology

## 2022-05-05 DIAGNOSIS — F3341 Major depressive disorder, recurrent, in partial remission: Secondary | ICD-10-CM | POA: Diagnosis not present

## 2022-05-05 IMAGING — DX DG CHEST 2V
2 series · 2 of 2 positions shown · non-contrast
Comparison: None.

CLINICAL DATA: atypical chest pain

EXAM:
CHEST - 2 VIEW

[chest pa]
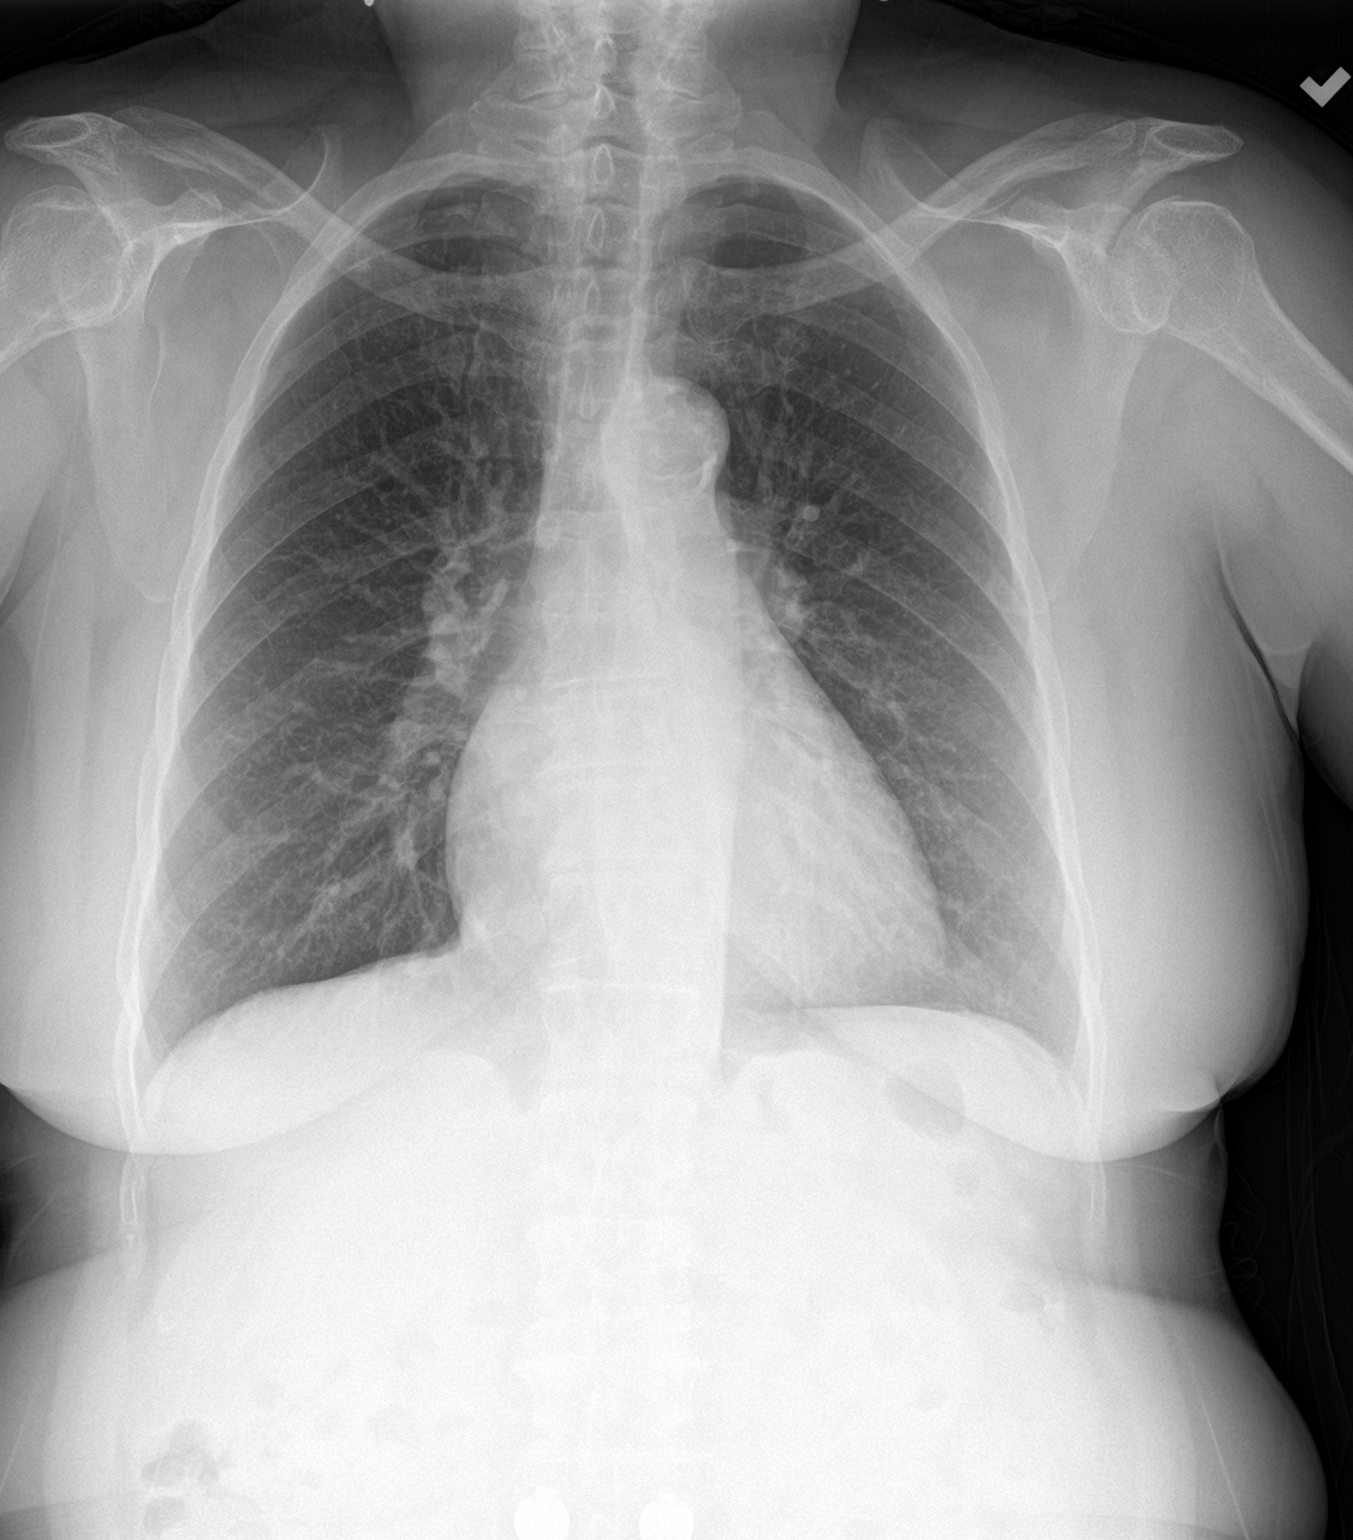

[chest lat]
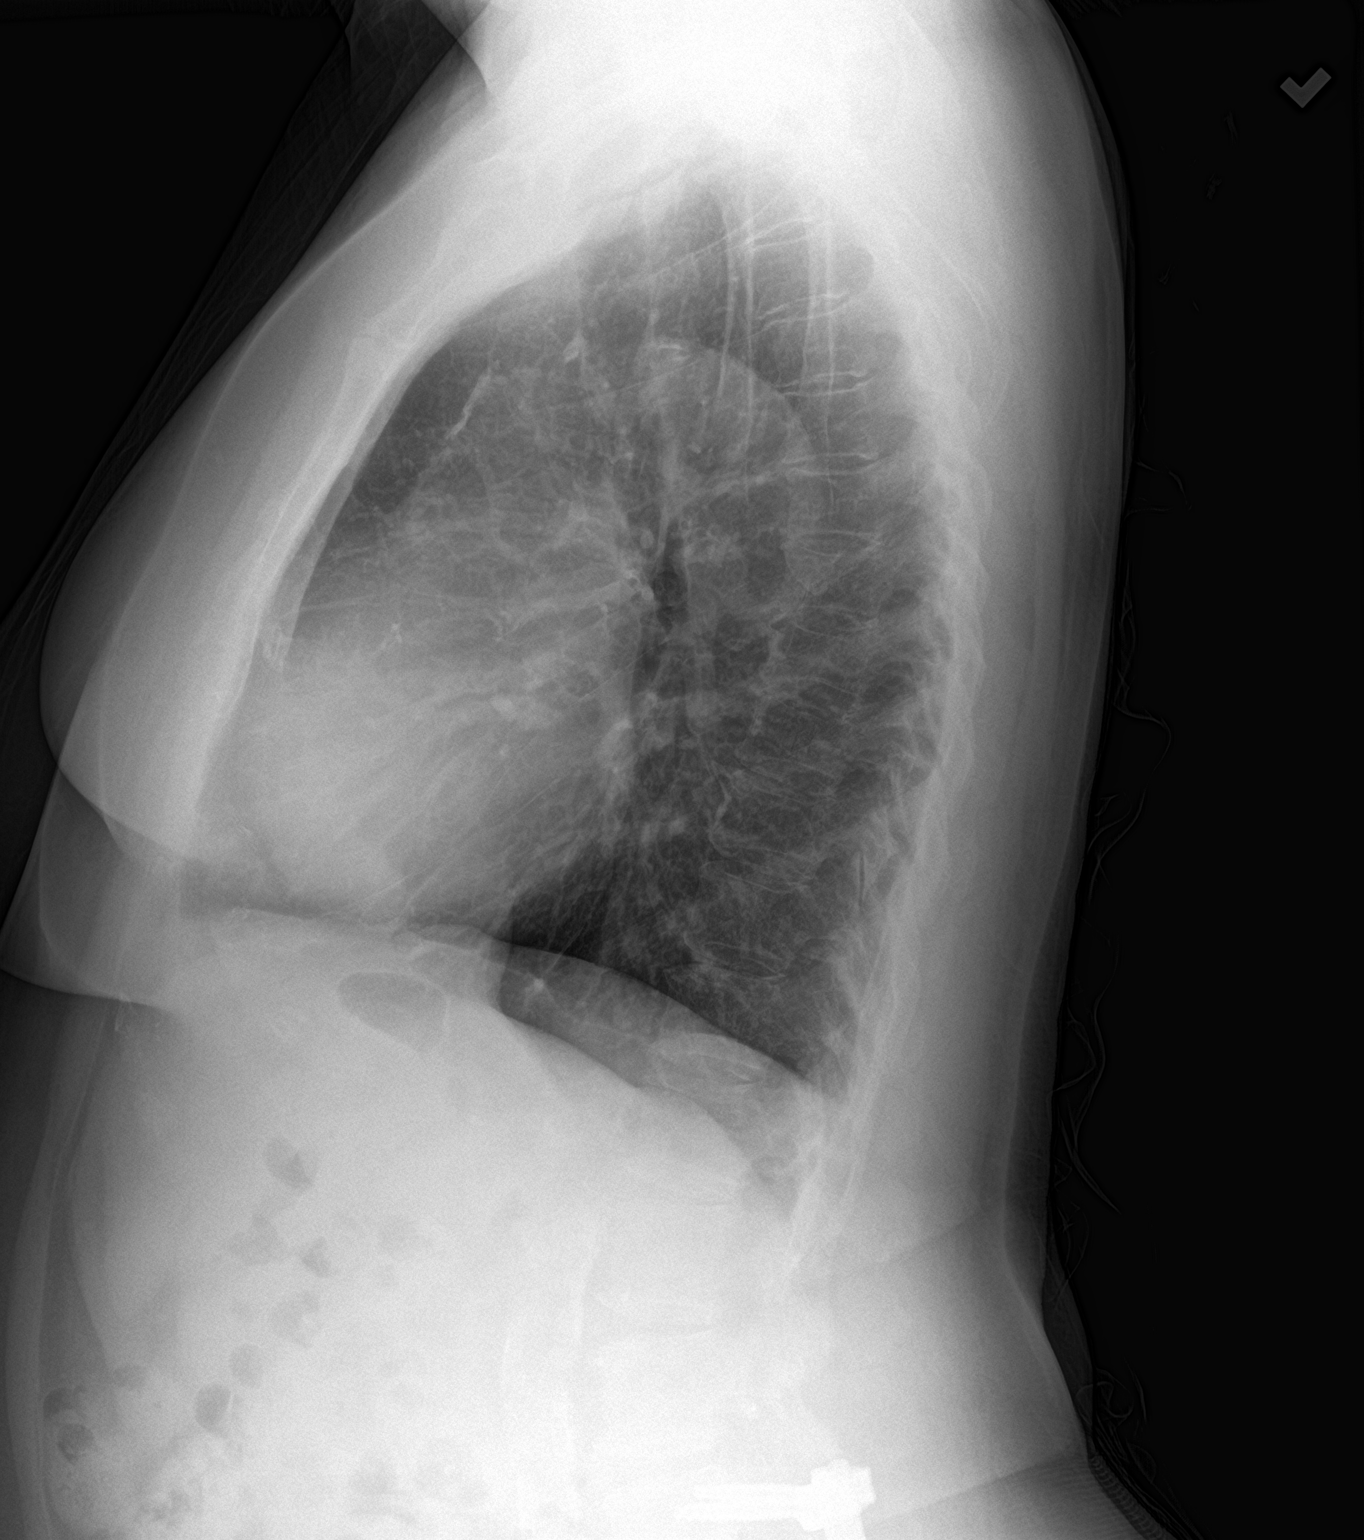

[2 of 2 positions shown; findings below may reference images not displayed]

FINDINGS: The cardiomediastinal silhouette is within normal limits. No pleural
effusion. No pneumothorax. No mass or consolidation. No acute
osseous abnormality. Atherosclerotic calcifications of the aortic
arch.
IMPRESSION: No acute findings in the chest.

Atherosclerotic calcifications of the aorta.

## 2022-05-05 NOTE — Progress Notes (Signed)
PROGRESS NOTE:  Patient Name: [name] [name]  Date of Birth: [birth year]  MRN: [id number]  PCP: Marrian Salvage, [place]   Date of Service: 05/07/2022 Start: 12:00 PM End:  12:55 PM   Today I met with [name] [name] in a remote video (WebEx) face to face individual psychotherapy an accommodation during the [place] pandemic.   Distance Site: Client's Home  Originating Site: Therapist's Remote Office  Consent: Obtained verbal consent to transmit session remotely    Annual Review: 03/25/2023  Diagnosis F 307.89 (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 2) Increase the level of activity by identifying and engaging in values-consistent pleasurable activities. 3) Develop healthy thinking patterns and beliefs     Client Statement of Needs: I want to be happy and live a fuller life, enjoy being retired   Treatment Level: Biweekly Individual Outpatient Psychotherapy  Symptoms:Complains of generalized pain in many joints, muscles, and bones that debilitates normal functioning. Decrease or loss of appetite.  Depressed or irritable mood.  Exhibits signs and symptoms of depression. Experiences an increase in general physical discomfort (e.g., fatigue, insomnia, muscle tension, body aches). Experiences back, and leg pain Lack of energy.  Sleeplessness or hypersomnia.  Unresolved grief issues.   Client Treatment Preferences: female therapist   Healthcare consumer's goal for treatment:  Psychologist, Dr. Royetta Crochet, Ph.D. will support the patient's ability to achieve the goals identified. Cognitive Behavioral Therapy, Dialectical  Behavioral Therapy, Motivational Interviewing, reminiscing and other evidenced-based practices will be used to promote progress towards healthy functioning.   Healthcare consumer, [name] [name] will: Actively participate in therapy, working towards healthy functioning.    *Justification for Continuation/Discontinuation of Goal: R=Revised, O=Ongoing, A=Achieved, D=Discontinued  Goal 1) Integrate and implement all new mental, somatic, and behavioral ways of managing pain.  Target Date Goal Was reviewed Status Code Progress towards goal/5 pt Likert rating  03/25/2023 03/24/2022          O 2/5             Goal 2) Develop healthy thinking patterns and beliefs about self, others, and the world that lead to  the alleviation and help prevent the relapse of depression.  Target Date Goal Was reviewed Status Code Progress towards goal/5 pt Likert rating  03/25/2023 03/24/2022          O 2/5             Goal 3) Increase the level and range of activity by identifying and engaging in values-consistent  pleasurable activities.  Target Date Goal Was reviewed Status Code Progress towards goal/5 pt Likert rating  03/25/2023 03/24/2022           O 1/5             This plan has been reviewed and created by the following participants:  This plan will be reviewed  at least every 12 months. Date Behavioral Health Clinician Date Guardian/Patient   03/24/2022 Royetta Crochet, Ph.D.  03/24/2022 Curly Shores [place]har                   Shavonne Glander reports that her kids threw her  a very special birthday.  We talked about her birthday and Mother's Day.  May is often a trigger for sadness and grief as she thinks a lot of her mother.  We d/e/p her feelings of grief.  She did acknowledge that her feelings are less intense.  I allowed her to reminisce about her mother and her childhood.     Royetta Crochet, PhD
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
## 2022-05-19 ENCOUNTER — Ambulatory Visit: Payer: Medicare PPO | Admitting: Psychology

## 2022-05-30 ENCOUNTER — Other Ambulatory Visit: Payer: Self-pay | Admitting: Cardiology

## 2022-05-30 DIAGNOSIS — I10 Essential (primary) hypertension: Secondary | ICD-10-CM

## 2022-06-02 ENCOUNTER — Telehealth: Payer: Self-pay

## 2022-06-02 ENCOUNTER — Ambulatory Visit (INDEPENDENT_AMBULATORY_CARE_PROVIDER_SITE_OTHER): Payer: Medicare PPO | Admitting: Psychology

## 2022-06-02 DIAGNOSIS — F3341 Major depressive disorder, recurrent, in partial remission: Secondary | ICD-10-CM | POA: Diagnosis not present

## 2022-06-02 NOTE — Progress Notes (Signed)
PROGRESS NOTE:  Patient Name: [name] [name]  Date of Birth: 01-22-1951  MRN: [id number]  PCP: [name] [name], [place]   Date of Service: 06/02/2022 Start: 12:00 PM End:  12:55 PM   Today I met with [name] [name] in a remote video (WebEx) face to face individual psychotherapy.   Distance Site: Client's Home  Originating Site: Dr. 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 2) Increase the level of activity by identifying and engaging in values-consistent pleasurable activities. 3) Develop healthy thinking patterns and beliefs     Client Statement of Needs: I want to be happy and live a fuller life, enjoy being retired   Treatment Level: Biweekly Individual Outpatient Psychotherapy  Symptoms:Complains of generalized pain in many joints, muscles, and bones that debilitates normal functioning. Decrease or loss of appetite.  Depressed or irritable mood.  Exhibits signs and symptoms of depression. Experiences an increase in general physical discomfort (e.g., fatigue, insomnia, muscle tension, body aches). Experiences back, and leg pain Lack of energy.  Sleeplessness or hypersomnia.  Unresolved grief issues.   Client Treatment Preferences: female therapist   Healthcare consumer's goal for treatment:  Psychologist, Dr. Royetta [name], Ph.D. [name] support the patient's ability to achieve the goals identified. Cognitive Behavioral Therapy, Dialectical Behavioral Therapy, Motivational Interviewing,  reminiscing and other evidenced-based practices [name] be used to promote progress towards healthy functioning.   Healthcare consumer, [name] [name]: Actively participate in therapy, working towards healthy functioning.    *Justification for Continuation/Discontinuation of Goal: R=Revised, O=Ongoing, A=Achieved, D=Discontinued  Goal 1) Integrate and implement all new mental, somatic, and behavioral ways of managing pain.  Target Date Goal Was reviewed Status Code Progress towards goal/5 pt Likert rating  03/25/2023 03/24/2022          O 2/5             Goal 2) Develop healthy thinking patterns and beliefs about self, others, and the world that lead to  the alleviation and help prevent the relapse of depression.  Target Date Goal Was reviewed Status Code Progress towards goal/5 pt Likert rating  03/25/2023 03/24/2022          O 2/5             Goal 3) Increase the level and range of activity by identifying and engaging in values-consistent  pleasurable activities.  Target Date Goal Was reviewed Status Code Progress towards goal/5 pt Likert rating  03/25/2023 03/24/2022           O 1/5             This plan has been reviewed and created by the following participants:  This plan [name] be reviewed  at least every 12 months. Date Behavioral Health Clinician Date Guardian/Patient   03/24/2022 [name] [name], Ph.D.  03/24/2022 [name] [name]                   [name] [name] reports that she had a fall on Sunday.  She fell while sweeping her front porch.  Luckily,  her son was over and he was able to pick her up and carry her to the bed.  We had a d/ about her good fortune in having her son over, but that she needs to be prepared for when she is alone.  [name] [name] admits that she doesn't wear her alert device.  After some d/ she agreed to wear it when she was alone in her house.      [name] Crochet, PhD          [name] Crochet, PhD

## 2022-06-03 ENCOUNTER — Ambulatory Visit (INDEPENDENT_AMBULATORY_CARE_PROVIDER_SITE_OTHER): Payer: Medicare PPO

## 2022-06-03 ENCOUNTER — Telehealth: Payer: Self-pay

## 2022-06-03 DIAGNOSIS — Z0000 Encounter for general adult medical examination without abnormal findings: Secondary | ICD-10-CM | POA: Diagnosis not present

## 2022-06-03 MED ORDER — METHOCARBAMOL 500 MG PO TABS: 500.0000 mg | ORAL_TABLET | Freq: Three times a day (TID) | ORAL | 2 refills | Status: DC | PRN

## 2022-06-03 NOTE — Progress Notes (Cosign Needed)
Subjective:   [name] [name] is a 71 y.o. female who presents for Medicare Annual (Subsequent) preventive examination.  I connected with  [name] [name] on 06/03/22 by a audio enabled telemedicine application and verified that I am speaking with the correct person using two identifiers.  Patient Location: Home  Provider Location: Office/Clinic  I discussed the limitations of evaluation and management by telemedicine. The patient expressed understanding and agreed to proceed.   Review of Systems     Cardiac Risk Factors include: advanced age (>19mn, >>4women);hypertension;dyslipidemia     Objective:    Today's Vitals   06/03/22 1512  PainSc: 6    There is no height or weight on file to calculate BMI.     06/03/2022    3:04 PM 09/23/2020    4:25 PM 08/27/2020    6:01 AM 07/10/2020    6:14 AM 07/04/2020   11:26 AM 05/09/2019   12:18 PM 02/14/2019   11:08 AM  Advanced Directives  Does Patient Have a Medical Advance Directive? No;Yes Yes No Yes Yes No No  Type of [place] Living will  Living will Living will    Does patient want to make changes to medical advance directive?  No - Patient declined  No - Patient declined     Copy of HHavanain Chart? No - copy requested        Would patient like information on creating a medical advance directive? No - Patient declined  No - Patient declined   Yes (ED - Information included in AVS) No - Patient declined    Current Medications (verified) Outpatient Encounter Medications as of 06/03/2022  Medication Sig   acetaminophen-codeine (TYLENOL #3) 300-30 MG tablet Take 1 tablet by mouth 2 (two) times daily as needed for moderate pain.   acitretin (SORIATANE) 25 MG capsule Take 1 capsule by mouth daily.   azithromycin (ZITHROMAX Z-PAK) 250 MG tablet Take 2 tablets (500 mg) on  Day 1,  followed by 1 tablet (250 mg) once daily on Days 2 through 5.    benazepril-hydrochlorthiazide (LOTENSIN HCT) 10-12.5 MG tablet Take 1 tablet by mouth daily.   Calcium Carbonate-Vit D-Min (CALCIUM 1200 PO) Take 1,200 mg by mouth daily.   Cholecalciferol (VITAMIN D) 50 MCG (2000 UT) tablet Take 2,000 Units by mouth daily.   citalopram (CELEXA) 20 MG tablet TAKE 1 TABLET BY MOUTH EVERY DAY   clopidogrel (PLAVIX) 75 MG tablet Take 1 tablet (75 mg total) by mouth daily.   diltiazem (CARDIZEM CD) 180 MG 24 hr capsule Take 1 capsule (180 mg total) by mouth daily.   ezetimibe (ZETIA) 10 MG tablet TAKE 1 TABLET (10 MG TOTAL) BY MOUTH DAILY AFTER SUPPER.   fluticasone-salmeterol (ADVAIR DISKUS) 250-50 MCG/ACT AEPB Inhale 1 puff into the lungs in the morning and at bedtime.   levothyroxine (SYNTHROID) 88 MCG tablet TAKE 1 TABLET BY MOUTH EVERY DAY BEFORE BREAKFAST   metoprolol succinate (TOPROL-XL) 25 MG 24 hr tablet Take 1 tablet (25 mg total) by mouth daily.   montelukast (SINGULAIR) 10 MG tablet TAKE 1 TABLET BY MOUTH EVERY DAY   Multiple Vitamin (MULTIVITAMIN WITH MINERALS) TABS tablet Take 1 tablet by mouth daily.   pantoprazole (PROTONIX) 40 MG tablet Take 1 tablet (40 mg total) by mouth 2 (two) times daily.   rOPINIRole (REQUIP) 0.5 MG tablet Take 1 tablet (0.5 mg total) by mouth at bedtime.   rosuvastatin (CRESTOR) 20 MG  tablet Take 1 tablet (20 mg total) by mouth daily.   spironolactone (ALDACTONE) 25 MG tablet TAKE 1 TABLET BY MOUTH EVERY DAY IN THE MORNING   zolpidem (AMBIEN) 10 MG tablet Take 1 tablet (10 mg total) by mouth at bedtime as needed. for sleep   [DISCONTINUED] methocarbamol (ROBAXIN) 500 MG tablet Take 1 tablet (500 mg total) by mouth every 8 (eight) hours as needed.   methocarbamol (ROBAXIN) 500 MG tablet Take 1 tablet (500 mg total) by mouth every 8 (eight) hours as needed.   No facility-administered encounter medications on file as of 06/03/2022.    Allergies (verified) Atorvastatin, Fosamax [alendronate sodium], and Penicillins    History: Past Medical History:  Diagnosis Date   Acid reflux    ALCOHOL ABUSE, HX OF 11/06/2007   ALLERGIC RHINITIS 11/06/2007   Anxiety 04/03/2011   ASTHMA 09/13/2007   Asthma    Chronic pain syndrome 12/15/2016   COLONIC POLYPS, HX OF 02/09/2008    ADENOMATOUS POLYP   Encounter for well adult exam without abnormal findings 04/03/2011   HYPERLIPIDEMIA 11/03/2010   HYPERTENSION 09/13/2007   HYPOTHYROIDISM 11/06/2007   Impaired glucose tolerance 07/30/2013   INSOMNIA, HX OF 09/13/2007   Lumbar degenerative disc disease 12/15/2016   OSTEOPOROSIS 11/06/2007   PERIMENOPAUSAL STATUS 09/13/2007   RLS (restless legs syndrome)    Stroke [facility])    TIA (transient ischemic attack) 11/04/2011   Past Surgical History:  Procedure Laterality Date   ABDOMINAL AORTOGRAM W/LOWER EXTREMITY N/A 08/27/2020   Procedure: ABDOMINAL AORTOGRAM W/LOWER EXTREMITY;  Surgeon: Nigel Mormon, MD;  Location: [place] [facility];  Service: Cardiovascular;  Laterality: N/A;   BREAST EXCISIONAL BIOPSY Left    BREAST SURGERY  2008 and 2012   x 2 - benign, left side   carotid artery surgery Left    CESAREAN SECTION     x 3   COLONOSCOPY  multiple   2010   PERIPHERAL VASCULAR BALLOON ANGIOPLASTY  08/27/2020   Procedure: PERIPHERAL VASCULAR BALLOON ANGIOPLASTY;  Surgeon: Nigel Mormon, MD;  Location: [place] [facility];  Service: Cardiovascular;;  Right SFA scoring balloon   TRANSFORAMINAL LUMBAR INTERBODY FUSION (TLIF) WITH PEDICLE SCREW FIXATION 1 LEVEL Left 07/10/2020   Procedure: LEFT-SIDED LUMBAR FOUR-FIVE TRANSFORAMINAL LUMBAR INTERBODY FUSION WITH INSTRUMENTATION AND ALLOGRAFT;  Surgeon: Phylliss Bob, MD;  Location: [place];  Service: Orthopedics;  Laterality: Left;   Family History  Problem Relation Age of Onset   Hypertension Mother 59   Heart disease Mother    Heart failure Mother 33   Hypertension Brother    Colon cancer Brother 35   Dementia Maternal Grandmother    Breast cancer Cousin     Hyperlipidemia Other    Hypertension Other    Coronary artery disease Other    Social History   Socioeconomic History   Marital status: Divorced    Spouse name: Not on file   Number of children: 3   Years of education: Not on file   Highest education level: Not on file  Occupational History   Occupation: Consulting civil engineer    Employer: [employer]  Tobacco Use   Smoking status: Former    Packs/day: 0.25    Types: Cigarettes    Quit date: 05/31/1979    Years since quitting: 43.0   Smokeless tobacco: Current    Types: Snuff   Tobacco comments:    social smoker  Vaping Use   Vaping Use: Never used  Substance and Sexual Activity  Alcohol use: No    Comment: former alcholic   Drug use: Yes    Types: Marijuana   Sexual activity: Not on file  Other Topics Concern   Not on file  Social History Narrative   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)   Divorced   3 children   [place] grandchildren for daughter in [place]   No EtOH, tobacco, drugs   Social Determinants of Health   Financial Resource Strain: Low Risk  (09/23/2020)   Overall Financial Resource Strain (CARDIA)    Difficulty of Paying Living Expenses: Not hard at all  Food Insecurity: No Food Insecurity (09/23/2020)   Hunger Vital Sign    Worried About Running Out of Food in the Last Year: Never true    [place] in the Last Year: Never true  Transportation Needs: No Transportation Needs (09/23/2020)   PRAPARE - Hydrologist (Medical): No    Lack of Transportation (Non-Medical): No  Physical Activity: Sufficiently Active (09/23/2020)   Exercise Vital Sign    Days of Exercise per Week: 5 days    Minutes of Exercise per Session: 30 min  Stress: No Stress Concern Present (09/23/2020)   [place]    Feeling of Stress : Not at all  Social Connections: Unknown (09/23/2020)   Social Connection and  Isolation Panel [NHANES]    Frequency of Communication with Friends and Family: More than three times a week    Frequency of Social Gatherings with Friends and Family: More than three times a week    Attends Religious Services: More than 4 times per year    Active Member of [employer] or Organizations: Yes    Attends Music therapist: More than 4 times per year    Marital Status: Patient refused    Tobacco Counseling Ready to quit: Not Answered Counseling given: Not Answered Tobacco comments: social smoker   Clinical Intake:  Pre-visit preparation completed: Yes  Pain : 0-10 Pain Score: 6  Pain Type: Chronic pain Pain Location: Other (Comment) (back and legs) Pain Descriptors / Indicators: Aching, Dull, Sore Pain Onset: More than a month ago Pain Frequency: Intermittent     Nutritional Risks: None Diabetes: No  How often do you need to have someone help you when you read instructions, pamphlets, or other written materials from your doctor or pharmacy?: 1 - Never  Diabetic?no  Interpreter Needed?: No  Information entered by :: Avenel [name] [name]   Activities of Daily Living    06/03/2022    3:16 PM  In your present state of health, do you have any difficulty performing the following activities:  Hearing? 0  Vision? 0  Difficulty concentrating or making decisions? 0  Walking or climbing stairs? 1  Dressing or bathing? 0  Doing errands, shopping? 0  Preparing Food and eating ? N  Using the Toilet? N  In the past six months, have you accidently leaked urine? Y  Do you have problems with loss of bowel control? N  Managing your Medications? N  Managing your Finances? N  Housekeeping or managing your Housekeeping? N    Patient Care Team: Marrian Salvage, FNP as PCP - General (Internal Medicine)  Indicate any recent Medical Services you may have received from other than [facility] providers in the past year (date may be approximate).     Assessment:    This is a routine wellness examination for [name] [name].  Hearing/Vision screen No results found.  Dietary issues and exercise activities discussed: Current Exercise Habits: The patient does not participate in regular exercise at present, Exercise limited by: None identified   Goals Addressed   None    Depression Screen    06/03/2022    3:09 PM 12/09/2021    2:26 PM 10/07/2021    1:05 PM 09/23/2020    2:37 PM 09/13/2020    3:05 PM 04/20/2019    9:45 AM 10/19/2017    3:53 PM  PHQ 2/9 Scores  PHQ - 2 Score 0 0 0 0 0 1 0    Fall Risk    06/03/2022    3:05 PM 12/09/2021    2:26 PM 10/07/2021    1:05 PM 03/12/2021   12:29 PM 09/23/2020    4:25 PM  Fall Risk   Falls in the past year? 1 0 0 0 0  Number falls in past yr: 0 0 0 0 0  Injury with Fall? 0 0 0 0 0  Risk for fall due to : History of fall(s) No Fall Risks No Fall Risks  No Fall Risks  Follow up Falls evaluation completed Falls evaluation completed Falls evaluation completed Falls evaluation completed Falls evaluation completed    [place]:  Any stairs in or around the home? No  If so, are there any without handrails?  N/A Home free of loose throw rugs in walkways, pet beds, electrical cords, etc? Yes  Adequate lighting in your home to reduce risk of falls? Yes   ASSISTIVE DEVICES UTILIZED TO PREVENT FALLS:  Life alert? Yes  Use of a cane, walker or w/c? Yes  Grab bars in the bathroom? No  Shower chair or bench in shower? Yes  Elevated toilet seat or a handicapped toilet? Yes   TIMED UP AND GO:  Was the test performed? No .    Cognitive Function:        06/03/2022    3:20 PM  6CIT Screen  What Year? 0 points  What month? 0 points  What time? 0 points  Count back from 20 0 points  Months in reverse 4 points  Repeat phrase 2 points  Total Score 6 points    Immunizations Immunization History  Administered Date(s) Administered   Fluad Quad(high Dose 65+) 10/16/2019,  09/13/2020, 10/07/2021   Influenza Split 09/13/2012   Influenza Whole 09/29/2006, 10/17/2007, 10/14/2008, 08/14/2009, 09/29/2010   Influenza, High Dose Seasonal PF 09/12/2017, 10/20/2018   Influenza,inj,Quad PF,6+ Mos 10/10/2014   PFIZER(Purple Top)SARS-COV-2 Vaccination 02/11/2020, 02/27/2020   Pneumococcal Conjugate-13 07/03/2015   Pneumococcal Polysaccharide-23 10/14/2008, 10/19/2017   Td 11/01/2009   Zoster Recombinat (Shingrix) 10/30/2021    TDAP status: Due, Education has been provided regarding the importance of this vaccine. Advised may receive this vaccine at local pharmacy or Health Dept. Aware to provide a copy of the vaccination record if obtained from local pharmacy or Health Dept. Verbalized acceptance and understanding.  Flu Vaccine status: Up to date  Pneumococcal vaccine status: Up to date  Covid-19 vaccine status: Declined, Education has been provided regarding the importance of this vaccine but patient still declined. Advised may receive this vaccine at local pharmacy or Health Dept.or vaccine clinic. Aware to provide a copy of the vaccination record if obtained from local pharmacy or Health Dept. Verbalized acceptance and understanding.  Qualifies for Shingles Vaccine? Yes   Zostavax completed No   Shingrix Completed?: No.    Education has been provided  regarding the importance of this vaccine. Patient has been advised to call insurance company to determine out of pocket expense if they have not yet received this vaccine. Advised may also receive vaccine at local pharmacy or Health Dept. Verbalized acceptance and understanding.  Screening Tests Health Maintenance  Topic Date Due   TETANUS/TDAP  11/02/2019   COVID-19 Vaccine (3 - Pfizer series) 04/23/2020   Zoster Vaccines- Shingrix (2 of 2) 12/25/2021   INFLUENZA VACCINE  07/14/2022   COLONOSCOPY (Pts 45-36yr Insurance coverage will need to be confirmed)  07/01/2023   MAMMOGRAM  09/18/2023   Pneumonia Vaccine 70  Years old  Completed   DEXA SCAN  Completed   Hepatitis C Screening  Completed   HPV VACCINES  Aged Out    Health Maintenance  Health Maintenance Due  Topic Date Due   TETANUS/TDAP  11/02/2019   COVID-19 Vaccine (3 - Pfizer series) 04/23/2020   Zoster Vaccines- Shingrix (2 of 2) 12/25/2021    Colorectal cancer screening: Type of screening: Colonoscopy. Completed 06/30/18. Repeat every 5 years  Mammogram status: Completed 09/17/21. Repeat every year  Bone Density status: Ordered will schedule with mammogram. Pt provided with contact info and advised to call to schedule appt.  Lung Cancer Screening: (Low Dose CT Chest recommended if Age 70-80years, 30 pack-year currently smoking OR have quit w/in 15years.) does not qualify.   Lung Cancer Screening Referral: n/a  Additional Screening:  Hepatitis C Screening: does qualify; Completed 12/15/16  Vision Screening: Recommended annual ophthalmology exams for early detection of glaucoma and other disorders of the eye. Is the patient up to date with their annual eye exam?  Yes  Who is the provider or what is the name of the office in which the patient attends annual eye exams? Doesn't remember name If pt is not established with a provider, would they like to be referred to a provider to establish care? No .   Dental Screening: Recommended annual dental exams for proper oral hygiene  Community Resource Referral / Chronic Care Management: CRR required this visit?  No   CCM required this visit?  No      Plan:     I have personally reviewed and noted the following in the patient's chart:   Medical and social history Use of alcohol, tobacco or illicit drugs  Current medications and supplements including opioid prescriptions.  Functional ability and status Nutritional status Physical activity Advanced directives List of other physicians Hospitalizations, surgeries, and ER visits in previous 12 months Vitals Screenings to include  cognitive, depression, and falls Referrals and appointments  In addition, I have reviewed and discussed with patient certain preventive protocols, quality metrics, and best practice recommendations. A written personalized care plan for preventive services as well as general preventive health recommendations were provided to patient.   Due to this being a telephonic visit, the after visit summary with patients personalized plan was offered to patient via mail or my-chart.  Patient would like to access on my-chart.  SDuard BradyChism, [place]  06/03/2022   Nurse Notes: none

## 2022-06-03 NOTE — Patient Instructions (Signed)
[name] [name] , Thank you for taking time to come for your Medicare Wellness Visit. I appreciate your ongoing commitment to your health goals. Please review the following plan we discussed and let me know if I can assist you in the future.   Screening recommendations/referrals: Colonoscopy: 06/30/18 due 07/01/23 Mammogram: 09/17/21 due 09/17/22 Bone Density: will schedule with mammogram  Recommended yearly ophthalmology/optometry visit for glaucoma screening and checkup Recommended yearly dental visit for hygiene and checkup  Vaccinations: Influenza vaccine: up to date Pneumococcal vaccine: up to date Tdap vaccine: Due-May obtain vaccine at your local pharmacy.  Shingles vaccine: Due-May obtain vaccine at your local pharmacy.    Covid-19:declined  Advanced directives: yes, not on file  Conditions/risks identified: see problem list  Next appointment: Follow up in one year for your annual wellness visit    Preventive Care 65 Years and Older, Female Preventive care refers to lifestyle choices and visits with your health care provider that can promote health and wellness. What does preventive care include? A yearly physical exam. This is also called an annual well check. Dental exams once or twice a year. Routine eye exams. Ask your health care provider how often you should have your eyes checked. Personal lifestyle choices, including: Daily care of your teeth and gums. Regular physical activity. Eating a healthy diet. Avoiding tobacco and drug use. Limiting alcohol use. Practicing safe sex. Taking low-dose aspirin every day. Taking vitamin and mineral supplements as recommended by your health care provider. What happens during an annual well check? The services and screenings done by your health care provider during your annual well check will depend on your age, overall health, lifestyle risk factors, and family history of disease. Counseling  Your health care provider may ask you  questions about your: Alcohol use. Tobacco use. Drug use. Emotional well-being. Home and relationship well-being. Sexual activity. Eating habits. History of falls. Memory and ability to understand (cognition). Work and work Statistician. Reproductive health. Screening  You may have the following tests or measurements: Height, weight, and BMI. Blood pressure. Lipid and cholesterol levels. These may be checked every 5 years, or more frequently if you are over 20 years old. Skin check. Lung cancer screening. You may have this screening every year starting at age 31 if you have a 30-pack-year history of smoking and currently smoke or have quit within the past 15 years. Fecal occult blood test (FOBT) of the stool. You may have this test every year starting at age 26. Flexible sigmoidoscopy or colonoscopy. You may have a sigmoidoscopy every 5 years or a colonoscopy every 10 years starting at age 55. Hepatitis C blood test. Hepatitis B blood test. Sexually transmitted disease (STD) testing. Diabetes screening. This is done by checking your blood sugar (glucose) after you have not eaten for a while (fasting). You may have this done every 1-3 years. Bone density scan. This is done to screen for osteoporosis. You may have this done starting at age 29. Mammogram. This may be done every 1-2 years. Talk to your health care provider about how often you should have regular mammograms. Talk with your health care provider about your test results, treatment options, and if necessary, the need for more tests. Vaccines  Your health care provider may recommend certain vaccines, such as: Influenza vaccine. This is recommended every year. Tetanus, diphtheria, and acellular pertussis (Tdap, Td) vaccine. You may need a Td booster every 10 years. Zoster vaccine. You may need this after age 26. Pneumococcal 13-valent conjugate (PCV13)  vaccine. One dose is recommended after age 95. Pneumococcal polysaccharide  (PPSV23) vaccine. One dose is recommended after age 77. Talk to your health care provider about which screenings and vaccines you need and how often you need them. This information is not intended to replace advice given to you by your health care provider. Make sure you discuss any questions you have with your health care provider. Document Released: 12/27/2015 Document Revised: 08/19/2016 Document Reviewed: 10/01/2015 Elsevier Interactive Patient Education  2017 [place] Prevention in the Home Falls can cause injuries. They can happen to people of all ages. There are many things you can do to make your home safe and to help prevent falls. What can I do on the outside of my home? Regularly fix the edges of walkways and driveways and fix any cracks. Remove anything that might make you trip as you walk through a door, such as a raised step or threshold. Trim any bushes or trees on the path to your home. Use bright outdoor lighting. Clear any walking paths of anything that might make someone trip, such as rocks or tools. Regularly check to see if handrails are loose or broken. Make sure that both sides of any steps have handrails. Any raised decks and porches should have guardrails on the edges. Have any leaves, snow, or ice cleared regularly. Use sand or salt on walking paths during winter. Clean up any spills in your garage right away. This includes oil or grease spills. What can I do in the bathroom? Use night lights. Install grab bars by the toilet and in the tub and shower. Do not use towel bars as grab bars. Use non-skid mats or decals in the tub or shower. If you need to sit down in the shower, use a plastic, non-slip stool. Keep the floor dry. Clean up any water that spills on the floor as soon as it happens. Remove soap buildup in the tub or shower regularly. Attach bath mats securely with double-sided non-slip rug tape. Do not have throw rugs and other things on the  floor that can make you trip. What can I do in the bedroom? Use night lights. Make sure that you have a light by your bed that is easy to reach. Do not use any sheets or blankets that are too big for your bed. They should not hang down onto the floor. Have a firm chair that has side arms. You can use this for support while you get dressed. Do not have throw rugs and other things on the floor that can make you trip. What can I do in the kitchen? Clean up any spills right away. Avoid walking on wet floors. Keep items that you use a lot in easy-to-reach places. If you need to reach something above you, use a strong step stool that has a grab bar. Keep electrical cords out of the way. Do not use floor polish or wax that makes floors slippery. If you must use wax, use non-skid floor wax. Do not have throw rugs and other things on the floor that can make you trip. What can I do with my stairs? Do not leave any items on the stairs. Make sure that there are handrails on both sides of the stairs and use them. Fix handrails that are broken or loose. Make sure that handrails are as long as the stairways. Check any carpeting to make sure that it is firmly attached to the stairs. Fix any carpet that is loose  or worn. Avoid having throw rugs at the top or bottom of the stairs. If you do have throw rugs, attach them to the floor with carpet tape. Make sure that you have a light switch at the top of the stairs and the bottom of the stairs. If you do not have them, ask someone to add them for you. What else can I do to help prevent falls? Wear shoes that: Do not have high heels. Have rubber bottoms. Are comfortable and fit you well. Are closed at the toe. Do not wear sandals. If you use a stepladder: Make sure that it is fully opened. Do not climb a closed stepladder. Make sure that both sides of the stepladder are locked into place. Ask someone to hold it for you, if possible. Clearly mark and make  sure that you can see: Any grab bars or handrails. First and last steps. Where the edge of each step is. Use tools that help you move around (mobility aids) if they are needed. These include: Canes. Walkers. Scooters. Crutches. Turn on the lights when you go into a dark area. Replace any light bulbs as soon as they burn out. Set up your furniture so you have a clear path. Avoid moving your furniture around. If any of your floors are uneven, fix them. If there are any pets around you, be aware of where they are. Review your medicines with your doctor. Some medicines can make you feel dizzy. This can increase your chance of falling. Ask your doctor what other things that you can do to help prevent falls. This information is not intended to replace advice given to you by your health care provider. Make sure you discuss any questions you have with your health care provider. Document Released: 09/26/2009 Document Revised: 05/07/2016 Document Reviewed: 01/04/2015 Elsevier Interactive Patient Education  2017 [employer].

## 2022-06-03 NOTE — Telephone Encounter (Signed)
error 

## 2022-06-03 NOTE — Telephone Encounter (Signed)
Requesting:tylenol #3 Contract:unknown MOQ:[id number] Last Visit:03/10/22 Next Visit:10/13/22 Last Refill:03/10/22  Please Advise

## 2022-06-04 MED ORDER — ACETAMINOPHEN-CODEINE 300-30 MG PO TABS: 1.0000 | ORAL_TABLET | Freq: Two times a day (BID) | ORAL | 0 refills | Status: DC | PRN

## 2022-06-04 NOTE — Telephone Encounter (Signed)
Pdmp ok, rx sent  ? ?

## 2022-06-14 DIAGNOSIS — S92514A Nondisplaced fracture of proximal phalanx of right lesser toe(s), initial encounter for closed fracture: Secondary | ICD-10-CM | POA: Diagnosis not present

## 2022-06-25 DIAGNOSIS — S92911D Unspecified fracture of right toe(s), subsequent encounter for fracture with routine healing: Secondary | ICD-10-CM | POA: Diagnosis not present

## 2022-06-30 ENCOUNTER — Ambulatory Visit (INDEPENDENT_AMBULATORY_CARE_PROVIDER_SITE_OTHER): Payer: Medicare PPO | Admitting: Psychology

## 2022-06-30 DIAGNOSIS — F330 Major depressive disorder, recurrent, mild: Secondary | ICD-10-CM | POA: Diagnosis not present

## 2022-06-30 NOTE — Progress Notes (Signed)
PROGRESS NOTE:  Patient Name: [name] [name]  Date of Birth: 1951-11-28  MRN: [id number]  PCP: Marrian Salvage, [place]   Date of Service: 06/02/2022 Start: 12:00 PM End:  12:55 PM   Today I met with [name] [name] in a remote video (WebEx) face to face individual psychotherapy.   Distance Site: Client's Home  Originating Site: Dr. 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 2) Increase the level of activity by identifying and engaging in values-consistent pleasurable activities. 3) Develop healthy thinking patterns and beliefs     Client Statement of Needs: I want to be happy and live a fuller life, enjoy being retired   Treatment Level: Biweekly Individual Outpatient Psychotherapy  Symptoms:Complains of generalized pain in many joints, muscles, and bones that debilitates normal functioning. Decrease or loss of appetite.  Depressed or irritable mood.  Exhibits signs and symptoms of depression. Experiences an increase in general physical discomfort (e.g., fatigue, insomnia, muscle tension, body aches). Experiences back, and leg pain Lack of energy.  Sleeplessness or hypersomnia.  Unresolved grief issues.   Client Treatment Preferences: female therapist   Healthcare consumer's goal for treatment:  Psychologist, Dr. Royetta Crochet, Ph.D. will support the patient's ability to achieve the goals identified. Cognitive Behavioral Therapy, Dialectical Behavioral Therapy, Motivational Interviewing,  reminiscing and other evidenced-based practices will be used to promote progress towards healthy functioning.   Healthcare consumer, [place] will: Actively participate in therapy, working towards healthy functioning.    *Justification for Continuation/Discontinuation of Goal: R=Revised, O=Ongoing, A=Achieved, D=Discontinued  Goal 1) Integrate and implement all new mental, somatic, and behavioral ways of managing pain.  Target Date Goal Was reviewed Status Code Progress towards goal/5 pt Likert rating  03/25/2023 03/24/2022          O 2/5             Goal 2) Develop healthy thinking patterns and beliefs about self, others, and the world that lead to  the alleviation and help prevent the relapse of depression.  Target Date Goal Was reviewed Status Code Progress towards goal/5 pt Likert rating  03/25/2023 03/24/2022          O 2/5             Goal 3) Increase the level and range of activity by identifying and engaging in values-consistent  pleasurable activities.  Target Date Goal Was reviewed Status Code Progress towards goal/5 pt Likert rating  03/25/2023 03/24/2022           O 1/5             This plan has been reviewed and created by the following participants:  This plan will be reviewed  at least every 12 months. Date Behavioral Health Clinician Date Guardian/Patient   03/24/2022 Royetta Crochet, Ph.D.  03/24/2022 [name] [name]                   [name] [name] reports that she has been feeling depressed but doesn't know why she's depressed.  As we continued to  d/ the past month since our last appointment, the became clearer to me why she was depressed.  She shared that a friend and two extended family members passed away and another friend was diagnosed with cancer.   I helped her to make the connection between her depressed mood to recent losses, her feelings of loneliness and of her own mortality.  I encouraged her to reminisce about her best friend and acknowledged the great  loss her death represents to her.    Royetta Crochet, PhD                 Royetta Crochet, PhD

## 2022-07-14 ENCOUNTER — Ambulatory Visit (INDEPENDENT_AMBULATORY_CARE_PROVIDER_SITE_OTHER): Payer: Medicare PPO | Admitting: Psychology

## 2022-07-14 DIAGNOSIS — F330 Major depressive disorder, recurrent, mild: Secondary | ICD-10-CM | POA: Diagnosis not present

## 2022-07-14 NOTE — Progress Notes (Signed)
PROGRESS NOTE:  Patient Name: [name] [name]  Date of Birth: 1951/11/17  MRN: [id number]  PCP: [name] [name], [name] [name]   Date of Service: 07/14/2022 Start: 12:00 PM End:  12:55 PM   Today I met with [name] [name] in a remote video (WebEx) face to face individual psychotherapy.   Distance Site: Client's Home  Originating Site: Dr. 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 2) Increase the level of activity by identifying and engaging in values-consistent pleasurable activities. 3) Develop healthy thinking patterns and beliefs     Client Statement of Needs: I want to be happy and live a fuller life, enjoy being retired   Treatment Level: Biweekly Individual Outpatient Psychotherapy  Symptoms:Complains of generalized pain in many joints, muscles, and bones that debilitates normal functioning. Decrease or loss of appetite.  Depressed or irritable mood.  Exhibits signs and symptoms of depression. Experiences an increase in general physical discomfort (e.g., fatigue, insomnia, muscle tension, body aches). Experiences back, and leg pain Lack of energy.  Sleeplessness or hypersomnia.  Unresolved grief issues.   Client Treatment Preferences: female therapist   Healthcare consumer's goal for treatment:  Psychologist, Dr. Royetta [name], Ph.D. will support the patient's ability to achieve the goals identified. Cognitive Behavioral Therapy, Dialectical Behavioral Therapy, Motivational Interviewing, reminiscing and  other evidenced-based practices will be used to promote progress towards healthy functioning.   Healthcare consumer, [name] [name] will: Actively participate in therapy, working towards healthy functioning.    *Justification for Continuation/Discontinuation of Goal: R=Revised, O=Ongoing, A=Achieved, D=Discontinued  Goal 1) Integrate and implement all new mental, somatic, and behavioral ways of managing pain.  Target Date Goal Was reviewed Status Code Progress towards goal/5 pt Likert rating  03/25/2023 03/24/2022          O 2/5             Goal 2) Develop healthy thinking patterns and beliefs about self, others, and the world that lead to  the alleviation and help prevent the relapse of depression.  Target Date Goal Was reviewed Status Code Progress towards goal/5 pt Likert rating  03/25/2023 03/24/2022          O 2/5             Goal 3) Increase the level and range of activity by identifying and engaging in values-consistent  pleasurable activities.  Target Date Goal Was reviewed Status Code Progress towards goal/5 pt Likert rating  03/25/2023 03/24/2022           O 1/5             This plan has been reviewed and created by the following participants:  This plan will be reviewed  at least every 12 months. Date Behavioral Health Clinician Date Guardian/Patient   03/24/2022 [name] [name], Ph.D.  03/24/2022 [name] [name]                    [name] [name] reports that she was feeling a little less depressed than the last time we spoke.  She is watching her  grandchildren more and we agreed that this has helped her mood.  She was still feeling depressed as she thought about her mother, grandmother and a recent family death.  But she has also been more aware of her aging and not being able to do as much as she used to.     [name] Crochet, PhD

## 2022-07-28 ENCOUNTER — Ambulatory Visit (INDEPENDENT_AMBULATORY_CARE_PROVIDER_SITE_OTHER): Payer: Medicare PPO | Admitting: Psychology

## 2022-07-28 DIAGNOSIS — F330 Major depressive disorder, recurrent, mild: Secondary | ICD-10-CM

## 2022-07-28 NOTE — Progress Notes (Signed)
PROGRESS NOTE:  Name: Torianne Laflam Dalia Date: 07/28/2022 MRN: [id number] DOB: 1951/05/24 PCP: Marrian Salvage, FNP  Time spent:  Start: 12:00 PM End:  12:55 PM   Today I met with Kaleeah B Mikrut in a remote video (WebEx) face to face individual psychotherapy.   Distance Site: Client's Home  Originating Site: Dr. 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 2) Increase the level of activity by identifying and engaging in values-consistent pleasurable activities. 3) Develop healthy thinking patterns and beliefs     Client Statement of Needs: I want to be happy and live a fuller life, enjoy being retired   Treatment Level: Biweekly Individual Outpatient Psychotherapy  Symptoms:Complains of generalized pain in many joints, muscles, and bones that debilitates normal functioning. Decrease or loss of appetite.  Depressed or irritable mood.  Exhibits signs and symptoms of depression. Experiences an increase in general physical discomfort (e.g., fatigue, insomnia, muscle tension, body aches). Experiences back, and leg pain Lack of energy.  Sleeplessness or hypersomnia.  Unresolved grief issues.   Client Treatment Preferences: female therapist   Healthcare consumer's goal for treatment:  Psychologist, Dr. Royetta Crochet, Ph.D. will support the patient's ability to achieve the goals identified. Cognitive Behavioral Therapy, Dialectical Behavioral Therapy, Motivational Interviewing, reminiscing and other evidenced-based  practices will be used to promote progress towards healthy functioning.   Healthcare consumer, [place] will: Actively participate in therapy, working towards healthy functioning.    *Justification for Continuation/Discontinuation of Goal: R=Revised, O=Ongoing, A=Achieved, D=Discontinued  Goal 1) Integrate and implement all new mental, somatic, and behavioral ways of managing pain.  Target Date Goal Was reviewed Status Code Progress towards goal/5 pt Likert rating  03/25/2023 03/24/2022          O 2/5             Goal 2) Develop healthy thinking patterns and beliefs about self, others, and the world that lead to  the alleviation and help prevent the relapse of depression.  Target Date Goal Was reviewed Status Code Progress towards goal/5 pt Likert rating  03/25/2023 03/24/2022          O 2/5             Goal 3) Increase the level and range of activity by identifying and engaging in values-consistent  pleasurable activities.  Target Date Goal Was reviewed Status Code Progress towards goal/5 pt Likert rating  03/25/2023 03/24/2022           O 1/5             This plan has been reviewed and created by the following participants:  This plan will be reviewed  at least every 12 months. Date Behavioral Health Clinician Date Guardian/Patient   03/24/2022 Royetta Crochet, Ph.D.  03/24/2022 Hiilei Grime                    Herta Greenhalgh reports that she continues to feel depressed.  After some questioning, she stated that her other grandson Celine Ahr was recently diagnosed with Autism.  Horace states they  were worried because he wasn't speaking.  They had to wait until he was 70 years old to be tested.  Ricquel is feeling guilty and responsible that she may have "passed something on to them."  We d/e/p her thoughts and demystified some of her worries.  Noor states that she's had a bad cold and that she got it from her daughter-in-law who was in own from [place].  As she went on to describe  her symptoms, it was clear that she had COVID (ie., cough, fever, headache, loss of taste,etc.).  I encouraged her to speak to her doctor, to rest and suggestions for how to take in nutrition even tough she had no appetite.  She agree to try but didn't know about calling her doctor.    Royetta Crochet, PhD    Grandsons:  [place] (7) [place] (3) Crew (1)

## 2022-08-11 ENCOUNTER — Ambulatory Visit (INDEPENDENT_AMBULATORY_CARE_PROVIDER_SITE_OTHER): Payer: Medicare PPO | Admitting: Psychology

## 2022-08-11 DIAGNOSIS — F330 Major depressive disorder, recurrent, mild: Secondary | ICD-10-CM | POA: Diagnosis not present

## 2022-08-11 NOTE — Progress Notes (Signed)
PROGRESS NOTE:  Name: [name] [name] Date: 08/11/2022 MRN: [id number] DOB: 04-05-1951 PCP: [name] Salvage, FNP  Time spent:  Start: 12:00 PM End:  12:55 PM   Today I met with [name] [name] in a remote video (WebEx) face to face individual psychotherapy.   Distance Site: Client's Home  Originating Site: Dr. 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 2) Increase the level of activity by identifying and engaging in values-consistent pleasurable activities. 3) Develop healthy thinking patterns and beliefs     Client Statement of Needs: I want to be happy and live a fuller life, enjoy being retired   Treatment Level: Biweekly Individual Outpatient Psychotherapy  Symptoms:Complains of generalized pain in many joints, muscles, and bones that debilitates normal functioning. Decrease or loss of appetite.  Depressed or irritable mood.  Exhibits signs and symptoms of depression. Experiences an increase in general physical discomfort (e.g., fatigue, insomnia, muscle tension, body aches). Experiences back, and leg pain Lack of energy.  Sleeplessness or hypersomnia.  Unresolved grief issues.   Client Treatment Preferences: female therapist   Healthcare consumer's goal for treatment:  Psychologist, Dr. Royetta [name], Ph.D. will support the patient's ability to achieve the goals identified. Cognitive Behavioral Therapy, Dialectical Behavioral Therapy, Motivational Interviewing, reminiscing and other evidenced-based  practices will be used to promote progress towards healthy functioning.   Healthcare consumer, [name] [name] will: Actively participate in therapy, working towards healthy functioning.    *Justification for Continuation/Discontinuation of Goal: R=Revised, O=Ongoing, A=Achieved, D=Discontinued  Goal 1) Integrate and implement all new mental, somatic, and behavioral ways of managing pain.  Target Date Goal Was reviewed Status Code Progress towards goal/5 pt Likert rating  03/25/2023 03/24/2022          O 2/5             Goal 2) Develop healthy thinking patterns and beliefs about self, others, and the world that lead to  the alleviation and help prevent the relapse of depression.  Target Date Goal Was reviewed Status Code Progress towards goal/5 pt Likert rating  03/25/2023 03/24/2022          O 2/5             Goal 3) Increase the level and range of activity by identifying and engaging in values-consistent  pleasurable activities.  Target Date Goal Was reviewed Status Code Progress towards goal/5 pt Likert rating  03/25/2023 03/24/2022           O 1/5             This plan has been reviewed and created by the following participants:  This plan will be reviewed  at least every 12 months. Date Behavioral Health Clinician Date Guardian/Patient   03/24/2022 [name] [name], Ph.D.  03/24/2022 [name] [name]                    [name] [name] reports that she continues to feel sick.  She keeps calling it a cold, when what she has is COVID.  She has finally been able to  taste her food some.  We talked about the need to make sure she is eat small amount throughout the day.  [name] [name]'s mother's birthday is this week.  She passed away four years ago and she is missing her.  I encouraged her to talk about her mother and reminisce about the good times they had together.    [name] Crochet, PhD    Grandsons:  [name] [name] (7) [name] [name] (3) [name] [name] (1)

## 2022-08-20 ENCOUNTER — Other Ambulatory Visit: Payer: Self-pay | Admitting: Family

## 2022-08-20 DIAGNOSIS — Z1231 Encounter for screening mammogram for malignant neoplasm of breast: Secondary | ICD-10-CM

## 2022-08-25 ENCOUNTER — Ambulatory Visit: Payer: Medicare PPO | Admitting: Psychology

## 2022-09-01 ENCOUNTER — Other Ambulatory Visit: Payer: Self-pay | Admitting: Internal Medicine

## 2022-09-01 ENCOUNTER — Other Ambulatory Visit: Payer: Self-pay | Admitting: Family

## 2022-09-02 NOTE — Telephone Encounter (Signed)
Requesting: Tylenol #3  Contract: None UDS: None Last Visit: 03/10/22 Next Visit: 10/13/22 Last Refill: 06/04/22 #30 and 0RF  Please Advise

## 2022-09-08 ENCOUNTER — Ambulatory Visit (INDEPENDENT_AMBULATORY_CARE_PROVIDER_SITE_OTHER): Payer: Medicare PPO | Admitting: Psychology

## 2022-09-08 DIAGNOSIS — F330 Major depressive disorder, recurrent, mild: Secondary | ICD-10-CM | POA: Diagnosis not present

## 2022-09-08 NOTE — Progress Notes (Signed)
PROGRESS NOTE:  Name: [name] [name] Date: 09/08/2022 MRN: [id number] DOB: 1951/09/28 PCP: Marrian Salvage, FNP  Time spent:  Start: 12:00 PM End:  12:55 PM   Today I met with [name] [name] in a remote video (WebEx) face to face individual psychotherapy.   Distance Site: Client's Home  Originating Site: Dr. 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 2) Increase the level of activity by identifying and engaging in values-consistent pleasurable activities. 3) Develop healthy thinking patterns and beliefs     Client Statement of Needs: I want to be happy and live a fuller life, enjoy being retired   Treatment Level: Biweekly Individual Outpatient Psychotherapy  Symptoms:Complains of generalized pain in many joints, muscles, and bones that debilitates normal functioning. Decrease or loss of appetite.  Depressed or irritable mood.  Exhibits signs and symptoms of depression. Experiences an increase in general physical discomfort (e.g., fatigue, insomnia, muscle tension, body aches). Experiences back, and leg pain Lack of energy.  Sleeplessness or hypersomnia.  Unresolved grief issues.   Client Treatment Preferences: female therapist   Healthcare consumer's goal for treatment:  Psychologist, Dr. Royetta Crochet, Ph.D. will support the patient's ability to achieve the goals identified. Cognitive Behavioral Therapy, Dialectical Behavioral Therapy, Motivational Interviewing, reminiscing and other evidenced-based  practices will be used to promote progress towards healthy functioning.   Healthcare consumer, [name] will: Actively participate in therapy, working towards healthy functioning.    *Justification for Continuation/Discontinuation of Goal: R=Revised, O=Ongoing, A=Achieved, D=Discontinued  Goal 1) Integrate and implement all new mental, somatic, and behavioral ways of managing pain.  Target Date Goal Was reviewed Status Code Progress towards goal/5 pt Likert rating  03/25/2023 03/24/2022          O 2/5             Goal 2) Develop healthy thinking patterns and beliefs about self, others, and the world that lead to  the alleviation and help prevent the relapse of depression.  Target Date Goal Was reviewed Status Code Progress towards goal/5 pt Likert rating  03/25/2023 03/24/2022          O 2/5             Goal 3) Increase the level and range of activity by identifying and engaging in values-consistent  pleasurable activities.  Target Date Goal Was reviewed Status Code Progress towards goal/5 pt Likert rating  03/25/2023 03/24/2022           O 1/5             This plan has been reviewed and created by the following participants:  This plan will be reviewed  at least every 12 months. Date Behavioral Health Clinician Date Guardian/Patient   03/24/2022 Royetta Crochet, Ph.D.  03/24/2022 [name]                    [name] [name] reports that she has kept her two grand babies.  She says it's been tiring "running after them" but it makes her happy to spend time with them.  [name] [name] is struggling with pain in her back.  Her daughter was finally able to convince her to take the Tylenol 3 that was prescribed to her for her pain.    This week is the anniversary of [name] [name]'s mother's death.  We d/p her feelings of loss and grief.   Royetta Crochet, PhD    Grandsons:  [place] (7) Cannon (3) Crew (1)

## 2022-09-18 ENCOUNTER — Ambulatory Visit
Admission: RE | Admit: 2022-09-18 | Discharge: 2022-09-18 | Disposition: A | Discharge status: Home / Self Care | Payer: Medicare PPO | Source: Ambulatory Visit | Attending: Family | Admitting: Family

## 2022-09-18 DIAGNOSIS — Z1231 Encounter for screening mammogram for malignant neoplasm of breast: Secondary | ICD-10-CM | POA: Diagnosis not present

## 2022-09-22 ENCOUNTER — Ambulatory Visit (INDEPENDENT_AMBULATORY_CARE_PROVIDER_SITE_OTHER): Payer: Medicare PPO | Admitting: Psychology

## 2022-09-22 DIAGNOSIS — F330 Major depressive disorder, recurrent, mild: Secondary | ICD-10-CM | POA: Diagnosis not present

## 2022-09-22 NOTE — Progress Notes (Signed)
 PROGRESS NOTE:  Name: [name] [name] Date: 09/22/2022 MRN: [id number] DOB: 10/09/1951 PCP: [name] [name], [name] Woodruff, FNP  Time spent:  Start: 12:00 PM End:  12:55 PM   Today I met with [name] [name] in a remote video (WebEx) face to face individual psychotherapy.   Distance Site: Client's Home  Originating Site: Dr. 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 2) Increase the level of activity by identifying and engaging in values-consistent pleasurable activities. 3) Develop healthy thinking patterns and beliefs     Client Statement of Needs: I want to be happy and live a fuller life, enjoy being retired   Treatment Level: Biweekly Individual Outpatient Psychotherapy  Symptoms:Complains of generalized pain in many joints, muscles, and bones that debilitates normal functioning. Decrease or loss of appetite.  Depressed or irritable mood.  Exhibits signs and symptoms of depression. Experiences an increase in general physical discomfort (e.g., fatigue, insomnia, muscle tension, body aches). Experiences back, and leg pain Lack of energy.  Sleeplessness or hypersomnia.  Unresolved grief issues.   Client Treatment Preferences: female therapist   Healthcare consumer's goal for treatment:  Psychologist, Dr. Mary Ann Garcia, Ph.D. will support the patient's ability to achieve the goals identified. Cognitive Behavioral Therapy, Dialectical Behavioral Therapy, Motivational Interviewing, reminiscing and other  evidenced-based practices will be used to promote progress towards healthy functioning.   Healthcare consumer, [name] [name] will: Actively participate in therapy, working towards healthy functioning.    *Justification for Continuation/Discontinuation of Goal: R=Revised, O=Ongoing, A=Achieved, D=Discontinued  Goal 1) Integrate and implement all new mental, somatic, and behavioral ways of managing pain.  Target Date Goal Was reviewed Status Code Progress towards goal/5 pt Likert rating  03/25/2023 03/24/2022          O 2/5             Goal 2) Develop healthy thinking patterns and beliefs about self, others, and the world that lead to  the alleviation and help prevent the relapse of depression.  Target Date Goal Was reviewed Status Code Progress towards goal/5 pt Likert rating  03/25/2023 03/24/2022          O 2/5             Goal 3) Increase the level and range of activity by identifying and engaging in values-consistent  pleasurable activities.  Target Date Goal Was reviewed Status Code Progress towards goal/5 pt Likert rating  03/25/2023 03/24/2022           O 1/5             This plan has been reviewed and created by the following participants:  This plan will be reviewed  at least every 12 months. Date Behavioral Health Clinician Date Guardian/Patient   03/24/2022 Mary Ann Garcia, Ph.D.  03/24/2022 [name] [name]                    [name] [name] reports that she has gotten sick again.  She has been taking care of her grand babies but they often get her sick.  We d/p the   pros and cons of being their caretaker and concluded that it's worth the inconvenience of getting sick periodically.  We further d/ the benefits for her mental health to have her grandchildren around.  We d/ ways for her to manage her self care and boost her immune system.   Mary Ann Garcia, PhD    Grandsons:  [name] [name] (7) [name] [name] (3) [name] [name] (1)  

## 2022-10-03 ENCOUNTER — Other Ambulatory Visit: Payer: Self-pay | Admitting: Cardiology

## 2022-10-03 DIAGNOSIS — I739 Peripheral vascular disease, unspecified: Secondary | ICD-10-CM

## 2022-10-06 ENCOUNTER — Ambulatory Visit (INDEPENDENT_AMBULATORY_CARE_PROVIDER_SITE_OTHER): Payer: Medicare PPO | Admitting: Psychology

## 2022-10-06 DIAGNOSIS — F330 Major depressive disorder, recurrent, mild: Secondary | ICD-10-CM

## 2022-10-06 DIAGNOSIS — F3341 Major depressive disorder, recurrent, in partial remission: Secondary | ICD-10-CM

## 2022-10-06 NOTE — Progress Notes (Signed)
PROGRESS NOTE:  Name: [name] [name] Card Date: 10/06/2022 MRN: [id number] DOB: 25-Nov-1951 PCP: Marrian Salvage, FNP  Time spent:  Start: 12:00 PM End:  12:55 PM   Today I met with [name] [name] in a remote video (WebEx) face to face individual psychotherapy.   Distance Site: Client's Home  Originating Site: Dr. 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 2) Increase the level of activity by identifying and engaging in values-consistent pleasurable activities. 3) Develop healthy thinking patterns and beliefs     Client Statement of Needs: I want to be happy and live a fuller life, enjoy being retired   Treatment Level: Biweekly Individual Outpatient Psychotherapy  Symptoms:Complains of generalized pain in many joints, muscles, and bones that debilitates normal functioning. Decrease or loss of appetite.  Depressed or irritable mood.  Exhibits signs and symptoms of depression. Experiences an increase in general physical discomfort (e.g., fatigue, insomnia, muscle tension, body aches). Experiences back, and leg pain Lack of energy.  Sleeplessness or hypersomnia.  Unresolved grief issues.   Client Treatment Preferences: female therapist   Healthcare consumer's goal for treatment:  Psychologist, Dr. Royetta Crochet, Ph.D. will support the patient's ability to achieve the goals identified. Cognitive Behavioral Therapy, Dialectical Behavioral Therapy, Motivational Interviewing, reminiscing and other  evidenced-based practices will be used to promote progress towards healthy functioning.   Healthcare consumer, [name] [name] will: Actively participate in therapy, working towards healthy functioning.    *Justification for Continuation/Discontinuation of Goal: R=Revised, O=Ongoing, A=Achieved, D=Discontinued  Goal 1) Integrate and implement all new mental, somatic, and behavioral ways of managing pain.  Target Date Goal Was reviewed Status Code Progress towards goal/5 pt Likert rating  03/25/2023 03/24/2022          O 2/5             Goal 2) Develop healthy thinking patterns and beliefs about self, others, and the world that lead to  the alleviation and help prevent the relapse of depression.  Target Date Goal Was reviewed Status Code Progress towards goal/5 pt Likert rating  03/25/2023 03/24/2022          O 2/5             Goal 3) Increase the level and range of activity by identifying and engaging in values-consistent  pleasurable activities.  Target Date Goal Was reviewed Status Code Progress towards goal/5 pt Likert rating  03/25/2023 03/24/2022           O 1/5             This plan has been reviewed and created by the following participants:  This plan will be reviewed  at least every 12 months. Date Behavioral Health Clinician Date Guardian/Patient   03/24/2022 Royetta Crochet, Ph.D.  03/24/2022 [place] Muto                    [name] [name] reports that her cough is still waking her in the middle of the night.  She is not wanting to test for COVID or RSV because she "doesn't  want any more bad news."  She did finally admit when pressed that she made an appointment with her PCP to get a check up.  Philip's greatest stress is in dealing with one of her son's who is putting unreasonable expectations on [name] [name] to care for her great grandchildren.  We d/e/p the complicated family dynamics that exist between her adult children and the need to maintain her limits/boundaries.   Royetta Crochet, PhD    Grandsons:  [place] (7) Cannon (3) Crew (1)

## 2022-10-13 ENCOUNTER — Encounter: Payer: Self-pay | Admitting: Family

## 2022-10-13 ENCOUNTER — Ambulatory Visit (INDEPENDENT_AMBULATORY_CARE_PROVIDER_SITE_OTHER): Payer: Medicare PPO | Admitting: Family

## 2022-10-13 VITALS — BP 110/58 | HR 66 | Temp 98.4°F | Ht 59.0 in | Wt 144.4 lb

## 2022-10-13 DIAGNOSIS — E785 Hyperlipidemia, unspecified: Secondary | ICD-10-CM | POA: Diagnosis not present

## 2022-10-13 DIAGNOSIS — E039 Hypothyroidism, unspecified: Secondary | ICD-10-CM | POA: Diagnosis not present

## 2022-10-13 DIAGNOSIS — E2839 Other primary ovarian failure: Secondary | ICD-10-CM | POA: Diagnosis not present

## 2022-10-13 DIAGNOSIS — Z13820 Encounter for screening for osteoporosis: Secondary | ICD-10-CM | POA: Diagnosis not present

## 2022-10-13 DIAGNOSIS — Z0000 Encounter for general adult medical examination without abnormal findings: Secondary | ICD-10-CM | POA: Diagnosis not present

## 2022-10-13 DIAGNOSIS — Z23 Encounter for immunization: Secondary | ICD-10-CM | POA: Diagnosis not present

## 2022-10-13 MED ORDER — LOSARTAN POTASSIUM-HCTZ 50-12.5 MG PO TABS: 1.0000 | ORAL_TABLET | Freq: Every day | ORAL | 0 refills | Status: DC

## 2022-10-13 MED ORDER — METHOCARBAMOL 500 MG PO TABS: 500.0000 mg | ORAL_TABLET | Freq: Three times a day (TID) | ORAL | 2 refills | Status: DC | PRN

## 2022-10-13 MED ORDER — BENAZEPRIL-HYDROCHLOROTHIAZIDE 10-12.5 MG PO TABS: 1.0000 | ORAL_TABLET | Freq: Every day | ORAL | 3 refills | Status: DC

## 2022-10-13 MED ORDER — CITALOPRAM HYDROBROMIDE 20 MG PO TABS: 20.0000 mg | ORAL_TABLET | Freq: Every day | ORAL | 3 refills | Status: DC

## 2022-10-13 MED ORDER — AZITHROMYCIN 250 MG PO TABS: ORAL_TABLET | ORAL | 0 refills | Status: DC

## 2022-10-13 MED ORDER — FLUTICASONE-SALMETEROL 250-50 MCG/ACT IN AEPB: 1.0000 | INHALATION_SPRAY | Freq: Two times a day (BID) | RESPIRATORY_TRACT | 3 refills | Status: DC

## 2022-10-13 MED ORDER — ROPINIROLE HCL 0.5 MG PO TABS: 0.5000 mg | ORAL_TABLET | Freq: Every day | ORAL | 3 refills | Status: DC

## 2022-10-13 MED ORDER — ACETAMINOPHEN-CODEINE 300-30 MG PO TABS: ORAL_TABLET | ORAL | 0 refills | Status: DC

## 2022-10-13 MED ORDER — METOPROLOL SUCCINATE ER 25 MG PO TB24: 25.0000 mg | ORAL_TABLET | Freq: Every day | ORAL | 3 refills | Status: DC

## 2022-10-13 NOTE — Progress Notes (Signed)
[name] [name] is a 71 y.o. female with the following history as recorded in EpicCare:  Patient Active Problem List   Diagnosis Date Noted   Claudication in peripheral vascular disease ([place]) 08/26/2020   Neurogenic claudication 07/10/2020   Degenerative lumbar spinal stenosis 05/16/2020   Sacroiliac pain 08/10/2019   Anxiety and depression 05/11/2019   Chest pain 05/09/2019   Arthritis of sacroiliac joint of both sides 05/01/2019   Polyarthralgia 12/28/2018   Preventative health care 10/20/2018   RLS (restless legs syndrome) 09/27/2018   Insomnia disorder related to known organic factor 06/27/2018   PLMD (periodic limb movement disorder) 06/27/2018   Hot flashes due to menopause 04/25/2018   Palpitations 04/25/2018   Diaphoresis 04/25/2018   Long term current use of antithrombotics/antiplatelets 04/19/2018   Insomnia 04/19/2018   Intractable episodic headache 02/28/2018   Numbness and tingling of right arm 02/28/2018   Toe pain, right 10/19/2017   Rotator cuff arthropathy of left shoulder 06/02/2017   Trigger thumb of left hand 06/02/2017   Lipoma 05/06/2017   Pain of left thumb 05/06/2017   Chronic pain syndrome 12/15/2016   Lumbar degenerative disc disease 12/15/2016   Left leg pain 01/28/2016   Left shoulder pain 04/09/2015   Impaired glucose tolerance 07/30/2013   Abnormal breath sounds 07/30/2013   Asymptomatic bilateral carotid artery stenosis 05/05/2012   Anxiety 04/03/2011   CVA (cerebral infarction) 03/15/2011   HLD (hyperlipidemia) 11/03/2010   Hx of adenomatous colonic polyps 02/09/2008   Hypothyroidism 11/06/2007   ALLERGIC RHINITIS 11/06/2007   OSTEOPOROSIS 11/06/2007   ALCOHOL ABUSE, HX OF 11/06/2007   Essential hypertension 09/13/2007   Asthma 09/13/2007   PERIMENOPAUSAL STATUS 09/13/2007   Chronic insomnia 09/13/2007    Current Outpatient Medications  Medication Sig Dispense Refill   acitretin (SORIATANE) 25 MG capsule Take 1 capsule by mouth daily.      aspirin EC 81 MG tablet Take 81 mg by mouth daily. Swallow whole.     azithromycin (ZITHROMAX) 250 MG tablet 2 tabs po qd x 1 day; 1 tablet per day x 4 days; 6 tablet 0   Calcium Carbonate-Vit D-Min (CALCIUM 1200 PO) Take 1,200 mg by mouth daily.     Cholecalciferol (VITAMIN D) 50 MCG (2000 UT) tablet Take 2,000 Units by mouth daily.     clopidogrel (PLAVIX) 75 MG tablet TAKE 1 TABLET BY MOUTH EVERY DAY 90 tablet 3   diltiazem (CARDIZEM CD) 180 MG 24 hr capsule Take 1 capsule (180 mg total) by mouth daily. 90 capsule 3   levothyroxine (SYNTHROID) 88 MCG tablet TAKE 1 TABLET BY MOUTH EVERY DAY BEFORE BREAKFAST 90 tablet 1   losartan-hydrochlorothiazide (HYZAAR) 50-12.5 MG tablet Take 1 tablet by mouth daily. 90 tablet 0   montelukast (SINGULAIR) 10 MG tablet TAKE 1 TABLET BY MOUTH EVERY DAY 90 tablet 3   Multiple Vitamin (MULTIVITAMIN WITH MINERALS) TABS tablet Take 1 tablet by mouth daily.     pantoprazole (PROTONIX) 40 MG tablet Take 1 tablet (40 mg total) by mouth 2 (two) times daily. 180 tablet 3   rOPINIRole (REQUIP) 0.5 MG tablet Take 1 tablet (0.5 mg total) by mouth at bedtime. 90 tablet 3   rosuvastatin (CRESTOR) 20 MG tablet Take 1 tablet (20 mg total) by mouth daily. 90 tablet 3   spironolactone (ALDACTONE) 25 MG tablet TAKE 1 TABLET BY MOUTH EVERY DAY IN THE MORNING 90 tablet 1   zolpidem (AMBIEN) 10 MG tablet TAKE 1 TABLET (10 MG TOTAL) BY MOUTH AT  BEDTIME AS NEEDED. FOR SLEEP 90 tablet 0   acetaminophen-codeine (TYLENOL #3) 300-30 MG tablet TAKE 1 TABLET BY MOUTH TWICE A DAY AS NEEDED FOR MODERATE PAIN (Patient not taking: Reported on 10/13/2022) 30 tablet 0   citalopram (CELEXA) 20 MG tablet Take 1 tablet (20 mg total) by mouth daily. 90 tablet 3   ezetimibe (ZETIA) 10 MG tablet TAKE 1 TABLET (10 MG TOTAL) BY MOUTH DAILY AFTER SUPPER. 90 tablet 3   fluticasone-salmeterol (ADVAIR DISKUS) 250-50 MCG/ACT AEPB Inhale 1 puff into the lungs in the morning and at bedtime. 60 each 3    methocarbamol (ROBAXIN) 500 MG tablet Take 1 tablet (500 mg total) by mouth every 8 (eight) hours as needed. 30 tablet 2   metoprolol succinate (TOPROL-XL) 25 MG 24 hr tablet Take 1 tablet (25 mg total) by mouth daily. 90 tablet 3   No current facility-administered medications for this visit.    Allergies: Atorvastatin, Fosamax [alendronate sodium], Penicillins, and Trazodone and nefazodone  Past Medical History:  Diagnosis Date   Acid reflux    ALCOHOL ABUSE, HX OF 11/06/2007   ALLERGIC RHINITIS 11/06/2007   Anxiety 04/03/2011   ASTHMA 09/13/2007   Asthma    Chronic pain syndrome 12/15/2016   COLONIC POLYPS, HX OF 02/09/2008    ADENOMATOUS POLYP   Encounter for well adult exam without abnormal findings 04/03/2011   HYPERLIPIDEMIA 11/03/2010   HYPERTENSION 09/13/2007   HYPOTHYROIDISM 11/06/2007   Impaired glucose tolerance 07/30/2013   INSOMNIA, HX OF 09/13/2007   Lumbar degenerative disc disease 12/15/2016   OSTEOPOROSIS 11/06/2007   PERIMENOPAUSAL STATUS 09/13/2007   RLS (restless legs syndrome)    Stroke [facility])    TIA (transient ischemic attack) 11/04/2011    Past Surgical History:  Procedure Laterality Date   ABDOMINAL AORTOGRAM W/LOWER EXTREMITY N/A 08/27/2020   Procedure: ABDOMINAL AORTOGRAM W/LOWER EXTREMITY;  Surgeon: Nigel Mormon, MD;  Location: [place] [facility];  Service: Cardiovascular;  Laterality: N/A;   BREAST EXCISIONAL BIOPSY Left    BREAST SURGERY  2008 and 2012   x 2 - benign, left side   carotid artery surgery Left    CESAREAN SECTION     x 3   COLONOSCOPY  multiple   2010   PERIPHERAL VASCULAR BALLOON ANGIOPLASTY  08/27/2020   Procedure: PERIPHERAL VASCULAR BALLOON ANGIOPLASTY;  Surgeon: Nigel Mormon, MD;  Location: [place] [facility];  Service: Cardiovascular;;  Right SFA scoring balloon   TRANSFORAMINAL LUMBAR INTERBODY FUSION (TLIF) WITH PEDICLE SCREW FIXATION 1 LEVEL Left 07/10/2020   Procedure: LEFT-SIDED LUMBAR FOUR-FIVE TRANSFORAMINAL LUMBAR  INTERBODY FUSION WITH INSTRUMENTATION AND ALLOGRAFT;  Surgeon: Phylliss Bob, MD;  Location: [place];  Service: Orthopedics;  Laterality: Left;    Family History  Problem Relation Age of Onset   Hypertension Mother 28   Heart disease Mother    Heart failure Mother 36   Hypertension Brother    Colon cancer Brother 24   Dementia Maternal Grandmother    Breast cancer Cousin    Hyperlipidemia Other    Hypertension Other    Coronary artery disease Other     Social History   Tobacco Use   Smoking status: Former    Packs/day: 0.25    Types: Cigarettes    Quit date: 05/31/1979    Years since quitting: 43.4   Smokeless tobacco: Current    Types: Snuff   Tobacco comments:    social smoker  Substance Use Topics   Alcohol use: No  Comment: former alcholic    Subjective:  Patient presents for yearly CPE; has been sick x 3 weeks- asking for refill on Zithromax; has been taking care of her grandchildren;  Would also like to get her flu shot; Does see her cardiologist every 6 months;  Is concerned that one of her medications is causing her to have a "dry cough" and would like to try alternative;  Has lost 11 pounds since last OV- per patient, planned weight loss;   Review of Systems  Constitutional: Negative.   HENT: Negative.    Eyes: Negative.   Respiratory: Negative.    Cardiovascular: Negative.   Gastrointestinal: Negative.   Genitourinary: Negative.   Musculoskeletal: Negative.   Skin: Negative.   Neurological: Negative.   Endo/Heme/Allergies: Negative.   Psychiatric/Behavioral: Negative.       Objective:  Vitals:   10/13/22 1445  BP: (!) 110/58  Pulse: 66  Temp: 98.4 F (36.9 C)  TempSrc: Oral  SpO2: 98%  Weight: 144 lb 6.4 oz (65.5 kg)  Height: _0  (1.499 m)    General: Well developed, well nourished, in no acute distress  Skin : Warm and dry.  Head: Normocephalic and atraumatic  Eyes: Sclera and conjunctiva clear; pupils round and reactive to light;  extraocular movements intact  Ears: External normal; canals clear; tympanic membranes normal  Oropharynx: Pink, supple. No suspicious lesions  Neck: Supple without thyromegaly, adenopathy  Lungs: Respirations unlabored; clear to auscultation bilaterally without wheeze, rales, rhonchi  CVS exam: normal rate and regular rhythm.  Abdomen: Soft; nontender; nondistended; normoactive bowel sounds; no masses or hepatosplenomegaly  Musculoskeletal: No deformities; no active joint inflammation  Extremities: No edema, cyanosis, clubbing  Vessels: Symmetric bilaterally  Neurologic: Alert and oriented; speech intact; face symmetrical; moves all extremities well; CNII-XII intact without focal deficit   Assessment:  1. PE (physical exam), annual   2. Ovarian failure   3. Osteoporosis screening   4. Hyperlipidemia, unspecified hyperlipidemia type   5. Acquired hypothyroidism   6. Need for immunization against influenza     Plan:  Age appropriate preventive healthcare needs addressed; encouraged regular eye doctor and dental exams; encouraged regular exercise; will update labs and refills as needed today; follow-up to be determined; Order for DEXA updated; flu shot updated;  Will give Z-pak to fill if needed; will also change from Benazepril HCT to Lotensin HCT to see if cough improves;  Plan to follow up in 3 months, sooner prn- to consider CXR is weight loss continues;   Return in about 3 months (around 01/13/2023).  Orders Placed This Encounter  Procedures   DG Bone Density    Standing Status:   Future    Standing Expiration Date:   10/14/2023    Order Specific Question:   Reason for Exam (SYMPTOM  OR DIAGNOSIS REQUIRED)    Answer:   ovarian failure/ osteoporosis screening    Order Specific Question:   Preferred imaging location?    Answer:   [facility]   Flu Vaccine QUAD High Dose(Fluad)   CBC with Differential/Platelet   Comp Met (CMET)   Lipid panel   TSH    Requested  Prescriptions   Signed Prescriptions Disp Refills   rOPINIRole (REQUIP) 0.5 MG tablet 90 tablet 3    Sig: Take 1 tablet (0.5 mg total) by mouth at bedtime.   azithromycin (ZITHROMAX) 250 MG tablet 6 tablet 0    Sig: 2 tabs po qd x 1 day; 1 tablet per day x 4  days;   citalopram (CELEXA) 20 MG tablet 90 tablet 3    Sig: Take 1 tablet (20 mg total) by mouth daily.   fluticasone-salmeterol (ADVAIR DISKUS) 250-50 MCG/ACT AEPB 60 each 3    Sig: Inhale 1 puff into the lungs in the morning and at bedtime.   methocarbamol (ROBAXIN) 500 MG tablet 30 tablet 2    Sig: Take 1 tablet (500 mg total) by mouth every 8 (eight) hours as needed.   metoprolol succinate (TOPROL-XL) 25 MG 24 hr tablet 90 tablet 3    Sig: Take 1 tablet (25 mg total) by mouth daily.   losartan-hydrochlorothiazide (HYZAAR) 50-12.5 MG tablet 90 tablet 0    Sig: Take 1 tablet by mouth daily.

## 2022-10-13 NOTE — Patient Instructions (Signed)
PLEASE DO NOT KEEP TAKING BENAZEPRIL HCT- change to Losartan HCT; this should help the cough;

## 2022-10-14 LAB — CBC WITH DIFFERENTIAL/PLATELET
Basophils Absolute: 0 10*3/uL (ref 0.0–0.1)
Basophils Relative: 0.6 % (ref 0.0–3.0)
Eosinophils Absolute: 0.1 10*3/uL (ref 0.0–0.7)
Eosinophils Relative: 2.2 % (ref 0.0–5.0)
HCT: 37.1 % (ref 36.0–46.0)
Hemoglobin: 12.2 g/dL (ref 12.0–15.0)
Lymphocytes Relative: 32.5 % (ref 12.0–46.0)
Lymphs Abs: 2.1 10*3/uL (ref 0.7–4.0)
MCHC: 33 g/dL (ref 30.0–36.0)
MCV: 95.2 fl (ref 78.0–100.0)
Monocytes Absolute: 0.6 10*3/uL (ref 0.1–1.0)
Monocytes Relative: 9.5 % (ref 3.0–12.0)
Neutro Abs: 3.6 10*3/uL (ref 1.4–7.7)
Neutrophils Relative %: 55.2 % (ref 43.0–77.0)
Platelets: 239 10*3/uL (ref 150.0–400.0)
RBC: 3.89 Mil/uL (ref 3.87–5.11)
RDW: 14.3 % (ref 11.5–15.5)
WBC: 6.5 10*3/uL (ref 4.0–10.5)

## 2022-10-14 LAB — COMPREHENSIVE METABOLIC PANEL
ALT: 13 U/L (ref 0–35)
AST: 19 U/L (ref 0–37)
Albumin: 4.4 g/dL (ref 3.5–5.2)
Alkaline Phosphatase: 91 U/L (ref 39–117)
BUN: 23 mg/dL (ref 6–23)
CO2: 27 mEq/L (ref 19–32)
Calcium: 9.8 mg/dL (ref 8.4–10.5)
Chloride: 102 mEq/L (ref 96–112)
Creatinine, Ser: 1.25 mg/dL — ABNORMAL HIGH (ref 0.40–1.20)
GFR: 43.68 mL/min — ABNORMAL LOW (ref >60.00)
Glucose, Bld: 114 mg/dL — ABNORMAL HIGH (ref 70–99)
Potassium: 4.2 mEq/L (ref 3.5–5.1)
Sodium: 137 mEq/L (ref 135–145)
Total Bilirubin: 0.5 mg/dL (ref 0.2–1.2)
Total Protein: 7.1 g/dL (ref 6.0–8.3)

## 2022-10-14 LAB — LIPID PANEL
Cholesterol: 147 mg/dL (ref 0–200)
HDL: 54.4 mg/dL (ref >39.00)
LDL Cholesterol: 76 mg/dL (ref 0–99)
NonHDL: 92.77
Total CHOL/HDL Ratio: 3
Triglycerides: 85 mg/dL (ref 0.0–149.0)
VLDL: 17 mg/dL (ref 0.0–40.0)

## 2022-10-14 LAB — TSH: TSH: 0.71 u[IU]/mL (ref 0.35–5.50)

## 2022-10-15 ENCOUNTER — Other Ambulatory Visit: Payer: Self-pay | Admitting: Family

## 2022-10-15 MED ORDER — LEVOTHYROXINE SODIUM 88 MCG PO TABS: ORAL_TABLET | ORAL | 3 refills | Status: DC

## 2022-10-20 ENCOUNTER — Ambulatory Visit (INDEPENDENT_AMBULATORY_CARE_PROVIDER_SITE_OTHER): Payer: Medicare PPO | Admitting: Psychology

## 2022-10-20 DIAGNOSIS — F330 Major depressive disorder, recurrent, mild: Secondary | ICD-10-CM | POA: Diagnosis not present

## 2022-10-20 NOTE — Progress Notes (Signed)
PROGRESS NOTE:  Name: [name] [name] Date: 10/20/2022 MRN: [id number] DOB: [birth year] PCP: Marrian Salvage, FNP  Time spent:  Start: 12:00 PM End:  12:55 PM   Today I met with [name] [name] in a remote video (WebEx) face to face individual psychotherapy.   Distance Site: Client's Home  Originating Site: Dr. 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 2) Increase the level of activity by identifying and engaging in values-consistent pleasurable activities. 3) Develop healthy thinking patterns and beliefs     Client Statement of Needs: I want to be happy and live a fuller life, enjoy being retired   Treatment Level: Biweekly Individual Outpatient Psychotherapy  Symptoms:Complains of generalized pain in many joints, muscles, and bones that debilitates normal functioning. Decrease or loss of appetite.  Depressed or irritable mood.  Exhibits signs and symptoms of depression. Experiences an increase in general physical discomfort (e.g., fatigue, insomnia, muscle tension, body aches). Experiences back, and leg pain Lack of energy.  Sleeplessness or hypersomnia.  Unresolved grief issues.   Client Treatment Preferences: female therapist   Healthcare consumer's goal for treatment:  Psychologist, Dr. Royetta Crochet, Ph.D. will support the patient's ability to achieve the goals identified. Cognitive Behavioral Therapy, Dialectical Behavioral Therapy, Motivational Interviewing, reminiscing and other  evidenced-based practices will be used to promote progress towards healthy functioning.   Healthcare consumer, [place] will: Actively participate in therapy, working towards healthy functioning.    *Justification for Continuation/Discontinuation of Goal: R=Revised, O=Ongoing, A=Achieved, D=Discontinued  Goal 1) Integrate and implement all new mental, somatic, and behavioral ways of managing pain.  Target Date Goal Was reviewed Status Code Progress towards goal/5 pt Likert rating  03/25/2023 03/24/2022          O 2/5             Goal 2) Develop healthy thinking patterns and beliefs about self, others, and the world that lead to  the alleviation and help prevent the relapse of depression.  Target Date Goal Was reviewed Status Code Progress towards goal/5 pt Likert rating  03/25/2023 03/24/2022          O 2/5             Goal 3) Increase the level and range of activity by identifying and engaging in values-consistent  pleasurable activities.  Target Date Goal Was reviewed Status Code Progress towards goal/5 pt Likert rating  03/25/2023 03/24/2022           O 1/5             This plan has been reviewed and created by the following participants:  This plan will be reviewed  at least every 12 months. Date Behavioral Health Clinician Date Guardian/Patient   03/24/2022 Royetta Crochet, Ph.D.  03/24/2022 [place] Kolinski                    [name] [name] reports that her grandson was  sick all night and she didn't sleep very well.  We had our session after she woke from an early morning  nap.  She shared that she got thorough this birthday season with out feeling too low.  We agreed that having her grandchildren and helping with their care keeps her mind occupied and entertained.  We d/p that she continues to communicate clearly what she is and isn't willing to do and keeps to the limits she sets.  It has also been important to keep out of her adult children's disputes.    Royetta Crochet,  PhD    Grandsons:  [place] (7) [place] (3) Crew (1)

## 2022-10-28 DIAGNOSIS — L400 Psoriasis vulgaris: Secondary | ICD-10-CM | POA: Diagnosis not present

## 2022-11-03 ENCOUNTER — Ambulatory Visit: Payer: Medicare PPO | Admitting: Psychology

## 2022-11-17 ENCOUNTER — Ambulatory Visit: Payer: Medicare PPO | Admitting: Family

## 2022-11-17 ENCOUNTER — Ambulatory Visit: Payer: Medicare PPO | Admitting: Psychology

## 2022-11-17 ENCOUNTER — Encounter: Payer: Self-pay | Admitting: Family

## 2022-11-17 VITALS — BP 136/78 | HR 75 | Temp 98.5°F | Resp 18 | Ht 59.0 in | Wt 145.2 lb

## 2022-11-17 DIAGNOSIS — I10 Essential (primary) hypertension: Secondary | ICD-10-CM | POA: Diagnosis not present

## 2022-11-17 LAB — COMPREHENSIVE METABOLIC PANEL
ALT: 15 U/L (ref 0–35)
AST: 21 U/L (ref 0–37)
Albumin: 4.2 g/dL (ref 3.5–5.2)
Alkaline Phosphatase: 94 U/L (ref 39–117)
BUN: 21 mg/dL (ref 6–23)
CO2: 29 mEq/L (ref 19–32)
Calcium: 9.5 mg/dL (ref 8.4–10.5)
Chloride: 99 mEq/L (ref 96–112)
Creatinine, Ser: 1.18 mg/dL (ref 0.40–1.20)
GFR: 46.78 mL/min — ABNORMAL LOW (ref >60.00)
Glucose, Bld: 122 mg/dL — ABNORMAL HIGH (ref 70–99)
Potassium: 4.3 mEq/L (ref 3.5–5.1)
Sodium: 137 mEq/L (ref 135–145)
Total Bilirubin: 0.4 mg/dL (ref 0.2–1.2)
Total Protein: 6.6 g/dL (ref 6.0–8.3)

## 2022-11-17 NOTE — Progress Notes (Signed)
[name] [name] is a 71 y.o. female with the following history as recorded in EpicCare:  Patient Active Problem List   Diagnosis Date Noted   Claudication in peripheral vascular disease ([place]) 08/26/2020   Neurogenic claudication 07/10/2020   Degenerative lumbar spinal stenosis 05/16/2020   Sacroiliac pain 08/10/2019   Anxiety and depression 05/11/2019   Chest pain 05/09/2019   Arthritis of sacroiliac joint of both sides 05/01/2019   Polyarthralgia 12/28/2018   Preventative health care 10/20/2018   RLS (restless legs syndrome) 09/27/2018   Insomnia disorder related to known organic factor 06/27/2018   PLMD (periodic limb movement disorder) 06/27/2018   Hot flashes due to menopause 04/25/2018   Palpitations 04/25/2018   Diaphoresis 04/25/2018   Long term current use of antithrombotics/antiplatelets 04/19/2018   Insomnia 04/19/2018   Intractable episodic headache 02/28/2018   Numbness and tingling of right arm 02/28/2018   Toe pain, right 10/19/2017   Rotator cuff arthropathy of left shoulder 06/02/2017   Trigger thumb of left hand 06/02/2017   Lipoma 05/06/2017   Pain of left thumb 05/06/2017   Chronic pain syndrome 12/15/2016   Lumbar degenerative disc disease 12/15/2016   Left leg pain 01/28/2016   Left shoulder pain 04/09/2015   Impaired glucose tolerance 07/30/2013   Abnormal breath sounds 07/30/2013   Asymptomatic bilateral carotid artery stenosis 05/05/2012   Anxiety 04/03/2011   CVA (cerebral infarction) 03/15/2011   HLD (hyperlipidemia) 11/03/2010   Hx of adenomatous colonic polyps 02/09/2008   Hypothyroidism 11/06/2007   ALLERGIC RHINITIS 11/06/2007   OSTEOPOROSIS 11/06/2007   ALCOHOL ABUSE, HX OF 11/06/2007   Essential hypertension 09/13/2007   Asthma 09/13/2007   PERIMENOPAUSAL STATUS 09/13/2007   Chronic insomnia 09/13/2007    Current Outpatient Medications  Medication Sig Dispense Refill   acetaminophen-codeine (TYLENOL #3) 300-30 MG tablet TAKE 1 TABLET  BY MOUTH TWICE A DAY AS NEEDED FOR MODERATE PAIN 30 tablet 0   acitretin (SORIATANE) 25 MG capsule Take 1 capsule by mouth daily.     aspirin EC 81 MG tablet Take 81 mg by mouth daily. Swallow whole.     Calcium Carbonate-Vit D-Min (CALCIUM 1200 PO) Take 1,200 mg by mouth daily.     Cholecalciferol (VITAMIN D) 50 MCG (2000 UT) tablet Take 2,000 Units by mouth daily.     citalopram (CELEXA) 20 MG tablet Take 1 tablet (20 mg total) by mouth daily. 90 tablet 3   clopidogrel (PLAVIX) 75 MG tablet TAKE 1 TABLET BY MOUTH EVERY DAY 90 tablet 3   diltiazem (CARDIZEM CD) 180 MG 24 hr capsule Take 1 capsule (180 mg total) by mouth daily. 90 capsule 3   fluticasone-salmeterol (ADVAIR DISKUS) 250-50 MCG/ACT AEPB Inhale 1 puff into the lungs in the morning and at bedtime. 60 each 3   levothyroxine (SYNTHROID) 88 MCG tablet TAKE 1 TABLET BY MOUTH EVERY DAY BEFORE BREAKFAST 90 tablet 3   losartan-hydrochlorothiazide (HYZAAR) 50-12.5 MG tablet Take 1 tablet by mouth daily. 90 tablet 0   methocarbamol (ROBAXIN) 500 MG tablet Take 1 tablet (500 mg total) by mouth every 8 (eight) hours as needed. 30 tablet 2   metoprolol succinate (TOPROL-XL) 25 MG 24 hr tablet Take 1 tablet (25 mg total) by mouth daily. 90 tablet 3   montelukast (SINGULAIR) 10 MG tablet TAKE 1 TABLET BY MOUTH EVERY DAY 90 tablet 3   Multiple Vitamin (MULTIVITAMIN WITH MINERALS) TABS tablet Take 1 tablet by mouth daily.     pantoprazole (PROTONIX) 40 MG tablet Take 1  tablet (40 mg total) by mouth 2 (two) times daily. 180 tablet 3   rOPINIRole (REQUIP) 0.5 MG tablet Take 1 tablet (0.5 mg total) by mouth at bedtime. 90 tablet 3   rosuvastatin (CRESTOR) 20 MG tablet Take 1 tablet (20 mg total) by mouth daily. 90 tablet 3   spironolactone (ALDACTONE) 25 MG tablet TAKE 1 TABLET BY MOUTH EVERY DAY IN THE MORNING 90 tablet 1   zolpidem (AMBIEN) 10 MG tablet TAKE 1 TABLET (10 MG TOTAL) BY MOUTH AT BEDTIME AS NEEDED. FOR SLEEP 90 tablet 0   ezetimibe  (ZETIA) 10 MG tablet TAKE 1 TABLET (10 MG TOTAL) BY MOUTH DAILY AFTER SUPPER. 90 tablet 3   No current facility-administered medications for this visit.    Allergies: Atorvastatin, Fosamax [alendronate sodium], Lisinopril, Penicillins, and Trazodone and nefazodone  Past Medical History:  Diagnosis Date   Acid reflux    ALCOHOL ABUSE, HX OF 11/06/2007   ALLERGIC RHINITIS 11/06/2007   Anxiety 04/03/2011   ASTHMA 09/13/2007   Asthma    Chronic pain syndrome 12/15/2016   COLONIC POLYPS, HX OF 02/09/2008    ADENOMATOUS POLYP   Encounter for well adult exam without abnormal findings 04/03/2011   HYPERLIPIDEMIA 11/03/2010   HYPERTENSION 09/13/2007   HYPOTHYROIDISM 11/06/2007   Impaired glucose tolerance 07/30/2013   INSOMNIA, HX OF 09/13/2007   Lumbar degenerative disc disease 12/15/2016   OSTEOPOROSIS 11/06/2007   PERIMENOPAUSAL STATUS 09/13/2007   RLS (restless legs syndrome)    Stroke [facility])    TIA (transient ischemic attack) 11/04/2011    Past Surgical History:  Procedure Laterality Date   ABDOMINAL AORTOGRAM W/LOWER EXTREMITY N/A 08/27/2020   Procedure: ABDOMINAL AORTOGRAM W/LOWER EXTREMITY;  Surgeon: Nigel Mormon, MD;  Location: [place] [facility];  Service: Cardiovascular;  Laterality: N/A;   BREAST EXCISIONAL BIOPSY Left    BREAST SURGERY  2008 and 2012   x 2 - benign, left side   carotid artery surgery Left    CESAREAN SECTION     x 3   COLONOSCOPY  multiple   2010   PERIPHERAL VASCULAR BALLOON ANGIOPLASTY  08/27/2020   Procedure: PERIPHERAL VASCULAR BALLOON ANGIOPLASTY;  Surgeon: Nigel Mormon, MD;  Location: [place] [facility];  Service: Cardiovascular;;  Right SFA scoring balloon   TRANSFORAMINAL LUMBAR INTERBODY FUSION (TLIF) WITH PEDICLE SCREW FIXATION 1 LEVEL Left 07/10/2020   Procedure: LEFT-SIDED LUMBAR FOUR-FIVE TRANSFORAMINAL LUMBAR INTERBODY FUSION WITH INSTRUMENTATION AND ALLOGRAFT;  Surgeon: Phylliss Bob, MD;  Location: [place];  Service: Orthopedics;   Laterality: Left;    Family History  Problem Relation Age of Onset   Hypertension Mother 42   Heart disease Mother    Heart failure Mother 79   Hypertension Brother    Colon cancer Brother 58   Dementia Maternal Grandmother    Breast cancer Cousin    Hyperlipidemia Other    Hypertension Other    Coronary artery disease Other     Social History   Tobacco Use   Smoking status: Former    Packs/day: 0.25    Types: Cigarettes    Quit date: 05/31/1979    Years since quitting: 43.4   Smokeless tobacco: Current    Types: Snuff   Tobacco comments:    social smoker  Substance Use Topics   Alcohol use: No    Comment: former alcholic    Subjective:   Follow up on change from Lisinopril to Losartan; notes is doing very well- cough resolved almost immediately; Denies any  chest pain, shortness of breath, blurred vision or headache Would like to get handicapped placard updated today as well due to chronic back issues;   Objective:  Vitals:   11/17/22 1155  BP: 136/78  Pulse: 75  Resp: 18  Temp: 98.5 F (36.9 C)  TempSrc: Oral  SpO2: 98%  Weight: 145 lb 3.2 oz (65.9 kg)  Height: _0  (1.499 m)    General: Well developed, well nourished, in no acute distress  Skin : Warm and dry.  Head: Normocephalic and atraumatic  Eyes: Sclera and conjunctiva clear; pupils round and reactive to light; extraocular movements intact  Ears: External normal; canals clear; tympanic membranes normal  Oropharynx: Pink, supple. No suspicious lesions  Neck: Supple without thyromegaly, adenopathy  Lungs: Respirations unlabored; clear to auscultation bilaterally without wheeze, rales, rhonchi  CVS exam: normal rate and regular rhythm.  Neurologic: Alert and oriented; speech intact; face symmetrical; moves all extremities well; CNII-XII intact without focal deficit   Assessment:  1. Essential hypertension     Plan:  Good improvement since changing from Lisinopril- cough has resolved; continue  Losartan HCT at current dosage; she has not taken her medication this morning; keep planned follow up with cardiology for early February 2024; check CMP to ensure creatinine has stabilized; follow up in 6 months, sooner prn.   No follow-ups on file.  Orders Placed This Encounter  Procedures   Comp Met (CMET)    Requested Prescriptions    No prescriptions requested or ordered in this encounter

## 2022-12-01 ENCOUNTER — Ambulatory Visit: Payer: Medicare PPO | Admitting: Psychology

## 2022-12-08 ENCOUNTER — Other Ambulatory Visit: Payer: Self-pay | Admitting: Family

## 2022-12-09 NOTE — Telephone Encounter (Signed)
Requesting: Ambien Contract: N/A UDS: N/A Last Visit: 11/17/2022 Next Visit: 01/12/2022 Last Refill: 09/01/2022  Please Advise

## 2022-12-10 NOTE — Telephone Encounter (Signed)
Patient checking on refill

## 2022-12-15 ENCOUNTER — Ambulatory Visit (INDEPENDENT_AMBULATORY_CARE_PROVIDER_SITE_OTHER): Payer: Medicare PPO | Admitting: Psychology

## 2022-12-15 DIAGNOSIS — F330 Major depressive disorder, recurrent, mild: Secondary | ICD-10-CM

## 2022-12-15 NOTE — Progress Notes (Signed)
PROGRESS NOTE:  Name: [name] [name] Date: 12/15/2022 MRN: [id number] DOB: 27-Dec-1950 PCP: Marrian Salvage, FNP  Time spent:  Start: 12:00 PM End:  12:55 PM   Today I met with [name] [name] in a remote video (WebEx) face to face individual psychotherapy.   Distance Site: Client's Home  Originating Site: Dr. 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 2) Increase the level of activity by identifying and engaging in values-consistent pleasurable activities. 3) Develop healthy thinking patterns and beliefs     Client Statement of Needs: I want to be happy and live a fuller life, enjoy being retired   Treatment Level: Biweekly Individual Outpatient Psychotherapy  Symptoms:Complains of generalized pain in many joints, muscles, and bones that debilitates normal functioning. Decrease or loss of appetite.  Depressed or irritable mood.  Exhibits signs and symptoms of depression. Experiences an increase in general physical discomfort (e.g., fatigue, insomnia, muscle tension, body aches). Experiences back, and leg pain Lack of energy.  Sleeplessness or hypersomnia.  Unresolved grief issues.   Client Treatment Preferences: female therapist   Healthcare consumer's goal for treatment:  Psychologist, Dr. Royetta [name], Ph.D. will support the patient's ability to achieve the goals identified. Cognitive Behavioral Therapy, Dialectical Behavioral Therapy, Motivational Interviewing, reminiscing and other evidenced-based  practices will be used to promote progress towards healthy functioning.   Healthcare consumer, [place] will: Actively participate in therapy, working towards healthy functioning.    *Justification for Continuation/Discontinuation of Goal: R=Revised, O=Ongoing, A=Achieved, D=Discontinued  Goal 1) Integrate and implement all new mental, somatic, and behavioral ways of managing pain.  Target Date Goal Was reviewed Status Code Progress towards goal/5 pt Likert rating  03/25/2023 03/24/2022          O 2/5             Goal 2) Develop healthy thinking patterns and beliefs about self, others, and the world that lead to  the alleviation and help prevent the relapse of depression.  Target Date Goal Was reviewed Status Code Progress towards goal/5 pt Likert rating  03/25/2023 03/24/2022          O 2/5             Goal 3) Increase the level and range of activity by identifying and engaging in values-consistent  pleasurable activities.  Target Date Goal Was reviewed Status Code Progress towards goal/5 pt Likert rating  03/25/2023 03/24/2022           O 1/5             This plan has been reviewed and created by the following participants:  This plan will be reviewed  at least every 12 months. Date Behavioral Health Clinician Date Guardian/Patient   03/24/2022 [name] [name], Ph.D.  03/24/2022 [name] [name]                    [name] [name] reports that she got through the holidays well.  It was hard around her mother's birthday.  They did a balloon release to remember her.  [name] [name]'s mood  appeared to be improved.  We d/e how spending time with her grandchildren makes her laugh and has her moving more as she tries to keep up with them, all of which help to improve her mood.  She admitted that she had a hard time on her mother's birthday but her children helped to make her feel better.  I provided support and guidance as needed.   [name] Crochet, PhD    Grandsons:  [place] (7) Cannon  (3) Crew (1)

## 2022-12-29 ENCOUNTER — Ambulatory Visit (INDEPENDENT_AMBULATORY_CARE_PROVIDER_SITE_OTHER): Payer: Medicare PPO | Admitting: Psychology

## 2022-12-29 DIAGNOSIS — F330 Major depressive disorder, recurrent, mild: Secondary | ICD-10-CM | POA: Diagnosis not present

## 2022-12-29 NOTE — Progress Notes (Signed)
PROGRESS NOTE:  Name: [name] [name] Date: 12/29/2022 MRN: [id number] DOB: 1951-07-01 PCP: Marrian Salvage, FNP  Time spent:  Start: 12:00 PM End:  12:55 PM   Today I met with [name] [name] in a remote video (WebEx) face to face individual psychotherapy.   Distance Site: Client's Home  Originating Site: Dr. 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 2) Increase the level of activity by identifying and engaging in values-consistent pleasurable activities. 3) Develop healthy thinking patterns and beliefs     Client Statement of Needs: I want to be happy and live a fuller life, enjoy being retired   Treatment Level: Biweekly Individual Outpatient Psychotherapy  Symptoms:Complains of generalized pain in many joints, muscles, and bones that debilitates normal functioning. Decrease or loss of appetite.  Depressed or irritable mood.  Exhibits signs and symptoms of depression. Experiences an increase in general physical discomfort (e.g., fatigue, insomnia, muscle tension, body aches). Experiences back, and leg pain Lack of energy.  Sleeplessness or hypersomnia.  Unresolved grief issues.   Client Treatment Preferences: female therapist   Healthcare consumer's goal for treatment:  Psychologist, Dr. Royetta Crochet, Ph.D. will support the patient's ability to achieve the goals identified. Cognitive Behavioral Therapy, Dialectical Behavioral Therapy, Motivational Interviewing, reminiscing and other  evidenced-based practices will be used to promote progress towards healthy functioning.   Healthcare consumer, [place] will: Actively participate in therapy, working towards healthy functioning.    *Justification for Continuation/Discontinuation of Goal: R=Revised, O=Ongoing, A=Achieved, D=Discontinued  Goal 1) Integrate and implement all new mental, somatic, and behavioral ways of managing pain.  Target Date Goal Was reviewed Status Code Progress towards goal/5 pt Likert rating  03/25/2023 03/24/2022          O 2/5             Goal 2) Develop healthy thinking patterns and beliefs about self, others, and the world that lead to  the alleviation and help prevent the relapse of depression.  Target Date Goal Was reviewed Status Code Progress towards goal/5 pt Likert rating  03/25/2023 03/24/2022          O 2/5             Goal 3) Increase the level and range of activity by identifying and engaging in values-consistent  pleasurable activities.  Target Date Goal Was reviewed Status Code Progress towards goal/5 pt Likert rating  03/25/2023 03/24/2022           O 1/5             This plan has been reviewed and created by the following participants:  This plan will be reviewed  at least every 12 months. Date Behavioral Health Clinician Date Guardian/Patient   03/24/2022 Royetta Crochet, Ph.D.  03/24/2022 [place] Ginger                    [name] [name] reports that she was "feeling lazy" today.  I had her tell why she thought she was feeling lazy instead of in need of rest.  She  rejected my attempts to normalize her experience.  We talked about self expectations and that often she can be hard on herself.  I encouraged her to be kind to herself.  I asked her to recognize that she needs to rest and manage her energy while she is caring for her grandchildren.    [name] [name] also states that she is ready to have her house to herself.  She shared a number of situations that repeatedly occur with her  daughter that is becoming irritating.  I provided the support she needed.   Royetta Crochet, PhD   Grandsons:  [place] (7) Cannon (3) Crew (1)

## 2023-01-10 ENCOUNTER — Other Ambulatory Visit: Payer: Self-pay | Admitting: Family

## 2023-01-12 ENCOUNTER — Other Ambulatory Visit: Payer: Self-pay | Admitting: Cardiology

## 2023-01-12 ENCOUNTER — Encounter: Payer: Self-pay | Admitting: Family

## 2023-01-12 ENCOUNTER — Ambulatory Visit: Payer: Medicare PPO | Admitting: Psychology

## 2023-01-12 ENCOUNTER — Ambulatory Visit: Payer: Medicare PPO | Admitting: Family

## 2023-01-12 ENCOUNTER — Other Ambulatory Visit: Payer: Self-pay | Admitting: Family

## 2023-01-12 VITALS — BP 132/78 | HR 94 | Ht 59.0 in | Wt 143.4 lb

## 2023-01-12 DIAGNOSIS — M25511 Pain in right shoulder: Secondary | ICD-10-CM

## 2023-01-12 DIAGNOSIS — R002 Palpitations: Secondary | ICD-10-CM

## 2023-01-12 DIAGNOSIS — I10 Essential (primary) hypertension: Secondary | ICD-10-CM

## 2023-01-12 DIAGNOSIS — I739 Peripheral vascular disease, unspecified: Secondary | ICD-10-CM

## 2023-01-12 DIAGNOSIS — E7800 Pure hypercholesterolemia, unspecified: Secondary | ICD-10-CM

## 2023-01-12 MED ORDER — LOSARTAN POTASSIUM-HCTZ 50-12.5 MG PO TABS: 1.0000 | ORAL_TABLET | Freq: Every day | ORAL | 3 refills | Status: DC

## 2023-01-12 NOTE — Progress Notes (Signed)
[name] [name] is a 72 y.o. female with the following history as recorded in [employer]:  Patient Active Problem List   Diagnosis Date Noted   Claudication in peripheral vascular disease ([place]) 08/26/2020   Neurogenic claudication 07/10/2020   Degenerative lumbar spinal stenosis 05/16/2020   Sacroiliac pain 08/10/2019   Anxiety and depression 05/11/2019   Chest pain 05/09/2019   Arthritis of sacroiliac joint of both sides 05/01/2019   Polyarthralgia 12/28/2018   Preventative health care 10/20/2018   RLS (restless legs syndrome) 09/27/2018   Insomnia disorder related to known organic factor 06/27/2018   PLMD (periodic limb movement disorder) 06/27/2018   Hot flashes due to menopause 04/25/2018   Palpitations 04/25/2018   Diaphoresis 04/25/2018   Long term current use of antithrombotics/antiplatelets 04/19/2018   Insomnia 04/19/2018   Intractable episodic headache 02/28/2018   Numbness and tingling of right arm 02/28/2018   Toe pain, right 10/19/2017   Rotator cuff arthropathy of left shoulder 06/02/2017   Trigger thumb of left hand 06/02/2017   Lipoma 05/06/2017   Pain of left thumb 05/06/2017   Chronic pain syndrome 12/15/2016   Lumbar degenerative disc disease 12/15/2016   Left leg pain 01/28/2016   Left shoulder pain 04/09/2015   Impaired glucose tolerance 07/30/2013   Abnormal breath sounds 07/30/2013   Asymptomatic bilateral carotid artery stenosis 05/05/2012   Anxiety 04/03/2011   CVA (cerebral infarction) 03/15/2011   HLD (hyperlipidemia) 11/03/2010   Hx of adenomatous colonic polyps 02/09/2008   Hypothyroidism 11/06/2007   ALLERGIC RHINITIS 11/06/2007   OSTEOPOROSIS 11/06/2007   ALCOHOL ABUSE, HX OF 11/06/2007   Essential hypertension 09/13/2007   Asthma 09/13/2007   PERIMENOPAUSAL STATUS 09/13/2007   Chronic insomnia 09/13/2007    Current Outpatient Medications  Medication Sig Dispense Refill   acetaminophen-codeine (TYLENOL #3) 300-30 MG tablet TAKE 1 TABLET  BY MOUTH TWICE A DAY AS NEEDED FOR MODERATE PAIN 30 tablet 0   acitretin (SORIATANE) 25 MG capsule Take 1 capsule by mouth daily.     aspirin EC 81 MG tablet Take 81 mg by mouth daily. Swallow whole.     Calcium Carbonate-Vit D-Min (CALCIUM 1200 PO) Take 1,200 mg by mouth daily.     Cholecalciferol (VITAMIN D) 50 MCG (2000 UT) tablet Take 2,000 Units by mouth daily.     citalopram (CELEXA) 20 MG tablet Take 1 tablet (20 mg total) by mouth daily. 90 tablet 3   clopidogrel (PLAVIX) 75 MG tablet TAKE 1 TABLET BY MOUTH EVERY DAY 90 tablet 3   diltiazem (CARDIZEM CD) 180 MG 24 hr capsule Take 1 capsule (180 mg total) by mouth daily. 90 capsule 3   ezetimibe (ZETIA) 10 MG tablet TAKE 1 TABLET (10 MG TOTAL) BY MOUTH DAILY AFTER SUPPER. 90 tablet 3   fluticasone-salmeterol (ADVAIR DISKUS) 250-50 MCG/ACT AEPB Inhale 1 puff into the lungs in the morning and at bedtime. 60 each 3   levothyroxine (SYNTHROID) 88 MCG tablet TAKE 1 TABLET BY MOUTH EVERY DAY BEFORE BREAKFAST 90 tablet 3   losartan-hydrochlorothiazide (HYZAAR) 50-12.5 MG tablet TAKE 1 TABLET BY MOUTH EVERY DAY 90 tablet 3   methocarbamol (ROBAXIN) 500 MG tablet Take 1 tablet (500 mg total) by mouth every 8 (eight) hours as needed. 30 tablet 2   metoprolol succinate (TOPROL-XL) 25 MG 24 hr tablet Take 1 tablet (25 mg total) by mouth daily. 90 tablet 3   montelukast (SINGULAIR) 10 MG tablet TAKE 1 TABLET BY MOUTH EVERY DAY 90 tablet 3   Multiple Vitamin (  MULTIVITAMIN WITH MINERALS) TABS tablet Take 1 tablet by mouth daily.     pantoprazole (PROTONIX) 40 MG tablet Take 1 tablet (40 mg total) by mouth 2 (two) times daily. 180 tablet 3   rOPINIRole (REQUIP) 0.5 MG tablet Take 1 tablet (0.5 mg total) by mouth at bedtime. 90 tablet 3   rosuvastatin (CRESTOR) 20 MG tablet Take 1 tablet (20 mg total) by mouth daily. 90 tablet 3   spironolactone (ALDACTONE) 25 MG tablet TAKE 1 TABLET BY MOUTH EVERY DAY IN THE MORNING 90 tablet 1   zolpidem (AMBIEN) 10 MG  tablet TAKE 1 TABLET (10 MG TOTAL) BY MOUTH AT BEDTIME AS NEEDED. FOR SLEEP 90 tablet 0   No current facility-administered medications for this visit.    Allergies: Atorvastatin, Fosamax [alendronate sodium], Lisinopril, Penicillins, and Trazodone and nefazodone  Past Medical History:  Diagnosis Date   Acid reflux    ALCOHOL ABUSE, HX OF 11/06/2007   ALLERGIC RHINITIS 11/06/2007   Anxiety 04/03/2011   ASTHMA 09/13/2007   Asthma    Chronic pain syndrome 12/15/2016   COLONIC POLYPS, HX OF 02/09/2008    ADENOMATOUS POLYP   Encounter for well adult exam without abnormal findings 04/03/2011   HYPERLIPIDEMIA 11/03/2010   HYPERTENSION 09/13/2007   HYPOTHYROIDISM 11/06/2007   Impaired glucose tolerance 07/30/2013   INSOMNIA, HX OF 09/13/2007   Lumbar degenerative disc disease 12/15/2016   OSTEOPOROSIS 11/06/2007   PERIMENOPAUSAL STATUS 09/13/2007   RLS (restless legs syndrome)    Stroke [facility])    TIA (transient ischemic attack) 11/04/2011    Past Surgical History:  Procedure Laterality Date   ABDOMINAL AORTOGRAM W/LOWER EXTREMITY N/A 08/27/2020   Procedure: ABDOMINAL AORTOGRAM W/LOWER EXTREMITY;  Surgeon: Nigel Mormon, MD;  Location: [place] [facility];  Service: Cardiovascular;  Laterality: N/A;   BREAST EXCISIONAL BIOPSY Left    BREAST SURGERY  2008 and 2012   x 2 - benign, left side   carotid artery surgery Left    CESAREAN SECTION     x 3   COLONOSCOPY  multiple   2010   PERIPHERAL VASCULAR BALLOON ANGIOPLASTY  08/27/2020   Procedure: PERIPHERAL VASCULAR BALLOON ANGIOPLASTY;  Surgeon: Nigel Mormon, MD;  Location: [place] [facility];  Service: Cardiovascular;;  Right SFA scoring balloon   TRANSFORAMINAL LUMBAR INTERBODY FUSION (TLIF) WITH PEDICLE SCREW FIXATION 1 LEVEL Left 07/10/2020   Procedure: LEFT-SIDED LUMBAR FOUR-FIVE TRANSFORAMINAL LUMBAR INTERBODY FUSION WITH INSTRUMENTATION AND ALLOGRAFT;  Surgeon: Phylliss Bob, MD;  Location: [place];  Service: Orthopedics;   Laterality: Left;    Family History  Problem Relation Age of Onset   Hypertension Mother 38   Heart disease Mother    Heart failure Mother 15   Hypertension Brother    Colon cancer Brother 4   Dementia Maternal Grandmother    Breast cancer Cousin    Hyperlipidemia Other    Hypertension Other    Coronary artery disease Other     Social History   Tobacco Use   Smoking status: Former    Packs/day: 0.25    Types: Cigarettes    Quit date: 05/31/1979    Years since quitting: 43.6   Smokeless tobacco: Current    Types: Snuff   Tobacco comments:    social smoker  Substance Use Topics   Alcohol use: No    Comment: former alcholic    Subjective:   Requesting referral to orthopedics; having worsening problems with right shoulder; notes that can feel it "popping"- limited ability  to move it; no numbness or tingling into fingertips;  Does need refill on her Losartan HCT as well; is feeling much better since stopping Lisinopril; cough has resolved completely; scheduled to see her cardiologist next month- notes he always does labs;   Objective:  Vitals:   01/12/23 1503  BP: 132/78  Pulse: 94  SpO2: 99%  Weight: 143 lb 6.4 oz (65 kg)  Height: '4\' 11"'$  (1.499 m)    General: Well developed, well nourished, in no acute distress  Skin : Warm and dry.  Head: Normocephalic and atraumatic  Eyes: Sclera and conjunctiva clear; pupils round and reactive to light; extraocular movements intact  Ears: External normal; canals clear; tympanic membranes normal  Oropharynx: Pink, supple. No suspicious lesions  Neck: Supple without thyromegaly, adenopathy  Lungs: Respirations unlabored;  CVS exam: normal rate and regular rhythm.  Musculoskeletal: No deformities; no active joint inflammation; crepitus noted in right shoulder Extremities: No edema, cyanosis, clubbing  Vessels: Symmetric bilaterally  Neurologic: Alert and oriented; speech intact; face symmetrical; moves all extremities well;  CNII-XII intact without focal deficit   Assessment:  1. Right shoulder pain, unspecified chronicity   2. Essential hypertension     Plan:  Refer to orthopedics as requested; Stable; continue same medications; CMP was checked last month; keep planned follow up with her cardiologist for next month who will also update labs; refill updated as requested; follow up in 6 months, sooner prn.   No follow-ups on file.  Orders Placed This Encounter  Procedures   Ambulatory referral to Orthopedic Surgery    Referral Priority:   Routine    Referral Type:   Surgical    Referral Reason:   Specialty Services Required    Requested Specialty:   Orthopedic Surgery    Number of Visits Requested:   1    Requested Prescriptions    No prescriptions requested or ordered in this encounter

## 2023-01-15 ENCOUNTER — Ambulatory Visit: Payer: Medicare PPO | Admitting: Sports Medicine

## 2023-01-20 ENCOUNTER — Ambulatory Visit (INDEPENDENT_AMBULATORY_CARE_PROVIDER_SITE_OTHER): Payer: Medicare PPO

## 2023-01-20 ENCOUNTER — Encounter: Payer: Self-pay | Admitting: Sports Medicine

## 2023-01-20 ENCOUNTER — Ambulatory Visit (INDEPENDENT_AMBULATORY_CARE_PROVIDER_SITE_OTHER): Payer: Medicare PPO | Admitting: Sports Medicine

## 2023-01-20 DIAGNOSIS — M25511 Pain in right shoulder: Secondary | ICD-10-CM | POA: Diagnosis not present

## 2023-01-20 DIAGNOSIS — G8929 Other chronic pain: Secondary | ICD-10-CM

## 2023-01-20 DIAGNOSIS — M19011 Primary osteoarthritis, right shoulder: Secondary | ICD-10-CM | POA: Diagnosis not present

## 2023-01-20 MED ORDER — METHYLPREDNISOLONE ACETATE 40 MG/ML IJ SUSP
40.0000 mg | INTRAMUSCULAR | Status: AC | PRN
  Administered 2023-01-20: 40 mg via INTRA_ARTICULAR

## 2023-01-20 MED ORDER — BUPIVACAINE HCL 0.25 % IJ SOLN
2.0000 mL | INTRAMUSCULAR | Status: AC | PRN
  Administered 2023-01-20: 2 mL via INTRA_ARTICULAR

## 2023-01-20 MED ORDER — LIDOCAINE HCL 1 % IJ SOLN
2.0000 mL | INTRAMUSCULAR | Status: AC | PRN
  Administered 2023-01-20: 2 mL

## 2023-01-20 NOTE — Progress Notes (Signed)
[name] [name] - 72 y.o. female MRN [id number]  Date of birth: 03/22/1951  Office Visit Note: Visit Date: 01/20/2023 PCP: Marrian Salvage, FNP Referred by: Marrian Salvage,*  Subjective: Chief Complaint  Patient presents with   Right Shoulder - Pain   HPI: [name] [name] is a pleasant 72 y.o. female who presents today for chronic right shoulder pain.  She has had pain for 3 years or more.  Feels pain deep within the shoulder.  Feels like a grating sensation when she moves it.  Range of motion.  Denies any any weakness.  At times she will get some stiffness.  She does take Tylenol 3 sparingly for pain.  She is also try topical pads and heating pad with only some relief.  At times when the changes that will go down the arm but she denies any numbness or tingling or radicular symptoms.  Pertinent ROS were reviewed with the patient and found to be negative unless otherwise specified above in HPI.   Assessment & Plan: Visit Diagnoses:  1. Chronic right shoulder pain   2. Primary osteoarthritis, right shoulder    Plan: Discussed with Artemisa the likely etiology of her chronic right shoulder pain, which seems to be an exacerbation of her underlying osteoarthritic change.  She does have somewhat advanced glenohumeral joint arthritis, although some of her pain could be emanating from rotator cuff arthropathy as well.  We discussed all treatment options such as formal physical therapy, home rehab, oral medication therapy, injection therapy.  She decided on home rehab, my athletic trainer did review the custom right shoulder home exercise plan which she demonstrated with the patient in the room today.  I do think she may benefit from a ultrasound-guided glenohumeral joint injection, although would start with a subacromial joint injection today, she tolerated well.  She will follow-up in about 1 month for reevaluation and we will reassess the shoulder.  If she does not get 75% or more  relief, may consider glenohumeral joint injection at that time.  She may continue Tylenol, heat/ice and her home rehab in the interim to maximize strength and support of the shoulder and rotator cuff tendons. F/u in 1 month.  Follow-up: Return in about 4 weeks (around 02/17/2023) for for right shoulder pain (30-mins for poss US-inj).   Meds & Orders: No orders of the defined types were placed in this encounter.   Orders Placed This Encounter  Procedures   Large Joint Inj: R subacromial bursa   XR Shoulder Right     Procedures: Large Joint Inj: R subacromial bursa on 01/20/2023 10:16 AM Indications: pain Details: 22 G 1.5 in needle, posterior approach Medications: 2 mL lidocaine 1 %; 2 mL bupivacaine 0.25 %; 40 mg methylPREDNISolone acetate 40 MG/ML Outcome: tolerated well, no immediate complications  Subacromial Joint Injection, Right Shoulder After discussion on risks/benefits/indications, informed verbal consent was obtained. A timeout was then performed. Patient was seated on table in exam room. The patient's shoulder was prepped with betadine and alcohol swabs and utilizing posterior approach a 22G, 1.5" needle was directed anteriorly and laterally into the patient's subacromial space was injected with 2:2:1 mixture of lidocaine:bupivicaine:depomedrol with appreciation of free-flowing of the injectate into the bursal space. Patient tolerated the procedure well without immediate complications.   Procedure, treatment alternatives, risks and benefits explained, specific risks discussed. Consent was given by the patient. Immediately prior to procedure a time out was called to verify the correct patient, procedure, equipment, support staff  and site/side marked as required. Patient was prepped and draped in the usual sterile fashion.          Clinical History: No specialty comments available.  She reports that she quit smoking about 43 years ago. Her smoking use included cigarettes. She  smoked an average of .25 packs per day. Her smokeless tobacco use includes snuff. No results for input(s): "HGBA1C", "LABURIC" in the last 8760 hours.  Objective:    Physical Exam  Gen: Well-appearing, in no acute distress; non-toxic CV: Well-perfused. Warm.  Resp: Breathing unlabored on room air; no wheezing. Psych: Fluid speech in conversation; appropriate affect; normal thought process Neuro: Sensation intact throughout. No gross coordination deficits.   Ortho Exam - Right shoulder: There is some mild tenderness to palpation palpating deep within the anterior shoulder joint.  No overlying AC joint TTP.  No overlying skin changes, erythema or effusion noted.  There is some mild restriction both active and passively with forward flexion to about 165 degrees, abduction to 130 degrees.  She has about 10 degrees loss of external rotation of the right shoulder compared to the left.  No gross weakness is with rotator cuff testing. NVI.  Imaging: XR Shoulder Right  Result Date: 01/20/2023 3 views of the right shoulder including Grashey, scapular Y, axial views were ordered and reviewed by myself.  X-rays demonstrate moderate to severe glenohumeral joint arthritic change.  There is moderate AC joint arthropathy with a downsloping acromion.  No acute fracture noted.   Past Medical/Family/Surgical/Social History: Medications & Allergies reviewed per EMR, new medications updated. Patient Active Problem List   Diagnosis Date Noted   Claudication in peripheral vascular disease ([place]) 08/26/2020   Neurogenic claudication 07/10/2020   Degenerative lumbar spinal stenosis 05/16/2020   Sacroiliac pain 08/10/2019   Anxiety and depression 05/11/2019   Chest pain 05/09/2019   Arthritis of sacroiliac joint of both sides 05/01/2019   Polyarthralgia 12/28/2018   Preventative health care 10/20/2018   RLS (restless legs syndrome) 09/27/2018   Insomnia disorder related to known organic factor 06/27/2018    PLMD (periodic limb movement disorder) 06/27/2018   Hot flashes due to menopause 04/25/2018   Palpitations 04/25/2018   Diaphoresis 04/25/2018   Long term current use of antithrombotics/antiplatelets 04/19/2018   Insomnia 04/19/2018   Intractable episodic headache 02/28/2018   Numbness and tingling of right arm 02/28/2018   Toe pain, right 10/19/2017   Rotator cuff arthropathy of left shoulder 06/02/2017   Trigger thumb of left hand 06/02/2017   Lipoma 05/06/2017   Pain of left thumb 05/06/2017   Chronic pain syndrome 12/15/2016   Lumbar degenerative disc disease 12/15/2016   Left leg pain 01/28/2016   Left shoulder pain 04/09/2015   Impaired glucose tolerance 07/30/2013   Abnormal breath sounds 07/30/2013   Asymptomatic bilateral carotid artery stenosis 05/05/2012   Anxiety 04/03/2011   CVA (cerebral infarction) 03/15/2011   HLD (hyperlipidemia) 11/03/2010   Hx of adenomatous colonic polyps 02/09/2008   Hypothyroidism 11/06/2007   ALLERGIC RHINITIS 11/06/2007   OSTEOPOROSIS 11/06/2007   ALCOHOL ABUSE, HX OF 11/06/2007   Essential hypertension 09/13/2007   Asthma 09/13/2007   PERIMENOPAUSAL STATUS 09/13/2007   Chronic insomnia 09/13/2007   Past Medical History:  Diagnosis Date   Acid reflux    ALCOHOL ABUSE, HX OF 11/06/2007   ALLERGIC RHINITIS 11/06/2007   Anxiety 04/03/2011   ASTHMA 09/13/2007   Asthma    Chronic pain syndrome 12/15/2016   COLONIC POLYPS, HX OF 02/09/2008  ADENOMATOUS POLYP   Encounter for well adult exam without abnormal findings 04/03/2011   HYPERLIPIDEMIA 11/03/2010   HYPERTENSION 09/13/2007   HYPOTHYROIDISM 11/06/2007   Impaired glucose tolerance 07/30/2013   INSOMNIA, HX OF 09/13/2007   Lumbar degenerative disc disease 12/15/2016   OSTEOPOROSIS 11/06/2007   PERIMENOPAUSAL STATUS 09/13/2007   RLS (restless legs syndrome)    Stroke [facility])    TIA (transient ischemic attack) 11/04/2011   Family History  Problem Relation Age of Onset   Hypertension  Mother 15   Heart disease Mother    Heart failure Mother 89   Hypertension Brother    Colon cancer Brother 97   Dementia Maternal Grandmother    Breast cancer Cousin    Hyperlipidemia Other    Hypertension Other    Coronary artery disease Other    Past Surgical History:  Procedure Laterality Date   ABDOMINAL AORTOGRAM W/LOWER EXTREMITY N/A 08/27/2020   Procedure: ABDOMINAL AORTOGRAM W/LOWER EXTREMITY;  Surgeon: Nigel Mormon, MD;  Location: [facility];  Service: Cardiovascular;  Laterality: N/A;   BREAST EXCISIONAL BIOPSY Left    BREAST SURGERY  2008 and 2012   x 2 - benign, left side   carotid artery surgery Left    CESAREAN SECTION     x 3   COLONOSCOPY  multiple   2010   PERIPHERAL VASCULAR BALLOON ANGIOPLASTY  08/27/2020   Procedure: PERIPHERAL VASCULAR BALLOON ANGIOPLASTY;  Surgeon: Nigel Mormon, MD;  Location: [facility];  Service: Cardiovascular;;  Right SFA scoring balloon   TRANSFORAMINAL LUMBAR INTERBODY FUSION (TLIF) WITH PEDICLE SCREW FIXATION 1 LEVEL Left 07/10/2020   Procedure: LEFT-SIDED LUMBAR FOUR-FIVE TRANSFORAMINAL LUMBAR INTERBODY FUSION WITH INSTRUMENTATION AND ALLOGRAFT;  Surgeon: Phylliss Bob, MD;  Location: [facility];  Service: Orthopedics;  Laterality: Left;   Social History   Occupational History   Occupation: Nurse, mental health: [employer]  Tobacco Use   Smoking status: Former    Packs/day: 0.25    Types: Cigarettes    Quit date: 05/31/1979    Years since quitting: 43.6   Smokeless tobacco: Current    Types: Snuff   Tobacco comments:    social smoker  Vaping Use   Vaping Use: Never used  Substance and Sexual Activity   Alcohol use: No    Comment: former alcholic   Drug use: Yes    Types: Marijuana   Sexual activity: Not on file

## 2023-01-20 NOTE — Progress Notes (Signed)
3+ years of pain No known injury She has not had any type of treatment for this shoulder; has used heat/ice with minimal relief She has prescription for Tylenol #3 that she uses sparingly for pain She complains of the shoulder popping when she moves it certain ways which causes pain  When pain is severe; it goes down into the arm and hand

## 2023-01-20 NOTE — Addendum Note (Signed)
Addended by: Renne Musca III on: 01/20/2023 12:00 PM   Modules accepted: Level of Service

## 2023-01-20 NOTE — Progress Notes (Signed)
Patient was instructed in 10 minutes of therapeutic exercises for right shoulder pain to improve strength, ROM and function according to my instructions and plan of care by a Certified Athletic Trainer during the office visit. A customized handout was provided and demonstration of proper technique shown and discussed. Patient did perform exercises and demonstrate understanding through teachback.  All questions discussed and answered.  

## 2023-01-21 ENCOUNTER — Other Ambulatory Visit: Payer: Self-pay | Admitting: Cardiology

## 2023-01-21 ENCOUNTER — Other Ambulatory Visit: Payer: Self-pay | Admitting: Family

## 2023-01-21 DIAGNOSIS — E7800 Pure hypercholesterolemia, unspecified: Secondary | ICD-10-CM

## 2023-01-21 NOTE — Telephone Encounter (Signed)
Requesting: Tylenol #3  Contract: None UDS: None Last Visit: 01/12/23 Next Visit: 07/13/23 Last Refill: 10/13/22 #30 and 0RF  Please Advise

## 2023-01-24 ENCOUNTER — Encounter: Payer: Self-pay | Admitting: Cardiology

## 2023-01-26 ENCOUNTER — Ambulatory Visit (INDEPENDENT_AMBULATORY_CARE_PROVIDER_SITE_OTHER): Payer: Medicare PPO | Admitting: Psychology

## 2023-01-26 DIAGNOSIS — F330 Major depressive disorder, recurrent, mild: Secondary | ICD-10-CM

## 2023-01-26 NOTE — Progress Notes (Signed)
PROGRESS NOTE:  Name: [name] [name] Date: 01/26/2023 MRN: [id number] DOB: 02/20/1951 PCP: Marrian Salvage, FNP  Time spent:  Start: 12:00 PM End:  12:55 PM   Today I met with [name] [name] in a remote video (WebEx) face to face individual psychotherapy.   Distance Site: Client's Home  Originating Site: Dr. 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 2) Increase the level of activity by identifying and engaging in values-consistent pleasurable activities. 3) Develop healthy thinking patterns and beliefs     Client Statement of Needs: I want to be happy and live a fuller life, enjoy being retired   Treatment Level: Biweekly Individual Outpatient Psychotherapy  Symptoms:Complains of generalized pain in many joints, muscles, and bones that debilitates normal functioning. Decrease or loss of appetite.  Depressed or irritable mood.  Exhibits signs and symptoms of depression. Experiences an increase in general physical discomfort (e.g., fatigue, insomnia, muscle tension, body aches). Experiences back, and leg pain Lack of energy.  Sleeplessness or hypersomnia.  Unresolved grief issues.   Client Treatment Preferences: female therapist   Healthcare consumer's goal for treatment:  Psychologist, Dr. Royetta Crochet, Ph.D. will support the patient's ability to achieve the goals identified. Cognitive Behavioral Therapy, Dialectical Behavioral Therapy, Motivational Interviewing, reminiscing and other  evidenced-based practices will be used to promote progress towards healthy functioning.   Healthcare consumer, [name] will: Actively participate in therapy, working towards healthy functioning.    *Justification for Continuation/Discontinuation of Goal: R=Revised, O=Ongoing, A=Achieved, D=Discontinued  Goal 1) Integrate and implement all new mental, somatic, and behavioral ways of managing pain.  Target Date Goal Was reviewed Status Code Progress towards goal/5 pt Likert rating  03/25/2023 03/24/2022          O 2/5             Goal 2) Develop healthy thinking patterns and beliefs about self, others, and the world that lead to  the alleviation and help prevent the relapse of depression.  Target Date Goal Was reviewed Status Code Progress towards goal/5 pt Likert rating  03/25/2023 03/24/2022          O 2/5             Goal 3) Increase the level and range of activity by identifying and engaging in values-consistent  pleasurable activities.  Target Date Goal Was reviewed Status Code Progress towards goal/5 pt Likert rating  03/25/2023 03/24/2022           O 1/5             This plan has been reviewed and created by the following participants:  This plan will be reviewed  at least every 12 months. Date Behavioral Health Clinician Date Guardian/Patient   03/24/2022 Royetta Crochet, Ph.D.  03/24/2022 [name] [name]                    [name] [name] reports that her adult children keep telling her that she needs to get out more.  They invite her over, but she says most of the time  she'd rather stay at home doing what she likes to do.  We d/ whether this was a good or bad thing.  After some d/e we decided that she needed some quiet time to herself when the grandchildren weren't in her care.  She is most relaxed and comfortable at home and need some time away from her children and grandchildren.   Royetta Crochet, PhD   Grandsons:  [place] (7) [place] (3) Crew (1)

## 2023-01-29 ENCOUNTER — Ambulatory Visit: Payer: Medicare PPO | Admitting: Cardiology

## 2023-01-29 ENCOUNTER — Encounter: Payer: Self-pay | Admitting: Cardiology

## 2023-01-29 VITALS — BP 91/51 | HR 64 | Resp 16 | Ht 59.0 in | Wt 140.6 lb

## 2023-01-29 DIAGNOSIS — I6523 Occlusion and stenosis of bilateral carotid arteries: Secondary | ICD-10-CM | POA: Diagnosis not present

## 2023-01-29 DIAGNOSIS — I739 Peripheral vascular disease, unspecified: Secondary | ICD-10-CM | POA: Diagnosis not present

## 2023-01-29 DIAGNOSIS — E7800 Pure hypercholesterolemia, unspecified: Secondary | ICD-10-CM

## 2023-01-29 DIAGNOSIS — I10 Essential (primary) hypertension: Secondary | ICD-10-CM

## 2023-01-29 DIAGNOSIS — I951 Orthostatic hypotension: Secondary | ICD-10-CM | POA: Diagnosis not present

## 2023-01-29 MED ORDER — LOSARTAN POTASSIUM 25 MG PO TABS: 25.0000 mg | ORAL_TABLET | Freq: Every evening | ORAL | 2 refills | Status: DC

## 2023-01-29 NOTE — Progress Notes (Signed)
Primary Physician/Referring:  Marrian Salvage, FNP  Patient ID: [name] [name], female    DOB: [birth year], 72 y.o.   MRN: [id number]  Chief Complaint  Patient presents with   PAD   Hypertension   Hyperlipidemia   Follow-up    1 year    HPI:    [name] [name]  is a 72 y.o. AA female  with history of left carotid endarterectomy due to symptomatic carotid stenosis in 2012 with TIA, asymptomatic right carotid stenosis, PAD with history of right SFA angioplasty, bronchial asthma, hypertension, hyperlipidemia, hyperglycemia.  She is still chewing tobacco.  Since last office visit 6 months ago she has lost about 18 pounds in weight.  Presently not having any symptoms of claudication, chest pain or dyspnea.  Since being on diltiazem, she has not had any palpitations. Patient reports symptoms of claudication are presently well controlled on current medical regimen.  Past Medical History:  Diagnosis Date   Acid reflux    ALCOHOL ABUSE, HX OF 11/06/2007   ALLERGIC RHINITIS 11/06/2007   Anxiety 04/03/2011   ASTHMA 09/13/2007   Asthma    Chronic pain syndrome 12/15/2016   COLONIC POLYPS, HX OF 02/09/2008    ADENOMATOUS POLYP   Encounter for well adult exam without abnormal findings 04/03/2011   HYPERLIPIDEMIA 11/03/2010   HYPERTENSION 09/13/2007   HYPOTHYROIDISM 11/06/2007   Impaired glucose tolerance 07/30/2013   INSOMNIA, HX OF 09/13/2007   Lumbar degenerative disc disease 12/15/2016   OSTEOPOROSIS 11/06/2007   PERIMENOPAUSAL STATUS 09/13/2007   RLS (restless legs syndrome)    Stroke [facility])    TIA (transient ischemic attack) 11/04/2011   Past Surgical History:  Procedure Laterality Date   ABDOMINAL AORTOGRAM W/LOWER EXTREMITY N/A 08/27/2020   Procedure: ABDOMINAL AORTOGRAM W/LOWER EXTREMITY;  Surgeon: Nigel Mormon, MD;  Location: [place] [facility];  Service: Cardiovascular;  Laterality: N/A;   BREAST EXCISIONAL BIOPSY Left    BREAST SURGERY  2008 and 2012   x 2 - benign,  left side   carotid artery surgery Left    CESAREAN SECTION     x 3   COLONOSCOPY  multiple   2010   PERIPHERAL VASCULAR BALLOON ANGIOPLASTY  08/27/2020   Procedure: PERIPHERAL VASCULAR BALLOON ANGIOPLASTY;  Surgeon: Nigel Mormon, MD;  Location: [place] [facility];  Service: Cardiovascular;;  Right SFA scoring balloon   TRANSFORAMINAL LUMBAR INTERBODY FUSION (TLIF) WITH PEDICLE SCREW FIXATION 1 LEVEL Left 07/10/2020   Procedure: LEFT-SIDED LUMBAR FOUR-FIVE TRANSFORAMINAL LUMBAR INTERBODY FUSION WITH INSTRUMENTATION AND ALLOGRAFT;  Surgeon: Phylliss Bob, MD;  Location: [place];  Service: Orthopedics;  Laterality: Left;   Social History   Tobacco Use   Smoking status: Former    Packs/day: 0.25    Types: Cigarettes    Quit date: 05/31/1979    Years since quitting: 43.6   Smokeless tobacco: Current    Types: Snuff   Tobacco comments:    social smoker  Substance Use Topics   Alcohol use: No    Comment: former alcholic   Marital Status: Divorced  ROS  Review of Systems  Cardiovascular:  Positive for claudication. Negative for chest pain, dyspnea on exertion, leg swelling and palpitations.  Gastrointestinal:  Negative for melena.   Objective  Blood pressure (!) 91/51, pulse 64, resp. rate 16, height 4' 11"$  (1.499 m), weight 140 lb 9.6 oz (63.8 kg), SpO2 97 %.     01/29/2023    2:26 PM 01/12/2023    3:03 PM 11/17/2022  11:55 AM  Vitals with BMI  Height 4' 11"$  4' 11"$  4' 11"$   Weight 140 lbs 10 oz 143 lbs 6 oz 145 lbs 3 oz  BMI 28.38 [id number]  Systolic 91 [id number]  Diastolic 51 78 78  Pulse 64 94 75    Orthostatic VS for the past 72 hrs (Last 3 readings):  Orthostatic BP Patient Position BP Location Cuff Size Orthostatic Pulse  01/29/23 1434 (!) 68/41 Standing Left Arm Normal 70  01/29/23 1432 (!) 88/52 Sitting Left Arm Normal 64  01/29/23 1431 92/77 Supine Left Arm Normal 63     Physical Exam Vitals reviewed.  Constitutional:      Comments: Short stature  Neck:      Vascular: Carotid bruit (faint right carotid bruit, Lt CEA Scar) present. No JVD.  Cardiovascular:     Rate and Rhythm: Normal rate and regular rhythm.     Pulses:          Popliteal pulses are 0 on the right side and 0 on the left side.       Dorsalis pedis pulses are 1+ on the right side and 1+ on the left side.       Posterior tibial pulses are 0 on the right side and 0 on the left side.     Heart sounds: S1 normal and S2 normal. No murmur heard.    No gallop.  Pulmonary:     Effort: Pulmonary effort is normal.     Breath sounds: Normal breath sounds.  Abdominal:     General: Abdomen is flat.     Palpations: Abdomen is soft.  Musculoskeletal:     Right lower leg: No edema.     Left lower leg: No edema.  Skin:    Capillary Refill: Capillary refill takes less than 2 seconds.    Laboratory examination:   Lab Results  Component Value Date   NA 137 11/17/2022   K 4.3 11/17/2022   CO2 29 11/17/2022   GLUCOSE 122 (H) 11/17/2022   BUN 21 11/17/2022   CREATININE 1.18 11/17/2022   CALCIUM 9.5 11/17/2022   EGFR 45 (L) 06/17/2021   GFRNONAA 70 08/08/2020       Latest Ref Rng & Units 11/17/2022   12:12 PM 10/13/2022    3:18 PM 03/10/2022    2:54 PM  CMP  Glucose 70 - 99 mg/dL 122  114  122   BUN 6 - 23 mg/dL 21  23  21   $ Creatinine 0.40 - 1.20 mg/dL 1.18  1.25  1.06   Sodium 135 - 145 mEq/L 137  137  137   Potassium 3.5 - 5.1 mEq/L 4.3  4.2  3.9   Chloride 96 - 112 mEq/L 99  102  102   CO2 19 - 32 mEq/L 29  27  27   $ Calcium 8.4 - 10.5 mg/dL 9.5  9.8  9.8   Total Protein 6.0 - 8.3 g/dL 6.6  7.1  6.7   Total Bilirubin 0.2 - 1.2 mg/dL 0.4  0.5  0.4   Alkaline Phos 39 - 117 U/L 94  91  110   AST 0 - 37 U/L 21  19  18   $ ALT 0 - 35 U/L 15  13  15       $ Latest Ref Rng & Units 10/13/2022    3:18 PM 03/10/2022    2:54 PM 12/09/2021    3:16 PM  CBC  WBC 4.0 - 10.5 K/uL 6.5  7.0  7.5   Hemoglobin 12.0 - 15.0 g/dL 12.2  12.5  11.9   Hematocrit 36.0 - 46.0 % 37.1  38.3  35.8    Platelets 150.0 - 400.0 K/uL 239.0  223.0  227.0    Lipid Panel Recent Labs    10/13/22 1518  CHOL 147  TRIG 85.0  LDLCALC 76  VLDL 17.0  HDL 54.40  CHOLHDL 3    HEMOGLOBIN A1C Lab Results  Component Value Date   HGBA1C 6.4 10/16/2019   MPG 128 (H) 03/04/2011   TSH Recent Labs    10/13/22 1518  TSH 0.71    Allergies   Allergies  Allergen Reactions   Atorvastatin Other (See Comments)    Arthralgia and myalgia   Benazepril Swelling   Lisinopril Cough   Fosamax [Alendronate Sodium] Nausea And Vomiting   Penicillins Hives    Did it involve swelling of the face/tongue/throat, SOB, or low BP? No Did it involve sudden or severe rash/hives, skin peeling, or any reaction on the inside of your mouth or nose? Yes Did you need to seek medical attention at a hospital or doctor's office? Yes When did it last happen? Over 10 years ago If all above answers are "NO", may proceed with cephalosporin use.   Trazodone And Nefazodone      Final Medications at End of Visit     Current Outpatient Medications:    acetaminophen-codeine (TYLENOL #3) 300-30 MG tablet, TAKE 1 TABLET BY MOUTH TWICE A DAY AS NEEDED FOR MODERATE PAIN, Disp: 30 tablet, Rfl: 0   acitretin (SORIATANE) 25 MG capsule, Take 1 capsule by mouth daily., Disp: , Rfl:    Calcium Carbonate-Vit D-Min (CALCIUM 1200 PO), Take 1,200 mg by mouth daily., Disp: , Rfl:    Cholecalciferol (VITAMIN D) 50 MCG (2000 UT) tablet, Take 2,000 Units by mouth daily., Disp: , Rfl:    citalopram (CELEXA) 20 MG tablet, Take 1 tablet (20 mg total) by mouth daily., Disp: 90 tablet, Rfl: 3   clopidogrel (PLAVIX) 75 MG tablet, TAKE 1 TABLET BY MOUTH EVERY DAY, Disp: 90 tablet, Rfl: 3   diltiazem (CARDIZEM CD) 180 MG 24 hr capsule, TAKE 1 CAPSULE BY MOUTH EVERY DAY, Disp: 90 capsule, Rfl: 3   ezetimibe (ZETIA) 10 MG tablet, TAKE 1 TABLET BY MOUTH DAILY AFTER SUPPER, Disp: 90 tablet, Rfl: 3   fluticasone-salmeterol (ADVAIR DISKUS) 250-50 MCG/ACT  AEPB, Inhale 1 puff into the lungs in the morning and at bedtime., Disp: 60 each, Rfl: 3   levothyroxine (SYNTHROID) 88 MCG tablet, TAKE 1 TABLET BY MOUTH EVERY DAY BEFORE BREAKFAST, Disp: 90 tablet, Rfl: 3   losartan (COZAAR) 25 MG tablet, Take 1 tablet (25 mg total) by mouth every evening., Disp: 30 tablet, Rfl: 2   methocarbamol (ROBAXIN) 500 MG tablet, Take 1 tablet (500 mg total) by mouth every 8 (eight) hours as needed., Disp: 30 tablet, Rfl: 2   montelukast (SINGULAIR) 10 MG tablet, TAKE 1 TABLET BY MOUTH EVERY DAY, Disp: 90 tablet, Rfl: 3   Multiple Vitamin (MULTIVITAMIN WITH MINERALS) TABS tablet, Take 1 tablet by mouth daily., Disp: , Rfl:    pantoprazole (PROTONIX) 40 MG tablet, Take 1 tablet (40 mg total) by mouth 2 (two) times daily., Disp: 180 tablet, Rfl: 3   rOPINIRole (REQUIP) 0.5 MG tablet, Take 1 tablet (0.5 mg total) by mouth at bedtime., Disp: 90 tablet, Rfl: 3   rosuvastatin (CRESTOR) 20 MG tablet, TAKE 1 TABLET BY MOUTH EVERY DAY, Disp: 90 tablet,  Rfl: 3   zolpidem (AMBIEN) 10 MG tablet, TAKE 1 TABLET (10 MG TOTAL) BY MOUTH AT BEDTIME AS NEEDED. FOR SLEEP, Disp: 90 tablet, Rfl: 0    Radiology:   No results found.  Cardiac Studies:   PERIPHERAL VASC BALLOON ANGIOPLASTY/ABD AORTOGRAM W/LOWER EXTREMITY 08/27/2020:  Rt SFA prox 70% diffuse, mid 95% focal stenosis Left SFA mid 20% stenosis Bilateral internal iliac ostial 50% stenosis, followed by small aneurysmal areas Three vessel runoff below the knee b/l Successful PTCA right SFA using chocolate balloon 5.0X120 mm 0% residual stenosis  Carotid artery duplex 05/16/2021: Stenosis in the right internal carotid artery (50-69%). Stenosis in the right external carotid artery (<50%). Right ICA could not be visualized due to patient discomfort. Antegrade right vertebral artery flow. H/O left carotid endarterectomy, patent by study  dated 04/22/2020, there is also mild progression of disease severity in the right ICA from  <50%. Follow up in six months is appropriate if clinically indicated.  Lower Extremity Arterial Duplex 07/16/2021: RIght mid, distal SFA and proximal popliteal artery appears occluded with collateral flow. Left distal SFA appears to be occluded.   This exam reveals mildly decreased perfusion of the right lower extremity, noted at the anterior tibial and post tibial artery level (ABI 0.9) and mildly decreased perfusion of the left lower extremity, noted at the post tibial artery level (ABI 0.9). No significant change from 04/22/2020.  PCV MYOCARDIAL PERFUSION WITH LEXISCAN 01/05/2022  Narrative Lexiscan Nuclear stress test 01/06/2021: Nondiagnostic ECG stress due to pharmacologic stress.  7/10 chest pian without EKG changes. Myocardial perfusion is normal. TID is mildly abnormal at 1.25. Overall LV systolic function is normal without regional wall motion abnormalities. Stress LV EF: 69%. Compared to previous study: 2019/06/26, Chest pain with Lexiscan infusion and TID now noted previously not present. Intermediate risk study.  PCV ECHOCARDIOGRAM COMPLETE 01/05/2022  Narrative Echocardiogram 01/05/2022: Left ventricle cavity is normal in size and wall thickness. Normal global wall motion. Normal LV systolic function with EF 55%. Normal diastolic filling pattern. Calculated EF 55%. Trileaflet aortic valve with mild aortic valve leaflet calcification. No significant stenosis or regurgitation. Mild mitral valve leaflet calcification. Mild to moderate mitral regurgitation. Normal right atrial pressure.     EKG:   EKG 01/29/2023: Normal sinus rhythm with rate of 65 bpm, left atrial enlargement, normal axis.  No evidence of ischemia, normal QT interval.  Compared to 01/30/2022, no significant change, previous heart rate was 90 bpm.  Assessment     ICD-10-CM   1. Claudication in peripheral vascular disease (HCC)  I73.9 EKG 12-Lead    2. Hypercholesteremia  E78.00     3. Asymptomatic  bilateral carotid artery stenosis  I65.23     4. Primary hypertension  I10 losartan (COZAAR) 25 MG tablet    5. Orthostatic hypotension  I95.1       Meds ordered this encounter  Medications   losartan (COZAAR) 25 MG tablet    Sig: Take 1 tablet (25 mg total) by mouth every evening.    Dispense:  30 tablet    Refill:  2    Please discontinue spironolactone 25 mg daily, metoprolol succinate 25 mg daily, losartan HCT 50/12.5 mg daily.     Medications Discontinued During This Encounter  Medication Reason   losartan-hydrochlorothiazide (HYZAAR) 50-12.5 MG tablet Discontinued by provider   metoprolol succinate (TOPROL-XL) 25 MG 24 hr tablet Discontinued by provider   spironolactone (ALDACTONE) 25 MG tablet Discontinued by provider   aspirin EC 81 MG tablet  Discontinued by provider    Recommendations:   [name] [name]  is a  72 y.o. AA female  with history of left carotid endarterectomy due to symptomatic carotid stenosis in 2012 with TIA, asymptomatic right carotid stenosis, PAD with history of right SFA angioplasty, bronchial asthma, hypertension, hyperlipidemia, hyperglycemia.  She is still chewing tobacco.  Since last office visit she is lost 18 pounds in weight.  1. Claudication in peripheral vascular disease ([place]) Symptoms of claudication remained stable on aspirin and Plavix, I will discontinue aspirin to reduce the risk of bleeding.  She is presently 72 years of age and over the past 2 years has remained stable without CLI or need for revascularization.  2. Hypercholesteremia Lipids are under excellent control, I reviewed her external labs.  No change in statin therapy was performed today.  3. Asymptomatic bilateral carotid artery stenosis She does have moderate right carotid disease, left carotid endarterectomy is widely patent, she needs surveillance duplex.  4. Primary hypertension Previously was hypertensive, with weight loss she has significantly reduced her blood  pressure and also she was severely orthostatic although asymptomatic.  I have discontinued metoprolol succinate 25 mg daily, spironolactone 25 mg daily, losartan HCT 50/12.5 mg daily and switch her to plain losartan 25 mg in the evening, will continue diltiazem CD1 80 mg daily for her palpitations and hypertension.  I will see her back in 6 weeks and repeat orthostatic blood pressure.  She is advised to bring all her medications to her next office visit.  5. Orthostatic hypotension She was severely orthostatic today as dictated above.  I have made changes to her medications.  She will be followed by me in 6 weeks and will address both blood pressure issues, orthostasis and will review carotid duplex.  I have also encouraged her to quit chewing tobacco.   Adrian Prows, MD, [facility] 01/29/2023, 3:06 PM Office: [phone] Fax: [phone] Pager: [phone]

## 2023-02-04 ENCOUNTER — Encounter: Payer: Self-pay | Admitting: Cardiology

## 2023-02-09 ENCOUNTER — Ambulatory Visit (INDEPENDENT_AMBULATORY_CARE_PROVIDER_SITE_OTHER): Payer: Medicare PPO | Admitting: Psychology

## 2023-02-09 DIAGNOSIS — F3341 Major depressive disorder, recurrent, in partial remission: Secondary | ICD-10-CM

## 2023-02-09 NOTE — Progress Notes (Signed)
PROGRESS NOTE:  Name: [name] [name] Date: 02/09/2023 MRN: [id number] DOB: January 02, 1951 PCP: Marrian Salvage, FNP  Time spent:  Start: 12:00 PM End:  12:55 PM   Today I met with Nekita B Finchum in a remote video (WebEx) face to face individual psychotherapy.   Distance Site: Client's Home  Originating Site: Dr. 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 2) Increase the level of activity by identifying and engaging in values-consistent pleasurable activities. 3) Develop healthy thinking patterns and beliefs     Client Statement of Needs: I want to be happy and live a fuller life, enjoy being retired   Treatment Level: Biweekly Individual Outpatient Psychotherapy  Symptoms:Complains of generalized pain in many joints, muscles, and bones that debilitates normal functioning. Decrease or loss of appetite.  Depressed or irritable mood.  Exhibits signs and symptoms of depression. Experiences an increase in general physical discomfort (e.g., fatigue, insomnia, muscle tension, body aches). Experiences back, and leg pain Lack of energy.  Sleeplessness or hypersomnia.  Unresolved grief issues.   Client Treatment Preferences: female therapist   Healthcare consumer's goal for treatment:  Psychologist, Dr. Royetta Crochet, Ph.D. will support the patient's ability to achieve the goals identified. Cognitive Behavioral Therapy, Dialectical Behavioral Therapy, Motivational Interviewing, reminiscing and other  evidenced-based practices will be used to promote progress towards healthy functioning.   Healthcare consumer, [place] will: Actively participate in therapy, working towards healthy functioning.    *Justification for Continuation/Discontinuation of Goal: R=Revised, O=Ongoing, A=Achieved, D=Discontinued  Goal 1) Integrate and implement all new mental, somatic, and behavioral ways of managing pain.  Target Date Goal Was reviewed Status Code Progress towards goal/5 pt Likert rating  03/25/2023 03/24/2022          O 2/5             Goal 2) Develop healthy thinking patterns and beliefs about self, others, and the world that lead to  the alleviation and help prevent the relapse of depression.  Target Date Goal Was reviewed Status Code Progress towards goal/5 pt Likert rating  03/25/2023 03/24/2022          O 2/5             Goal 3) Increase the level and range of activity by identifying and engaging in values-consistent  pleasurable activities.  Target Date Goal Was reviewed Status Code Progress towards goal/5 pt Likert rating  03/25/2023 03/24/2022           O 1/5             This plan has been reviewed and created by the following participants:  This plan will be reviewed  at least every 12 months. Date Behavioral Health Clinician Date Guardian/Patient   03/24/2022 Royetta Crochet, Ph.D.  03/24/2022 [place]                    Wylene reports that her daughter-in-law was able to find a provider to pull her teeth (11) and fit her for dentures within a budget she could afford.  She shared that she has high anxiety around dentists.  We d/e/p her early experiences that contributed to her anxiety and how it kept her from taking better care of her teeth.  We also d/p a highly charged incident with her daughter, what occurred, how she responded and how things were resolved.  Lastly, we reviewed our appointment schedule to ensure no appointment needed to be cancelled or  rescheduled.    Royetta Crochet, PhD   Grandsons:  [place] (7) [place] (3) Crew (1)

## 2023-02-16 ENCOUNTER — Telehealth: Payer: Self-pay | Admitting: Family

## 2023-02-16 ENCOUNTER — Other Ambulatory Visit: Payer: Self-pay | Admitting: Family

## 2023-02-16 MED ORDER — ACETAMINOPHEN-CODEINE 300-30 MG PO TABS: ORAL_TABLET | ORAL | 0 refills | Status: DC

## 2023-02-16 NOTE — Telephone Encounter (Signed)
Patient said her pharmacy has been out of the acetaminophen-codeine (TYLENOL #3) 300-30 MG tablet   For two months but the [facility] on [address] has the medication. Pt said the last fill was only for 30 days but she usually gets 90 day supply and she is completely out. Pt would like a call back to advise if prescription has been sent.

## 2023-02-17 ENCOUNTER — Ambulatory Visit: Payer: Medicare PPO | Admitting: Sports Medicine

## 2023-02-17 ENCOUNTER — Encounter: Payer: Self-pay | Admitting: Sports Medicine

## 2023-02-17 ENCOUNTER — Ambulatory Visit: Payer: Self-pay

## 2023-02-17 DIAGNOSIS — M19011 Primary osteoarthritis, right shoulder: Secondary | ICD-10-CM | POA: Diagnosis not present

## 2023-02-17 DIAGNOSIS — M25511 Pain in right shoulder: Secondary | ICD-10-CM

## 2023-02-17 DIAGNOSIS — G8929 Other chronic pain: Secondary | ICD-10-CM

## 2023-02-17 MED ORDER — BUPIVACAINE HCL 0.25 % IJ SOLN
2.0000 mL | INTRAMUSCULAR | Status: AC | PRN
  Administered 2023-02-17: 2 mL via INTRA_ARTICULAR

## 2023-02-17 MED ORDER — LIDOCAINE HCL 1 % IJ SOLN
2.0000 mL | INTRAMUSCULAR | Status: AC | PRN
  Administered 2023-02-17: 2 mL

## 2023-02-17 MED ORDER — METHYLPREDNISOLONE ACETATE 40 MG/ML IJ SUSP
80.0000 mg | INTRAMUSCULAR | Status: AC | PRN
  Administered 2023-02-17: 80 mg via INTRA_ARTICULAR

## 2023-02-17 NOTE — Progress Notes (Signed)
[name] [name] - 72 y.o. female MRN [id number]  Date of birth: 18-Jul-1951  Office Visit Note: Visit Date: 02/17/2023 PCP: Marrian Salvage, FNP Referred by: Marrian Salvage,*  Subjective: Chief Complaint  Patient presents with   Right Shoulder - Follow-up   HPI: [name] [name] is a pleasant 72 y.o. female who presents today for chronic right shoulder pain.  R shoulder pain for 3+ years Last visit on 01/20/23 did have SAJ injection - helped for 1 month, now over last week or so has worn off.  She is in a lot of pain, deep within shoulder.  Also feels a grating sensation to range of motion. Pain with reaching motions as well. She has been doing the home exercises, but as her injection wore off her pain has prevented her from performing home exercises. Uses Tylenol and Tylenol 3 as needed for pain.  Pertinent ROS were reviewed with the patient and found to be negative unless otherwise specified above in HPI.   Assessment & Plan: Visit Diagnoses:  1. Chronic right shoulder pain   2. Primary osteoarthritis, right shoulder    Plan: Discussed with Margretta that her chronic right shoulder pain seems to be emanating possibly from both the glenohumeral joint arthritis as well as rotator cuff arthropathy.  She did get rather decent relief from the subacromial joint injection, however only lasted for about 1 month.  Given the degree of her arthritis of the right shoulder, through shared decision making we elected to proceed with ultrasound-guided glenohumeral joint injection.  She is limited on NSAID use given her Plavix use.  She may continue with topical medications as well as Tylenol.  She will ice for any postinjection pain.  As her pain improves over the few days she will restart her home exercise program.  I would like her to follow-up in 1 month to reevaluate.  Hopefully she has improvement in her pain, however if she does not or she is still unable to do the home exercise  therapy, next step would be likely considering MRI of the right shoulder.  Follow-up: Return in about 1 month (around 03/20/2023) for for right shoulder.   Meds & Orders: No orders of the defined types were placed in this encounter.   Orders Placed This Encounter  Procedures   Large Joint Inj   US Guided Needle Placement - No Linked Charges     Procedures: Large Joint Inj: R glenohumeral on 02/17/2023 10:18 AM Indications: pain Details: 22 G 3.5 in needle, ultrasound-guided posterior approach Medications: 2 mL lidocaine 1 %; 2 mL bupivacaine 0.25 %; 80 mg methylPREDNISolone acetate 40 MG/ML Outcome: tolerated well, no immediate complications  US-guided glenohumeral joint injection, right shoulder After discussion on risks/benefits/indications, informed verbal consent was obtained. A timeout was then performed. The patient was positioned lying lateral recumbent on examination table. The patient's shoulder was prepped with betadine and multiple alcohol swabs and utilizing ultrasound guidance, the patient's glenohumeral joint was identified on ultrasound. Using ultrasound guidance a 22-gauge, 3.5 inch needle with a mixture of 2:2:2 cc's lidocaine:bupivicaine:depomedrol was directed from a lateral to medial direction via in-plane technique into the glenohumeral joint with visualization of appropriate spread of injectate into the joint. Patient tolerated the procedure well without immediate complications.      Procedure, treatment alternatives, risks and benefits explained, specific risks discussed. Consent was given by the patient. Immediately prior to procedure a time out was called to verify the correct patient, procedure, equipment, support staff  and site/side marked as required. Patient was prepped and draped in the usual sterile fashion.          Clinical History: No specialty comments available.  She reports that she quit smoking about 43 years ago. Her smoking use included cigarettes.  She smoked an average of .25 packs per day. Her smokeless tobacco use includes snuff. No results for input(s): "HGBA1C", "LABURIC" in the last 8760 hours.  Objective:    Physical Exam  Gen: Well-appearing, in no acute distress; non-toxic CV:  Well-perfused. Warm.  Resp: Breathing unlabored on room air; no wheezing. Psych: Fluid speech in conversation; appropriate affect; normal thought process Neuro: Sensation intact throughout. No gross coordination deficits.   Ortho Exam  - Right shoulder: There is some TTP palpating within the anterior shoulder joint as well as pain over Codman's point.  No AC joint TTP.  No overlying skin changes.  There are some mild restriction to both active and passive forward flexion with a block about 165 degrees, abduction 125 degrees.  There is about 10 degrees less of external rotation of the right shoulder compared to the left.  There is a painful drop arm test today. +Pain with resisted ER. NVI.  Imaging: X-rays last visit: XR Shoulder Right 3 views of the right shoulder including Grashey, scapular Y, axial views  were ordered and reviewed by myself.  X-rays demonstrate moderate to  severe glenohumeral joint arthritic change.  There is moderate AC joint  arthropathy with a downsloping acromion.  No acute fracture noted.   Past Medical/Family/Surgical/Social History: Medications & Allergies reviewed per EMR, new medications updated. Patient Active Problem List   Diagnosis Date Noted   Claudication in peripheral vascular disease ([place]) 08/26/2020   Neurogenic claudication 07/10/2020   Degenerative lumbar spinal stenosis 05/16/2020   Sacroiliac pain 08/10/2019   Anxiety and depression 05/11/2019   Chest pain 05/09/2019   Arthritis of sacroiliac joint of both sides 05/01/2019   Polyarthralgia 12/28/2018   Preventative health care 10/20/2018   RLS (restless legs syndrome) 09/27/2018   Insomnia disorder related to known organic factor 06/27/2018   PLMD  (periodic limb movement disorder) 06/27/2018   Hot flashes due to menopause 04/25/2018   Palpitations 04/25/2018   Diaphoresis 04/25/2018   Long term current use of antithrombotics/antiplatelets 04/19/2018   Insomnia 04/19/2018   Intractable episodic headache 02/28/2018   Numbness and tingling of right arm 02/28/2018   Toe pain, right 10/19/2017   Rotator cuff arthropathy of left shoulder 06/02/2017   Trigger thumb of left hand 06/02/2017   Lipoma 05/06/2017   Pain of left thumb 05/06/2017   Chronic pain syndrome 12/15/2016   Lumbar degenerative disc disease 12/15/2016   Left leg pain 01/28/2016   Left shoulder pain 04/09/2015   Impaired glucose tolerance 07/30/2013   Abnormal breath sounds 07/30/2013   Asymptomatic bilateral carotid artery stenosis 05/05/2012   Anxiety 04/03/2011   CVA (cerebral infarction) 03/15/2011   HLD (hyperlipidemia) 11/03/2010   Hx of adenomatous colonic polyps 02/09/2008   Hypothyroidism 11/06/2007   ALLERGIC RHINITIS 11/06/2007   OSTEOPOROSIS 11/06/2007   ALCOHOL ABUSE, HX OF 11/06/2007   Essential hypertension 09/13/2007   Asthma 09/13/2007   PERIMENOPAUSAL STATUS 09/13/2007   Chronic insomnia 09/13/2007   Past Medical History:  Diagnosis Date   Acid reflux    ALCOHOL ABUSE, HX OF 11/06/2007   ALLERGIC RHINITIS 11/06/2007   Anxiety 04/03/2011   ASTHMA 09/13/2007   Asthma    Chronic pain syndrome 12/15/2016   COLONIC  POLYPS, HX OF 02/09/2008    ADENOMATOUS POLYP   Encounter for well adult exam without abnormal findings 04/03/2011   HYPERLIPIDEMIA 11/03/2010   HYPERTENSION 09/13/2007   HYPOTHYROIDISM 11/06/2007   Impaired glucose tolerance 07/30/2013   INSOMNIA, HX OF 09/13/2007   Lumbar degenerative disc disease 12/15/2016   OSTEOPOROSIS 11/06/2007   PERIMENOPAUSAL STATUS 09/13/2007   RLS (restless legs syndrome)    Stroke [facility])    TIA (transient ischemic attack) 11/04/2011   Family History  Problem Relation Age of Onset   Hypertension Mother  15   Heart disease Mother    Heart failure Mother 56   Hypertension Brother    Colon cancer Brother 26   Dementia Maternal Grandmother    Breast cancer Cousin    Hyperlipidemia Other    Hypertension Other    Coronary artery disease Other    Past Surgical History:  Procedure Laterality Date   ABDOMINAL AORTOGRAM W/LOWER EXTREMITY N/A 08/27/2020   Procedure: ABDOMINAL AORTOGRAM W/LOWER EXTREMITY;  Surgeon: Nigel Mormon, MD;  Location: [place] [facility];  Service: Cardiovascular;  Laterality: N/A;   BREAST EXCISIONAL BIOPSY Left    BREAST SURGERY  2008 and 2012   x 2 - benign, left side   carotid artery surgery Left    CESAREAN SECTION     x 3   COLONOSCOPY  multiple   2010   PERIPHERAL VASCULAR BALLOON ANGIOPLASTY  08/27/2020   Procedure: PERIPHERAL VASCULAR BALLOON ANGIOPLASTY;  Surgeon: Nigel Mormon, MD;  Location: [place] [facility];  Service: Cardiovascular;;  Right SFA scoring balloon   TRANSFORAMINAL LUMBAR INTERBODY FUSION (TLIF) WITH PEDICLE SCREW FIXATION 1 LEVEL Left 07/10/2020   Procedure: LEFT-SIDED LUMBAR FOUR-FIVE TRANSFORAMINAL LUMBAR INTERBODY FUSION WITH INSTRUMENTATION AND ALLOGRAFT;  Surgeon: Phylliss Bob, MD;  Location: [place];  Service: Orthopedics;  Laterality: Left;   Social History   Occupational History   Occupation: Nurse, mental health: [employer]  Tobacco Use   Smoking status: Former    Packs/day: 0.25    Types: Cigarettes    Quit date: 05/31/1979    Years since quitting: 43.7   Smokeless tobacco: Current    Types: Snuff   Tobacco comments:    social smoker  Vaping Use   Vaping Use: Never used  Substance and Sexual Activity   Alcohol use: No    Comment: former alcholic   Drug use: Not Currently    Types: Marijuana    Comment: Quit 1975   Sexual activity: Not on file

## 2023-02-17 NOTE — Progress Notes (Signed)
In a lot of pain States the injection wore off about a week or so ago Having a hard time lifting that arm due to pain

## 2023-02-17 NOTE — Telephone Encounter (Signed)
Spoke with pt, pt states she had been experiencing a lot of pain in her back and shoulder and that is why she had been taken the tylenol, pt states she went to the Dr 02/17/2023 and got an injection in her shoulder and she's hoping that will help. Pt also states she does not need pain management and a 30 day supply is completely fine and she would like to thank her PCP for everything.

## 2023-02-18 ENCOUNTER — Other Ambulatory Visit: Payer: Medicare PPO

## 2023-02-23 ENCOUNTER — Ambulatory Visit (INDEPENDENT_AMBULATORY_CARE_PROVIDER_SITE_OTHER): Payer: Medicare PPO | Admitting: Psychology

## 2023-02-23 DIAGNOSIS — F3341 Major depressive disorder, recurrent, in partial remission: Secondary | ICD-10-CM

## 2023-02-23 NOTE — Progress Notes (Signed)
PROGRESS NOTE: Annual Review  Name: Magdalina Keiner Mays Date: 02/23/2023 MRN: [id number] DOB: 1951/08/16 PCP: Marrian Salvage, FNP  Time spent:  Start: 12:00 PM End:  12:55 PM   Today I met with Kameria B Mineo in a remote video (WebEx) face to face individual psychotherapy.   Distance Site: Client's Home  Originating Site: Dr. Jannifer Franklin Remote Office  Consent: Obtained verbal consent to transmit session remotely     Individualized Treatment Plan Strengths: verbal, motivated  Supports: family, spiritual beliefs   Goal/Needs for Treatment:  In order of importance to patient 1) Integrate and implement strategies and skills to better manage pain. 2) Increase the level of activity by identifying and engaging in values-consistent pleasurable activities. 3) Develop healthy thinking patterns and beliefs     Client Statement of Needs: I want to be happy and live a fuller life, enjoy being retired   Treatment Level: Biweekly Individual Outpatient Psychotherapy  Symptoms:Complains of generalized pain in many joints, muscles, and bones that debilitates normal functioning. Decrease or loss of appetite.  Depressed or irritable mood.  Exhibits signs and symptoms of depression. Experiences an increase in general physical discomfort (e.g., fatigue, insomnia, muscle tension, body aches). Experiences back, and leg pain Lack of energy.  Sleeplessness or hypersomnia.  Unresolved grief issues.   Client Treatment Preferences: female therapist   Healthcare consumer's goal for treatment:  Psychologist, Dr. Royetta Crochet, Ph.D. will support the patient's ability to achieve the goals identified. Cognitive Behavioral Therapy, Dialectical Behavioral Therapy, Motivational Interviewing, reminiscing and other evidenced-based practices will be used to promote progress towards healthy functioning.   Healthcare consumer, [place] will: Actively participate in therapy, working towards healthy  functioning.    *Justification for Continuation/Discontinuation of Goal: R=Revised, O=Ongoing, A=Achieved, D=Discontinued  Goal 1) Integrate and implement all new mental, somatic, and behavioral ways of managing pain.  Target Date Goal Was reviewed Status Code Progress towards goal/5 pt Likert rating  3/12/202 02/23/2023          O 3/5 - Sakiyah has learned and implements some skills inconsistently             Goal 2) Develop healthy thinking patterns and beliefs about self, others, and the world that lead to  the alleviation and help prevent the relapse of depression.  Target Date Goal Was reviewed Status Code Progress towards goal/5 pt Likert rating  02/23/2024 02/23/2023          O 3/5 - Pt continues to progress in her ability to shift to a more positive mind set.             Goal 3) Increase the level and range of activity by identifying and engaging in values-consistent  pleasurable activities.  Target Date Goal Was reviewed Status Code Progress towards goal/5 pt Likert rating  02/23/2024 02/23/2023           O 3/5 - Pt has been more involved with the care of her grandsons and has found it to be very gratifying.  She has improved in her ability to set limits around her availability.             This plan has been reviewed and created by the following participants:  This plan will be reviewed  at least every 12 months. Date Behavioral Health Clinician Date Guardian/Patient   02/23/2023 Royetta Crochet, Ph.D.  02/23/2023 [place] Lazar  Diagnosis F 307.89 (Other pain disorders related to psychological factors)  F 33.0 Major Depressive Disorder, recurrent, mild Z 60.0 Phase of Life Problem   In session today, we conducted pt's annual review.  We reviewed Keyri's progress, d/ goals and updated her treatment plan.  Neyah actively participated in the creation of her treatment plan and freely gave her consent.  Shanese reports that she got her temporary dentures  after pulling 11 teeth.  We d/p how the procedure went and how she was able to manage her anxiety and pain.  We talked about her discomfort and hopes for a brighter future.  Lastly, she shared some stories about her grandson and how she responded to his misbehavior.  I provided the support and guidance needed.  Royetta Crochet, PhD   Grandsons:  [place] (7) Cannon (3) Crew (1)

## 2023-03-03 DIAGNOSIS — L400 Psoriasis vulgaris: Secondary | ICD-10-CM | POA: Diagnosis not present

## 2023-03-04 ENCOUNTER — Other Ambulatory Visit: Payer: Self-pay | Admitting: Family

## 2023-03-09 ENCOUNTER — Ambulatory Visit: Payer: Medicare PPO | Admitting: Psychology

## 2023-03-11 ENCOUNTER — Ambulatory Visit: Payer: Medicare PPO | Admitting: Cardiology

## 2023-03-16 ENCOUNTER — Ambulatory Visit: Payer: Medicare PPO

## 2023-03-16 DIAGNOSIS — I6523 Occlusion and stenosis of bilateral carotid arteries: Secondary | ICD-10-CM | POA: Diagnosis not present

## 2023-03-16 DIAGNOSIS — Z9889 Other specified postprocedural states: Secondary | ICD-10-CM

## 2023-03-21 NOTE — Progress Notes (Signed)
Carotid artery duplex 03/16/2023: Duplex suggests stenosis in the right internal carotid artery (16-49%). There is a patent endarterectomy site starting at the left carotid ICA-prox extending into the ICA-mid.  <50% stenosis in the left common carotid vessels. Antegrade right vertebral artery flow. Antegrade left vertebral artery flow. No significant change from 03/04/2022. Follow up in one year is appropriate if clinically indicated.

## 2023-03-22 ENCOUNTER — Ambulatory Visit: Payer: Medicare PPO | Admitting: Sports Medicine

## 2023-03-23 ENCOUNTER — Ambulatory Visit (INDEPENDENT_AMBULATORY_CARE_PROVIDER_SITE_OTHER): Payer: Medicare PPO | Admitting: Psychology

## 2023-03-23 DIAGNOSIS — F330 Major depressive disorder, recurrent, mild: Secondary | ICD-10-CM | POA: Diagnosis not present

## 2023-03-23 NOTE — Progress Notes (Signed)
PROGRESS NOTE: Annual Review  Name: [name] [name] Date: 03/23/2023 MRN: [id number] DOB: Oct 23, 1951 PCP: [name] Bass, FNP  Time spent:  Start: 12:00 PM End:  12:55 PM   Today I met with [name] [name] in a remote video (Caregility) face to face individual psychotherapy.   Distance Site: Client's Home  Originating Site: Dr. Odette [name] Remote Office  Consent: Obtained verbal consent to transmit session remotely     Individualized Treatment Plan Strengths: verbal, motivated  Supports: family, spiritual beliefs   Goal/Needs for Treatment:  In order of importance to patient 1) Integrate and implement strategies and skills to better manage pain. 2) Increase the level of activity by identifying and engaging in values-consistent pleasurable activities. 3) Develop healthy thinking patterns and beliefs     Client Statement of Needs: I want to be happy and live a fuller life, enjoy being retired   Treatment Level: Biweekly Individual Outpatient Psychotherapy  Symptoms:Complains of generalized pain in many joints, muscles, and bones that debilitates normal functioning. Decrease or loss of appetite.  Depressed or irritable mood.  Exhibits signs and symptoms of depression. Experiences an increase in general physical discomfort (e.g., fatigue, insomnia, muscle tension, body aches). Experiences back, and leg pain Lack of energy.  Sleeplessness or hypersomnia.  Unresolved grief issues.   Client Treatment Preferences: female therapist   Healthcare consumer's goal for treatment:  Psychologist, Dr. Hilma [name], Ph.D. will support the patient's ability to achieve the goals identified. Cognitive Behavioral Therapy, Dialectical Behavioral Therapy, Motivational Interviewing, reminiscing and other evidenced-based practices will be used to promote progress towards healthy functioning.   Healthcare consumer, [name] [name] will: Actively participate in therapy, working towards  healthy functioning.    *Justification for Continuation/Discontinuation of Goal: R=Revised, O=Ongoing, A=Achieved, D=Discontinued  Goal 1) Integrate and implement all new mental, somatic, and behavioral ways of managing pain.  Target Date Goal Was reviewed Status Code Progress towards goal/5 pt Likert rating  3/12/202 02/23/2023          O 3/5 - [name] [name] has learned and implements some skills inconsistently             Goal 2) Develop healthy thinking patterns and beliefs about self, others, and the world that lead to  the alleviation and help prevent the relapse of depression.  Target Date Goal Was reviewed Status Code Progress towards goal/5 pt Likert rating  02/23/2024 02/23/2023          O 3/5 - Pt continues to progress in her ability to shift to a more positive mind set.             Goal 3) Increase the level and range of activity by identifying and engaging in values-consistent  pleasurable activities.  Target Date Goal Was reviewed Status Code Progress towards goal/5 pt Likert rating  02/23/2024 02/23/2023           O 3/5 - Pt has been more involved with the care of her grandsons and has found it to be very gratifying.  She has improved in her ability to set limits around her availability.             This plan has been reviewed and created by the following participants:  This plan will be reviewed  at least every 12 months. Date Behavioral Health Clinician Date Guardian/Patient   02/23/2023 [name] [name], Ph.D.  02/23/2023 [name] [name]  Diagnosis: Major Depressive Disorder, recurrent, mild Phase of Life Problem   [name] [name] reports that her son invited her to the beach with his family over the Easter holiday.  She states that she had a marvelous time.  However, she was feeling tired and caught a cold.  We d/p that it's been ages since she was last at the beach and reminisced about past times at the beach with family.  I noted that her spirits were higher than  I've seen them in a long time.  The vacation did her good.    [name] Favors, PhD   Grandsons:  [place] (7) Cannon (3) Crew (1)

## 2023-03-25 ENCOUNTER — Ambulatory Visit: Payer: Medicare PPO | Admitting: Cardiology

## 2023-03-30 ENCOUNTER — Encounter: Payer: Self-pay | Admitting: Sports Medicine

## 2023-03-30 ENCOUNTER — Ambulatory Visit: Payer: Medicare PPO | Admitting: Sports Medicine

## 2023-03-30 DIAGNOSIS — M25511 Pain in right shoulder: Secondary | ICD-10-CM | POA: Diagnosis not present

## 2023-03-30 DIAGNOSIS — M12812 Other specific arthropathies, not elsewhere classified, left shoulder: Secondary | ICD-10-CM

## 2023-03-30 DIAGNOSIS — M19011 Primary osteoarthritis, right shoulder: Secondary | ICD-10-CM | POA: Diagnosis not present

## 2023-03-30 DIAGNOSIS — G8929 Other chronic pain: Secondary | ICD-10-CM | POA: Diagnosis not present

## 2023-03-30 MED ORDER — MELOXICAM 15 MG PO TABS: 15.0000 mg | ORAL_TABLET | Freq: Every day | ORAL | 0 refills | Status: DC

## 2023-03-30 NOTE — Progress Notes (Signed)
[name] [name] - 72 y.o. female MRN [id number]  Date of birth: [birth year]  Office Visit Note: Visit Date: 03/30/2023 PCP: [name], FNP Referred by: [name],*  Subjective: Chief Complaint  Patient presents with   Right Shoulder - Follow-up   HPI: [name] [name] is a pleasant 72 y.o. female who presents today for follow-up of acute on chronic right shoulder pain.  She is still having some pain within the shoulder, feels like the injection wore off and now having an exacerbation of her pain. Also getting a grating sensation with range of motion.  Not as much pain at rest, but will have pain with reaching activity.  She is trying to do the home exercises and has for > 1 month now but still has some pain with it.  Taking Tylenol 3 and MR but not helping with the pain. No numbness or tingling.  SAJ 01/20/23 - helped relief large amount of her pain for > 1 month but wore off GHJ on 02/17/23 -had about 50+ percent relief for about 3-4 weeks, but then the pain returned  Pertinent ROS were reviewed with the patient and found to be negative unless otherwise specified above in HPI.   Assessment & Plan: Visit Diagnoses:  1. Chronic right shoulder pain   2. Primary osteoarthritis, right shoulder   3. Rotator cuff arthropathy of left shoulder    Plan: Discussed with [name] [name] she is dealing with an exacerbation of her chronic right shoulder pain with osteoarthritis.  She has gotten about a month of relief from both a subacromial and glenohumeral joint injection however her pain has returned.  Her pain is quite severe that is limiting her activities of daily living.  We did discuss that based on her x-rays she does have moderate to severe arthritic change.  Given the only temporary relief from injections and failure to significantly improve with home physical therapy, I think the next step would be to obtain an MRI of the shoulder to evaluate for the degree of cartilage loss/OA  as well as evaluate her rotator cuff tendons.  She is agreeable to this plan.  We will start her on meloxicam 15 mg to be taken once daily to help with inflammation and with her acute pain.  She will continue her Tylenol 3 and muscle relaxers in the meantime.  Follow-up: Return for F/u in 3 business days after MRI shoulder.   Meds & Orders:  Meds ordered this encounter  Medications   meloxicam (MOBIC) 15 MG tablet    Sig: Take 1 tablet (15 mg total) by mouth daily.    Dispense:  30 tablet    Refill:  0    Orders Placed This Encounter  Procedures   MR SHOULDER RIGHT WO CONTRAST     Procedures: No procedures performed      Clinical History: No specialty comments available.  She reports that she quit smoking about 43 years ago. Her smoking use included cigarettes. She smoked an average of .25 packs per day. Her smokeless tobacco use includes snuff. No results for input(s): "HGBA1C", "LABURIC" in the last 8760 hours.  Objective:    Physical Exam  Gen: Well-appearing, in no acute distress; non-toxic CV: Well-perfused. Warm.  Resp: Breathing unlabored on room air; no wheezing. Psych: Fluid speech in conversation; appropriate affect; normal thought process Neuro: Sensation intact throughout. No gross coordination deficits.   Ortho Exam -Right shoulder: Mild TTP palpating deep within the anterior shoulder joint, no AC  joint TTP.  She has forward flexion to about 160 degrees, abduction to about 115 degrees, unable to take her further passively albeit with some pain.  External rotation of about 50 degrees compared to 60 degrees of the contralateral shoulder.  She does have some pain with external rotation with the elbow at 90 degrees.  No significant weakness with rotator cuff testing, although there is pain with resisted ER and abduction.  Imaging: 3 views of the right shoulder including Grashey, scapular Y, axial views  were ordered and reviewed by myself.  X-rays demonstrate moderate  to  severe glenohumeral joint arthritic change.  There is moderate AC joint  arthropathy with a downsloping acromion.  No acute fracture noted.   Past Medical/Family/Surgical/Social History: Medications & Allergies reviewed per EMR, new medications updated. Patient Active Problem List   Diagnosis Date Noted   Claudication in peripheral vascular disease 08/26/2020   Neurogenic claudication 07/10/2020   Degenerative lumbar spinal stenosis 05/16/2020   Sacroiliac pain 08/10/2019   Anxiety and depression 05/11/2019   Chest pain 05/09/2019   Arthritis of sacroiliac joint of both sides 05/01/2019   Polyarthralgia 12/28/2018   Preventative health care 10/20/2018   RLS (restless legs syndrome) 09/27/2018   Insomnia disorder related to known organic factor 06/27/2018   PLMD (periodic limb movement disorder) 06/27/2018   Hot flashes due to menopause 04/25/2018   Palpitations 04/25/2018   Diaphoresis 04/25/2018   Long term current use of antithrombotics/antiplatelets 04/19/2018   Insomnia 04/19/2018   Intractable episodic headache 02/28/2018   Numbness and tingling of right arm 02/28/2018   Toe pain, right 10/19/2017   Rotator cuff arthropathy of left shoulder 06/02/2017   Trigger thumb of left hand 06/02/2017   Lipoma 05/06/2017   Pain of left thumb 05/06/2017   Chronic pain syndrome 12/15/2016   Lumbar degenerative disc disease 12/15/2016   Left leg pain 01/28/2016   Left shoulder pain 04/09/2015   Impaired glucose tolerance 07/30/2013   Abnormal breath sounds 07/30/2013   Asymptomatic bilateral carotid artery stenosis 05/05/2012   Anxiety 04/03/2011   CVA (cerebral infarction) 03/15/2011   HLD (hyperlipidemia) 11/03/2010   Hx of adenomatous colonic polyps 02/09/2008   Hypothyroidism 11/06/2007   ALLERGIC RHINITIS 11/06/2007   OSTEOPOROSIS 11/06/2007   ALCOHOL ABUSE, HX OF 11/06/2007   Essential hypertension 09/13/2007   Asthma 09/13/2007   PERIMENOPAUSAL STATUS 09/13/2007    Chronic insomnia 09/13/2007   Past Medical History:  Diagnosis Date   Acid reflux    ALCOHOL ABUSE, HX OF 11/06/2007   ALLERGIC RHINITIS 11/06/2007   Anxiety 04/03/2011   ASTHMA 09/13/2007   Asthma    Chronic pain syndrome 12/15/2016   COLONIC POLYPS, HX OF 02/09/2008    ADENOMATOUS POLYP   Encounter for well adult exam without abnormal findings 04/03/2011   HYPERLIPIDEMIA 11/03/2010   HYPERTENSION 09/13/2007   HYPOTHYROIDISM 11/06/2007   Impaired glucose tolerance 07/30/2013   INSOMNIA, HX OF 09/13/2007   Lumbar degenerative disc disease 12/15/2016   OSTEOPOROSIS 11/06/2007   PERIMENOPAUSAL STATUS 09/13/2007   RLS (restless legs syndrome)    Stroke (HCC)    TIA (transient ischemic attack) 11/04/2011   Family History  Problem Relation Age of Onset   Hypertension Mother 83   Heart disease Mother    Heart failure Mother 49   Hypertension Brother    Colon cancer Brother 90   Dementia Maternal Grandmother    Breast cancer Cousin    Hyperlipidemia Other    Hypertension Other  Coronary artery disease Other    Past Surgical History:  Procedure Laterality Date   ABDOMINAL AORTOGRAM W/LOWER EXTREMITY N/A 08/27/2020   Procedure: ABDOMINAL AORTOGRAM W/LOWER EXTREMITY;  Surgeon: Elder Negus, MD;  Location: [facility];  Service: Cardiovascular;  Laterality: N/A;   BREAST EXCISIONAL BIOPSY Left    BREAST SURGERY  2008 and 2012   x 2 - benign, left side   carotid artery surgery Left    CESAREAN SECTION     x 3   COLONOSCOPY  multiple   2010   PERIPHERAL VASCULAR BALLOON ANGIOPLASTY  08/27/2020   Procedure: PERIPHERAL VASCULAR BALLOON ANGIOPLASTY;  Surgeon: Elder Negus, MD;  Location: [facility];  Service: Cardiovascular;;  Right SFA scoring balloon   TRANSFORAMINAL LUMBAR INTERBODY FUSION (TLIF) WITH PEDICLE SCREW FIXATION 1 LEVEL Left 07/10/2020   Procedure: LEFT-SIDED LUMBAR FOUR-FIVE TRANSFORAMINAL LUMBAR INTERBODY FUSION WITH INSTRUMENTATION AND  ALLOGRAFT;  Surgeon: Estill Bamberg, MD;  Location: [facility];  Service: Orthopedics;  Laterality: Left;   Social History   Occupational History   Occupation: Banker: [employer]  Tobacco Use   Smoking status: Former    Packs/day: .25    Types: Cigarettes    Quit date: 05/31/1979    Years since quitting: 43.8   Smokeless tobacco: Current    Types: Snuff   Tobacco comments:    social smoker  Vaping Use   Vaping Use: Never used  Substance and Sexual Activity   Alcohol use: No    Comment: former alcholic   Drug use: Not Currently    Types: Marijuana    Comment: Quit 1975   Sexual activity: Not on file

## 2023-03-30 NOTE — Progress Notes (Signed)
In a lot of pain Injection she had with [place] only lasted 30 days She was taking tylenol #3 and Muscle Relaxers but states this was not touching the pain  She would like clarification on how severe her arthritis is.

## 2023-04-05 ENCOUNTER — Ambulatory Visit: Payer: Medicare PPO | Admitting: Sports Medicine

## 2023-04-06 ENCOUNTER — Ambulatory Visit: Payer: Medicare PPO | Admitting: Psychology

## 2023-04-07 ENCOUNTER — Encounter: Payer: Self-pay | Admitting: Sports Medicine

## 2023-04-12 ENCOUNTER — Encounter: Payer: Self-pay | Admitting: Cardiology

## 2023-04-12 ENCOUNTER — Ambulatory Visit: Payer: Medicare PPO | Admitting: Cardiology

## 2023-04-12 VITALS — BP 136/78 | HR 72 | Resp 16 | Ht 59.0 in | Wt 140.0 lb

## 2023-04-12 DIAGNOSIS — I951 Orthostatic hypotension: Secondary | ICD-10-CM | POA: Diagnosis not present

## 2023-04-12 DIAGNOSIS — I10 Essential (primary) hypertension: Secondary | ICD-10-CM | POA: Diagnosis not present

## 2023-04-12 DIAGNOSIS — I739 Peripheral vascular disease, unspecified: Secondary | ICD-10-CM

## 2023-04-12 DIAGNOSIS — I6523 Occlusion and stenosis of bilateral carotid arteries: Secondary | ICD-10-CM

## 2023-04-12 MED ORDER — LOSARTAN POTASSIUM 25 MG PO TABS
25.0000 mg | ORAL_TABLET | Freq: Every evening | ORAL | 3 refills | Status: DC
Start: 2023-04-12 — End: 2024-05-01

## 2023-04-12 NOTE — Progress Notes (Signed)
Primary Physician/Referring:  [name], FNP  Patient ID: [name] [name], female    DOB: 10/06/1951, 72 y.o.   MRN: [id number]  Chief Complaint  Patient presents with   Orthostatic hypotension   PAD        Coronary Artery Disease   Follow-up    6 weeks    HPI:    [name] [name]  is a 72 y.o. AA female  with history of left carotid endarterectomy due to symptomatic carotid stenosis in 2012 with TIA, asymptomatic right carotid stenosis, PAD with history of right SFA angioplasty, bronchial asthma, hypertension, hyperlipidemia, hyperglycemia.  She is still chewing tobacco.  Since last office visit 6 months ago she has lost about 18 pounds in weight.  Presently not having any symptoms of claudication, chest pain or dyspnea. This is a 6-week office visit encounter for follow-up of orthostatic hypotension and carotid stenosis along with peripheral arterial disease.  Since being on diltiazem, she has not had any palpitations.  Also since last office visit since medications were discontinued, states that her dizzy spells are completely resolved and her energy level is also improved.  Patient reports symptoms of claudication are presently well controlled on current medical regimen.  Past Medical History:  Diagnosis Date   Acid reflux    ALCOHOL ABUSE, HX OF 11/06/2007   ALLERGIC RHINITIS 11/06/2007   Anxiety 04/03/2011   ASTHMA 09/13/2007   Asthma    Chronic pain syndrome 12/15/2016   COLONIC POLYPS, HX OF 02/09/2008    ADENOMATOUS POLYP   Encounter for well adult exam without abnormal findings 04/03/2011   HYPERLIPIDEMIA 11/03/2010   HYPERTENSION 09/13/2007   HYPOTHYROIDISM 11/06/2007   Impaired glucose tolerance 07/30/2013   INSOMNIA, HX OF 09/13/2007   Lumbar degenerative disc disease 12/15/2016   OSTEOPOROSIS 11/06/2007   PERIMENOPAUSAL STATUS 09/13/2007   RLS (restless legs syndrome)    Stroke [facility])    TIA (transient ischemic attack) 11/04/2011   Past Surgical History:   Procedure Laterality Date   ABDOMINAL AORTOGRAM W/LOWER EXTREMITY N/A 08/27/2020   Procedure: ABDOMINAL AORTOGRAM W/LOWER EXTREMITY;  Surgeon: Elder Negus, MD;  Location: [facility];  Service: Cardiovascular;  Laterality: N/A;   BREAST EXCISIONAL BIOPSY Left    BREAST SURGERY  2008 and 2012   x 2 - benign, left side   carotid artery surgery Left    CESAREAN SECTION     x 3   COLONOSCOPY  multiple   2010   PERIPHERAL VASCULAR BALLOON ANGIOPLASTY  08/27/2020   Procedure: PERIPHERAL VASCULAR BALLOON ANGIOPLASTY;  Surgeon: Elder Negus, MD;  Location: [facility];  Service: Cardiovascular;;  Right SFA scoring balloon   TRANSFORAMINAL LUMBAR INTERBODY FUSION (TLIF) WITH PEDICLE SCREW FIXATION 1 LEVEL Left 07/10/2020   Procedure: LEFT-SIDED LUMBAR FOUR-FIVE TRANSFORAMINAL LUMBAR INTERBODY FUSION WITH INSTRUMENTATION AND ALLOGRAFT;  Surgeon: Estill Bamberg, MD;  Location: [facility];  Service: Orthopedics;  Laterality: Left;   Social History   Tobacco Use   Smoking status: Former    Packs/day: .25    Types: Cigarettes    Quit date: 05/31/1979    Years since quitting: 43.8   Smokeless tobacco: Current    Types: Snuff   Tobacco comments:    social smoker  Substance Use Topics   Alcohol use: No    Comment: former alcholic   Marital Status: Divorced  ROS  Review of Systems  Cardiovascular:  Positive for claudication. Negative for chest pain, dyspnea on exertion, leg  swelling and palpitations.  Neurological:  Negative for dizziness.   Objective  Blood pressure 136/78, pulse 72, resp. rate 16, height 4\' 11"  (1.499 m), weight 140 lb (63.5 kg), SpO2 95 %.     04/12/2023    3:00 PM 01/29/2023    2:26 PM 01/12/2023    3:03 PM  Vitals with BMI  Height 4\' 11"  4\' 11"  4\' 11"   Weight 140 lbs 140 lbs 10 oz 143 lbs 6 oz  BMI 28.26 28.38 28.95  Systolic 136 91 132  Diastolic 78 51 78  Pulse 72 64 94    Orthostatic VS for the past 72 hrs (Last 3 readings):   Orthostatic BP Patient Position BP Location Cuff Size Orthostatic Pulse  04/12/23 1510 141/83 Standing Left Arm Normal 68  04/12/23 1509 144/66 Sitting Left Arm Normal 71  04/12/23 1508 157/84 Supine Left Arm Normal 72      Physical Exam Vitals reviewed.  Constitutional:      Comments: Short stature  Neck:     Vascular: Carotid bruit (faint right carotid bruit, Lt CEA Scar) present. No JVD.  Cardiovascular:     Rate and Rhythm: Normal rate and regular rhythm.     Pulses:          Popliteal pulses are 0 on the right side and 0 on the left side.       Dorsalis pedis pulses are 1+ on the right side and 1+ on the left side.       Posterior tibial pulses are 0 on the right side and 0 on the left side.     Heart sounds: S1 normal and S2 normal. No murmur heard.    No gallop.  Pulmonary:     Effort: Pulmonary effort is normal.     Breath sounds: Normal breath sounds.  Abdominal:     General: Abdomen is flat.     Palpations: Abdomen is soft.  Musculoskeletal:     Right lower leg: No edema.     Left lower leg: No edema.  Skin:    Capillary Refill: Capillary refill takes less than 2 seconds.    Laboratory examination:   Lab Results  Component Value Date   NA 137 11/17/2022   K 4.3 11/17/2022   CO2 29 11/17/2022   GLUCOSE 122 (H) 11/17/2022   BUN 21 11/17/2022   CREATININE 1.18 11/17/2022   CALCIUM 9.5 11/17/2022   EGFR 45 (L) 06/17/2021   GFRNONAA 70 08/08/2020       Latest Ref Rng & Units 11/17/2022   12:12 PM 10/13/2022    3:18 PM 03/10/2022    2:54 PM  CMP  Glucose 70 - 99 mg/dL 161  096  045   BUN 6 - 23 mg/dL 21  23  21    Creatinine 0.40 - 1.20 mg/dL 4.09  8.11  9.14   Sodium 135 - 145 mEq/L 137  137  137   Potassium 3.5 - 5.1 mEq/L 4.3  4.2  3.9   Chloride 96 - 112 mEq/L 99  102  102   CO2 19 - 32 mEq/L 29  27  27    Calcium 8.4 - 10.5 mg/dL 9.5  9.8  9.8   Total Protein 6.0 - 8.3 g/dL 6.6  7.1  6.7   Total Bilirubin 0.2 - 1.2 mg/dL 0.4  0.5  0.4   Alkaline  Phos 39 - 117 U/L 94  91  110   AST 0 - 37 U/L 21  19  18   ALT 0 - 35 U/L 15  13  15        Latest Ref Rng & Units 10/13/2022    3:18 PM 03/10/2022    2:54 PM 12/09/2021    3:16 PM  CBC  WBC 4.0 - 10.5 K/uL 6.5  7.0  7.5   Hemoglobin 12.0 - 15.0 g/dL 16.1  09.6  04.5   Hematocrit 36.0 - 46.0 % 37.1  38.3  35.8   Platelets 150.0 - 400.0 K/uL 239.0  223.0  227.0    Lipid Panel Recent Labs    10/13/22 1518  CHOL 147  TRIG 85.0  LDLCALC 76  VLDL 17.0  HDL 54.40  CHOLHDL 3    HEMOGLOBIN A1C Lab Results  Component Value Date   HGBA1C 6.4 10/16/2019   MPG 128 (H) 03/04/2011   TSH Recent Labs    10/13/22 1518  TSH 0.71    Radiology:   No results found.  Cardiac Studies:   PERIPHERAL VASC BALLOON ANGIOPLASTY/ABD AORTOGRAM W/LOWER EXTREMITY 08/27/2020:  Rt SFA prox 70% diffuse, mid 95% focal stenosis Left SFA mid 20% stenosis Bilateral internal iliac ostial 50% stenosis, followed by small aneurysmal areas Three vessel runoff below the knee b/l Successful PTCA right SFA using chocolate balloon 5.0X120 mm 0% residual stenosis  Lower Extremity Arterial Duplex 07/16/2021: RIght mid, distal SFA and proximal popliteal artery appears occluded with collateral flow. Left distal SFA appears to be occluded.   This exam reveals mildly decreased perfusion of the right lower extremity, noted at the anterior tibial and post tibial artery level (ABI 0.9) and mildly decreased perfusion of the left lower extremity, noted at the post tibial artery level (ABI 0.9). No significant change from 04/22/2020.  PCV MYOCARDIAL PERFUSION WITH LEXISCAN 01/05/2022  Narrative Lexiscan Nuclear stress test 01/06/2021: Nondiagnostic ECG stress due to pharmacologic stress.  7/10 chest pian without EKG changes. Myocardial perfusion is normal. TID is mildly abnormal at 1.25. Overall LV systolic function is normal without regional wall motion abnormalities. Stress LV EF: 69%. Compared to previous  study: 2019/06/26, Chest pain with Lexiscan infusion and TID now noted previously not present. Intermediate risk study.  PCV ECHOCARDIOGRAM COMPLETE 01/05/2022  Narrative Echocardiogram 01/05/2022: Left ventricle cavity is normal in size and wall thickness. Normal global wall motion. Normal LV systolic function with EF 55%. Normal diastolic filling pattern. Calculated EF 55%. Trileaflet aortic valve with mild aortic valve leaflet calcification. No significant stenosis or regurgitation. Mild mitral valve leaflet calcification. Mild to moderate mitral regurgitation. Normal right atrial pressure.  Carotid artery duplex 03/16/2023: Duplex suggests stenosis in the right internal carotid artery (16-49%). There is a patent endarterectomy site starting at the left carotid ICA-prox extending into the ICA-mid.  <50% stenosis in the left common carotid vessels. Antegrade right vertebral artery flow. Antegrade left vertebral artery flow. No significant change from 03/04/2022. Follow up in one year is appropriate if clinically indicated.     EKG:   EKG 01/29/2023: Normal sinus rhythm with rate of 65 bpm, left atrial enlargement, normal axis.  No evidence of ischemia, normal QT interval.  Compared to 01/30/2022, no significant change, previous heart rate was 90 bpm.   Allergies   Allergies  Allergen Reactions   Atorvastatin Other (See Comments)    Arthralgia and myalgia   Benazepril Swelling   Lisinopril Cough   Fosamax [Alendronate Sodium] Nausea And Vomiting   Penicillins Hives    Did it involve swelling of the face/tongue/throat, SOB, or low BP? No  Did it involve sudden or severe rash/hives, skin peeling, or any reaction on the inside of your mouth or nose? Yes Did you need to seek medical attention at a hospital or doctor's office? Yes When did it last happen? Over 10 years ago If all above answers are "NO", may proceed with cephalosporin use.   Trazodone And Nefazodone     Current  Outpatient Medications:    acitretin (SORIATANE) 25 MG capsule, Take 1 capsule by mouth daily., Disp: , Rfl:    Calcium Carbonate-Vit D-Min (CALCIUM 1200 PO), Take 1,200 mg by mouth daily., Disp: , Rfl:    Cholecalciferol (VITAMIN D) 50 MCG (2000 UT) tablet, Take 2,000 Units by mouth daily., Disp: , Rfl:    citalopram (CELEXA) 20 MG tablet, Take 1 tablet (20 mg total) by mouth daily., Disp: 90 tablet, Rfl: 3   clopidogrel (PLAVIX) 75 MG tablet, TAKE 1 TABLET BY MOUTH EVERY DAY, Disp: 90 tablet, Rfl: 3   diltiazem (CARDIZEM CD) 180 MG 24 hr capsule, TAKE 1 CAPSULE BY MOUTH EVERY DAY, Disp: 90 capsule, Rfl: 3   ezetimibe (ZETIA) 10 MG tablet, TAKE 1 TABLET BY MOUTH DAILY AFTER SUPPER, Disp: 90 tablet, Rfl: 3   fluticasone-salmeterol (ADVAIR DISKUS) 250-50 MCG/ACT AEPB, Inhale 1 puff into the lungs in the morning and at bedtime., Disp: 60 each, Rfl: 3   levothyroxine (SYNTHROID) 88 MCG tablet, TAKE 1 TABLET BY MOUTH EVERY DAY BEFORE BREAKFAST, Disp: 90 tablet, Rfl: 3   meloxicam (MOBIC) 15 MG tablet, Take 1 tablet (15 mg total) by mouth daily., Disp: 30 tablet, Rfl: 0   methocarbamol (ROBAXIN) 500 MG tablet, Take 1 tablet (500 mg total) by mouth every 8 (eight) hours as needed., Disp: 30 tablet, Rfl: 2   montelukast (SINGULAIR) 10 MG tablet, TAKE 1 TABLET BY MOUTH EVERY DAY, Disp: 90 tablet, Rfl: 3   Multiple Vitamin (MULTIVITAMIN WITH MINERALS) TABS tablet, Take 1 tablet by mouth daily., Disp: , Rfl:    pantoprazole (PROTONIX) 40 MG tablet, Take 1 tablet (40 mg total) by mouth 2 (two) times daily., Disp: 180 tablet, Rfl: 3   rOPINIRole (REQUIP) 0.5 MG tablet, Take 1 tablet (0.5 mg total) by mouth at bedtime., Disp: 90 tablet, Rfl: 3   rosuvastatin (CRESTOR) 20 MG tablet, TAKE 1 TABLET BY MOUTH EVERY DAY, Disp: 90 tablet, Rfl: 3   zolpidem (AMBIEN) 10 MG tablet, TAKE 1 TABLET (10 MG TOTAL) BY MOUTH AT BEDTIME AS NEEDED. FOR SLEEP, Disp: 90 tablet, Rfl: 0   losartan (COZAAR) 25 MG tablet, Take 1 tablet  (25 mg total) by mouth every evening., Disp: 90 tablet, Rfl: 3   Assessment     ICD-10-CM   1. Orthostatic hypotension  I95.1     2. Claudication in peripheral vascular disease (HCC)  I73.9     3. Asymptomatic bilateral carotid artery stenosis  I65.23 PCV CAROTID DUPLEX (BILATERAL)    4. Primary hypertension  I10 losartan (COZAAR) 25 MG tablet      Meds ordered this encounter  Medications   losartan (COZAAR) 25 MG tablet    Sig: Take 1 tablet (25 mg total) by mouth every evening.    Dispense:  90 tablet    Refill:  3     Medications Discontinued During This Encounter  Medication Reason   acetaminophen-codeine (TYLENOL #3) 300-30 MG tablet    losartan (COZAAR) 25 MG tablet Reorder     Recommendations:   [name] [name]  is a  72 y.o. AA female  with history of left carotid endarterectomy due to symptomatic carotid stenosis in 2012 with TIA, asymptomatic right carotid stenosis, PAD with history of right SFA angioplasty, bronchial asthma, hypertension, hyperlipidemia, hyperglycemia.  She is still chewing tobacco.  As she is edentulous, she had lost about 18 to 20 pounds in weight.  This is a 6-week office visit encounter for follow-up of orthostatic hypotension and carotid stenosis along with peripheral arterial disease.  1. Orthostatic hypotension On her last office visit I discontinued spironolactone and also switched her from losartan HCT to plain losartan at a lower dose and discontinue metoprolol.  With this change, patient symptoms of dizziness and marked fatigue is essentially resolved.  She still is orthostatic today.  However it is mild.  Continue present medications.  Advised her that if she ever has elevated blood pressure, she could certainly take an extra dose of the losartan or diltiazem CD.    2. Claudication in peripheral vascular disease (HCC) Symptoms of claudication remained stable, however her main issue has been severe back pain.  She is now being evaluated by  Venita Lick for the same and has MRI scheduled soon.  3. Asymptomatic bilateral carotid artery stenosis Reviewed the results of carotid artery duplex, mild stenosis, will recheck in 1 year. - PCV CAROTID DUPLEX (BILATERAL); Future  4. Primary hypertension Blood pressure is well-controlled on present medical regimen, continue the same.  - losartan (COZAAR) 25 MG tablet; Take 1 tablet (25 mg total) by mouth every evening.  Dispense: 90 tablet; Refill: 3    Yates Decamp, MD, [facility] 04/12/2023, 10:08 PM Office: [phone] Fax: [phone] Pager: [phone]

## 2023-04-13 ENCOUNTER — Ambulatory Visit
Admission: RE | Admit: 2023-04-13 | Discharge: 2023-04-13 | Disposition: A | Discharge status: Home / Self Care | Payer: Medicare PPO | Source: Ambulatory Visit | Attending: Sports Medicine | Admitting: Sports Medicine

## 2023-04-13 DIAGNOSIS — G8929 Other chronic pain: Secondary | ICD-10-CM

## 2023-04-13 DIAGNOSIS — M25511 Pain in right shoulder: Secondary | ICD-10-CM | POA: Diagnosis not present

## 2023-04-19 ENCOUNTER — Ambulatory Visit (INDEPENDENT_AMBULATORY_CARE_PROVIDER_SITE_OTHER): Payer: Medicare PPO | Admitting: Sports Medicine

## 2023-04-19 ENCOUNTER — Encounter: Payer: Self-pay | Admitting: Sports Medicine

## 2023-04-19 DIAGNOSIS — G8929 Other chronic pain: Secondary | ICD-10-CM | POA: Diagnosis not present

## 2023-04-19 DIAGNOSIS — S46811A Strain of other muscles, fascia and tendons at shoulder and upper arm level, right arm, initial encounter: Secondary | ICD-10-CM

## 2023-04-19 DIAGNOSIS — M25511 Pain in right shoulder: Secondary | ICD-10-CM

## 2023-04-19 DIAGNOSIS — M75101 Unspecified rotator cuff tear or rupture of right shoulder, not specified as traumatic: Secondary | ICD-10-CM

## 2023-04-19 DIAGNOSIS — M19011 Primary osteoarthritis, right shoulder: Secondary | ICD-10-CM | POA: Diagnosis not present

## 2023-04-19 MED ORDER — HYDROCODONE-ACETAMINOPHEN 5-325 MG PO TABS: 1.0000 | ORAL_TABLET | Freq: Four times a day (QID) | ORAL | 0 refills | Status: DC | PRN

## 2023-04-19 NOTE — Progress Notes (Signed)
[name] [name] - 72 y.o. female MRN [id number]  Date of birth: 1951/11/08  Office Visit Note: Visit Date: 04/19/2023 PCP: [name], FNP Referred by: [name],*  Subjective: Chief Complaint  Patient presents with   Right Shoulder - Follow-up   HPI: [name] [name] is a pleasant 72 y.o. female who presents today for follow-up of right shoulder pain and MRI review.  Avy is still having shoulder pain.  He had the last few days her pain has been worse and has been affecting her sleep.  She notes pain with reaching motions, also noting a grating sensation with any range of motion.  She has had temporary relief with subacromial and glenohumeral joint injections but is looking for more of a permanent solution.  The home exercises at home does cause her some pain.  Has been taking Tylenol 3 in the past but is out of this, muscle relaxers without much relief.  MRI showed a full-thickness supraspinatus tear at the footprint with 1.3 cm of retraction.  Also high-grade articular sided infraspinatus tendon pain.  At least moderate glenohumeral arthritic change with loss of cartilage.  SAJ 01/20/23 - helped relief large amount of her pain for > 1 month but wore off GHJ on 02/17/23 -had about 50+ percent relief for about 3-4 weeks, but then the pain returned  Pertinent ROS were reviewed with the patient and found to be negative unless otherwise specified above in HPI.   Assessment & Plan: Visit Diagnoses:  1. Tear of right supraspinatus tendon   2. Primary osteoarthritis, right shoulder   3. Chronic right shoulder pain   4. Tear of right infraspinatus tendon, initial encounter    Plan: Discussed with Danylle that based on her symptoms and her MRI scan which shows a full-thickness supraspinatus tear with retraction as well as insertional infraspinatus tear.  She also has at least moderate glenohumeral joint arthritis.  She has only had temporary relief from subacromial and  glenohumeral joint injections.  Her pain continues to be bothersome and preventing her from doing productive home exercises.  Given the chronicity and the burden of her pain, I do think it would be pertinent for her to see Dr. August Saucer to discuss surgical options, she may be a candidate for reverse total shoulder arthroplasty.  We did discuss some of the surgical options as a general overview today.  He is agreeable to see Dr. August Saucer to discuss surgery moving forward.  Can continue the home exercises in the meantime as tolerated.  Her acute on chronic pain is disrupting her sleep and quality of life, we will send in a short prescription of hydrocodone-acetaminophen to be taken every 6 hours only for breakthrough pain.  May continue meloxicam 15 mg daily.  Follow-up: Return for f/u with Dr. August Saucer for discussion on shoulder surgery .   Meds & Orders:  Meds ordered this encounter  Medications   HYDROcodone-acetaminophen (NORCO/VICODIN) 5-325 MG tablet    Sig: Take 1 tablet by mouth every 6 (six) hours as needed for moderate pain.    Dispense:  25 tablet    Refill:  0   No orders of the defined types were placed in this encounter.    Procedures: No procedures performed      Clinical History: No specialty comments available.  She reports that she quit smoking about 43 years ago. Her smoking use included cigarettes. She smoked an average of .25 packs per day. Her smokeless tobacco use includes snuff. No  results for input(s): "HGBA1C", "LABURIC" in the last 8760 hours.  Objective:   Vital Signs: There were no vitals taken for this visit.  Physical Exam  Gen: Well-appearing, in no acute distress; non-toxic CV:  Well-perfused. Warm.  Resp: Breathing unlabored on room air; no wheezing. Psych: Fluid speech in conversation; appropriate affect; normal thought process Neuro: Sensation intact throughout. No gross coordination deficits.   Ortho Exam - Right shoulder: + TTP at Codman's point.  There is pain  with active flexion and abduction.  Positive drop arm test.  Pain with resisted ER and abduction.  Imaging: MR SHOULDER RIGHT WO CONTRAST CLINICAL DATA:  RIGHT SHOULDER PAIN  EXAM: MRI OF THE RIGHT SHOULDER WITHOUT CONTRAST  TECHNIQUE: Multiplanar, multisequence MR imaging of the shoulder was performed. No intravenous contrast was administered.  COMPARISON:  Radiograph 01/20/2023  FINDINGS: Rotator cuff: There is a full-thickness, partial width tear of the supraspinatus tendon at the footprint with up to 1.3 cm retraction, tear measuring 1.2 cm in width. There is high-grade articular sided tearing of the distal infraspinatus tendon at the footprint. Teres minor tendon is intact. Subscapularis tendon is intact.  Muscles: No significant muscle atrophy.  Biceps Long Head: Mild intra-articular long head biceps tendinosis.  Acromioclavicular Joint: Moderate arthropathy of the acromioclavicular joint. Mild subacromial/subdeltoid bursal fluid related to the full-thickness cuff tear.  Glenohumeral Joint: No significant joint effusion. Moderate chondrosis.  Labrum: Diffuse labral degeneration without displaced labral tear.  Bones: No evidence of acute fracture.  No aggressive osseous lesion.  Other: No focal fluid collection.  IMPRESSION: Full-thickness, partial width tear of the supraspinatus tendon at the footprint with up to 1.3 cm retraction. High-grade articular sided tearing of the distal infraspinatus tendon at the footprint. No significant muscle atrophy.  Mild intra-articular long head biceps tendinosis.  Moderate glenohumeral and AC joint osteoarthritis.  Diffuse labral degeneration without displaced labral tear.  Electronically Signed   By: Caprice Renshaw M.D.   On: 04/15/2023 16:06    Past Medical/Family/Surgical/Social History: Medications & Allergies reviewed per EMR, new medications updated. Patient Active Problem List   Diagnosis Date Noted    Claudication in peripheral vascular disease (HCC) 08/26/2020   Neurogenic claudication 07/10/2020   Degenerative lumbar spinal stenosis 05/16/2020   Sacroiliac pain 08/10/2019   Anxiety and depression 05/11/2019   Chest pain 05/09/2019   Arthritis of sacroiliac joint of both sides 05/01/2019   Polyarthralgia 12/28/2018   Preventative health care 10/20/2018   RLS (restless legs syndrome) 09/27/2018   Insomnia disorder related to known organic factor 06/27/2018   PLMD (periodic limb movement disorder) 06/27/2018   Hot flashes due to menopause 04/25/2018   Palpitations 04/25/2018   Diaphoresis 04/25/2018   Long term current use of antithrombotics/antiplatelets 04/19/2018   Insomnia 04/19/2018   Intractable episodic headache 02/28/2018   Numbness and tingling of right arm 02/28/2018   Toe pain, right 10/19/2017   Rotator cuff arthropathy of left shoulder 06/02/2017   Trigger thumb of left hand 06/02/2017   Lipoma 05/06/2017   Pain of left thumb 05/06/2017   Chronic pain syndrome 12/15/2016   Lumbar degenerative disc disease 12/15/2016   Left leg pain 01/28/2016   Left shoulder pain 04/09/2015   Impaired glucose tolerance 07/30/2013   Abnormal breath sounds 07/30/2013   Asymptomatic bilateral carotid artery stenosis 05/05/2012   Anxiety 04/03/2011   CVA (cerebral infarction) 03/15/2011   HLD (hyperlipidemia) 11/03/2010   Hx of adenomatous colonic polyps 02/09/2008   Hypothyroidism 11/06/2007   ALLERGIC RHINITIS  11/06/2007   OSTEOPOROSIS 11/06/2007   ALCOHOL ABUSE, HX OF 11/06/2007   Essential hypertension 09/13/2007   Asthma 09/13/2007   PERIMENOPAUSAL STATUS 09/13/2007   Chronic insomnia 09/13/2007   Past Medical History:  Diagnosis Date   Acid reflux    ALCOHOL ABUSE, HX OF 11/06/2007   ALLERGIC RHINITIS 11/06/2007   Anxiety 04/03/2011   ASTHMA 09/13/2007   Asthma    Chronic pain syndrome 12/15/2016   COLONIC POLYPS, HX OF 02/09/2008    ADENOMATOUS POLYP   Encounter for  well adult exam without abnormal findings 04/03/2011   HYPERLIPIDEMIA 11/03/2010   HYPERTENSION 09/13/2007   HYPOTHYROIDISM 11/06/2007   Impaired glucose tolerance 07/30/2013   INSOMNIA, HX OF 09/13/2007   Lumbar degenerative disc disease 12/15/2016   OSTEOPOROSIS 11/06/2007   PERIMENOPAUSAL STATUS 09/13/2007   RLS (restless legs syndrome)    Stroke [facility])    TIA (transient ischemic attack) 11/04/2011   Family History  Problem Relation Age of Onset   Hypertension Mother 60   Heart disease Mother    Heart failure Mother 73   Hypertension Brother    Colon cancer Brother 7   Dementia Maternal Grandmother    Breast cancer Cousin    Hyperlipidemia Other    Hypertension Other    Coronary artery disease Other    Past Surgical History:  Procedure Laterality Date   ABDOMINAL AORTOGRAM W/LOWER EXTREMITY N/A 08/27/2020   Procedure: ABDOMINAL AORTOGRAM W/LOWER EXTREMITY;  Surgeon: Elder Negus, MD;  Location: [facility];  Service: Cardiovascular;  Laterality: N/A;   BREAST EXCISIONAL BIOPSY Left    BREAST SURGERY  2008 and 2012   x 2 - benign, left side   carotid artery surgery Left    CESAREAN SECTION     x 3   COLONOSCOPY  multiple   2010   PERIPHERAL VASCULAR BALLOON ANGIOPLASTY  08/27/2020   Procedure: PERIPHERAL VASCULAR BALLOON ANGIOPLASTY;  Surgeon: Elder Negus, MD;  Location: [facility];  Service: Cardiovascular;;  Right SFA scoring balloon   TRANSFORAMINAL LUMBAR INTERBODY FUSION (TLIF) WITH PEDICLE SCREW FIXATION 1 LEVEL Left 07/10/2020   Procedure: LEFT-SIDED LUMBAR FOUR-FIVE TRANSFORAMINAL LUMBAR INTERBODY FUSION WITH INSTRUMENTATION AND ALLOGRAFT;  Surgeon: Estill Bamberg, MD;  Location: [facility] OR;  Service: Orthopedics;  Laterality: Left;   Social History   Occupational History   Occupation: Banker: [employer]  Tobacco Use   Smoking status: Former    Packs/day: .25    Types: Cigarettes    Quit date: 05/31/1979     Years since quitting: 43.9   Smokeless tobacco: Current    Types: Snuff   Tobacco comments:    social smoker  Vaping Use   Vaping Use: Never used  Substance and Sexual Activity   Alcohol use: No    Comment: former alcholic   Drug use: Not Currently    Types: Marijuana    Comment: Quit 1975   Sexual activity: Not on file   I spent 33 minutes in the care of the patient today including face-to-face time, preparation to see the patient, as well as review of MRI in the room with patient, discussion on general overview of possible shoulder surgery to be performed by one of my orthopedic colleagues, Dr. August Saucer for the above diagnoses.   Madelyn Brunner, DO Primary Care Sports Medicine Physician  [facility] - Orthopedics  This note was dictated using Dragon naturally speaking software and may contain errors in syntax,  spelling, or content which have not been identified prior to signing this note.

## 2023-04-20 ENCOUNTER — Ambulatory Visit: Payer: Medicare PPO | Admitting: Psychology

## 2023-04-26 ENCOUNTER — Other Ambulatory Visit: Payer: Self-pay | Admitting: Sports Medicine

## 2023-04-29 ENCOUNTER — Telehealth: Payer: Self-pay | Admitting: Sports Medicine

## 2023-04-29 ENCOUNTER — Other Ambulatory Visit: Payer: Self-pay | Admitting: Sports Medicine

## 2023-04-29 ENCOUNTER — Telehealth: Payer: Self-pay

## 2023-04-29 MED ORDER — TRAMADOL HCL 50 MG PO TABS: 50.0000 mg | ORAL_TABLET | Freq: Four times a day (QID) | ORAL | 0 refills | Status: DC | PRN

## 2023-04-29 NOTE — Telephone Encounter (Signed)
Patient called triage phone stating the hydrocodone has been making her itch and she is not able to tolerate it. However she is asking for something different to help control the pain, states she has severe pain. Scheduled to see August Saucer 05/05/23 for surgery consult.

## 2023-04-29 NOTE — Telephone Encounter (Signed)
Patient states the hydrocodone is making her itch. Please call sending call to triage.

## 2023-04-29 NOTE — Telephone Encounter (Signed)
Informed patient of this.  °

## 2023-04-29 NOTE — Progress Notes (Signed)
Patient called in to clinic, she is having some itching and allergic like reaction to the Norco pain medication.  She is to discontinue this, I will give her a short course of 20 tablets only tramadol 50 mg to be taken for breakthrough pain only until she is able to see Dr. August Saucer to discuss surgical options.  My medical assistant has called the patient and let her know this is a one-time only prescription, this cannot be refilled chronically.  Needs to discontinue Norco in order to take new prescription of tramadol, my MA discussed this with patient during call.  Madelyn Brunner, DO Primary Care Sports Medicine Physician  [facility] - Orthopedics  This note was dictated using Dragon naturally speaking software and may contain errors in syntax, spelling, or content which have not been identified prior to signing this note.

## 2023-05-04 ENCOUNTER — Ambulatory Visit (INDEPENDENT_AMBULATORY_CARE_PROVIDER_SITE_OTHER): Payer: Medicare PPO | Admitting: Psychology

## 2023-05-04 DIAGNOSIS — F3341 Major depressive disorder, recurrent, in partial remission: Secondary | ICD-10-CM

## 2023-05-04 NOTE — Progress Notes (Signed)
PROGRESS NOTE: Annual Review  Name: [name] [name] Date: 05/04/2023 MRN: [id number] DOB: May 15, 1951 PCP: [name], FNP  Time spent:  Start: 12:00 PM End:  12:55 PM   Today I met with [name] in a remote video (Caregility) face to face individual psychotherapy.   Distance Site: Client's Home  Originating Site: Dr. Odette Horns Remote Office  Consent: Obtained verbal consent to transmit session remotely     Individualized Treatment Plan Strengths: verbal, motivated  Supports: family, spiritual beliefs   Goal/Needs for Treatment:  In order of importance to patient 1) Integrate and implement strategies and skills to better manage pain. 2) Increase the level of activity by identifying and engaging in values-consistent pleasurable activities. 3) Develop healthy thinking patterns and beliefs     Client Statement of Needs: I want to be happy and live a fuller life, enjoy being retired   Treatment Level: Biweekly Individual Outpatient Psychotherapy  Symptoms:Complains of generalized pain in many joints, muscles, and bones that debilitates normal functioning. Decrease or loss of appetite.  Depressed or irritable mood.  Exhibits signs and symptoms of depression. Experiences an increase in general physical discomfort (e.g., fatigue, insomnia, muscle tension, body aches). Experiences back, and leg pain Lack of energy.  Sleeplessness or hypersomnia.  Unresolved grief issues.   Client Treatment Preferences: female therapist   Healthcare consumer's goal for treatment:  Psychologist, Dr. Hilma [name], Ph.D. will support the patient's ability to achieve the goals identified. Cognitive Behavioral Therapy, Dialectical Behavioral Therapy, Motivational Interviewing, reminiscing and other evidenced-based practices will be used to promote progress towards healthy functioning.   Healthcare consumer, [name] will: Actively participate in therapy, working towards  healthy functioning.    *Justification for Continuation/Discontinuation of Goal: R=Revised, O=Ongoing, A=Achieved, D=Discontinued  Goal 1) Integrate and implement all new mental, somatic, and behavioral ways of managing pain.  Target Date Goal Was reviewed Status Code Progress towards goal/5 pt Likert rating  3/12/202 02/23/2023          O 3/5 - Kashira has learned and implements some skills inconsistently             Goal 2) Develop healthy thinking patterns and beliefs about self, others, and the world that lead to  the alleviation and help prevent the relapse of depression.  Target Date Goal Was reviewed Status Code Progress towards goal/5 pt Likert rating  02/23/2024 02/23/2023          O 3/5 - Pt continues to progress in her ability to shift to a more positive mind set.             Goal 3) Increase the level and range of activity by identifying and engaging in values-consistent  pleasurable activities.  Target Date Goal Was reviewed Status Code Progress towards goal/5 pt Likert rating  02/23/2024 02/23/2023           O 3/5 - Pt has been more involved with the care of her grandsons and has found it to be very gratifying.  She has improved in her ability to set limits around her availability.             This plan has been reviewed and created by the following participants:  This plan will be reviewed  at least every 12 months. Date Behavioral Health Clinician Date Guardian/Patient   02/23/2023 [name] [name], Ph.D.  02/23/2023 Dorann Lodge Rieke  Diagnosis: Major Depressive Disorder, recurrent, partial remission Phase of Life Problem   [name] [name] reports that she has finally figured out how to eat with her new teeth.  However, she admitted that she only uses them when she goes out.  I noted that not wearing them most days makes it harder to get used to them.  [name] [name] states that she is meeting with her surgeon tomorrow to d/ surgery on her shoulder.  We d/ how she is  anticipating good results but that it Ureta six months of recovery.    [name] [name] states that Mother's Day she didn't think of her mother at all and later she was amazed.  We agreed that she was doing well enough to take a break from therapy.  She knows she can call anytime she wants re-start therapy.   [name] Favors, PhD   Grandsons:  [place] (7) Cannon (3) Crew (1)

## 2023-05-05 ENCOUNTER — Ambulatory Visit: Payer: Medicare PPO | Admitting: Orthopedic Surgery

## 2023-05-05 ENCOUNTER — Encounter: Payer: Self-pay | Admitting: Orthopedic Surgery

## 2023-05-05 DIAGNOSIS — M75101 Unspecified rotator cuff tear or rupture of right shoulder, not specified as traumatic: Secondary | ICD-10-CM | POA: Diagnosis not present

## 2023-05-05 NOTE — Progress Notes (Unsigned)
Office Visit Note   Patient: [name] [name]           Date of Birth: 1951-09-17           MRN: [id number] Visit Date: 05/05/2023 Requested by: [name], FNP [address] [place],  [place] [address] PCP: [name], FNP  Subjective: Chief Complaint  Patient presents with   Right Shoulder - Pain    HPI: [name] [name] is a 72 y.o. female who presents to the office reporting right shoulder pain.  Patient has been taking tramadol.  Pain does radiate to her forearm.  She has had a series of injections which gave her temporary relief.  MRI scan of the shoulder is reviewed with the patient and it does show full-thickness partial width tear of the supraspinatus tendon with about 1 and half centimeters of retraction.  Does have some tendinosis also of the long head of the biceps tendon.  Moderate glenohumeral and AC joint arthritis is also present.  Patient describes both pain and weakness related to her right shoulder which has been going on for years..                ROS: All systems reviewed are negative as they relate to the chief complaint within the history of present illness.  Patient denies fevers or chills.  Assessment & Plan: Visit Diagnoses:  1. Tear of right supraspinatus tendon     Plan: Impression is right shoulder rotator cuff tear with arthritis which does not look that bad on my review of the MRI scan.  Discussed with Naseem the risk and benefits of surgical versus nonsurgical management.  Surgical management would include arthroscopy with biceps tendon release tenodesis and mini open rotator cuff tear repair.  I do not think her AC joint is particularly symptomatic at this time.  Patient understands the risk and benefits including not limited to infection shoulder stiffness incomplete pain relief particularly if arthritis is a bigger component than what it appears to be on the MRI scan.  The extensive nature of the rehabilitative  process is also discussed.  All questions answered.  He will need cardiac restratification from Dr. Jacinto Halim.  All questions answered.  Follow-Up Instructions: No follow-ups on file.   Orders:  No orders of the defined types were placed in this encounter.  No orders of the defined types were placed in this encounter.     Procedures: No procedures performed   Clinical Data: No additional findings.  Objective: Vital Signs: There were no vitals taken for this visit.  Physical Exam:  Constitutional: Patient appears well-developed HEENT:  Head: Normocephalic Eyes:EOM are normal Neck: Normal range of motion Cardiovascular: Normal rate Pulmonary/chest: Effort normal Neurologic: Patient is alert Skin: Skin is warm Psychiatric: Patient has normal mood and affect  Ortho Exam: Ortho exam demonstrates slight weakness to supraspinatus and infraspinatus testing on the right compared to the left.  Subscap strength is intact.  Does have tenderness to bicipital palpation of the bicipital groove but no discrete asymmetric AC joint tenderness.  No popping is noted with internal/external rotation at 90 degrees of abduction.  Deltoid is functional.  Cervical spine range of motion is full.  Subscap strength 5+ out of 5.  Specialty Comments:  No specialty comments available.  Imaging: No results found.   PMFS History: Patient Active Problem List   Diagnosis Date Noted   Claudication in peripheral vascular disease (HCC) 08/26/2020   Neurogenic claudication  07/10/2020   Degenerative lumbar spinal stenosis 05/16/2020   Sacroiliac pain 08/10/2019   Anxiety and depression 05/11/2019   Chest pain 05/09/2019   Arthritis of sacroiliac joint of both sides 05/01/2019   Polyarthralgia 12/28/2018   Preventative health care 10/20/2018   RLS (restless legs syndrome) 09/27/2018   Insomnia disorder related to known organic factor 06/27/2018   PLMD (periodic limb movement disorder) 06/27/2018   Hot  flashes due to menopause 04/25/2018   Palpitations 04/25/2018   Diaphoresis 04/25/2018   Long term current use of antithrombotics/antiplatelets 04/19/2018   Insomnia 04/19/2018   Intractable episodic headache 02/28/2018   Numbness and tingling of right arm 02/28/2018   Toe pain, right 10/19/2017   Rotator cuff arthropathy of left shoulder 06/02/2017   Trigger thumb of left hand 06/02/2017   Lipoma 05/06/2017   Pain of left thumb 05/06/2017   Chronic pain syndrome 12/15/2016   Lumbar degenerative disc disease 12/15/2016   Left leg pain 01/28/2016   Left shoulder pain 04/09/2015   Impaired glucose tolerance 07/30/2013   Abnormal breath sounds 07/30/2013   Asymptomatic bilateral carotid artery stenosis 05/05/2012   Anxiety 04/03/2011   CVA (cerebral infarction) 03/15/2011   HLD (hyperlipidemia) 11/03/2010   Hx of adenomatous colonic polyps 02/09/2008   Hypothyroidism 11/06/2007   ALLERGIC RHINITIS 11/06/2007   OSTEOPOROSIS 11/06/2007   ALCOHOL ABUSE, HX OF 11/06/2007   Essential hypertension 09/13/2007   Asthma 09/13/2007   PERIMENOPAUSAL STATUS 09/13/2007   Chronic insomnia 09/13/2007   Past Medical History:  Diagnosis Date   Acid reflux    ALCOHOL ABUSE, HX OF 11/06/2007   ALLERGIC RHINITIS 11/06/2007   Anxiety 04/03/2011   ASTHMA 09/13/2007   Asthma    Chronic pain syndrome 12/15/2016   COLONIC POLYPS, HX OF 02/09/2008    ADENOMATOUS POLYP   Encounter for well adult exam without abnormal findings 04/03/2011   HYPERLIPIDEMIA 11/03/2010   HYPERTENSION 09/13/2007   HYPOTHYROIDISM 11/06/2007   Impaired glucose tolerance 07/30/2013   INSOMNIA, HX OF 09/13/2007   Lumbar degenerative disc disease 12/15/2016   OSTEOPOROSIS 11/06/2007   PERIMENOPAUSAL STATUS 09/13/2007   RLS (restless legs syndrome)    Stroke (HCC)    TIA (transient ischemic attack) 11/04/2011    Family History  Problem Relation Age of Onset   Hypertension Mother 33   Heart disease Mother    Heart failure  Mother 62   Hypertension Brother    Colon cancer Brother 60   Dementia Maternal Grandmother    Breast cancer Cousin    Hyperlipidemia Other    Hypertension Other    Coronary artery disease Other     Past Surgical History:  Procedure Laterality Date   ABDOMINAL AORTOGRAM W/LOWER EXTREMITY N/A 08/27/2020   Procedure: ABDOMINAL AORTOGRAM W/LOWER EXTREMITY;  Surgeon: Elder Negus, MD;  Location: [facility];  Service: Cardiovascular;  Laterality: N/A;   BREAST EXCISIONAL BIOPSY Left    BREAST SURGERY  2008 and 2012   x 2 - benign, left side   carotid artery surgery Left    CESAREAN SECTION     x 3   COLONOSCOPY  multiple   2010   PERIPHERAL VASCULAR BALLOON ANGIOPLASTY  08/27/2020   Procedure: PERIPHERAL VASCULAR BALLOON ANGIOPLASTY;  Surgeon: Elder Negus, MD;  Location: [facility];  Service: Cardiovascular;;  Right SFA scoring balloon   TRANSFORAMINAL LUMBAR INTERBODY FUSION (TLIF) WITH PEDICLE SCREW FIXATION 1 LEVEL Left 07/10/2020   Procedure: LEFT-SIDED LUMBAR FOUR-FIVE TRANSFORAMINAL LUMBAR INTERBODY FUSION  WITH INSTRUMENTATION AND ALLOGRAFT;  Surgeon: Estill Bamberg, MD;  Location: [facility] OR;  Service: Orthopedics;  Laterality: Left;   Social History   Occupational History   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

## 2023-05-11 ENCOUNTER — Encounter: Payer: Self-pay | Admitting: Cardiology

## 2023-05-18 ENCOUNTER — Ambulatory Visit: Payer: Medicare PPO | Admitting: Psychology

## 2023-05-19 ENCOUNTER — Other Ambulatory Visit: Payer: Self-pay | Admitting: Surgical

## 2023-05-19 ENCOUNTER — Telehealth: Payer: Self-pay | Admitting: Sports Medicine

## 2023-05-19 MED ORDER — TRAMADOL HCL 50 MG PO TABS: 50.0000 mg | ORAL_TABLET | Freq: Every evening | ORAL | 0 refills | Status: DC | PRN

## 2023-05-19 NOTE — Telephone Encounter (Signed)
Patient called needing Rx refilled Tramadol. The number to contact patient is [phone]

## 2023-05-19 NOTE — Telephone Encounter (Signed)
Sent in rx for tramadol qhs prn

## 2023-05-20 ENCOUNTER — Telehealth: Payer: Self-pay | Admitting: Orthopedic Surgery

## 2023-05-20 NOTE — Telephone Encounter (Signed)
Lvm advising  

## 2023-05-20 NOTE — Telephone Encounter (Signed)
Patient is scheduled for right shoulder arthroscopy, debridement, mini open rotator cuff tear repair, biceps tenodesis at [facility] on 06-08-23.  Patient has been cleared by Dr. Jacinto Halim and considered low risk from cardiac standpoint for this surgery.  Patient  can hold Plavix 5 days prior to procedure if applicable , and can resume 3-5 days post procedure.    Patient is calling because she was under the impression a total shoulder was been done instead of a scope and repair.  Patient would like to talk to the doctor about a total shoulder surgery.  [phone]

## 2023-05-20 NOTE — Telephone Encounter (Signed)
I called and we discussed that she is going to get a rotator cuff repair and biceps tenodesis.  Basically she did not want to have 2 surgeries.  I do think that with the amount of arthritis that is present which is minimal on the MRI scan this could be her single surgery that she would need.  We are good to go.

## 2023-05-21 NOTE — Telephone Encounter (Signed)
FYI

## 2023-05-28 NOTE — Pre-Procedure Instructions (Addendum)
Surgical Instructions    Your procedure is scheduled on June 08, 2023.  Report to [facility] Entrance "A" at 5:30 A.M., then check in with the Admitting office.  Call this number if you have problems the morning of surgery:  [phone]  If you have any questions prior to your surgery date call [phone]: Open Monday-Friday 8am-4pm If you experience any cold or flu symptoms such as cough, fever, chills, shortness of breath, etc. between now and your scheduled surgery, please notify us at the above number.     Remember:  Do not eat after midnight the night before your surgery  You may drink clear liquids until 4:30 AM the morning of your surgery.   Clear liquids allowed are: Water, Non-Citrus Juices (without pulp), Carbonated Beverages, Clear Tea, Black Coffee Only (NO MILK, CREAM OR POWDERED CREAMER of any kind), and Gatorade.  Patient Instructions  The night before surgery:  No food after midnight. ONLY clear liquids after midnight  The day of surgery (if you do NOT have diabetes):  Drink ONE (1) Pre-Surgery Clear Ensure by 4:30 AM the morning of surgery. Drink in one sitting. Do not sip.  This drink was given to you during your hospital  pre-op appointment visit.  Nothing else to drink after completing the  Pre-Surgery Clear Ensure.         If you have questions, please contact your surgeon's office.     Take these medicines the morning of surgery with A SIP OF WATER: citalopram (CELEXA)  diltiazem (CARDIZEM CD)   levothyroxine (SYNTHROID)   montelukast (SINGULAIR)   rosuvastatin (CRESTOR)    May take these medicines IF NEEDED:  fluticasone-salmeterol (ADVAIR DISKUS) inhaler  methocarbamol (ROBAXIN)   pantoprazole (PROTONIX)   traMADol (ULTRAM)    Follow your surgeon's instructions on when to stop clopidogrel (PLAVIX).  If no instructions were given by your surgeon then you will need to call the office to get those instructions.     As of today, STOP  taking any Aspirin (unless otherwise instructed by your surgeon) Aleve, Naproxen, Ibuprofen, Motrin, Advil, Goody's, BC's, all herbal medications, fish oil, and all vitamins.                     Do NOT Smoke (Tobacco/Vaping) for 24 hours prior to your procedure.  If you use a CPAP at night, you may bring your mask/headgear for your overnight stay.   Contacts, glasses, piercing's, hearing aid's, dentures or partials may not be worn into surgery, please bring cases for these belongings.    For patients admitted to the hospital, discharge time will be determined by your treatment team.   Patients discharged the day of surgery will not be allowed to drive home, and someone needs to stay with them for 24 hours.  SURGICAL WAITING ROOM VISITATION Patients having surgery or a procedure may have no more than 2 support people in the waiting area - these visitors may rotate.   Children under the age of 71 must have an adult with them who is not the patient. If the patient needs to stay at the hospital during part of their recovery, the visitor guidelines for inpatient rooms apply. Pre-op nurse will coordinate an appropriate time for 1 support person to accompany patient in pre-op.  This support person may not rotate.   Please refer to the [facility] website for the visitor guidelines for Inpatients (after your surgery is over and you are in a regular room).  Special instructions:   [facility]- Preparing For Surgery  Before surgery, you can play an important role. Because skin is not sterile, your skin needs to be as free of germs as possible. You can reduce the number of germs on your skin by washing with CHG (chlorahexidine gluconate) Soap before surgery.  CHG is an antiseptic cleaner which kills germs and bonds with the skin to continue killing germs even after washing.    Oral Hygiene is also important to reduce your risk of infection.  Remember - BRUSH YOUR TEETH THE MORNING OF SURGERY WITH  YOUR REGULAR TOOTHPASTE  Please do not use if you have an allergy to CHG or antibacterial soaps. If your skin becomes reddened/irritated stop using the CHG.  Do not shave (including legs and underarms) for at least 48 hours prior to first CHG shower. It is OK to shave your face.  Please follow these instructions carefully.   Shower the NIGHT BEFORE SURGERY and the MORNING OF SURGERY  If you chose to wash your hair, wash your hair first as usual with your normal shampoo.  After you shampoo, rinse your hair and body thoroughly to remove the shampoo.  Use CHG Soap as you would any other liquid soap. You can apply CHG directly to the skin and wash gently with a scrungie or a clean washcloth.   Apply the CHG Soap to your body ONLY FROM THE NECK DOWN.  Do not use on open wounds or open sores. Avoid contact with your eyes, ears, mouth and genitals (private parts). Wash Face and genitals (private parts)  with your normal soap.   Wash thoroughly, paying special attention to the area where your surgery will be performed.  Thoroughly rinse your body with warm water from the neck down.  DO NOT shower/wash with your normal soap after using and rinsing off the CHG Soap.  Pat yourself dry with a CLEAN TOWEL.  Wear CLEAN PAJAMAS to bed the night before surgery  Place CLEAN SHEETS on your bed the night before your surgery  DO NOT SLEEP WITH PETS.   Day of Surgery: Take a shower with CHG soap. Do not wear jewelry or makeup Do not wear lotions, powders, perfumes/colognes, or deodorant. Do not shave 48 hours prior to surgery.  Men may shave face and neck. Do not bring valuables to the hospital.  [facility] is not responsible for any belongings or valuables. Do not wear nail polish, gel polish, artificial nails, or any other type of covering on natural nails (fingers and toes) If you have artificial nails or gel coating that need to be removed by a nail salon, please have this removed prior to  surgery. Artificial nails or gel coating may interfere with anesthesia's ability to adequately monitor your vital signs.  Wear Clean/Comfortable clothing the morning of surgery Remember to brush your teeth WITH YOUR REGULAR TOOTHPASTE.   Please read over the following fact sheets that you were given.    If you received a COVID test during your pre-op visit  it is requested that you wear a mask when out in public, stay away from anyone that may not be feeling well and notify your surgeon if you develop symptoms. If you have been in contact with anyone that has tested positive in the last 10 days please notify you surgeon.

## 2023-05-29 ENCOUNTER — Other Ambulatory Visit: Payer: Self-pay | Admitting: Family

## 2023-05-31 ENCOUNTER — Encounter (HOSPITAL_COMMUNITY): Payer: Self-pay

## 2023-05-31 ENCOUNTER — Other Ambulatory Visit: Payer: Self-pay

## 2023-05-31 ENCOUNTER — Encounter (HOSPITAL_COMMUNITY)
Admission: RE | Admit: 2023-05-31 | Discharge: 2023-05-31 | Disposition: A | Discharge status: Home / Self Care | Payer: Medicare PPO | Source: Ambulatory Visit | Attending: Orthopedic Surgery | Admitting: Orthopedic Surgery

## 2023-05-31 DIAGNOSIS — Z8673 Personal history of transient ischemic attack (TIA), and cerebral infarction without residual deficits: Secondary | ICD-10-CM | POA: Diagnosis not present

## 2023-05-31 DIAGNOSIS — E785 Hyperlipidemia, unspecified: Secondary | ICD-10-CM | POA: Diagnosis not present

## 2023-05-31 DIAGNOSIS — Z01812 Encounter for preprocedural laboratory examination: Secondary | ICD-10-CM | POA: Diagnosis not present

## 2023-05-31 DIAGNOSIS — I10 Essential (primary) hypertension: Secondary | ICD-10-CM | POA: Insufficient documentation

## 2023-05-31 DIAGNOSIS — I340 Nonrheumatic mitral (valve) insufficiency: Secondary | ICD-10-CM | POA: Insufficient documentation

## 2023-05-31 DIAGNOSIS — Z01818 Encounter for other preprocedural examination: Secondary | ICD-10-CM

## 2023-05-31 HISTORY — DX: Atherosclerotic heart disease of native coronary artery without angina pectoris: I25.10

## 2023-05-31 HISTORY — DX: Peripheral vascular disease, unspecified: I73.9

## 2023-05-31 LAB — BASIC METABOLIC PANEL
Anion gap: 10 (ref 5–15)
BUN: 14 mg/dL (ref 8–23)
CO2: 23 mmol/L (ref 22–32)
Calcium: 9.7 mg/dL (ref 8.9–10.3)
Chloride: 102 mmol/L (ref 98–111)
Creatinine, Ser: 0.99 mg/dL (ref 0.44–1.00)
GFR, Estimated: >60 mL/min (ref >60)
Glucose, Bld: 109 mg/dL — ABNORMAL HIGH (ref 70–99)
Potassium: 3.8 mmol/L (ref 3.5–5.1)
Sodium: 135 mmol/L (ref 135–145)

## 2023-05-31 LAB — CBC
HCT: 38.7 % (ref 36.0–46.0)
Hemoglobin: 12.7 g/dL (ref 12.0–15.0)
MCH: 30.5 pg (ref 26.0–34.0)
MCHC: 32.8 g/dL (ref 30.0–36.0)
MCV: 93 fL (ref 80.0–100.0)
Platelets: 233 10*3/uL (ref 150–400)
RBC: 4.16 MIL/uL (ref 3.87–5.11)
RDW: 12.8 % (ref 11.5–15.5)
WBC: 6.6 10*3/uL (ref 4.0–10.5)
nRBC: 0 % (ref 0.0–0.2)

## 2023-05-31 NOTE — Telephone Encounter (Signed)
Requesting: Ambien 10mg   Contract: None UDS: None  Last Visit: 11/17/22 Next Visit: None Last Refill: 03/04/23 #90 and 0RF  Please Advise

## 2023-05-31 NOTE — Progress Notes (Signed)
Anesthesia Chart Review:  Follows cardiology for history of left CEA for symptomatic carotid stenosis in 2012 with TIA, PAD with history of right SFA angioplasty, HTN, HLD, mild orthostasis.  Echo 12/2021 showed EF 55%, mild to moderate mitral regurgitation.  Nuclear stress test 12/2021 was read as intermediate risk, however, Dr. Jacinto Halim stated in note 01/30/2022 that it was clinically low risk.  Last seen by Dr. Jacinto Halim 04/12/2023 and stable at that time from cardiac standpoint.  Repeat carotid duplex 03/2023 showed mild stenosis, recommend repeat in 1 year.  She was cleared for surgery in letter dated 05/03/2023 stating, "Taleshia B Pitzer is at low risk, from a cardiac standpoint, for her upcoming procedure: Right shoulder arthroscopy under general anesthesia.  It is ok to proceed without further cardiac testing.If applicable can hold Plavix for 5 day(s) prior to procedure and re-start 3-5 days post procedure."  Patient reported she had not been given specific instructions on when to hold Plavix.  She was advised to reach out to surgeon's office.   EKG 01/29/2023: NSR.  Rate 65.  LAE.  Carotid artery duplex 03/16/2023: Duplex suggests stenosis in the right internal carotid artery (16-49%). There is a patent endarterectomy site starting at the left carotid ICA-prox extending into the ICA-mid.  <50% stenosis in the left common carotid vessels. Antegrade right vertebral artery flow. Antegrade left vertebral artery flow. No significant change from 03/04/2022. Follow up in one year is appropriate if clinically indicated.  Echocardiogram 01/06/2022: Left ventricle cavity is normal in size and wall thickness. Normal global wall motion. Normal LV systolic function with EF 55%. Normal diastolic filling pattern. Calculated EF 55%. Trileaflet aortic valve with mild aortic valve leaflet calcification. No significant stenosis or regurgitation. Mild mitral valve leaflet calcification. Mild to moderate mitral  regurgitation. Normal right atrial pressure.   Lexiscan Nuclear stress test 01/05/2022: Nondiagnostic ECG stress due to pharmacologic stress.  7/10 chest pian without EKG changes. Myocardial perfusion is normal. TID is mildly abnormal at 1.25. Overall LV systolic function is normal without regional wall motion abnormalities. Stress LV EF: 69%. Compared to previous study: 2019/06/26, Chest pain with Lexiscan infusion and TID now noted previously not present. Intermediate risk study.

## 2023-05-31 NOTE — Progress Notes (Signed)
PCP - Ria Clock, NP Cardiologist - Dr. Yates Decamp  PPM/ICD - denies   Chest x-ray - 12/09/21 EKG - 01/29/23 Stress Test - 01/05/22 ECHO - 01/05/22 Cardiac Cath - denies  Sleep Study - denies   DM- denies  Blood Thinner Instructions: Hold Plavix 5 days prior to surgery. Last dose 6/19 Aspirin Instructions: n/a  ERAS Protcol - yes PRE-SURGERY Ensure given at PAT  COVID TEST- n/a   Anesthesia review: yes, cardiac hx  Patient denies shortness of breath, fever, cough and chest pain at PAT appointment   All instructions explained to the patient, with a verbal understanding of the material. Patient agrees to go over the instructions while at home for a better understanding. The opportunity to ask questions was provided.

## 2023-06-01 ENCOUNTER — Ambulatory Visit: Payer: Medicare PPO | Admitting: Psychology

## 2023-06-01 NOTE — Progress Notes (Signed)
error 

## 2023-06-01 NOTE — Anesthesia Preprocedure Evaluation (Addendum)
Anesthesia Evaluation  Patient identified by MRN, date of birth, ID band Patient awake    Reviewed: Allergy & Precautions, H&P , NPO status , Patient's Chart, lab work & pertinent test results  Airway Mallampati: II   Neck ROM: full    Dental   Pulmonary asthma , former smoker   breath sounds clear to auscultation       Cardiovascular hypertension, + Peripheral Vascular Disease   Rhythm:regular Rate:Normal     Neuro/Psych  Headaches PSYCHIATRIC DISORDERS Anxiety Depression    CVA    GI/Hepatic ,GERD  ,,  Endo/Other  Hypothyroidism    Renal/GU      Musculoskeletal  (+) Arthritis ,    Abdominal   Peds  Hematology   Anesthesia Other Findings   Reproductive/Obstetrics                             Anesthesia Physical Anesthesia Plan  ASA: 3  Anesthesia Plan: General   Post-op Pain Management: Regional block*   Induction: Intravenous  PONV Risk Score and Plan: 3 and Ondansetron, Dexamethasone and Midazolam  Airway Management Planned: Oral ETT  Additional Equipment:   Intra-op Plan:   Post-operative Plan: Extubation in OR  Informed Consent: I have reviewed the patients History and Physical, chart, labs and discussed the procedure including the risks, benefits and alternatives for the proposed anesthesia with the patient or authorized representative who has indicated his/her understanding and acceptance.     Dental advisory given  Plan Discussed with: CRNA, Anesthesiologist and Surgeon  Anesthesia Plan Comments: (PAT note by Antionette Poles, PA-C: Follows cardiology for history of left CEA for symptomatic carotid stenosis in 2012 with TIA, PAD with history of right SFA angioplasty, HTN, HLD, mild orthostasis.  Echo 12/2021 showed EF 55%, mild to moderate mitral regurgitation.  Nuclear stress test 12/2021 was read as intermediate risk, however, Dr. Jacinto Halim stated in note 01/30/2022 that it was  clinically low risk.  Last seen by Dr. Jacinto Halim 04/12/2023 and stable at that time from cardiac standpoint.  Repeat carotid duplex 03/2023 showed mild stenosis, recommend repeat in 1 year.  She was cleared for surgery in letter dated 05/03/2023 stating, "Narissa B Wyss is at low risk, from a cardiac standpoint, for her upcoming procedure: Right shoulder arthroscopy under general anesthesia.  It is ok to proceed without further cardiac testing.If applicable can hold Plavix for 5 day(s) prior to procedure and re-start 3-5 days post procedure."  Last dose Plavix 06/02/2023.  History of asthma, maintained on montelukast and as needed Advair.  Preop labs reviewed, WNL.  EKG 01/29/2023: NSR.  Rate 65.  LAE.  Carotid artery duplex 03/16/2023: Duplex suggests stenosis in the right internal carotid artery (16-49%). There is a patent endarterectomy site starting at the left carotid ICA-prox extending into the ICA-mid.  <50% stenosis in the left common carotid vessels. Antegrade right vertebral artery flow. Antegrade left vertebral artery flow. No significant change from 03/04/2022. Follow up in one year is appropriate if clinically indicated.  Echocardiogram 01/06/2022: Left ventricle cavity is normal in size and wall thickness. Normal global wall motion. Normal LV systolic function with EF 55%. Normal diastolic filling pattern. Calculated EF 55%. Trileaflet aortic valve with mild aortic valve leaflet calcification. No significant stenosis or regurgitation. Mild mitral valve leaflet calcification. Mild to moderate mitral regurgitation. Normal right atrial pressure.   Lexiscan Nuclear stress test 01/05/2022: Nondiagnostic ECG stress due to pharmacologic stress.  7/10 chest pian  without EKG changes. Myocardial perfusion is normal. TID is mildly abnormal at 1.25. Overall LV systolic function is normal without regional wall motion abnormalities. Stress LV EF: 69%. Compared to previous study: 2019/06/26, Chest  pain with Lexiscan infusion and TID now noted previously not present. Intermediate risk study.  )        Anesthesia Quick Evaluation

## 2023-06-08 ENCOUNTER — Ambulatory Visit (HOSPITAL_BASED_OUTPATIENT_CLINIC_OR_DEPARTMENT_OTHER): Payer: Medicare PPO | Admitting: Certified Registered Nurse Anesthetist

## 2023-06-08 ENCOUNTER — Ambulatory Visit (HOSPITAL_COMMUNITY)
Admission: RE | Admit: 2023-06-08 | Discharge: 2023-06-08 | Disposition: A | Discharge status: Home / Self Care | Payer: Medicare PPO | Attending: Orthopedic Surgery | Admitting: Orthopedic Surgery

## 2023-06-08 ENCOUNTER — Ambulatory Visit (HOSPITAL_COMMUNITY): Payer: Medicare PPO | Admitting: Physician Assistant

## 2023-06-08 ENCOUNTER — Encounter (HOSPITAL_COMMUNITY)
Admission: RE | Disposition: A | Discharge status: Home / Self Care | Payer: Self-pay | Source: Home / Self Care | Attending: Orthopedic Surgery

## 2023-06-08 ENCOUNTER — Other Ambulatory Visit: Payer: Self-pay

## 2023-06-08 ENCOUNTER — Encounter (HOSPITAL_COMMUNITY): Payer: Self-pay | Admitting: Orthopedic Surgery

## 2023-06-08 DIAGNOSIS — M94211 Chondromalacia, right shoulder: Secondary | ICD-10-CM | POA: Insufficient documentation

## 2023-06-08 DIAGNOSIS — M75121 Complete rotator cuff tear or rupture of right shoulder, not specified as traumatic: Secondary | ICD-10-CM

## 2023-06-08 DIAGNOSIS — F418 Other specified anxiety disorders: Secondary | ICD-10-CM | POA: Diagnosis not present

## 2023-06-08 DIAGNOSIS — J45909 Unspecified asthma, uncomplicated: Secondary | ICD-10-CM | POA: Insufficient documentation

## 2023-06-08 DIAGNOSIS — Z87891 Personal history of nicotine dependence: Secondary | ICD-10-CM | POA: Diagnosis not present

## 2023-06-08 DIAGNOSIS — M65911 Unspecified synovitis and tenosynovitis, right shoulder: Secondary | ICD-10-CM

## 2023-06-08 DIAGNOSIS — M7521 Bicipital tendinitis, right shoulder: Secondary | ICD-10-CM

## 2023-06-08 DIAGNOSIS — I739 Peripheral vascular disease, unspecified: Secondary | ICD-10-CM | POA: Diagnosis not present

## 2023-06-08 DIAGNOSIS — Z8249 Family history of ischemic heart disease and other diseases of the circulatory system: Secondary | ICD-10-CM | POA: Diagnosis not present

## 2023-06-08 DIAGNOSIS — I10 Essential (primary) hypertension: Secondary | ICD-10-CM

## 2023-06-08 DIAGNOSIS — M19011 Primary osteoarthritis, right shoulder: Secondary | ICD-10-CM | POA: Insufficient documentation

## 2023-06-08 DIAGNOSIS — E785 Hyperlipidemia, unspecified: Secondary | ICD-10-CM | POA: Diagnosis not present

## 2023-06-08 DIAGNOSIS — M75101 Unspecified rotator cuff tear or rupture of right shoulder, not specified as traumatic: Secondary | ICD-10-CM | POA: Diagnosis not present

## 2023-06-08 DIAGNOSIS — Z01818 Encounter for other preprocedural examination: Secondary | ICD-10-CM

## 2023-06-08 DIAGNOSIS — M659 Synovitis and tenosynovitis, unspecified: Secondary | ICD-10-CM

## 2023-06-08 DIAGNOSIS — G8918 Other acute postprocedural pain: Secondary | ICD-10-CM | POA: Diagnosis not present

## 2023-06-08 DIAGNOSIS — E039 Hypothyroidism, unspecified: Secondary | ICD-10-CM | POA: Diagnosis not present

## 2023-06-08 DIAGNOSIS — Z8673 Personal history of transient ischemic attack (TIA), and cerebral infarction without residual deficits: Secondary | ICD-10-CM | POA: Insufficient documentation

## 2023-06-08 DIAGNOSIS — K219 Gastro-esophageal reflux disease without esophagitis: Secondary | ICD-10-CM | POA: Diagnosis not present

## 2023-06-08 HISTORY — PX: SHOULDER ARTHROSCOPY WITH SUBACROMIAL DECOMPRESSION, ROTATOR CUFF REPAIR AND BICEP TENDON REPAIR: SHX5687

## 2023-06-08 SURGERY — SHOULDER ARTHROSCOPY WITH SUBACROMIAL DECOMPRESSION, ROTATOR CUFF REPAIR AND BICEP TENDON REPAIR
Anesthesia: General | Laterality: Right

## 2023-06-08 MED ORDER — DEXAMETHASONE SODIUM PHOSPHATE 10 MG/ML IJ SOLN
INTRAMUSCULAR | Status: AC
  Filled 2023-06-08: qty 1

## 2023-06-08 MED ORDER — CHLORHEXIDINE GLUCONATE 0.12 % MT SOLN
15.0000 mL | Freq: Once | OROMUCOSAL | Status: AC
  Administered 2023-06-08: 15 mL via OROMUCOSAL
  Filled 2023-06-08: qty 15

## 2023-06-08 MED ORDER — EPINEPHRINE PF 1 MG/ML IJ SOLN
INTRAMUSCULAR | Status: AC
  Filled 2023-06-08: qty 2

## 2023-06-08 MED ORDER — CEFAZOLIN SODIUM-DEXTROSE 2-4 GM/100ML-% IV SOLN
2.0000 g | INTRAVENOUS | Status: AC
  Administered 2023-06-08: 2 g via INTRAVENOUS
  Filled 2023-06-08: qty 100

## 2023-06-08 MED ORDER — ONDANSETRON HCL 4 MG/2ML IJ SOLN
INTRAMUSCULAR | Status: AC
  Filled 2023-06-08: qty 2

## 2023-06-08 MED ORDER — FENTANYL CITRATE (PF) 250 MCG/5ML IJ SOLN
INTRAMUSCULAR | Status: AC
  Filled 2023-06-08: qty 5

## 2023-06-08 MED ORDER — FENTANYL CITRATE (PF) 100 MCG/2ML IJ SOLN: 25.0000 ug | INTRAMUSCULAR | Status: DC | PRN

## 2023-06-08 MED ORDER — BUPIVACAINE HCL (PF) 0.5 % IJ SOLN
INTRAMUSCULAR | Status: DC | PRN
  Administered 2023-06-08: 15 mL via PERINEURAL

## 2023-06-08 MED ORDER — PHENYLEPHRINE 80 MCG/ML (10ML) SYRINGE FOR IV PUSH (FOR BLOOD PRESSURE SUPPORT)
PREFILLED_SYRINGE | INTRAVENOUS | Status: DC | PRN
  Administered 2023-06-08 (×3): 80 ug via INTRAVENOUS

## 2023-06-08 MED ORDER — SUGAMMADEX SODIUM 200 MG/2ML IV SOLN
INTRAVENOUS | Status: DC | PRN
  Administered 2023-06-08: 300 mg via INTRAVENOUS

## 2023-06-08 MED ORDER — PHENYLEPHRINE HCL-NACL 20-0.9 MG/250ML-% IV SOLN
INTRAVENOUS | Status: DC | PRN
  Administered 2023-06-08: 20 ug/min via INTRAVENOUS

## 2023-06-08 MED ORDER — MIDAZOLAM HCL 2 MG/2ML IJ SOLN
INTRAMUSCULAR | Status: DC | PRN
  Administered 2023-06-08: 1 mg via INTRAVENOUS

## 2023-06-08 MED ORDER — OXYCODONE HCL 5 MG PO TABS: 5.0000 mg | ORAL_TABLET | ORAL | 0 refills | Status: DC | PRN

## 2023-06-08 MED ORDER — MIDAZOLAM HCL 2 MG/2ML IJ SOLN
INTRAMUSCULAR | Status: AC
  Filled 2023-06-08: qty 2

## 2023-06-08 MED ORDER — SODIUM CHLORIDE 0.9 % IR SOLN
Status: DC | PRN
  Administered 2023-06-08: 3000 mL
  Administered 2023-06-08: 1000 mL

## 2023-06-08 MED ORDER — OXYCODONE HCL 5 MG PO TABS
5.0000 mg | ORAL_TABLET | Freq: Once | ORAL | Status: AC | PRN
  Administered 2023-06-08: 5 mg via ORAL

## 2023-06-08 MED ORDER — ORAL CARE MOUTH RINSE: 15.0000 mL | Freq: Once | OROMUCOSAL | Status: AC

## 2023-06-08 MED ORDER — LACTATED RINGERS IV SOLN: INTRAVENOUS | Status: DC

## 2023-06-08 MED ORDER — DEXAMETHASONE SODIUM PHOSPHATE 10 MG/ML IJ SOLN
INTRAMUSCULAR | Status: DC | PRN
  Administered 2023-06-08: 5 mg via INTRAVENOUS

## 2023-06-08 MED ORDER — ONDANSETRON HCL 4 MG/2ML IJ SOLN
INTRAMUSCULAR | Status: DC | PRN
  Administered 2023-06-08: 4 mg via INTRAVENOUS

## 2023-06-08 MED ORDER — LIDOCAINE 2% (20 MG/ML) 5 ML SYRINGE
INTRAMUSCULAR | Status: AC
  Filled 2023-06-08: qty 5

## 2023-06-08 MED ORDER — PROPOFOL 10 MG/ML IV BOLUS
INTRAVENOUS | Status: DC | PRN
  Administered 2023-06-08: 110 mg via INTRAVENOUS

## 2023-06-08 MED ORDER — OXYCODONE HCL 5 MG/5ML PO SOLN: 5.0000 mg | Freq: Once | ORAL | Status: AC | PRN

## 2023-06-08 MED ORDER — PROPOFOL 10 MG/ML IV BOLUS
INTRAVENOUS | Status: AC
  Filled 2023-06-08: qty 20

## 2023-06-08 MED ORDER — ROCURONIUM BROMIDE 10 MG/ML (PF) SYRINGE
PREFILLED_SYRINGE | INTRAVENOUS | Status: AC
  Filled 2023-06-08: qty 10

## 2023-06-08 MED ORDER — LIDOCAINE 2% (20 MG/ML) 5 ML SYRINGE
INTRAMUSCULAR | Status: DC | PRN
  Administered 2023-06-08: 50 mg via INTRAVENOUS

## 2023-06-08 MED ORDER — BUPIVACAINE LIPOSOME 1.3 % IJ SUSP
INTRAMUSCULAR | Status: DC | PRN
  Administered 2023-06-08: 10 mL via PERINEURAL

## 2023-06-08 MED ORDER — OXYCODONE HCL 5 MG PO TABS
ORAL_TABLET | ORAL | Status: AC
  Filled 2023-06-08: qty 1

## 2023-06-08 MED ORDER — FENTANYL CITRATE (PF) 250 MCG/5ML IJ SOLN
INTRAMUSCULAR | Status: DC | PRN
  Administered 2023-06-08 (×2): 50 ug via INTRAVENOUS

## 2023-06-08 MED ORDER — ONDANSETRON HCL 4 MG/2ML IJ SOLN: 4.0000 mg | Freq: Four times a day (QID) | INTRAMUSCULAR | Status: DC | PRN

## 2023-06-08 MED ORDER — ROCURONIUM BROMIDE 10 MG/ML (PF) SYRINGE
PREFILLED_SYRINGE | INTRAVENOUS | Status: DC | PRN
  Administered 2023-06-08: 50 mg via INTRAVENOUS
  Administered 2023-06-08: 20 mg via INTRAVENOUS

## 2023-06-08 SURGICAL SUPPLY — 70 items
AID PSTN UNV HD RSTRNT DISP (MISCELLANEOUS) ×1
ANCH SUT 2 FBRTK KNTLS 1.8 (Anchor) ×1 IMPLANT
ANCH SUT 2 SWLK 19.1 CLS EYLT (Anchor) ×2 IMPLANT
ANCH SUT FBRTK 1.3 2 TPE (Anchor) ×2 IMPLANT
ANCHOR FBRTK 2.6 SUTURETAP 1.3 (Anchor) IMPLANT
ANCHOR SUT 1.8 FIBERTAK SB KL (Anchor) IMPLANT
ANCHOR SWIVELOCK BIO 4.75X19.1 (Anchor) IMPLANT
BAG COUNTER SPONGE SURGICOUNT (BAG) IMPLANT
BAG SPNG CNTER NS LX DISP (BAG)
BLADE EXCALIBUR 4.0X13 (MISCELLANEOUS) IMPLANT
BURR OVAL 8 FLU 4.0X13 (MISCELLANEOUS) IMPLANT
COVER SURGICAL LIGHT HANDLE (MISCELLANEOUS) ×2 IMPLANT
DRAPE INCISE IOBAN 66X45 STRL (DRAPES) ×4 IMPLANT
DRAPE STERI 35X30 U-POUCH (DRAPES) ×2 IMPLANT
DRAPE U-SHAPE 47X51 STRL (DRAPES) ×4 IMPLANT
DRSG TEGADERM 4X4.5 CHG (GAUZE/BANDAGES/DRESSINGS) IMPLANT
DRSG TEGADERM 4X4.75 (GAUZE/BANDAGES/DRESSINGS) ×6 IMPLANT
DURAPREP 26ML APPLICATOR (WOUND CARE) ×2 IMPLANT
DW OUTFLOW CASSETTE/TUBE SET (MISCELLANEOUS) ×2 IMPLANT
ELECT REM PT RETURN 9FT ADLT (ELECTROSURGICAL) ×1
ELECTRODE REM PT RTRN 9FT ADLT (ELECTROSURGICAL) ×2 IMPLANT
GAUZE SPONGE 4X4 12PLY STRL (GAUZE/BANDAGES/DRESSINGS) ×2 IMPLANT
GAUZE SPONGE 4X4 12PLY STRL LF (GAUZE/BANDAGES/DRESSINGS) ×2 IMPLANT
GLOVE BIOGEL PI IND STRL 7.0 (GLOVE) ×2 IMPLANT
GLOVE BIOGEL PI IND STRL 8 (GLOVE) ×2 IMPLANT
GLOVE ECLIPSE 7.0 STRL STRAW (GLOVE) ×2 IMPLANT
GLOVE ECLIPSE 8.0 STRL XLNG CF (GLOVE) ×2 IMPLANT
GOWN STRL REUS W/ TWL LRG LVL3 (GOWN DISPOSABLE) ×6 IMPLANT
GOWN STRL REUS W/TWL LRG LVL3 (GOWN DISPOSABLE) ×3
KIT BASIN OR (CUSTOM PROCEDURE TRAY) ×2 IMPLANT
KIT STR SPEAR 1.8 FBRTK DISP (KITS) IMPLANT
KIT TURNOVER KIT B (KITS) ×2 IMPLANT
MANIFOLD NEPTUNE II (INSTRUMENTS) ×2 IMPLANT
NDL SCORPION MULTI FIRE (NEEDLE) IMPLANT
NDL SPNL 18GX3.5 QUINCKE PK (NEEDLE) ×2 IMPLANT
NDL SUT 6 .5 CRC .975X.05 MAYO (NEEDLE) IMPLANT
NEEDLE MAYO TAPER (NEEDLE)
NEEDLE SCORPION MULTI FIRE (NEEDLE) ×1 IMPLANT
NEEDLE SPNL 18GX3.5 QUINCKE PK (NEEDLE) ×1 IMPLANT
NS IRRIG 1000ML POUR BTL (IV SOLUTION) ×2 IMPLANT
PACK SHOULDER (CUSTOM PROCEDURE TRAY) ×2 IMPLANT
PAD ARMBOARD 7.5X6 YLW CONV (MISCELLANEOUS) ×4 IMPLANT
PROBE APOLLO 90XL (SURGICAL WAND) ×2 IMPLANT
RESTRAINT HEAD UNIVERSAL NS (MISCELLANEOUS) ×2 IMPLANT
SLING ARM FOAM STRAP MED (SOFTGOODS) IMPLANT
SLING ARM IMMOBILIZER LRG (SOFTGOODS) IMPLANT
SPONGE T-LAP 4X18 ~~LOC~~+RFID (SPONGE) ×4 IMPLANT
STRIP CLOSURE SKIN 1/2X4 (GAUZE/BANDAGES/DRESSINGS) ×2 IMPLANT
SUCTION TUBE FRAZIER 10FR DISP (SUCTIONS) ×2 IMPLANT
SUT ETHILON 3 0 PS 1 (SUTURE) ×2 IMPLANT
SUT FIBERWIRE #2 38 T-5 BLUE (SUTURE)
SUT MNCRL AB 3-0 PS2 18 (SUTURE) ×2 IMPLANT
SUT VIC AB 0 CT1 27 (SUTURE) ×1
SUT VIC AB 0 CT1 27XBRD ANBCTR (SUTURE) ×2 IMPLANT
SUT VIC AB 1 CT1 27 (SUTURE)
SUT VIC AB 1 CT1 27XBRD ANBCTR (SUTURE) IMPLANT
SUT VIC AB 2-0 CT1 27 (SUTURE) ×2
SUT VIC AB 2-0 CT1 TAPERPNT 27 (SUTURE) ×2 IMPLANT
SUT VICRYL 0 UR6 27IN ABS (SUTURE) ×2 IMPLANT
SUT VICRYL 1 TIES 12X18 (SUTURE) ×2 IMPLANT
SUTURE FIBERWR #2 38 T-5 BLUE (SUTURE) IMPLANT
SUTURE TAPE 1.3 FIBERLOP 20 ST (SUTURE) IMPLANT
SUTURETAPE 1.3 FIBERLOOP 20 ST (SUTURE)
SYS FBRTK BUTTON 2.6 (Anchor) ×1 IMPLANT
SYSTEM FBRTK BUTTON 2.6 (Anchor) IMPLANT
TAPE CLOTH 4X10 WHT NS (GAUZE/BANDAGES/DRESSINGS) IMPLANT
TOWEL GREEN STERILE (TOWEL DISPOSABLE) ×2 IMPLANT
TOWEL GREEN STERILE FF (TOWEL DISPOSABLE) ×2 IMPLANT
TUBING ARTHROSCOPY IRRIG 16FT (MISCELLANEOUS) ×2 IMPLANT
WATER STERILE IRR 1000ML POUR (IV SOLUTION) ×2 IMPLANT

## 2023-06-08 NOTE — H&P (Signed)
[name] [name] is an 72 y.o. female.   Chief Complaint: right shoulder pain HPI: [name] [name] is a 72 y.o. female who presents reporting right shoulder pain.  Patient has been taking tramadol.  Pain does radiate to her forearm.  She has had a series of injections which gave her temporary relief.  MRI scan of the shoulder is reviewed with the patient and it does show full-thickness partial width tear of the supraspinatus tendon with about 1 and half centimeters of retraction.  Does have some tendinosis also of the long head of the biceps tendon.  Moderate glenohumeral and AC joint arthritis is also present.  Patient describes both pain and weakness related to her right shoulder which has been going on for years..   Past Medical History:  Diagnosis Date   Acid reflux    ALCOHOL ABUSE, HX OF 11/06/2007   ALLERGIC RHINITIS 11/06/2007   Anxiety 04/03/2011   ASTHMA 09/13/2007   Asthma    Chronic pain syndrome 12/15/2016   COLONIC POLYPS, HX OF 02/09/2008    ADENOMATOUS POLYP   Coronary artery disease    Encounter for well adult exam without abnormal findings 04/03/2011   HYPERLIPIDEMIA 11/03/2010   HYPERTENSION 09/13/2007   HYPOTHYROIDISM 11/06/2007   Impaired glucose tolerance 07/30/2013   INSOMNIA, HX OF 09/13/2007   Lumbar degenerative disc disease 12/15/2016   OSTEOPOROSIS 11/06/2007   PAD (peripheral artery disease) (HCC)    PERIMENOPAUSAL STATUS 09/13/2007   RLS (restless legs syndrome)    Stroke [facility])    TIA (transient ischemic attack) 11/04/2011    Past Surgical History:  Procedure Laterality Date   ABDOMINAL AORTOGRAM W/LOWER EXTREMITY N/A 08/27/2020   Procedure: ABDOMINAL AORTOGRAM W/LOWER EXTREMITY;  Surgeon: Elder Negus, MD;  Location: [facility] INVASIVE CV LAB;  Service: Cardiovascular;  Laterality: N/A;   BREAST EXCISIONAL BIOPSY Left    BREAST SURGERY  2008 and 2012   x 2 - benign, left side   carotid artery surgery Left    CESAREAN SECTION     x 3   COLONOSCOPY   multiple   2010   PERIPHERAL VASCULAR BALLOON ANGIOPLASTY  08/27/2020   Procedure: PERIPHERAL VASCULAR BALLOON ANGIOPLASTY;  Surgeon: Elder Negus, MD;  Location: [facility] INVAS[facility] CV LAB;  Service: Cardiovascular;;  Right SFA scoring balloon   TRANSFORAMINAL LUMBAR INTERBODY FUSION (TLIF) WITH PEDICLE SCREW FIXATION 1 LEVEL Left 07/10/2020   Procedure: LEFT-SIDED LUMBAR FOUR-FIVE TRANSFORAMINAL LUMBAR INTERBODY FUSION WITH INSTRUMENTATION AND ALLOGRAFT;  Surgeon: Estill Bamberg, MD;  Location: [facility] OR;  Service: Orthopedics;  Laterality: Left;    Family History  Problem Relation Age of Onset   Hypertension Mother 14   Heart disease Mother    Heart failure Mother 62   Hypertension Brother    Colon cancer Brother 65   Dementia Maternal Grandmother    Breast cancer Cousin    Hyperlipidemia Other    Hypertension Other    Coronary artery disease Other    Social History:  reports that she quit smoking about 44 years ago. Her smoking use included cigarettes. She smoked an average of .25 packs per day. Her smokeless tobacco use includes snuff. She reports that she does not currently use alcohol. She reports that she does not currently use drugs.  Allergies:  Allergies  Allergen Reactions   Atorvastatin Other (See Comments)    Arthralgia and myalgia   Benazepril Swelling   Lisinopril Cough   Fosamax [Alendronate Sodium] Nausea And Vomiting   Penicillins Hives  Did it involve swelling of the face/tongue/throat, SOB, or low BP? No Did it involve sudden or severe rash/hives, skin peeling, or any reaction on the inside of your mouth or nose? Yes Did you need to seek medical attention at a hospital or doctor's office? Yes When did it last happen? Over 10 years ago If all above answers are "NO", may proceed with cephalosporin use.   Trazodone And Nefazodone     Pt is unsure of reaction, possibly made her feel spaced out?    Medications Prior to Admission  Medication Sig Dispense Refill    acitretin (SORIATANE) 25 MG capsule Take 25 mg by mouth daily.     Calcium Carbonate-Vit D-Min (CALCIUM 1200 PO) Take 1,200 mg by mouth daily.     Cholecalciferol (VITAMIN D) 50 MCG (2000 UT) tablet Take 2,000 Units by mouth daily.     citalopram (CELEXA) 20 MG tablet Take 1 tablet (20 mg total) by mouth daily. 90 tablet 3   clopidogrel (PLAVIX) 75 MG tablet TAKE 1 TABLET BY MOUTH EVERY DAY 90 tablet 3   diltiazem (CARDIZEM CD) 180 MG 24 hr capsule TAKE 1 CAPSULE BY MOUTH EVERY DAY 90 capsule 3   ezetimibe (ZETIA) 10 MG tablet TAKE 1 TABLET BY MOUTH DAILY AFTER SUPPER 90 tablet 3   fluticasone-salmeterol (ADVAIR DISKUS) 250-50 MCG/ACT AEPB Inhale 1 puff into the lungs in the morning and at bedtime. (Patient taking differently: Inhale 1 puff into the lungs 2 (two) times daily as needed (asthma).) 60 each 3   levothyroxine (SYNTHROID) 88 MCG tablet TAKE 1 TABLET BY MOUTH EVERY DAY BEFORE BREAKFAST 90 tablet 3   losartan (COZAAR) 25 MG tablet Take 1 tablet (25 mg total) by mouth every evening. 90 tablet 3   methocarbamol (ROBAXIN) 500 MG tablet Take 1 tablet (500 mg total) by mouth every 8 (eight) hours as needed. 30 tablet 2   montelukast (SINGULAIR) 10 MG tablet TAKE 1 TABLET BY MOUTH EVERY DAY 90 tablet 3   Multiple Vitamin (MULTIVITAMIN WITH MINERALS) TABS tablet Take 1 tablet by mouth daily.     Omega-3 Fatty Acids (FISH OIL) 1000 MG CAPS Take 1,000 mg by mouth daily.     pantoprazole (PROTONIX) 40 MG tablet Take 1 tablet (40 mg total) by mouth 2 (two) times daily. (Patient taking differently: Take 40 mg by mouth daily as needed (acid reflux).) 180 tablet 3   rOPINIRole (REQUIP) 0.5 MG tablet Take 1 tablet (0.5 mg total) by mouth at bedtime. 90 tablet 3   rosuvastatin (CRESTOR) 20 MG tablet TAKE 1 TABLET BY MOUTH EVERY DAY 90 tablet 3   traMADol (ULTRAM) 50 MG tablet Take 1 tablet (50 mg total) by mouth at bedtime as needed. 20 tablet 0   meloxicam (MOBIC) 15 MG tablet Take 1 tablet (15 mg  total) by mouth daily. (Patient not taking: Reported on 05/26/2023) 30 tablet 0   zolpidem (AMBIEN) 10 MG tablet TAKE 1 TABLET (10 MG TOTAL) BY MOUTH AT BEDTIME AS NEEDED. FOR SLEEP 90 tablet 0    No results found for this or any previous visit (from the past 48 hour(s)). No results found.  Review of Systems  Musculoskeletal:  Positive for arthralgias.  All other systems reviewed and are negative.   Blood pressure 129/71, pulse 68, temperature 98.9 F (37.2 C), temperature source Oral, resp. rate 16, height 4\' 11"  (1.499 m), weight 61.2 kg, SpO2 99 %. Physical Exam Vitals reviewed.  HENT:     Head: Normocephalic.  Nose: Nose normal.     Mouth/Throat:     Mouth: Mucous membranes are moist.  Eyes:     Pupils: Pupils are equal, round, and reactive to light.  Cardiovascular:     Rate and Rhythm: Normal rate.     Pulses: Normal pulses.  Pulmonary:     Effort: Pulmonary effort is normal.  Abdominal:     General: Abdomen is flat.  Musculoskeletal:     Cervical back: Normal range of motion.  Skin:    General: Skin is warm.     Capillary Refill: Capillary refill takes less than 2 seconds.  Neurological:     General: No focal deficit present.     Mental Status: She is alert.  Psychiatric:        Mood and Affect: Mood normal.     Ortho exam demonstrates slight weakness to supraspinatus and infraspinatus testing on the right compared to the left. Subscap strength is intact. Does have tenderness to bicipital palpation of the bicipital groove but no discrete asymmetric AC joint tenderness. No popping is noted with internal/external rotation at 90 degrees of abduction. Deltoid is functional. Cervical spine range of motion is full. Subscap strength 5+ out of 5.  Assessment/Plan Impression is right shoulder rotator cuff tear with arthritis which does not look that bad on my review of the MRI scan. Discussed with [name] [name] the risk and benefits of surgical versus nonsurgical management.  Surgical management would include arthroscopy with biceps tendon release tenodesis and mini open rotator cuff tear repair. I do not think her AC joint is particularly symptomatic at this time. Patient understands the risk and benefits including not limited to infection shoulder stiffness incomplete pain relief particularly if arthritis is a bigger component than what it appears to be on the MRI scan. The extensive nature of the rehabilitative process is also discussed. All questions answered. She will need cardiac risk stratification from Dr. Jacinto Halim. All questions answered.   Burnard Bunting, MD 06/08/2023, 6:12 AM

## 2023-06-08 NOTE — Anesthesia Procedure Notes (Signed)
Anesthesia Regional Block: Interscalene brachial plexus block   Pre-Anesthetic Checklist: , timeout performed,  Correct Patient, Correct Site, Correct Laterality,  Correct Procedure, Correct Position, site marked,  Risks and benefits discussed,  Surgical consent,  Pre-op evaluation,  At surgeon's request and post-op pain management  Laterality: Right  Prep: chloraprep       Needles:  Injection technique: Single-shot  Needle Type: Echogenic Stimulator Needle     Needle Length: 5cm  Needle Gauge: 22     Additional Needles:   Procedures:, nerve stimulator,,,,,     Nerve Stimulator or Paresthesia:  Response: biceps flexion, 0.45 mA  Additional Responses:   Narrative:  Start time: 06/08/2023 7:05 AM End time: 06/08/2023 7:12 AM Injection made incrementally with aspirations every 5 mL.  Performed by: Personally  Anesthesiologist: Achille Rich, MD  Additional Notes: Functioning IV was confirmed and monitors were applied.  A 50mm 22ga Arrow echogenic stimulator needle was used. Sterile prep and drape,hand hygiene and sterile gloves were used.  Negative aspiration and negative test dose prior to incremental administration of local anesthetic. The patient tolerated the procedure well.  Ultrasound guidance: relevent anatomy identified, needle position confirmed, local anesthetic spread visualized around nerve(s), vascular puncture avoided.  Image printed for medical record.

## 2023-06-08 NOTE — Brief Op Note (Signed)
   06/08/2023  9:42 AM  PATIENT:  [name] [name]  72 y.o. female  PRE-OPERATIVE DIAGNOSIS:  right shoulder rotator cuff tear,biceps tendinitis  POST-OPERATIVE DIAGNOSIS:  same  PROCEDURE:  Procedure(s): RIGHT SHOULDER ARTHROSCOPY, DEBRIDEMENT, MINI OPEN ROTATOR CUFF TEAR REPAIR AND BICEPS TENODESIS  SURGEON:  Surgeon(s): August Saucer, Corrie Mckusick, MD  ASSISTANT: magnant pa  ANESTHESIA:   general  EBL: 20 ml    Total I/O In: 1100 [I.V.:1000; IV Piggyback:100] Out: 25 [Blood:25]  BLOOD ADMINISTERED: none  DRAINS: none   LOCAL MEDICATIONS USED:  none  SPECIMEN:  No Specimen  COUNTS:  YES  TOURNIQUET:  * No tourniquets in log *  DICTATION: .Other Dictation: Dictation Number [id number]  PLAN OF CARE: Discharge to home after PACU  PATIENT DISPOSITION:  PACU - hemodynamically stable

## 2023-06-08 NOTE — Anesthesia Procedure Notes (Signed)
Procedure Name: Intubation Date/Time: 06/08/2023 7:23 AM  Performed by: Carolynne Edouard, RNPre-anesthesia Checklist: Patient identified, Emergency Drugs available, Suction available and Patient being monitored Patient Re-evaluated:Patient Re-evaluated prior to induction Oxygen Delivery Method: Circle system utilized Preoxygenation: Pre-oxygenation with 100% oxygen Induction Type: IV induction Ventilation: Mask ventilation without difficulty and Oral airway inserted - appropriate to patient size Laryngoscope Size: Mac and 3 Grade View: Grade I Tube type: Oral Number of attempts: 1 Airway Equipment and Method: Stylet and Oral airway Placement Confirmation: ETT inserted through vocal cords under direct vision, positive ETCO2 and breath sounds checked- equal and bilateral Secured at: 20 cm Tube secured with: Tape Dental Injury: Teeth and Oropharynx as per pre-operative assessment

## 2023-06-08 NOTE — Transfer of Care (Signed)
Immediate Anesthesia Transfer of Care Note  Patient: [name] [name]  Procedure(s) Performed: RIGHT SHOULDER ARTHROSCOPY, DEBRIDEMENT, MINI OPEN ROTATOR CUFF TEAR REPAIR AND BICEPS TENODESIS (Right)  Patient Location: PACU  Anesthesia Type:GA combined with regional for post-op pain  Level of Consciousness: drowsy and patient cooperative  Airway & Oxygen Therapy: Patient Spontanous Breathing and Patient connected to nasal cannula oxygen  Post-op Assessment: Report given to RN and Post -op Vital signs reviewed and stable  Post vital signs: Reviewed and stable  Last Vitals:  Vitals Value Taken Time  BP 158/88 06/08/23 0942  Temp    Pulse 68 06/08/23 0943  Resp 16 06/08/23 0943  SpO2 100 % 06/08/23 0943  Vitals shown include unvalidated device data.  Last Pain:  Vitals:   06/08/23 0623  TempSrc:   PainSc: 5          Complications: No notable events documented.

## 2023-06-08 NOTE — Telephone Encounter (Signed)
Pt friend called stating she had surgery this morning and was just discharged but was told they will be going home with a CPM machine and she doesn't have one. Pt wants to know what needs to be done. Please advise.

## 2023-06-08 NOTE — Op Note (Unsigned)
NAME: [name] [name], [name] [name] MEDICAL RECORD NO: [id number] ACCOUNT NO: [id number] DATE OF BIRTH: 07/12/1951 FACILITY: [facility] LOCATION: [facility] PHYSICIAN: Graylin Shiver. August Saucer, MD  Operative Report   DATE OF PROCEDURE: 06/08/2023  PREOPERATIVE DIAGNOSES:  Right shoulder rotator cuff tear and biceps tendinitis.  POSTOPERATIVE DIAGNOSES:  Right shoulder rotator cuff tear and biceps tendinitis.  PROCEDURE:  Right shoulder arthroscopy with limited debridement of the superior labrum as well as inflamed capsule followed by mini open rotator cuff tear repair of a 2 x 2 cm supraspinatus rotator cuff tear with 1.5 cm of retraction utilizing a 2 x 2  construct and mini open biceps tenodesis.  SURGEON:  Graylin Shiver. August Saucer, MD  ASSISTANT:  Karenann Cai, PA.  INDICATIONS:  This is a 72 year old patient with right shoulder pain, who presents for operative management after explanation of risks and benefits.  DESCRIPTION OF PROCEDURE:  The patient was brought to the operating room where general anesthetic was induced.  Preoperative antibiotics administered.  Timeout was called.  The patient was placed in the beach chair position with head in neutral position.   Right shoulder, arm and hand prescrubbed with alcohol and Betadine, allowed to air dry, prepped with DuraPrep solution and draped in sterile manner.  Ioban used to seal the operative field.  After calling timeout posterior portal was created 2 cm  medial and inferior to the posterolateral margin of the acromion. Diagnostic arthroscopy was performed.  The patient had generally intact glenohumeral articular surfaces.  Minimal grade 1 chondromalacia on the humeral head about 30% and minimal changes  on the glenoid.  Anterior, inferior and posterior inferior glenohumeral ligaments intact.  Rotator cuff tear was visualized and it appeared to be more of a degenerative type tear.  There was some synovitis within the rotator interval, which was  cauterized using the  Arthrocare wand. The superior labrum, which had significant degeneration was also debrided.  Biceps tendon was released.  Significant tendinitis also present in the biceps tendon.  At this time, after debridement intraarticularly  using the Arthrocare wand, the joint was irrigated and then instruments were removed, and the portals were closed using 3-0 nylon.  Next Ioban used to cover the operative field and a small incision made off the anterolateral margin of the acromion.  Deltoid was split between the anterior middle raphae then marked with #1 Vicryl suture.  Bursectomy performed.  Biceps tendon was then tenodesed into the bicipital groove using knotless Arthrex suture anchors in the proximal and distal aspect of the  groove with appropriate tension and fixation achieved after placing a FiberLoop suture in the biceps tendon.  Next, the attention was directed towards the rotator cuff tear.  Subacromial space was tight.  Acromioplasty performed.  The tendon was then  mobilized using a 0 Vicryl sutures x6 placed in modified Mason-Allen fashion along the leading edge of the supraspinatus tendon.  The landing zone on the greater tuberosity was then prepared with a rongeur.  Good mobilization of the tendon was achieved.   Next, we placed two Arthrex suture tape anchors at the junction of the articular surface and greater tuberosity.  Those 8 suture limbs were passed posterior to anterior using a Scorpion suture passer.  Next, the tendon was reduced to its footprint and  the sutures and the suture tapes were tied x4 and limbs crossed.  Those 4 limbs anterior and 4 limbs posterior were then matched with the three 0 Vicryl sutures posterior and three 0 Vicryl anterior.  A crossmatch  pattern was created, which was then  tacked down the arm in abduction using 2 SwiveLock in good fixation and watertight repair achieved.  The arm was taken through range of motion, found to have a good stability of the repair.  Next,  the incision was thoroughly irrigated.  The deltoid split  was closed using #1 Vicryl suture followed by interrupted inverted 0 Vicryl suture, 2-0 Vicryl suture, and 3-0 Monocryl, Steri-Strips and a Tegaderm was applied.  Medium shoulder immobilizer was placed.  The patient tolerated the procedure well without  immediate complications.  Luke's assistance was required at all times for retraction, opening, closing, mobilization of tissue.  His assistance was a medical necessity.     [email or web address] D: 06/08/2023 9:48:32 am T: 06/08/2023 11:08:00 am  JOB: [id number]/ [id number]

## 2023-06-09 ENCOUNTER — Encounter (HOSPITAL_COMMUNITY): Payer: Self-pay | Admitting: Orthopedic Surgery

## 2023-06-09 DIAGNOSIS — M75121 Complete rotator cuff tear or rupture of right shoulder, not specified as traumatic: Secondary | ICD-10-CM | POA: Diagnosis not present

## 2023-06-09 NOTE — Anesthesia Postprocedure Evaluation (Signed)
Anesthesia Post Note  Patient: [name] [name]  Procedure(s) Performed: RIGHT SHOULDER ARTHROSCOPY, DEBRIDEMENT, MINI OPEN ROTATOR CUFF TEAR REPAIR AND BICEPS TENODESIS (Right)     Patient location during evaluation: PACU Anesthesia Type: General and Regional Level of consciousness: awake and alert Pain management: pain level controlled Vital Signs Assessment: post-procedure vital signs reviewed and stable Respiratory status: spontaneous breathing, nonlabored ventilation, respiratory function stable and patient connected to nasal cannula oxygen Cardiovascular status: blood pressure returned to baseline and stable Postop Assessment: no apparent nausea or vomiting Anesthetic complications: no   No notable events documented.  Last Vitals:  Vitals:   06/08/23 1015 06/08/23 1030  BP: 138/74 126/68  Pulse: 65 62  Resp: 18 20  Temp:  36.4 C  SpO2: 92% 95%    Last Pain:  Vitals:   06/08/23 1030  TempSrc:   PainSc: 1                  Monnie Gudgel S

## 2023-06-13 DIAGNOSIS — M659 Synovitis and tenosynovitis, unspecified: Secondary | ICD-10-CM

## 2023-06-13 DIAGNOSIS — M7521 Bicipital tendinitis, right shoulder: Secondary | ICD-10-CM

## 2023-06-13 DIAGNOSIS — M75121 Complete rotator cuff tear or rupture of right shoulder, not specified as traumatic: Secondary | ICD-10-CM

## 2023-06-13 DIAGNOSIS — M65911 Unspecified synovitis and tenosynovitis, right shoulder: Secondary | ICD-10-CM

## 2023-06-15 ENCOUNTER — Ambulatory Visit: Payer: Medicare PPO | Admitting: Psychology

## 2023-06-16 ENCOUNTER — Ambulatory Visit (INDEPENDENT_AMBULATORY_CARE_PROVIDER_SITE_OTHER): Payer: Medicare PPO | Admitting: Surgical

## 2023-06-16 DIAGNOSIS — M75101 Unspecified rotator cuff tear or rupture of right shoulder, not specified as traumatic: Secondary | ICD-10-CM

## 2023-06-16 MED ORDER — OXYCODONE HCL 5 MG PO TABS: 5.0000 mg | ORAL_TABLET | Freq: Four times a day (QID) | ORAL | 0 refills | Status: DC | PRN

## 2023-06-16 MED ORDER — METHOCARBAMOL 500 MG PO TABS: 500.0000 mg | ORAL_TABLET | Freq: Three times a day (TID) | ORAL | 2 refills | Status: DC | PRN

## 2023-06-16 NOTE — Progress Notes (Signed)
Post-Op Visit Note   Patient: [name] [name]           Date of Birth: 1951-01-26           MRN: [id number] Visit Date: 06/16/2023 PCP: [name], FNP   Assessment & Plan:  Chief Complaint:  Chief Complaint  Patient presents with   Right Shoulder - Pain   Visit Diagnoses:  1. Tear of right supraspinatus tendon     Plan: [name] [name] is a 72 y.o. female who presents s/p right shoulder rotator cuff repair and biceps tenodesis on 06/08/2023.  Patient is doing well and pain is overall controlled.  Up to full degrees on CPM machine.  Denies any chest pain, SOB, fevers, chills. Taking pain medication every 4 hours.    On exam, patient has range of motion 10 degrees X rotation, 70 degrees abduction, 90 degrees forward elevation.  Intact EPL, FPL, finger abduction, finger adduction, pronation/supination, bicep, tricep, deltoid of operative extremity.  Axillary nerve intact with deltoid firing.  Incisions are healing well without evidence of infection or dehiscence.  Sutures removed and replaced with Steri-Strips today.  2+ radial pulse of the operative extremity  Plan is continue with sling.  Continue using CPM machine.  Refill pain medication.  Follow-up in 2 weeks for clinical recheck with Dr. August Saucer.  No lifting with the operative extremity.  No active range of motion of the operative shoulder..   Follow-Up Instructions: No follow-ups on file.   Orders:  No orders of the defined types were placed in this encounter.  No orders of the defined types were placed in this encounter.   Imaging: No results found.  PMFS History: Patient Active Problem List   Diagnosis Date Noted   Complete tear of right rotator cuff 06/13/2023   Biceps tendonitis on right 06/13/2023   Synovitis of right shoulder 06/13/2023   Claudication in peripheral vascular disease (HCC) 08/26/2020   Neurogenic claudication 07/10/2020   Degenerative lumbar spinal stenosis 05/16/2020   Sacroiliac pain  08/10/2019   Anxiety and depression 05/11/2019   Chest pain 05/09/2019   Arthritis of sacroiliac joint of both sides 05/01/2019   Polyarthralgia 12/28/2018   Preventative health care 10/20/2018   RLS (restless legs syndrome) 09/27/2018   Insomnia disorder related to known organic factor 06/27/2018   PLMD (periodic limb movement disorder) 06/27/2018   Hot flashes due to menopause 04/25/2018   Palpitations 04/25/2018   Diaphoresis 04/25/2018   Long term current use of antithrombotics/antiplatelets 04/19/2018   Insomnia 04/19/2018   Intractable episodic headache 02/28/2018   Numbness and tingling of right arm 02/28/2018   Toe pain, right 10/19/2017   Rotator cuff arthropathy of left shoulder 06/02/2017   Trigger thumb of left hand 06/02/2017   Lipoma 05/06/2017   Pain of left thumb 05/06/2017   Chronic pain syndrome 12/15/2016   Lumbar degenerative disc disease 12/15/2016   Left leg pain 01/28/2016   Left shoulder pain 04/09/2015   Impaired glucose tolerance 07/30/2013   Abnormal breath sounds 07/30/2013   Asymptomatic bilateral carotid artery stenosis 05/05/2012   Anxiety 04/03/2011   CVA (cerebral infarction) 03/15/2011   HLD (hyperlipidemia) 11/03/2010   Hx of adenomatous colonic polyps 02/09/2008   Hypothyroidism 11/06/2007   ALLERGIC RHINITIS 11/06/2007   OSTEOPOROSIS 11/06/2007   ALCOHOL ABUSE, HX OF 11/06/2007   Essential hypertension 09/13/2007   Asthma 09/13/2007   PERIMENOPAUSAL STATUS 09/13/2007   Chronic insomnia 09/13/2007   Past Medical History:  Diagnosis Date  Acid reflux    ALCOHOL ABUSE, HX OF 11/06/2007   ALLERGIC RHINITIS 11/06/2007   Anxiety 04/03/2011   ASTHMA 09/13/2007   Asthma    Chronic pain syndrome 12/15/2016   COLONIC POLYPS, HX OF 02/09/2008    ADENOMATOUS POLYP   Coronary artery disease    Encounter for well adult exam without abnormal findings 04/03/2011   HYPERLIPIDEMIA 11/03/2010   HYPERTENSION 09/13/2007   HYPOTHYROIDISM  11/06/2007   Impaired glucose tolerance 07/30/2013   INSOMNIA, HX OF 09/13/2007   Lumbar degenerative disc disease 12/15/2016   OSTEOPOROSIS 11/06/2007   PAD (peripheral artery disease) (HCC)    PERIMENOPAUSAL STATUS 09/13/2007   RLS (restless legs syndrome)    Stroke (HCC)    TIA (transient ischemic attack) 11/04/2011    Family History  Problem Relation Age of Onset   Hypertension Mother 86   Heart disease Mother    Heart failure Mother 45   Hypertension Brother    Colon cancer Brother 20   Dementia Maternal Grandmother    Breast cancer Cousin    Hyperlipidemia Other    Hypertension Other    Coronary artery disease Other     Past Surgical History:  Procedure Laterality Date   ABDOMINAL AORTOGRAM W/LOWER EXTREMITY N/A 08/27/2020   Procedure: ABDOMINAL AORTOGRAM W/LOWER EXTREMITY;  Surgeon: Elder Negus, MD;  Location: [facility];  Service: Cardiovascular;  Laterality: N/A;   BREAST EXCISIONAL BIOPSY Left    BREAST SURGERY  2008 and 2012   x 2 - benign, left side   carotid artery surgery Left    CESAREAN SECTION     x 3   COLONOSCOPY  multiple   2010   PERIPHERAL VASCULAR BALLOON ANGIOPLASTY  08/27/2020   Procedure: PERIPHERAL VASCULAR BALLOON ANGIOPLASTY;  Surgeon: Elder Negus, MD;  Location: [facility];  Service: Cardiovascular;;  Right SFA scoring balloon   SHOULDER ARTHROSCOPY WITH SUBACROMIAL DECOMPRESSION, ROTATOR CUFF REPAIR AND BICEP TENDON REPAIR Right 06/08/2023   Procedure: RIGHT SHOULDER ARTHROSCOPY, DEBRIDEMENT, MINI OPEN ROTATOR CUFF TEAR REPAIR AND BICEPS TENODESIS;  Surgeon: Cammy Copa, MD;  Location: [facility];  Service: Orthopedics;  Laterality: Right;   TRANSFORAMINAL LUMBAR INTERBODY FUSION (TLIF) WITH PEDICLE SCREW FIXATION 1 LEVEL Left 07/10/2020   Procedure: LEFT-SIDED LUMBAR FOUR-FIVE TRANSFORAMINAL LUMBAR INTERBODY FUSION WITH INSTRUMENTATION AND ALLOGRAFT;  Surgeon: Estill Bamberg, MD;  Location: [facility];  Service:  Orthopedics;  Laterality: Left;   Social History   Occupational History   Occupation: Banker: [employer]  Tobacco Use   Smoking status: Former    Packs/day: .25    Types: Cigarettes    Quit date: 05/31/1979    Years since quitting: 44.0   Smokeless tobacco: Current    Types: Snuff   Tobacco comments:    social smoker  Vaping Use   Vaping Use: Never used  Substance and Sexual Activity   Alcohol use: Not Currently   Drug use: Not Currently   Sexual activity: Not on file

## 2023-06-18 ENCOUNTER — Encounter: Payer: Self-pay | Admitting: Surgical

## 2023-06-29 ENCOUNTER — Ambulatory Visit: Payer: Medicare PPO | Admitting: Psychology

## 2023-07-01 ENCOUNTER — Telehealth: Payer: Self-pay | Admitting: Orthopedic Surgery

## 2023-07-01 NOTE — Telephone Encounter (Signed)
Patient called. Would like refills on robaxin and roxicodone called in for her. Her cb [phone]

## 2023-07-02 ENCOUNTER — Other Ambulatory Visit: Payer: Self-pay | Admitting: Surgical

## 2023-07-02 MED ORDER — METHOCARBAMOL 500 MG PO TABS: 500.0000 mg | ORAL_TABLET | Freq: Three times a day (TID) | ORAL | 2 refills | Status: DC | PRN

## 2023-07-02 MED ORDER — OXYCODONE HCL 5 MG PO TABS: 5.0000 mg | ORAL_TABLET | Freq: Four times a day (QID) | ORAL | 0 refills | Status: DC | PRN

## 2023-07-02 NOTE — Telephone Encounter (Signed)
I called patient and advised. 

## 2023-07-02 NOTE — Telephone Encounter (Signed)
Sent in

## 2023-07-09 ENCOUNTER — Ambulatory Visit (INDEPENDENT_AMBULATORY_CARE_PROVIDER_SITE_OTHER): Payer: Medicare PPO | Admitting: Orthopedic Surgery

## 2023-07-09 ENCOUNTER — Telehealth: Payer: Self-pay | Admitting: Orthopedic Surgery

## 2023-07-09 ENCOUNTER — Encounter: Payer: Self-pay | Admitting: Orthopedic Surgery

## 2023-07-09 DIAGNOSIS — M75101 Unspecified rotator cuff tear or rupture of right shoulder, not specified as traumatic: Secondary | ICD-10-CM

## 2023-07-09 MED ORDER — OXYCODONE HCL 5 MG PO TABS: 5.0000 mg | ORAL_TABLET | Freq: Four times a day (QID) | ORAL | 0 refills | Status: DC | PRN

## 2023-07-09 NOTE — Progress Notes (Signed)
Post-Op Visit Note   Patient: [name] [name]           Date of Birth: [birth year]           MRN: [id number] Visit Date: 07/09/2023 PCP: [name], FNP   Assessment & Plan:  Chief Complaint:  Chief Complaint  Patient presents with   Right Shoulder - Routine Post Op    right shoulder rotator cuff repair and biceps tenodesis on 06/08/2023   Visit Diagnoses:  1. Tear of right supraspinatus tendon     Plan: [name] [name] is a patient is about 4 weeks out right shoulder arthroscopy with rotator cuff tear repair using a 2 x 2 construct.  Biceps tenodesis also performed.  Patient did use the brace but she feels like is not helping her.  Having a lot of pain.  Taking oxycodone and muscle relaxer.  On examination she is somewhat predictably stiff.  Rotator cuff feels good with passive range of motion.  She has about 20 degrees of external rotation 70 degrees of abduction and about 80 of forward flexion.  Plan at this time is to discontinue sling.  No lifting with the right arm.  She needs therapy about 2-3 times a week for the next 4 weeks to work primarily on range of motion but it is okay to start strengthening only at the 6-week postop mark which for her would be in 2 weeks.  Come back in 4 weeks for clinical recheck.  Encouraged her to get an overhead pulley so she can work on stretching it out a little bit more for overhead flexion.  Follow-Up Instructions: Return in about 4 weeks (around 08/06/2023).   Orders:  Orders Placed This Encounter  Procedures   Ambulatory referral to Physical Therapy   Meds ordered this encounter  Medications   oxyCODONE (ROXICODONE) 5 MG immediate release tablet    Sig: Take 1 tablet (5 mg total) by mouth every 6 (six) hours as needed for severe pain.    Dispense:  30 tablet    Refill:  0    Imaging: No results found.  PMFS History: Patient Active Problem List   Diagnosis Date Noted   Complete tear of right rotator cuff 06/13/2023   Biceps  tendonitis on right 06/13/2023   Synovitis of right shoulder 06/13/2023   Claudication in peripheral vascular disease (HCC) 08/26/2020   Neurogenic claudication 07/10/2020   Degenerative lumbar spinal stenosis 05/16/2020   Sacroiliac pain 08/10/2019   Anxiety and depression 05/11/2019   Chest pain 05/09/2019   Arthritis of sacroiliac joint of both sides 05/01/2019   Polyarthralgia 12/28/2018   Preventative health care 10/20/2018   RLS (restless legs syndrome) 09/27/2018   Insomnia disorder related to known organic factor 06/27/2018   PLMD (periodic limb movement disorder) 06/27/2018   Hot flashes due to menopause 04/25/2018   Palpitations 04/25/2018   Diaphoresis 04/25/2018   Long term current use of antithrombotics/antiplatelets 04/19/2018   Insomnia 04/19/2018   Intractable episodic headache 02/28/2018   Numbness and tingling of right arm 02/28/2018   Toe pain, right 10/19/2017   Rotator cuff arthropathy of left shoulder 06/02/2017   Trigger thumb of left hand 06/02/2017   Lipoma 05/06/2017   Pain of left thumb 05/06/2017   Chronic pain syndrome 12/15/2016   Lumbar degenerative disc disease 12/15/2016   Left leg pain 01/28/2016   Left shoulder pain 04/09/2015   Impaired glucose tolerance 07/30/2013   Abnormal breath sounds 07/30/2013   Asymptomatic bilateral carotid  artery stenosis 05/05/2012   Anxiety 04/03/2011   CVA (cerebral infarction) 03/15/2011   HLD (hyperlipidemia) 11/03/2010   Hx of adenomatous colonic polyps 02/09/2008   Hypothyroidism 11/06/2007   ALLERGIC RHINITIS 11/06/2007   OSTEOPOROSIS 11/06/2007   ALCOHOL ABUSE, HX OF 11/06/2007   Essential hypertension 09/13/2007   Asthma 09/13/2007   PERIMENOPAUSAL STATUS 09/13/2007   Chronic insomnia 09/13/2007   Past Medical History:  Diagnosis Date   Acid reflux    ALCOHOL ABUSE, HX OF 11/06/2007   ALLERGIC RHINITIS 11/06/2007   Anxiety 04/03/2011   ASTHMA 09/13/2007   Asthma    Chronic pain syndrome  12/15/2016   COLONIC POLYPS, HX OF 02/09/2008    ADENOMATOUS POLYP   Coronary artery disease    Encounter for well adult exam without abnormal findings 04/03/2011   HYPERLIPIDEMIA 11/03/2010   HYPERTENSION 09/13/2007   HYPOTHYROIDISM 11/06/2007   Impaired glucose tolerance 07/30/2013   INSOMNIA, HX OF 09/13/2007   Lumbar degenerative disc disease 12/15/2016   OSTEOPOROSIS 11/06/2007   PAD (peripheral artery disease) (HCC)    PERIMENOPAUSAL STATUS 09/13/2007   RLS (restless legs syndrome)    Stroke (HCC)    TIA (transient ischemic attack) 11/04/2011    Family History  Problem Relation Age of Onset   Hypertension Mother 73   Heart disease Mother    Heart failure Mother 34   Hypertension Brother    Colon cancer Brother 75   Dementia Maternal Grandmother    Breast cancer Cousin    Hyperlipidemia Other    Hypertension Other    Coronary artery disease Other     Past Surgical History:  Procedure Laterality Date   ABDOMINAL AORTOGRAM W/LOWER EXTREMITY N/A 08/27/2020   Procedure: ABDOMINAL AORTOGRAM W/LOWER EXTREMITY;  Surgeon: Elder Negus, MD;  Location: [facility];  Service: Cardiovascular;  Laterality: N/A;   BREAST EXCISIONAL BIOPSY Left    BREAST SURGERY  2008 and 2012   x 2 - benign, left side   carotid artery surgery Left    CESAREAN SECTION     x 3   COLONOSCOPY  multiple   2010   PERIPHERAL VASCULAR BALLOON ANGIOPLASTY  08/27/2020   Procedure: PERIPHERAL VASCULAR BALLOON ANGIOPLASTY;  Surgeon: Elder Negus, MD;  Location: [facility];  Service: Cardiovascular;;  Right SFA scoring balloon   SHOULDER ARTHROSCOPY WITH SUBACROMIAL DECOMPRESSION, ROTATOR CUFF REPAIR AND BICEP TENDON REPAIR Right 06/08/2023   Procedure: RIGHT SHOULDER ARTHROSCOPY, DEBRIDEMENT, MINI OPEN ROTATOR CUFF TEAR REPAIR AND BICEPS TENODESIS;  Surgeon: Cammy Copa, MD;  Location: [facility];  Service: Orthopedics;  Laterality: Right;   TRANSFORAMINAL LUMBAR INTERBODY  FUSION (TLIF) WITH PEDICLE SCREW FIXATION 1 LEVEL Left 07/10/2020   Procedure: LEFT-SIDED LUMBAR FOUR-FIVE TRANSFORAMINAL LUMBAR INTERBODY FUSION WITH INSTRUMENTATION AND ALLOGRAFT;  Surgeon: Estill Bamberg, MD;  Location: [facility];  Service: Orthopedics;  Laterality: Left;   Social History   Occupational History   Occupation: Banker: [employer]  Tobacco Use   Smoking status: Former    Current packs/day: 0.00    Types: Cigarettes    Quit date: 05/31/1979    Years since quitting: 44.1   Smokeless tobacco: Current    Types: Snuff   Tobacco comments:    social smoker  Vaping Use   Vaping status: Never Used  Substance and Sexual Activity   Alcohol use: Not Currently   Drug use: Not Currently   Sexual activity: Not on file

## 2023-07-09 NOTE — Telephone Encounter (Signed)
Patient called asked if she can drive and can she go to her house now?   Patient asked what company will call her to set up (PT)?  The number to contact patient is [phone]

## 2023-07-13 ENCOUNTER — Ambulatory Visit: Payer: Medicare PPO | Admitting: Family

## 2023-07-13 ENCOUNTER — Ambulatory Visit: Payer: Medicare PPO | Admitting: Psychology

## 2023-07-13 NOTE — Therapy (Unsigned)
OUTPATIENT PHYSICAL THERAPY SHOULDER EVALUATION   Patient Name: [name] [name] MRN: [id number] DOB:February 18, 1951, 72 y.o., female Today's Date: 07/14/2023  END OF SESSION:  PT End of Session - 07/14/23 1309     Visit Number 1    Number of Visits 16    Date for PT Re-Evaluation 09/13/23    Authorization Type THN    PT Start Time 1130    PT Stop Time 1215    PT Time Calculation (min) 45 min    Activity Tolerance Patient tolerated treatment well    Behavior During Therapy [facility] for tasks assessed/performed             Past Medical History:  Diagnosis Date   Acid reflux    ALCOHOL ABUSE, HX OF 11/06/2007   ALLERGIC RHINITIS 11/06/2007   Anxiety 04/03/2011   ASTHMA 09/13/2007   Asthma    Chronic pain syndrome 12/15/2016   COLONIC POLYPS, HX OF 02/09/2008    ADENOMATOUS POLYP   Coronary artery disease    Encounter for well adult exam without abnormal findings 04/03/2011   HYPERLIPIDEMIA 11/03/2010   HYPERTENSION 09/13/2007   HYPOTHYROIDISM 11/06/2007   Impaired glucose tolerance 07/30/2013   INSOMNIA, HX OF 09/13/2007   Lumbar degenerative disc disease 12/15/2016   OSTEOPOROSIS 11/06/2007   PAD (peripheral artery disease) (HCC)    PERIMENOPAUSAL STATUS 09/13/2007   RLS (restless legs syndrome)    Stroke (HCC)    TIA (transient ischemic attack) 11/04/2011   Past Surgical History:  Procedure Laterality Date   ABDOMINAL AORTOGRAM W/LOWER EXTREMITY N/A 08/27/2020   Procedure: ABDOMINAL AORTOGRAM W/LOWER EXTREMITY;  Surgeon: Elder Negus, MD;  Location: [facility];  Service: Cardiovascular;  Laterality: N/A;   BREAST EXCISIONAL BIOPSY Left    BREAST SURGERY  2008 and 2012   x 2 - benign, left side   carotid artery surgery Left    CESAREAN SECTION     x 3   COLONOSCOPY  multiple   2010   PERIPHERAL VASCULAR BALLOON ANGIOPLASTY  08/27/2020   Procedure: PERIPHERAL VASCULAR BALLOON ANGIOPLASTY;  Surgeon: Elder Negus, MD;  Location: [facility];  Service: Cardiovascular;;  Right SFA scoring balloon   SHOULDER ARTHROSCOPY WITH SUBACROMIAL DECOMPRESSION, ROTATOR CUFF REPAIR AND BICEP TENDON REPAIR Right 06/08/2023   Procedure: RIGHT SHOULDER ARTHROSCOPY, DEBRIDEMENT, MINI OPEN ROTATOR CUFF TEAR REPAIR AND BICEPS TENODESIS;  Surgeon: Cammy Copa, MD;  Location: [facility] OR;  Service: Orthopedics;  Laterality: Right;   TRANSFORAMINAL LUMBAR INTERBODY FUSION (TLIF) WITH PEDICLE SCREW FIXATION 1 LEVEL Left 07/10/2020   Procedure: LEFT-SIDED LUMBAR FOUR-FIVE TRANSFORAMINAL LUMBAR INTERBODY FUSION WITH INSTRUMENTATION AND ALLOGRAFT;  Surgeon: Estill Bamberg, MD;  Location: [facility];  Service: Orthopedics;  Laterality: Left;   Patient Active Problem List   Diagnosis Date Noted   Complete tear of right rotator cuff 06/13/2023   Biceps tendonitis on right 06/13/2023   Synovitis of right shoulder 06/13/2023   Claudication in peripheral vascular disease (HCC) 08/26/2020   Neurogenic claudication 07/10/2020   Degenerative lumbar spinal stenosis 05/16/2020   Sacroiliac pain 08/10/2019   Anxiety and depression 05/11/2019   Chest pain 05/09/2019   Arthritis of sacroiliac joint of both sides 05/01/2019   Polyarthralgia 12/28/2018   Preventative health care 10/20/2018   RLS (restless legs syndrome) 09/27/2018   Insomnia disorder related to known organic factor 06/27/2018   PLMD (periodic limb movement disorder) 06/27/2018   Hot flashes due to menopause 04/25/2018   Palpitations 04/25/2018  Diaphoresis 04/25/2018   Long term current use of antithrombotics/antiplatelets 04/19/2018   Insomnia 04/19/2018   Intractable episodic headache 02/28/2018   Numbness and tingling of right arm 02/28/2018   Toe pain, right 10/19/2017   Rotator cuff arthropathy of left shoulder 06/02/2017   Trigger thumb of left hand 06/02/2017   Lipoma 05/06/2017   Pain of left thumb 05/06/2017   Chronic pain syndrome 12/15/2016   Lumbar degenerative disc disease  12/15/2016   Left leg pain 01/28/2016   Left shoulder pain 04/09/2015   Impaired glucose tolerance 07/30/2013   Abnormal breath sounds 07/30/2013   Asymptomatic bilateral carotid artery stenosis 05/05/2012   Anxiety 04/03/2011   CVA (cerebral infarction) 03/15/2011   HLD (hyperlipidemia) 11/03/2010   Hx of adenomatous colonic polyps 02/09/2008   Hypothyroidism 11/06/2007   ALLERGIC RHINITIS 11/06/2007   OSTEOPOROSIS 11/06/2007   ALCOHOL ABUSE, HX OF 11/06/2007   Essential hypertension 09/13/2007   Asthma 09/13/2007   PERIMENOPAUSAL STATUS 09/13/2007   Chronic insomnia 09/13/2007    PCP: [name], FNP   REFERRING PROVIDER: Cammy Copa, MD  REFERRING DIAG: M75.101 (ICD-10-CM) - Tear of right supraspinatus tendon  THERAPY DIAG:  S/P left rotator cuff repair  Muscle weakness (generalized)  Acute pain of right shoulder  Rationale for Evaluation and Treatment: Rehabilitation  ONSET DATE: 06/08/23 surgery  SUBJECTIVE:                                                                                                                                                                                      SUBJECTIVE STATEMENT: Reports R shoulder pain following R RCR 10/10 at worst 5/10 at best.  Has been able to DC sling per MD but is restricted in AROM and strengthening until 6 week post op.  Does not like pain meds as they constipate her. Hand dominance: Right  PERTINENT HISTORY: Plan: [name] is a patient is about 4 weeks out right shoulder arthroscopy with rotator cuff tear repair using a 2 x 2 construct.  Biceps tenodesis also performed.  Patient did use the brace but she feels like is not helping her.  Having a lot of pain.  Taking oxycodone and muscle relaxer.  On examination she is somewhat predictably stiff.  Rotator cuff feels good with passive range of motion.  She has about 20 degrees of external rotation 70 degrees of abduction and about 80 of forward  flexion.  Plan at this time is to discontinue sling.  No lifting with the right arm.  She needs therapy about 2-3 times a week for the next 4 weeks to work primarily on range of motion but it is okay to  start strengthening only at the 6-week postop mark which for her would be in 2 weeks.  Come back in 4 weeks for clinical recheck.  Encouraged her to get an overhead pulley so she can work on stretching it out a little bit more for overhead flexion.  PAIN:  Are you having pain? Yes: NPRS scale: 10/10 Pain location: R shoulder Pain description: ache  Aggravating factors: AROM Relieving factors: meds and rests  PRECAUTIONS: Other: post-op RCR R 06/08/23  RED FLAGS: None   WEIGHT BEARING RESTRICTIONS: No  FALLS:  Has patient fallen in last 6 months? No   OCCUPATION: retired  PLOF: Independent  PATIENT GOALS:To regin the use of my shoulder  NEXT MD VISIT: 08/04/23  OBJECTIVE:   DIAGNOSTIC FINDINGS:  none  PATIENT SURVEYS:  FOTO 28(55 predicted)  POSTURE: Elevated R shoulder  UPPER EXTREMITY ROM:   PROM Right eval Left eval  Shoulder flexion    Shoulder extension    Shoulder abduction 85d   Shoulder adduction    Shoulder internal rotation 65d   Shoulder external rotation 32d   Elbow flexion WNL   Elbow extension WNL   Wrist flexion    Wrist extension    Wrist ulnar deviation    Wrist radial deviation    Wrist pronation    Wrist supination    (Blank rows = not tested)  UPPER EXTREMITY MMT: Deferred due to post-op status  MMT Right eval Left eval  Shoulder flexion    Shoulder extension    Shoulder abduction    Shoulder adduction    Shoulder internal rotation    Shoulder external rotation    Middle trapezius    Lower trapezius    Elbow flexion    Elbow extension    Wrist flexion    Wrist extension    Wrist ulnar deviation    Wrist radial deviation    Wrist pronation    Wrist supination    Grip strength (lbs)    (Blank rows = not  tested)  SHOULDER SPECIAL TESTS: deferred  JOINT MOBILITY TESTING:  deferred  PALPATION:  deferred   TODAY'S TREATMENT:                                                                                                                                         DATE: 07/14/23 Eval and HEP    PATIENT EDUCATION: Education details: Discussed eval findings, rehab rationale and POC and patient is in agreement  Person educated: Patient Education method: Explanation Education comprehension: verbalized understanding and needs further education  HOME EXERCISE PROGRAM: Access Code: [email or web address] URL: [email or web address] Date: 07/14/2023 Prepared by: Gustavus Bryant  Exercises - Seated Shoulder Shrug Circles AROM Backward  - 3 x daily - 5 x weekly - 1 sets - 10 reps - Seated Scapular Retraction  - 3 x daily - 5 x weekly - 1 sets - 10 reps -  Seated Elbow Flexion and Extension AROM  - 3 x daily - 5 x weekly - 1 sets - 10 reps  ASSESSMENT:  CLINICAL IMPRESSION: Patient is a 72 y.o. female who was seen today for physical therapy evaluation and treatment for post op R RCR. Patient presents with high pain levels, guarding through R shoulder girdle and decreased PROM.  R wrist and elbow ROM WNL.  Patient is a good candidate for OPPT to help regain, mobility, strength and function and manage pain levels.  OBJECTIVE IMPAIRMENTS: decreased activity tolerance, decreased knowledge of condition, decreased knowledge of use of DME, decreased mobility, decreased ROM, decreased strength, increased fascial restrictions, impaired perceived functional ability, increased muscle spasms, impaired UE functional use, postural dysfunction, and pain.   ACTIVITY LIMITATIONS: carrying, lifting, sleeping, bed mobility, bathing, toileting, dressing, reach over head, and hygiene/grooming  PARTICIPATION LIMITATIONS: meal prep, cleaning, laundry, medication management, driving, and community activity  PERSONAL  FACTORS: Age, Fitness, Past/current experiences, and Time since onset of injury/illness/exacerbation are also affecting patient's functional outcome.   REHAB POTENTIAL: Good  CLINICAL DECISION MAKING: Evolving/moderate complexity  EVALUATION COMPLEXITY: Low   GOALS: Goals reviewed with patient? No  SHORT TERM GOALS: Target date: 08/11/2023    Patient to demonstrate independence in HEP  Baseline: [id number] Goal status: INITIAL  2.  Decrease worst pain to 6/10 Baseline: 10/10 Goal status: INITIAL  3.  Increase PROM R shoulder to Baseline:  PROM Right eval Left eval  Shoulder flexion    Shoulder extension    Shoulder abduction 85d   Shoulder adduction    Shoulder internal rotation 65d   Shoulder external rotation 32d   Elbow flexion WNL   Elbow extension WNL    Goal status: INITIAL   LONG TERM GOALS: Target date: 09/08/2023    Increase FOTO score to 55 Baseline: 28 Goal status: INITIAL  2.  Increase AROM R shoulder to 160d flexion and abduction Baseline: TBD at appropriate post-op interval Goal status: INITIAL  3.  Decrease worst pain to 4/10 Baseline: 10/10 Goal status: INITIAL  4.  Increase R shoulder strength to 4-/5 throughout Baseline: TBD at appropriate post-op interval Goal status: INITIAL    PLAN:  PT FREQUENCY: 1-2x/week  PT DURATION: 8 weeks  PLANNED INTERVENTIONS: Therapeutic exercises, Therapeutic activity, Neuromuscular re-education, Balance training, Gait training, Patient/Family education, Self Care, Joint mobilization, Dry Needling, Electrical stimulation, Cryotherapy, Moist heat, Manual therapy, and Re-evaluation  PLAN FOR NEXT SESSION: HEP review and update, manual techniques as appropriate, aerobic tasks, ROM and flexibility activities, strengthening and PREs, TPDN, gait and balance training as needed     Hildred Laser, PT 07/14/2023, 2:48 PM

## 2023-07-14 ENCOUNTER — Ambulatory Visit: Payer: Medicare PPO | Attending: Orthopedic Surgery

## 2023-07-14 ENCOUNTER — Other Ambulatory Visit: Payer: Self-pay

## 2023-07-14 DIAGNOSIS — M6281 Muscle weakness (generalized): Secondary | ICD-10-CM | POA: Insufficient documentation

## 2023-07-14 DIAGNOSIS — Z9889 Other specified postprocedural states: Secondary | ICD-10-CM | POA: Diagnosis not present

## 2023-07-14 DIAGNOSIS — M25511 Pain in right shoulder: Secondary | ICD-10-CM | POA: Diagnosis not present

## 2023-07-14 DIAGNOSIS — M75101 Unspecified rotator cuff tear or rupture of right shoulder, not specified as traumatic: Secondary | ICD-10-CM | POA: Insufficient documentation

## 2023-07-14 NOTE — Addendum Note (Signed)
Addended by: Hildred Laser on: 07/14/2023 02:50 PM   Modules accepted: Orders

## 2023-07-16 ENCOUNTER — Encounter (HOSPITAL_BASED_OUTPATIENT_CLINIC_OR_DEPARTMENT_OTHER): Payer: Self-pay

## 2023-07-16 NOTE — Telephone Encounter (Signed)
Lvm advising pt that I am sending her a mychart message about this

## 2023-07-16 NOTE — Telephone Encounter (Signed)
Driving is really up to her based on if she feels functional enough for it.  I would definitely say if she has difficulty getting her arm up to shoulder level then driving may need to wait.  If she starts driving, I would recommend starting with local roads and avoiding highways/very busy roads.

## 2023-07-18 NOTE — Therapy (Unsigned)
OUTPATIENT PHYSICAL THERAPY SHOULDER EVALUATION   Patient Name: [name] [name] MRN: [id number] DOB:07-30-1951, 72 y.o., female Today's Date: 07/19/2023  END OF SESSION:  PT End of Session - 07/19/23 1528     Visit Number 2    Number of Visits 16    Date for PT Re-Evaluation 09/13/23    Authorization Type THN    PT Start Time 1530    PT Stop Time 1610    PT Time Calculation (min) 40 min    Activity Tolerance Patient tolerated treatment well    Behavior During Therapy WFL for tasks assessed/performed              Past Medical History:  Diagnosis Date   Acid reflux    ALCOHOL ABUSE, HX OF 11/06/2007   ALLERGIC RHINITIS 11/06/2007   Anxiety 04/03/2011   ASTHMA 09/13/2007   Asthma    Chronic pain syndrome 12/15/2016   COLONIC POLYPS, HX OF 02/09/2008    ADENOMATOUS POLYP   Coronary artery disease    Encounter for well adult exam without abnormal findings 04/03/2011   HYPERLIPIDEMIA 11/03/2010   HYPERTENSION 09/13/2007   HYPOTHYROIDISM 11/06/2007   Impaired glucose tolerance 07/30/2013   INSOMNIA, HX OF 09/13/2007   Lumbar degenerative disc disease 12/15/2016   OSTEOPOROSIS 11/06/2007   PAD (peripheral artery disease) (HCC)    PERIMENOPAUSAL STATUS 09/13/2007   RLS (restless legs syndrome)    Stroke (HCC)    TIA (transient ischemic attack) 11/04/2011   Past Surgical History:  Procedure Laterality Date   ABDOMINAL AORTOGRAM W/LOWER EXTREMITY N/A 08/27/2020   Procedure: ABDOMINAL AORTOGRAM W/LOWER EXTREMITY;  Surgeon: Elder Negus, MD;  Location: [facility];  Service: Cardiovascular;  Laterality: N/A;   BREAST EXCISIONAL BIOPSY Left    BREAST SURGERY  2008 and 2012   x 2 - benign, left side   carotid artery surgery Left    CESAREAN SECTION     x 3   COLONOSCOPY  multiple   2010   PERIPHERAL VASCULAR BALLOON ANGIOPLASTY  08/27/2020   Procedure: PERIPHERAL VASCULAR BALLOON ANGIOPLASTY;  Surgeon: Elder Negus, MD;  Location: [facility];  Service: Cardiovascular;;  Right SFA scoring balloon   SHOULDER ARTHROSCOPY WITH SUBACROMIAL DECOMPRESSION, ROTATOR CUFF REPAIR AND BICEP TENDON REPAIR Right 06/08/2023   Procedure: RIGHT SHOULDER ARTHROSCOPY, DEBRIDEMENT, MINI OPEN ROTATOR CUFF TEAR REPAIR AND BICEPS TENODESIS;  Surgeon: Cammy Copa, MD;  Location: [facility];  Service: Orthopedics;  Laterality: Right;   TRANSFORAMINAL LUMBAR INTERBODY FUSION (TLIF) WITH PEDICLE SCREW FIXATION 1 LEVEL Left 07/10/2020   Procedure: LEFT-SIDED LUMBAR FOUR-FIVE TRANSFORAMINAL LUMBAR INTERBODY FUSION WITH INSTRUMENTATION AND ALLOGRAFT;  Surgeon: Estill Bamberg, MD;  Location: [facility];  Service: Orthopedics;  Laterality: Left;   Patient Active Problem List   Diagnosis Date Noted   Complete tear of right rotator cuff 06/13/2023   Biceps tendonitis on right 06/13/2023   Synovitis of right shoulder 06/13/2023   Claudication in peripheral vascular disease (HCC) 08/26/2020   Neurogenic claudication 07/10/2020   Degenerative lumbar spinal stenosis 05/16/2020   Sacroiliac pain 08/10/2019   Anxiety and depression 05/11/2019   Chest pain 05/09/2019   Arthritis of sacroiliac joint of both sides 05/01/2019   Polyarthralgia 12/28/2018   Preventative health care 10/20/2018   RLS (restless legs syndrome) 09/27/2018   Insomnia disorder related to known organic factor 06/27/2018   PLMD (periodic limb movement disorder) 06/27/2018   Hot flashes due to menopause 04/25/2018   Palpitations 04/25/2018  Diaphoresis 04/25/2018   Long term current use of antithrombotics/antiplatelets 04/19/2018   Insomnia 04/19/2018   Intractable episodic headache 02/28/2018   Numbness and tingling of right arm 02/28/2018   Toe pain, right 10/19/2017   Rotator cuff arthropathy of left shoulder 06/02/2017   Trigger thumb of left hand 06/02/2017   Lipoma 05/06/2017   Pain of left thumb 05/06/2017   Chronic pain syndrome 12/15/2016   Lumbar degenerative disc disease  12/15/2016   Left leg pain 01/28/2016   Left shoulder pain 04/09/2015   Impaired glucose tolerance 07/30/2013   Abnormal breath sounds 07/30/2013   Asymptomatic bilateral carotid artery stenosis 05/05/2012   Anxiety 04/03/2011   CVA (cerebral infarction) 03/15/2011   HLD (hyperlipidemia) 11/03/2010   Hx of adenomatous colonic polyps 02/09/2008   Hypothyroidism 11/06/2007   ALLERGIC RHINITIS 11/06/2007   OSTEOPOROSIS 11/06/2007   ALCOHOL ABUSE, HX OF 11/06/2007   Essential hypertension 09/13/2007   Asthma 09/13/2007   PERIMENOPAUSAL STATUS 09/13/2007   Chronic insomnia 09/13/2007    PCP: [name], FNP   REFERRING PROVIDER: Cammy Copa, MD  REFERRING DIAG: M75.101 (ICD-10-CM) - Tear of right supraspinatus tendon  THERAPY DIAG:  Muscle weakness (generalized)  Rotator cuff arthropathy of right shoulder  Rationale for Evaluation and Treatment: Rehabilitation  ONSET DATE: 06/08/23 surgery  SUBJECTIVE:                                                                                                                                                                                      SUBJECTIVE STATEMENT: Has been compliant with HEP.  Hand dominance: Right  PERTINENT HISTORY: Plan: [name] is a patient is about 4 weeks out right shoulder arthroscopy with rotator cuff tear repair using a 2 x 2 construct.  Biceps tenodesis also performed.  Patient did use the brace but she feels like is not helping her.  Having a lot of pain.  Taking oxycodone and muscle relaxer.  On examination she is somewhat predictably stiff.  Rotator cuff feels good with passive range of motion.  She has about 20 degrees of external rotation 70 degrees of abduction and about 80 of forward flexion.  Plan at this time is to discontinue sling.  No lifting with the right arm.  She needs therapy about 2-3 times a week for the next 4 weeks to work primarily on range of motion but it is okay to start  strengthening only at the 6-week postop mark which for her would be in 2 weeks.  Come back in 4 weeks for clinical recheck.  Encouraged her to get an overhead pulley so she can work on stretching it out a little  bit more for overhead flexion.  PAIN:  Are you having pain? Yes: NPRS scale: 10/10 Pain location: R shoulder Pain description: ache  Aggravating factors: AROM Relieving factors: meds and rests  PRECAUTIONS: Other: post-op RCR R 06/08/23  RED FLAGS: None   WEIGHT BEARING RESTRICTIONS: No  FALLS:  Has patient fallen in last 6 months? No   OCCUPATION: retired  PLOF: Independent  PATIENT GOALS:To regin the use of my shoulder  NEXT MD VISIT: 08/04/23  OBJECTIVE:   DIAGNOSTIC FINDINGS:  none  PATIENT SURVEYS:  FOTO 28(55 predicted)  POSTURE: Elevated R shoulder  UPPER EXTREMITY ROM:   PROM Right eval Left eval  Shoulder flexion 95d   Shoulder extension    Shoulder abduction 85d   Shoulder adduction    Shoulder internal rotation 65d   Shoulder external rotation 32d   Elbow flexion WNL   Elbow extension WNL   Wrist flexion    Wrist extension    Wrist ulnar deviation    Wrist radial deviation    Wrist pronation    Wrist supination    (Blank rows = not tested)  UPPER EXTREMITY MMT: Deferred due to post-op status  MMT Right eval Left eval  Shoulder flexion    Shoulder extension    Shoulder abduction    Shoulder adduction    Shoulder internal rotation    Shoulder external rotation    Middle trapezius    Lower trapezius    Elbow flexion    Elbow extension    Wrist flexion    Wrist extension    Wrist ulnar deviation    Wrist radial deviation    Wrist pronation    Wrist supination    Grip strength (lbs)    (Blank rows = not tested)  SHOULDER SPECIAL TESTS: deferred  JOINT MOBILITY TESTING:  deferred  PALPATION:  deferred   TODAY'S TREATMENT:     [facility] Adult PT Treatment:                                                DATE:  07/19/23 Therapeutic Exercise: Nustep L1 6 min Scapular retractions 10x Scapular depressions 10x Manual Therapy: PROM all planes 45d Abd, 70d IR, 40d ER, 135d FF Grade II mobs into distraction posterior and inferior directions 5x10  4 way scapula 15x ea.                                                                                                                                      DATE: 07/14/23 Eval and HEP    PATIENT EDUCATION: Education details: Discussed eval findings, rehab rationale and POC and patient is in agreement  Person educated: Patient Education method: Explanation Education comprehension: verbalized understanding and needs further education  HOME EXERCISE PROGRAM: Access Code: [email or web address]  URL: [email or web address] Date: 07/14/2023 Prepared by: Gustavus Bryant  Exercises - Seated Shoulder Shrug Circles AROM Backward  - 3 x daily - 5 x weekly - 1 sets - 10 reps - Seated Scapular Retraction  - 3 x daily - 5 x weekly - 1 sets - 10 reps - Seated Elbow Flexion and Extension AROM  - 3 x daily - 5 x weekly - 1 sets - 10 reps  ASSESSMENT:  CLINICAL IMPRESSION: Focus of today was PROM and increasing scapular mobility and periscapular control.  Restricted to PROM for 1 more week.  Responded well to manual techniques.    (Eval)Patient is a 72 y.o. female who was seen today for physical therapy evaluation and treatment for post op R RCR. Patient presents with high pain levels, guarding through R shoulder girdle and decreased PROM.  R wrist and elbow ROM WNL.  Patient is a good candidate for OPPT to help regain, mobility, strength and function and manage pain levels.  OBJECTIVE IMPAIRMENTS: decreased activity tolerance, decreased knowledge of condition, decreased knowledge of use of DME, decreased mobility, decreased ROM, decreased strength, increased fascial restrictions, impaired perceived functional ability, increased muscle spasms, impaired UE functional use,  postural dysfunction, and pain.   ACTIVITY LIMITATIONS: carrying, lifting, sleeping, bed mobility, bathing, toileting, dressing, reach over head, and hygiene/grooming  PARTICIPATION LIMITATIONS: meal prep, cleaning, laundry, medication management, driving, and community activity  PERSONAL FACTORS: Age, Fitness, Past/current experiences, and Time since onset of injury/illness/exacerbation are also affecting patient's functional outcome.   REHAB POTENTIAL: Good  CLINICAL DECISION MAKING: Evolving/moderate complexity  EVALUATION COMPLEXITY: Low   GOALS: Goals reviewed with patient? No  SHORT TERM GOALS: Target date: 08/11/2023    Patient to demonstrate independence in HEP  Baseline: [id number] Goal status: INITIAL  2.  Decrease worst pain to 6/10 Baseline: 10/10 Goal status: INITIAL  3.  Increase PROM R shoulder to Baseline:  PROM Right eval Left eval  Shoulder flexion    Shoulder extension    Shoulder abduction 85d   Shoulder adduction    Shoulder internal rotation 65d   Shoulder external rotation 32d   Elbow flexion WNL   Elbow extension WNL    Goal status: INITIAL   LONG TERM GOALS: Target date: 09/08/2023    Increase FOTO score to 55 Baseline: 28 Goal status: INITIAL  2.  Increase AROM R shoulder to 160d flexion and abduction Baseline: TBD at appropriate post-op interval Goal status: INITIAL  3.  Decrease worst pain to 4/10 Baseline: 10/10 Goal status: INITIAL  4.  Increase R shoulder strength to 4-/5 throughout Baseline: TBD at appropriate post-op interval Goal status: INITIAL    PLAN:  PT FREQUENCY: 1-2x/week  PT DURATION: 8 weeks  PLANNED INTERVENTIONS: Therapeutic exercises, Therapeutic activity, Neuromuscular re-education, Balance training, Gait training, Patient/Family education, Self Care, Joint mobilization, Dry Needling, Electrical stimulation, Cryotherapy, Moist heat, Manual therapy, and Re-evaluation  PLAN FOR NEXT SESSION: HEP review  and update, manual techniques as appropriate, aerobic tasks, ROM and flexibility activities, strengthening and PREs, TPDN, gait and balance training as needed     Hildred Laser, PT 07/19/2023, 4:14 PM

## 2023-07-19 ENCOUNTER — Ambulatory Visit: Payer: Medicare PPO | Attending: Orthopedic Surgery

## 2023-07-19 DIAGNOSIS — M6281 Muscle weakness (generalized): Secondary | ICD-10-CM | POA: Diagnosis not present

## 2023-07-19 DIAGNOSIS — M25511 Pain in right shoulder: Secondary | ICD-10-CM | POA: Diagnosis not present

## 2023-07-19 DIAGNOSIS — M12811 Other specific arthropathies, not elsewhere classified, right shoulder: Secondary | ICD-10-CM | POA: Insufficient documentation

## 2023-07-23 ENCOUNTER — Ambulatory Visit: Payer: Medicare PPO | Admitting: Physical Therapy

## 2023-07-23 ENCOUNTER — Encounter: Payer: Self-pay | Admitting: Physical Therapy

## 2023-07-23 ENCOUNTER — Other Ambulatory Visit: Payer: Self-pay

## 2023-07-23 DIAGNOSIS — M25511 Pain in right shoulder: Secondary | ICD-10-CM | POA: Diagnosis not present

## 2023-07-23 DIAGNOSIS — M12811 Other specific arthropathies, not elsewhere classified, right shoulder: Secondary | ICD-10-CM | POA: Diagnosis not present

## 2023-07-23 DIAGNOSIS — M6281 Muscle weakness (generalized): Secondary | ICD-10-CM

## 2023-07-23 NOTE — Therapy (Unsigned)
OUTPATIENT PHYSICAL THERAPY SHOULDER EVALUATION   Patient Name: [name] [name] MRN: [id number] DOB:1951/09/20, 72 y.o., female Today's Date: 07/23/2023  END OF SESSION:     Past Medical History:  Diagnosis Date   Acid reflux    ALCOHOL ABUSE, HX OF 11/06/2007   ALLERGIC RHINITIS 11/06/2007   Anxiety 04/03/2011   ASTHMA 09/13/2007   Asthma    Chronic pain syndrome 12/15/2016   COLONIC POLYPS, HX OF 02/09/2008    ADENOMATOUS POLYP   Coronary artery disease    Encounter for well adult exam without abnormal findings 04/03/2011   HYPERLIPIDEMIA 11/03/2010   HYPERTENSION 09/13/2007   HYPOTHYROIDISM 11/06/2007   Impaired glucose tolerance 07/30/2013   INSOMNIA, HX OF 09/13/2007   Lumbar degenerative disc disease 12/15/2016   OSTEOPOROSIS 11/06/2007   PAD (peripheral artery disease) (HCC)    PERIMENOPAUSAL STATUS 09/13/2007   RLS (restless legs syndrome)    Stroke (HCC)    TIA (transient ischemic attack) 11/04/2011   Past Surgical History:  Procedure Laterality Date   ABDOMINAL AORTOGRAM W/LOWER EXTREMITY N/A 08/27/2020   Procedure: ABDOMINAL AORTOGRAM W/LOWER EXTREMITY;  Surgeon: Elder Negus, MD;  Location: [facility];  Service: Cardiovascular;  Laterality: N/A;   BREAST EXCISIONAL BIOPSY Left    BREAST SURGERY  2008 and 2012   x 2 - benign, left side   carotid artery surgery Left    CESAREAN SECTION     x 3   COLONOSCOPY  multiple   2010   PERIPHERAL VASCULAR BALLOON ANGIOPLASTY  08/27/2020   Procedure: PERIPHERAL VASCULAR BALLOON ANGIOPLASTY;  Surgeon: Elder Negus, MD;  Location: [facility];  Service: Cardiovascular;;  Right SFA scoring balloon   SHOULDER ARTHROSCOPY WITH SUBACROMIAL DECOMPRESSION, ROTATOR CUFF REPAIR AND BICEP TENDON REPAIR Right 06/08/2023   Procedure: RIGHT SHOULDER ARTHROSCOPY, DEBRIDEMENT, MINI OPEN ROTATOR CUFF TEAR REPAIR AND BICEPS TENODESIS;  Surgeon: Cammy Copa, MD;  Location: [facility];  Service:  Orthopedics;  Laterality: Right;   TRANSFORAMINAL LUMBAR INTERBODY FUSION (TLIF) WITH PEDICLE SCREW FIXATION 1 LEVEL Left 07/10/2020   Procedure: LEFT-SIDED LUMBAR FOUR-FIVE TRANSFORAMINAL LUMBAR INTERBODY FUSION WITH INSTRUMENTATION AND ALLOGRAFT;  Surgeon: Estill Bamberg, MD;  Location: [facility];  Service: Orthopedics;  Laterality: Left;   Patient Active Problem List   Diagnosis Date Noted   Complete tear of right rotator cuff 06/13/2023   Biceps tendonitis on right 06/13/2023   Synovitis of right shoulder 06/13/2023   Claudication in peripheral vascular disease (HCC) 08/26/2020   Neurogenic claudication 07/10/2020   Degenerative lumbar spinal stenosis 05/16/2020   Sacroiliac pain 08/10/2019   Anxiety and depression 05/11/2019   Chest pain 05/09/2019   Arthritis of sacroiliac joint of both sides 05/01/2019   Polyarthralgia 12/28/2018   Preventative health care 10/20/2018   RLS (restless legs syndrome) 09/27/2018   Insomnia disorder related to known organic factor 06/27/2018   PLMD (periodic limb movement disorder) 06/27/2018   Hot flashes due to menopause 04/25/2018   Palpitations 04/25/2018   Diaphoresis 04/25/2018   Long term current use of antithrombotics/antiplatelets 04/19/2018   Insomnia 04/19/2018   Intractable episodic headache 02/28/2018   Numbness and tingling of right arm 02/28/2018   Toe pain, right 10/19/2017   Rotator cuff arthropathy of left shoulder 06/02/2017   Trigger thumb of left hand 06/02/2017   Lipoma 05/06/2017   Pain of left thumb 05/06/2017   Chronic pain syndrome 12/15/2016   Lumbar degenerative disc disease 12/15/2016   Left leg pain 01/28/2016   Left  shoulder pain 04/09/2015   Impaired glucose tolerance 07/30/2013   Abnormal breath sounds 07/30/2013   Asymptomatic bilateral carotid artery stenosis 05/05/2012   Anxiety 04/03/2011   CVA (cerebral infarction) 03/15/2011   HLD (hyperlipidemia) 11/03/2010   Hx of adenomatous colonic polyps 02/09/2008    Hypothyroidism 11/06/2007   ALLERGIC RHINITIS 11/06/2007   OSTEOPOROSIS 11/06/2007   ALCOHOL ABUSE, HX OF 11/06/2007   Essential hypertension 09/13/2007   Asthma 09/13/2007   PERIMENOPAUSAL STATUS 09/13/2007   Chronic insomnia 09/13/2007    PCP: [name], FNP   REFERRING PROVIDER: Cammy Copa, MD  REFERRING DIAG: M75.101 (ICD-10-CM) - Tear of right supraspinatus tendon  THERAPY DIAG:  No diagnosis found.  Rationale for Evaluation and Treatment: Rehabilitation  ONSET DATE: 06/08/23 surgery  SUBJECTIVE:                                                                                                                                                                                      SUBJECTIVE STATEMENT: Has been compliant with HEP.  Hand dominance: Right  PERTINENT HISTORY: Plan: [name] is a patient is about 4 weeks out right shoulder arthroscopy with rotator cuff tear repair using a 2 x 2 construct.  Biceps tenodesis also performed.  Patient did use the brace but she feels like is not helping her.  Having a lot of pain.  Taking oxycodone and muscle relaxer.  On examination she is somewhat predictably stiff.  Rotator cuff feels good with passive range of motion.  She has about 20 degrees of external rotation 70 degrees of abduction and about 80 of forward flexion.  Plan at this time is to discontinue sling.  No lifting with the right arm.  She needs therapy about 2-3 times a week for the next 4 weeks to work primarily on range of motion but it is okay to start strengthening only at the 6-week postop mark which for her would be in 2 weeks.  Come back in 4 weeks for clinical recheck.  Encouraged her to get an overhead pulley so she can work on stretching it out a little bit more for overhead flexion.  PAIN:  Are you having pain? Yes: NPRS scale: 10/10 Pain location: R shoulder Pain description: ache  Aggravating factors: AROM Relieving factors: meds and  rests  PRECAUTIONS: Other: post-op RCR R 06/08/23  RED FLAGS: None   WEIGHT BEARING RESTRICTIONS: No  FALLS:  Has patient fallen in last 6 months? No   OCCUPATION: retired  PLOF: Independent  PATIENT GOALS:To regin the use of my shoulder  NEXT MD VISIT: 08/04/23   OBJECTIVE:  PATIENT SURVEYS:  FOTO 28(55 predicted)  POSTURE:  Elevated R shoulder  UPPER EXTREMITY ROM:   PROM Right eval Left eval  Shoulder flexion 95d   Shoulder extension    Shoulder abduction 85d   Shoulder adduction    Shoulder internal rotation 65d   Shoulder external rotation 32d   Elbow flexion WNL   Elbow extension WNL   Wrist flexion    Wrist extension    Wrist ulnar deviation    Wrist radial deviation    Wrist pronation    Wrist supination    (Blank rows = not tested)  UPPER EXTREMITY MMT: Deferred due to post-op status  MMT Right eval Left eval  Shoulder flexion    Shoulder extension    Shoulder abduction    Shoulder adduction    Shoulder internal rotation    Shoulder external rotation    Middle trapezius    Lower trapezius    Elbow flexion    Elbow extension    Wrist flexion    Wrist extension    Wrist ulnar deviation    Wrist radial deviation    Wrist pronation    Wrist supination    Grip strength (lbs)    (Blank rows = not tested)  SHOULDER SPECIAL TESTS: deferred  JOINT MOBILITY TESTING:  deferred  PALPATION:  deferred    TODAY'S TREATMENT:     [facility] Adult PT Treatment:                                                DATE: 07/23/23 Therapeutic Exercise: Nustep L1 6 min Scapular retractions 10x Scapular depressions 10x Manual Therapy: PROM all planes 45d Abd, 70d IR, 40d ER, 135d FF Grade II mobs into distraction posterior and inferior directions 5x10  4 way scapula 15x ea.   [facility] Adult PT Treatment:                                                DATE: 07/19/23 Therapeutic Exercise: Nustep L1 6 min Scapular retractions 10x Scapular depressions  10x Manual Therapy: PROM all planes 45d Abd, 70d IR, 40d ER, 135d FF Grade II mobs into distraction posterior and inferior directions 5x10  4 way scapula 15x ea.  PATIENT EDUCATION: Education details: HEP Person educated: Patient Education method: Explanation Education comprehension: verbalized understanding and needs further education  HOME EXERCISE PROGRAM: Access Code: [email or web address]   ASSESSMENT: CLINICAL IMPRESSION:  Patient tolerated therapy well with no adverse effects. ***  Focus of today was PROM and increasing scapular mobility and periscapular control.  Restricted to PROM for 1 more week.  Responded well to manual techniques.     OBJECTIVE IMPAIRMENTS: decreased activity tolerance, decreased knowledge of condition, decreased knowledge of use of DME, decreased mobility, decreased ROM, decreased strength, increased fascial restrictions, impaired perceived functional ability, increased muscle spasms, impaired UE functional use, postural dysfunction, and pain.   ACTIVITY LIMITATIONS: carrying, lifting, sleeping, bed mobility, bathing, toileting, dressing, reach over head, and hygiene/grooming  PARTICIPATION LIMITATIONS: meal prep, cleaning, laundry, medication management, driving, and community activity  PERSONAL FACTORS: Age, Fitness, Past/current experiences, and Time since onset of injury/illness/exacerbation are also affecting patient's functional outcome.    GOALS: Goals reviewed with patient? No  SHORT TERM GOALS: Target date: 08/11/2023   Patient  to demonstrate independence in HEP  Baseline: [id number] Goal status: INITIAL  2.  Decrease worst pain to 6/10 Baseline: 10/10 Goal status: INITIAL  3.  Increase PROM R shoulder to Baseline:  PROM Right eval Left eval  Shoulder flexion    Shoulder extension    Shoulder abduction 85d   Shoulder adduction    Shoulder internal rotation 65d   Shoulder external rotation 32d   Elbow flexion WNL   Elbow extension WNL     Goal status: INITIAL  LONG TERM GOALS: Target date: 09/08/2023   Increase FOTO score to 55 Baseline: 28 Goal status: INITIAL  2.  Increase AROM R shoulder to 160d flexion and abduction Baseline: TBD at appropriate post-op interval Goal status: INITIAL  3.  Decrease worst pain to 4/10 Baseline: 10/10 Goal status: INITIAL  4.  Increase R shoulder strength to 4-/5 throughout Baseline: TBD at appropriate post-op interval Goal status: INITIAL    PLAN: PT FREQUENCY: 1-2x/week  PT DURATION: 8 weeks  PLANNED INTERVENTIONS: Therapeutic exercises, Therapeutic activity, Neuromuscular re-education, Balance training, Gait training, Patient/Family education, Self Care, Joint mobilization, Dry Needling, Electrical stimulation, Cryotherapy, Moist heat, Manual therapy, and Re-evaluation  PLAN FOR NEXT SESSION: HEP review and update, manual techniques as appropriate, aerobic tasks, ROM and flexibility activities, strengthening and PREs, TPDN, gait and balance training as needed     Rosana Hoes, PT, DPT, LAT, ATC 07/23/23  8:07 AM Phone: [phone] Fax: [phone]

## 2023-07-23 NOTE — Therapy (Signed)
OUTPATIENT PHYSICAL THERAPY TREATMENT   Patient Name: [name] [name] MRN: [id number] DOB:11-13-1951, 72 y.o., female Today's Date: 07/23/2023   END OF SESSION:  PT End of Session - 07/23/23 1108     Visit Number 3    Number of Visits 16    Date for PT Re-Evaluation 09/13/23    Authorization Type [employer]    Authorization Time Period 07/19/2023 - 09/07/2023    Authorization - Visit Number 2    Authorization - Number of Visits 12    PT Start Time 1108    PT Stop Time 1146    PT Time Calculation (min) 38 min    Activity Tolerance Patient tolerated treatment well    Behavior During Therapy WFL for tasks assessed/performed               Past Medical History:  Diagnosis Date   Acid reflux    ALCOHOL ABUSE, HX OF 11/06/2007   ALLERGIC RHINITIS 11/06/2007   Anxiety 04/03/2011   ASTHMA 09/13/2007   Asthma    Chronic pain syndrome 12/15/2016   COLONIC POLYPS, HX OF 02/09/2008    ADENOMATOUS POLYP   Coronary artery disease    Encounter for well adult exam without abnormal findings 04/03/2011   HYPERLIPIDEMIA 11/03/2010   HYPERTENSION 09/13/2007   HYPOTHYROIDISM 11/06/2007   Impaired glucose tolerance 07/30/2013   INSOMNIA, HX OF 09/13/2007   Lumbar degenerative disc disease 12/15/2016   OSTEOPOROSIS 11/06/2007   PAD (peripheral artery disease) (HCC)    PERIMENOPAUSAL STATUS 09/13/2007   RLS (restless legs syndrome)    Stroke (HCC)    TIA (transient ischemic attack) 11/04/2011   Past Surgical History:  Procedure Laterality Date   ABDOMINAL AORTOGRAM W/LOWER EXTREMITY N/A 08/27/2020   Procedure: ABDOMINAL AORTOGRAM W/LOWER EXTREMITY;  Surgeon: Elder Negus, MD;  Location: [facility];  Service: Cardiovascular;  Laterality: N/A;   BREAST EXCISIONAL BIOPSY Left    BREAST SURGERY  2008 and 2012   x 2 - benign, left side   carotid artery surgery Left    CESAREAN SECTION     x 3   COLONOSCOPY  multiple   2010   PERIPHERAL VASCULAR BALLOON  ANGIOPLASTY  08/27/2020   Procedure: PERIPHERAL VASCULAR BALLOON ANGIOPLASTY;  Surgeon: Elder Negus, MD;  Location: [facility];  Service: Cardiovascular;;  Right SFA scoring balloon   SHOULDER ARTHROSCOPY WITH SUBACROMIAL DECOMPRESSION, ROTATOR CUFF REPAIR AND BICEP TENDON REPAIR Right 06/08/2023   Procedure: RIGHT SHOULDER ARTHROSCOPY, DEBRIDEMENT, MINI OPEN ROTATOR CUFF TEAR REPAIR AND BICEPS TENODESIS;  Surgeon: Cammy Copa, MD;  Location: [facility];  Service: Orthopedics;  Laterality: Right;   TRANSFORAMINAL LUMBAR INTERBODY FUSION (TLIF) WITH PEDICLE SCREW FIXATION 1 LEVEL Left 07/10/2020   Procedure: LEFT-SIDED LUMBAR FOUR-FIVE TRANSFORAMINAL LUMBAR INTERBODY FUSION WITH INSTRUMENTATION AND ALLOGRAFT;  Surgeon: Estill Bamberg, MD;  Location: [facility];  Service: Orthopedics;  Laterality: Left;   Patient Active Problem List   Diagnosis Date Noted   Complete tear of right rotator cuff 06/13/2023   Biceps tendonitis on right 06/13/2023   Synovitis of right shoulder 06/13/2023   Claudication in peripheral vascular disease (HCC) 08/26/2020   Neurogenic claudication 07/10/2020   Degenerative lumbar spinal stenosis 05/16/2020   Sacroiliac pain 08/10/2019   Anxiety and depression 05/11/2019   Chest pain 05/09/2019   Arthritis of sacroiliac joint of both sides 05/01/2019   Polyarthralgia 12/28/2018   Preventative health care 10/20/2018   RLS (restless legs syndrome) 09/27/2018   Insomnia  disorder related to known organic factor 06/27/2018   PLMD (periodic limb movement disorder) 06/27/2018   Hot flashes due to menopause 04/25/2018   Palpitations 04/25/2018   Diaphoresis 04/25/2018   Long term current use of antithrombotics/antiplatelets 04/19/2018   Insomnia 04/19/2018   Intractable episodic headache 02/28/2018   Numbness and tingling of right arm 02/28/2018   Toe pain, right 10/19/2017   Rotator cuff arthropathy of left shoulder 06/02/2017   Trigger thumb of left hand  06/02/2017   Lipoma 05/06/2017   Pain of left thumb 05/06/2017   Chronic pain syndrome 12/15/2016   Lumbar degenerative disc disease 12/15/2016   Left leg pain 01/28/2016   Left shoulder pain 04/09/2015   Impaired glucose tolerance 07/30/2013   Abnormal breath sounds 07/30/2013   Asymptomatic bilateral carotid artery stenosis 05/05/2012   Anxiety 04/03/2011   CVA (cerebral infarction) 03/15/2011   HLD (hyperlipidemia) 11/03/2010   Hx of adenomatous colonic polyps 02/09/2008   Hypothyroidism 11/06/2007   ALLERGIC RHINITIS 11/06/2007   OSTEOPOROSIS 11/06/2007   ALCOHOL ABUSE, HX OF 11/06/2007   Essential hypertension 09/13/2007   Asthma 09/13/2007   PERIMENOPAUSAL STATUS 09/13/2007   Chronic insomnia 09/13/2007    PCP: [name], FNP   REFERRING PROVIDER: Cammy Copa, MD  REFERRING DIAG: M75.101 (ICD-10-CM) - Tear of right supraspinatus tendon  THERAPY DIAG:  Muscle weakness (generalized)  Rotator cuff arthropathy of right shoulder  Acute pain of right shoulder  Rationale for Evaluation and Treatment: Rehabilitation  ONSET DATE: 06/08/23 surgery   SUBJECTIVE:                                                                                                                                                                                     SUBJECTIVE STATEMENT: Patient reports no pain right now, she took a pain pill before she came in. She was having some pain yesterday but that has gotten better. She has been consistent with her exercises and using her pulley at home, stating she got it highest last night.  Hand dominance: Right  PERTINENT HISTORY: Plan: [name] is a patient is about 4 weeks out right shoulder arthroscopy with rotator cuff tear repair using a 2 x 2 construct.  Biceps tenodesis also performed.  Patient did use the brace but she feels like is not helping her.  Having a lot of pain.  Taking oxycodone and muscle relaxer.  On examination she is  somewhat predictably stiff.  Rotator cuff feels good with passive range of motion.  She has about 20 degrees of external rotation 70 degrees of abduction and about 80 of forward flexion.  Plan at this time is to discontinue sling.  No lifting with the right arm.  She needs therapy about 2-3 times a week for the next 4 weeks to work primarily on range of motion but it is okay to start strengthening only at the 6-week postop mark which for her would be in 2 weeks.  Come back in 4 weeks for clinical recheck.  Encouraged her to get an overhead pulley so she can work on stretching it out a little bit more for overhead flexion.  PAIN:  Are you having pain?  NPRS scale: 0/10 Pain location: R shoulder Pain description: ache  Aggravating factors: AROM Relieving factors: meds and rests  PRECAUTIONS: Other: post-op RCR R 06/08/23  RED FLAGS: None   WEIGHT BEARING RESTRICTIONS: No  FALLS:  Has patient fallen in last 6 months? No  PLOF: Independent  PATIENT GOALS:To regin the use of my shoulder  NEXT MD VISIT: 08/04/23   OBJECTIVE:  PATIENT SURVEYS:  FOTO 28(55 predicted)  POSTURE: Elevated R shoulder  UPPER EXTREMITY ROM:   PROM Right eval Left eval Rt / Lt 07/23/2023  Shoulder flexion 95d  115 / 160  Shoulder extension     Shoulder abduction 85d    Shoulder adduction     Shoulder internal rotation 65d    Shoulder external rotation 32d    Elbow flexion WNL    Elbow extension WNL    Wrist flexion     Wrist extension     Wrist ulnar deviation     Wrist radial deviation     Wrist pronation     Wrist supination     (Blank rows = not tested)  UPPER EXTREMITY MMT: Deferred due to post-op status  MMT Right eval Left eval  Shoulder flexion    Shoulder extension    Shoulder abduction    Shoulder adduction    Shoulder internal rotation    Shoulder external rotation    Middle trapezius    Lower trapezius    Elbow flexion    Elbow extension    Wrist flexion    Wrist  extension    Wrist ulnar deviation    Wrist radial deviation    Wrist pronation    Wrist supination    Grip strength (lbs)    (Blank rows = not tested)  SHOULDER SPECIAL TESTS: deferred  JOINT MOBILITY TESTING:  deferred  PALPATION:  deferred    TODAY'S TREATMENT:     [facility] Adult PT Treatment:                                                DATE: 07/23/23 Therapeutic Exercise: Supine dowel press 2 x 10 Seated overhead pulley for shoulder elevation x 4 min Step back stretch at counter 3 x 10 sec Seated table slide for shoulder elevation 5 x 10 sec Manual Therapy: Gentle right GHJ mobs to reduce muscle guarding  Right shoulder PROM primarily for shoulder elevation and ER at neutral and 90 deg abd   [facility] Adult PT Treatment:                                                DATE: 07/19/23 Therapeutic Exercise: Nustep L1 6 min Scapular retractions 10x Scapular depressions 10x Manual Therapy: PROM all  planes 45d Abd, 70d IR, 40d ER, 135d FF Grade II mobs into distraction posterior and inferior directions 5x10  4 way scapula 15x ea.  PATIENT EDUCATION: Education details: HEP update Person educated: Patient Education method: Explanation, Handout Education comprehension: verbalized understanding and needs further education  HOME EXERCISE PROGRAM: Access Code: [email or web address]   ASSESSMENT: CLINICAL IMPRESSION:  Patient tolerated therapy well with no adverse effects. Therapy focused on progressing her shoulder motion and range of motion, and initiated AAROM for shoulder elevation in a supine position. She does demonstrate improved right shoulder passive motion but continued to be limited with her motion in all planes. She reported no pain at rest but did have some increased pain at end ranges with stretching. She does require cueing to avoid shoulder shrug when performing exercises such as pulley and shoulder elevation stretching. Updated her HEP to progress stretching and AAROM with good  tolerance. Patient would benefit from continued skilled PT to progress her shoulder mobility and strengthening when protocol allows, in order to reduce pain and maximize functional ability.   OBJECTIVE IMPAIRMENTS: decreased activity tolerance, decreased knowledge of condition, decreased knowledge of use of DME, decreased mobility, decreased ROM, decreased strength, increased fascial restrictions, impaired perceived functional ability, increased muscle spasms, impaired UE functional use, postural dysfunction, and pain.   ACTIVITY LIMITATIONS: carrying, lifting, sleeping, bed mobility, bathing, toileting, dressing, reach over head, and hygiene/grooming  PARTICIPATION LIMITATIONS: meal prep, cleaning, laundry, medication management, driving, and community activity  PERSONAL FACTORS: Age, Fitness, Past/current experiences, and Time since onset of injury/illness/exacerbation are also affecting patient's functional outcome.    GOALS: Goals reviewed with patient? No  SHORT TERM GOALS: Target date: 08/11/2023   Patient to demonstrate independence in HEP  Baseline: [id number] Goal status: INITIAL  2.  Decrease worst pain to 6/10 Baseline: 10/10 Goal status: INITIAL  3.  Increase PROM R shoulder to Baseline:  PROM Right eval Left eval  Shoulder flexion    Shoulder extension    Shoulder abduction 85d   Shoulder adduction    Shoulder internal rotation 65d   Shoulder external rotation 32d   Elbow flexion WNL   Elbow extension WNL    Goal status: INITIAL  LONG TERM GOALS: Target date: 09/08/2023   Increase FOTO score to 55 Baseline: 28 Goal status: INITIAL  2.  Increase AROM R shoulder to 160d flexion and abduction Baseline: TBD at appropriate post-op interval Goal status: INITIAL  3.  Decrease worst pain to 4/10 Baseline: 10/10 Goal status: INITIAL  4.  Increase R shoulder strength to 4-/5 throughout Baseline: TBD at appropriate post-op interval Goal status:  INITIAL    PLAN: PT FREQUENCY: 1-2x/week  PT DURATION: 8 weeks  PLANNED INTERVENTIONS: Therapeutic exercises, Therapeutic activity, Neuromuscular re-education, Balance training, Gait training, Patient/Family education, Self Care, Joint mobilization, Dry Needling, Electrical stimulation, Cryotherapy, Moist heat, Manual therapy, and Re-evaluation  PLAN FOR NEXT SESSION: HEP review and update, manual techniques as appropriate, aerobic tasks, ROM and flexibility activities, strengthening and PREs, TPDN, gait and balance training as needed     Rosana Hoes, PT, DPT, LAT, ATC 07/23/23  11:50 AM Phone: [phone] Fax: [phone]

## 2023-07-26 ENCOUNTER — Ambulatory Visit: Payer: Medicare PPO

## 2023-07-26 DIAGNOSIS — M12811 Other specific arthropathies, not elsewhere classified, right shoulder: Secondary | ICD-10-CM

## 2023-07-26 DIAGNOSIS — M6281 Muscle weakness (generalized): Secondary | ICD-10-CM

## 2023-07-26 DIAGNOSIS — M25511 Pain in right shoulder: Secondary | ICD-10-CM | POA: Diagnosis not present

## 2023-07-27 NOTE — Therapy (Unsigned)
OUTPATIENT PHYSICAL THERAPY SHOULDER EVALUATION   Patient Name: [name] [name] MRN: [id number] DOB:04-26-1951, 72 y.o., female Today's Date: 07/28/2023  END OF SESSION:  PT End of Session - 07/28/23 1100     Visit Number 5    Number of Visits 16    Date for PT Re-Evaluation 09/13/23    Authorization Type [employer]    Authorization Time Period 07/19/2023 - 09/07/2023    Authorization - Number of Visits 12    PT Start Time 1100    PT Stop Time 1130    PT Time Calculation (min) 30 min    Activity Tolerance Patient tolerated treatment well    Behavior During Therapy [facility] for tasks assessed/performed                Past Medical History:  Diagnosis Date   Acid reflux    ALCOHOL ABUSE, HX OF 11/06/2007   ALLERGIC RHINITIS 11/06/2007   Anxiety 04/03/2011   ASTHMA 09/13/2007   Asthma    Chronic pain syndrome 12/15/2016   COLONIC POLYPS, HX OF 02/09/2008    ADENOMATOUS POLYP   Coronary artery disease    Encounter for well adult exam without abnormal findings 04/03/2011   HYPERLIPIDEMIA 11/03/2010   HYPERTENSION 09/13/2007   HYPOTHYROIDISM 11/06/2007   Impaired glucose tolerance 07/30/2013   INSOMNIA, HX OF 09/13/2007   Lumbar degenerative disc disease 12/15/2016   OSTEOPOROSIS 11/06/2007   PAD (peripheral artery disease) (HCC)    PERIMENOPAUSAL STATUS 09/13/2007   RLS (restless legs syndrome)    Stroke (HCC)    TIA (transient ischemic attack) 11/04/2011   Past Surgical History:  Procedure Laterality Date   ABDOMINAL AORTOGRAM W/LOWER EXTREMITY N/A 08/27/2020   Procedure: ABDOMINAL AORTOGRAM W/LOWER EXTREMITY;  Surgeon: Elder Negus, MD;  Location: [facility];  Service: Cardiovascular;  Laterality: N/A;   BREAST EXCISIONAL BIOPSY Left    BREAST SURGERY  2008 and 2012   x 2 - benign, left side   carotid artery surgery Left    CESAREAN SECTION     x 3   COLONOSCOPY  multiple   2010   PERIPHERAL VASCULAR BALLOON ANGIOPLASTY  08/27/2020    Procedure: PERIPHERAL VASCULAR BALLOON ANGIOPLASTY;  Surgeon: Elder Negus, MD;  Location: [facility];  Service: Cardiovascular;;  Right SFA scoring balloon   SHOULDER ARTHROSCOPY WITH SUBACROMIAL DECOMPRESSION, ROTATOR CUFF REPAIR AND BICEP TENDON REPAIR Right 06/08/2023   Procedure: RIGHT SHOULDER ARTHROSCOPY, DEBRIDEMENT, MINI OPEN ROTATOR CUFF TEAR REPAIR AND BICEPS TENODESIS;  Surgeon: Cammy Copa, MD;  Location: [facility] OR;  Service: Orthopedics;  Laterality: Right;   TRANSFORAMINAL LUMBAR INTERBODY FUSION (TLIF) WITH PEDICLE SCREW FIXATION 1 LEVEL Left 07/10/2020   Procedure: LEFT-SIDED LUMBAR FOUR-FIVE TRANSFORAMINAL LUMBAR INTERBODY FUSION WITH INSTRUMENTATION AND ALLOGRAFT;  Surgeon: Estill Bamberg, MD;  Location: [facility];  Service: Orthopedics;  Laterality: Left;   Patient Active Problem List   Diagnosis Date Noted   Complete tear of right rotator cuff 06/13/2023   Biceps tendonitis on right 06/13/2023   Synovitis of right shoulder 06/13/2023   Claudication in peripheral vascular disease (HCC) 08/26/2020   Neurogenic claudication 07/10/2020   Degenerative lumbar spinal stenosis 05/16/2020   Sacroiliac pain 08/10/2019   Anxiety and depression 05/11/2019   Chest pain 05/09/2019   Arthritis of sacroiliac joint of both sides 05/01/2019   Polyarthralgia 12/28/2018   Preventative health care 10/20/2018   RLS (restless legs syndrome) 09/27/2018   Insomnia disorder related to known organic factor 06/27/2018  PLMD (periodic limb movement disorder) 06/27/2018   Hot flashes due to menopause 04/25/2018   Palpitations 04/25/2018   Diaphoresis 04/25/2018   Long term current use of antithrombotics/antiplatelets 04/19/2018   Insomnia 04/19/2018   Intractable episodic headache 02/28/2018   Numbness and tingling of right arm 02/28/2018   Toe pain, right 10/19/2017   Rotator cuff arthropathy of left shoulder 06/02/2017   Trigger thumb of left hand 06/02/2017   Lipoma  05/06/2017   Pain of left thumb 05/06/2017   Chronic pain syndrome 12/15/2016   Lumbar degenerative disc disease 12/15/2016   Left leg pain 01/28/2016   Left shoulder pain 04/09/2015   Impaired glucose tolerance 07/30/2013   Abnormal breath sounds 07/30/2013   Asymptomatic bilateral carotid artery stenosis 05/05/2012   Anxiety 04/03/2011   CVA (cerebral infarction) 03/15/2011   HLD (hyperlipidemia) 11/03/2010   Hx of adenomatous colonic polyps 02/09/2008   Hypothyroidism 11/06/2007   ALLERGIC RHINITIS 11/06/2007   OSTEOPOROSIS 11/06/2007   ALCOHOL ABUSE, HX OF 11/06/2007   Essential hypertension 09/13/2007   Asthma 09/13/2007   PERIMENOPAUSAL STATUS 09/13/2007   Chronic insomnia 09/13/2007    PCP: [name], FNP   REFERRING PROVIDER: Cammy Copa, MD  REFERRING DIAG: M75.101 (ICD-10-CM) - Tear of right supraspinatus tendon  THERAPY DIAG:  Muscle weakness (generalized)  Rotator cuff arthropathy of right shoulder  Acute pain of right shoulder  Rationale for Evaluation and Treatment: Rehabilitation  ONSET DATE: 06/08/23 surgery  SUBJECTIVE:                                                                                                                                                                                      SUBJECTIVE STATEMENT: Reports increased discomfort following last session but does not feel she has lost any motion.  Has been trying to use her arm more  Hand dominance: Right  PERTINENT HISTORY: Plan: [name] is a patient is about 4 weeks out right shoulder arthroscopy with rotator cuff tear repair using a 2 x 2 construct.  Biceps tenodesis also performed.  Patient did use the brace but she feels like is not helping her.  Having a lot of pain.  Taking oxycodone and muscle relaxer.  On examination she is somewhat predictably stiff.  Rotator cuff feels good with passive range of motion.  She has about 20 degrees of external rotation 70  degrees of abduction and about 80 of forward flexion.  Plan at this time is to discontinue sling.  No lifting with the right arm.  She needs therapy about 2-3 times a week for the next 4 weeks to work primarily on range of motion but it is  okay to start strengthening only at the 6-week postop mark which for her would be in 2 weeks.  Come back in 4 weeks for clinical recheck.  Encouraged her to get an overhead pulley so she can work on stretching it out a little bit more for overhead flexion.  PAIN:  Are you having pain? Yes: NPRS scale: 10/10 Pain location: R shoulder Pain description: ache  Aggravating factors: AROM Relieving factors: meds and rests  PRECAUTIONS: Other: post-op RCR R 06/08/23  RED FLAGS: None   WEIGHT BEARING RESTRICTIONS: No  FALLS:  Has patient fallen in last 6 months? No   OCCUPATION: retired  PLOF: Independent  PATIENT GOALS:To regin the use of my shoulder  NEXT MD VISIT: 08/04/23   OBJECTIVE:  PATIENT SURVEYS:  FOTO 28(55 predicted)  POSTURE: Elevated R shoulder  UPPER EXTREMITY ROM:   PROM Right eval Left eval  Shoulder flexion 95d   Shoulder extension    Shoulder abduction 85d   Shoulder adduction    Shoulder internal rotation 65d   Shoulder external rotation 32d   Elbow flexion WNL   Elbow extension WNL   Wrist flexion    Wrist extension    Wrist ulnar deviation    Wrist radial deviation    Wrist pronation    Wrist supination    (Blank rows = not tested)  UPPER EXTREMITY MMT: Deferred due to post-op status  MMT Right eval Left eval  Shoulder flexion    Shoulder extension    Shoulder abduction    Shoulder adduction    Shoulder internal rotation    Shoulder external rotation    Middle trapezius    Lower trapezius    Elbow flexion    Elbow extension    Wrist flexion    Wrist extension    Wrist ulnar deviation    Wrist radial deviation    Wrist pronation    Wrist supination    Grip strength (lbs)    (Blank rows = not  tested)  SHOULDER SPECIAL TESTS: deferred  JOINT MOBILITY TESTING:  deferred  PALPATION:  deferred    TODAY'S TREATMENT:     [facility] Adult PT Treatment:                                                DATE: 07/28/23 Therapeutic Exercise: Nustep L2 6 min Seated scaption 10x B Supine PNF D1 F/E 15x Manual Therapy: PROM all planes 95d Abd, 70d IR, 45d ER, 140d FF Grade II mobs into distraction posterior and inferior directions 5x10  4 way scapula 15x ea.  [facility] Adult PT Treatment:                                                DATE: 07/26/23 Therapeutic Exercise: Nustep L2 6 min Seated scaption 10x B Supine PNF D1 F/E 15x Manual Therapy: PROM all planes 80d Abd, 70d IR, 40d ER, 140d FF Grade II mobs into distraction posterior and inferior directions 5x10  4 way scapula 15x ea.  [facility] Adult PT Treatment:  DATE: 07/23/23 Therapeutic Exercise: Nustep L1 6 min Scapular retractions 10x Scapular depressions 10x Manual Therapy: PROM all planes 45d Abd, 70d IR, 40d ER, 135d FF Grade II mobs into distraction posterior and inferior directions 5x10  4 way scapula 15x ea.   [facility] Adult PT Treatment:                                                DATE: 07/19/23 Therapeutic Exercise: Nustep L1 6 min Scapular retractions 10x Scapular depressions 10x Manual Therapy: PROM all planes 45d Abd, 70d IR, 40d ER, 135d FF Grade II mobs into distraction posterior and inferior directions 5x10  4 way scapula 15x ea.  PATIENT EDUCATION: Education details: HEP Person educated: Patient Education method: Explanation Education comprehension: verbalized understanding and needs further education  HOME EXERCISE PROGRAM: Access Code: [email or web address]   ASSESSMENT: CLINICAL IMPRESSION:  Patient arrived late, session abbreviated accordingly.  Focus was continued ROM and periscapular strength and mobility.  PROM remained  limited by active trigger points in R pec  minor and teres major.  ROM values increased as noted.  Focus of today was PROM and increasing scapular mobility and periscapular control.  Restricted to PROM for 1 more week.  Responded well to manual techniques.     OBJECTIVE IMPAIRMENTS: decreased activity tolerance, decreased knowledge of condition, decreased knowledge of use of DME, decreased mobility, decreased ROM, decreased strength, increased fascial restrictions, impaired perceived functional ability, increased muscle spasms, impaired UE functional use, postural dysfunction, and pain.   ACTIVITY LIMITATIONS: carrying, lifting, sleeping, bed mobility, bathing, toileting, dressing, reach over head, and hygiene/grooming  PARTICIPATION LIMITATIONS: meal prep, cleaning, laundry, medication management, driving, and community activity  PERSONAL FACTORS: Age, Fitness, Past/current experiences, and Time since onset of injury/illness/exacerbation are also affecting patient's functional outcome.    GOALS: Goals reviewed with patient? No  SHORT TERM GOALS: Target date: 08/11/2023   Patient to demonstrate independence in HEP  Baseline: [id number] Goal status: INITIAL  2.  Decrease worst pain to 6/10 Baseline: 10/10 Goal status: INITIAL  3.  Increase PROM R shoulder to Baseline:  PROM Right eval Left eval  Shoulder flexion    Shoulder extension    Shoulder abduction 85d   Shoulder adduction    Shoulder internal rotation 65d   Shoulder external rotation 32d   Elbow flexion WNL   Elbow extension WNL    Goal status: INITIAL  LONG TERM GOALS: Target date: 09/08/2023   Increase FOTO score to 55 Baseline: 28 Goal status: INITIAL  2.  Increase AROM R shoulder to 160d flexion and abduction Baseline: TBD at appropriate post-op interval Goal status: INITIAL  3.  Decrease worst pain to 4/10 Baseline: 10/10 Goal status: INITIAL  4.  Increase R shoulder strength to 4-/5 throughout Baseline: TBD at appropriate post-op  interval Goal status: INITIAL    PLAN: PT FREQUENCY: 1-2x/week  PT DURATION: 8 weeks  PLANNED INTERVENTIONS: Therapeutic exercises, Therapeutic activity, Neuromuscular re-education, Balance training, Gait training, Patient/Family education, Self Care, Joint mobilization, Dry Needling, Electrical stimulation, Cryotherapy, Moist heat, Manual therapy, and Re-evaluation  PLAN FOR NEXT SESSION: HEP review and update, manual techniques as appropriate, aerobic tasks, ROM and flexibility activities, strengthening and PREs, TPDN, gait and balance training as needed     Rosana Hoes, PT, DPT, LAT, ATC 07/28/23  11:31 AM Phone: [phone] Fax: [phone]

## 2023-07-28 ENCOUNTER — Ambulatory Visit: Payer: Medicare PPO

## 2023-07-28 DIAGNOSIS — M25511 Pain in right shoulder: Secondary | ICD-10-CM | POA: Diagnosis not present

## 2023-07-28 DIAGNOSIS — M12811 Other specific arthropathies, not elsewhere classified, right shoulder: Secondary | ICD-10-CM

## 2023-07-28 DIAGNOSIS — M6281 Muscle weakness (generalized): Secondary | ICD-10-CM | POA: Diagnosis not present

## 2023-07-30 NOTE — Therapy (Unsigned)
OUTPATIENT PHYSICAL THERAPY SHOULDER EVALUATION   Patient Name: [name] [name] MRN: [id number] DOB:September 15, 1951, 72 y.o., female Today's Date: 08/02/2023  END OF SESSION:  PT End of Session - 08/02/23 1049     Visit Number 6    Number of Visits 16    Date for PT Re-Evaluation 09/13/23    Authorization Type [employer]    Authorization Time Period 07/19/2023 - 09/07/2023    Authorization - Number of Visits 12    PT Start Time 1045    PT Stop Time 1130    PT Time Calculation (min) 45 min    Activity Tolerance Patient tolerated treatment well    Behavior During Therapy [facility] for tasks assessed/performed                 Past Medical History:  Diagnosis Date   Acid reflux    ALCOHOL ABUSE, HX OF 11/06/2007   ALLERGIC RHINITIS 11/06/2007   Anxiety 04/03/2011   ASTHMA 09/13/2007   Asthma    Chronic pain syndrome 12/15/2016   COLONIC POLYPS, HX OF 02/09/2008    ADENOMATOUS POLYP   Coronary artery disease    Encounter for well adult exam without abnormal findings 04/03/2011   HYPERLIPIDEMIA 11/03/2010   HYPERTENSION 09/13/2007   HYPOTHYROIDISM 11/06/2007   Impaired glucose tolerance 07/30/2013   INSOMNIA, HX OF 09/13/2007   Lumbar degenerative disc disease 12/15/2016   OSTEOPOROSIS 11/06/2007   PAD (peripheral artery disease) (HCC)    PERIMENOPAUSAL STATUS 09/13/2007   RLS (restless legs syndrome)    Stroke (HCC)    TIA (transient ischemic attack) 11/04/2011   Past Surgical History:  Procedure Laterality Date   ABDOMINAL AORTOGRAM W/LOWER EXTREMITY N/A 08/27/2020   Procedure: ABDOMINAL AORTOGRAM W/LOWER EXTREMITY;  Surgeon: Elder Negus, MD;  Location: [facility];  Service: Cardiovascular;  Laterality: N/A;   BREAST EXCISIONAL BIOPSY Left    BREAST SURGERY  2008 and 2012   x 2 - benign, left side   carotid artery surgery Left    CESAREAN SECTION     x 3   COLONOSCOPY  multiple   2010   PERIPHERAL VASCULAR BALLOON ANGIOPLASTY  08/27/2020    Procedure: PERIPHERAL VASCULAR BALLOON ANGIOPLASTY;  Surgeon: Elder Negus, MD;  Location: [facility];  Service: Cardiovascular;;  Right SFA scoring balloon   SHOULDER ARTHROSCOPY WITH SUBACROMIAL DECOMPRESSION, ROTATOR CUFF REPAIR AND BICEP TENDON REPAIR Right 06/08/2023   Procedure: RIGHT SHOULDER ARTHROSCOPY, DEBRIDEMENT, MINI OPEN ROTATOR CUFF TEAR REPAIR AND BICEPS TENODESIS;  Surgeon: Cammy Copa, MD;  Location: [facility];  Service: Orthopedics;  Laterality: Right;   TRANSFORAMINAL LUMBAR INTERBODY FUSION (TLIF) WITH PEDICLE SCREW FIXATION 1 LEVEL Left 07/10/2020   Procedure: LEFT-SIDED LUMBAR FOUR-FIVE TRANSFORAMINAL LUMBAR INTERBODY FUSION WITH INSTRUMENTATION AND ALLOGRAFT;  Surgeon: Estill Bamberg, MD;  Location: [facility];  Service: Orthopedics;  Laterality: Left;   Patient Active Problem List   Diagnosis Date Noted   Complete tear of right rotator cuff 06/13/2023   Biceps tendonitis on right 06/13/2023   Synovitis of right shoulder 06/13/2023   Claudication in peripheral vascular disease (HCC) 08/26/2020   Neurogenic claudication 07/10/2020   Degenerative lumbar spinal stenosis 05/16/2020   Sacroiliac pain 08/10/2019   Anxiety and depression 05/11/2019   Chest pain 05/09/2019   Arthritis of sacroiliac joint of both sides 05/01/2019   Polyarthralgia 12/28/2018   Preventative health care 10/20/2018   RLS (restless legs syndrome) 09/27/2018   Insomnia disorder related to known organic factor  06/27/2018   PLMD (periodic limb movement disorder) 06/27/2018   Hot flashes due to menopause 04/25/2018   Palpitations 04/25/2018   Diaphoresis 04/25/2018   Long term current use of antithrombotics/antiplatelets 04/19/2018   Insomnia 04/19/2018   Intractable episodic headache 02/28/2018   Numbness and tingling of right arm 02/28/2018   Toe pain, right 10/19/2017   Rotator cuff arthropathy of left shoulder 06/02/2017   Trigger thumb of left hand 06/02/2017   Lipoma  05/06/2017   Pain of left thumb 05/06/2017   Chronic pain syndrome 12/15/2016   Lumbar degenerative disc disease 12/15/2016   Left leg pain 01/28/2016   Left shoulder pain 04/09/2015   Impaired glucose tolerance 07/30/2013   Abnormal breath sounds 07/30/2013   Asymptomatic bilateral carotid artery stenosis 05/05/2012   Anxiety 04/03/2011   CVA (cerebral infarction) 03/15/2011   HLD (hyperlipidemia) 11/03/2010   Hx of adenomatous colonic polyps 02/09/2008   Hypothyroidism 11/06/2007   ALLERGIC RHINITIS 11/06/2007   OSTEOPOROSIS 11/06/2007   ALCOHOL ABUSE, HX OF 11/06/2007   Essential hypertension 09/13/2007   Asthma 09/13/2007   PERIMENOPAUSAL STATUS 09/13/2007   Chronic insomnia 09/13/2007    PCP: [name], FNP   REFERRING PROVIDER: Cammy Copa, MD  REFERRING DIAG: M75.101 (ICD-10-CM) - Tear of right supraspinatus tendon  THERAPY DIAG:  Muscle weakness (generalized)  Rotator cuff arthropathy of right shoulder  Acute pain of right shoulder  Rationale for Evaluation and Treatment: Rehabilitation  ONSET DATE: 06/08/23 surgery  SUBJECTIVE:                                                                                                                                                                                      SUBJECTIVE STATEMENT: Reports increased discomfort following last session but does not feel she has lost any motion.  Has been trying to use her arm more  Hand dominance: Right  PERTINENT HISTORY: Plan: [name] [name] is a patient is about 4 weeks out right shoulder arthroscopy with rotator cuff tear repair using a 2 x 2 construct.  Biceps tenodesis also performed.  Patient did use the brace but she feels like is not helping her.  Having a lot of pain.  Taking oxycodone and muscle relaxer.  On examination she is somewhat predictably stiff.  Rotator cuff feels good with passive range of motion.  She has about 20 degrees of external rotation 70  degrees of abduction and about 80 of forward flexion.  Plan at this time is to discontinue sling.  No lifting with the right arm.  She needs therapy about 2-3 times a week for the next 4 weeks to work primarily on range of motion  but it is okay to start strengthening only at the 6-week postop mark which for her would be in 2 weeks.  Come back in 4 weeks for clinical recheck.  Encouraged her to get an overhead pulley so she can work on stretching it out a little bit more for overhead flexion.  PAIN:  Are you having pain? Yes: NPRS scale: 10/10 Pain location: R shoulder Pain description: ache  Aggravating factors: AROM Relieving factors: meds and rests  PRECAUTIONS: Other: post-op RCR R 06/08/23  RED FLAGS: None   WEIGHT BEARING RESTRICTIONS: No  FALLS:  Has patient fallen in last 6 months? No   OCCUPATION: retired  PLOF: Independent  PATIENT GOALS:To regin the use of my shoulder  NEXT MD VISIT: 08/04/23   OBJECTIVE:  PATIENT SURVEYS:  FOTO 28(55 predicted)  POSTURE: Elevated R shoulder  UPPER EXTREMITY ROM:   PROM Right eval Left eval  Shoulder flexion 95d   Shoulder extension    Shoulder abduction 85d   Shoulder adduction    Shoulder internal rotation 65d   Shoulder external rotation 32d   Elbow flexion WNL   Elbow extension WNL   Wrist flexion    Wrist extension    Wrist ulnar deviation    Wrist radial deviation    Wrist pronation    Wrist supination    (Blank rows = not tested)  UPPER EXTREMITY MMT: Deferred due to post-op status  MMT Right eval Left eval  Shoulder flexion    Shoulder extension    Shoulder abduction    Shoulder adduction    Shoulder internal rotation    Shoulder external rotation    Middle trapezius    Lower trapezius    Elbow flexion    Elbow extension    Wrist flexion    Wrist extension    Wrist ulnar deviation    Wrist radial deviation    Wrist pronation    Wrist supination    Grip strength (lbs)    (Blank rows = not  tested)  SHOULDER SPECIAL TESTS: deferred  JOINT MOBILITY TESTING:  deferred  PALPATION:  deferred    TODAY'S TREATMENT:     [facility] Adult PT Treatment:                                                DATE: 08/02/23 Therapeutic Exercise: Nustep L2 6 min (Tennis ball used as weight) Supine press/protract 15x  Supine flexion 15x S/L ER 15x S/L abd 15x Prone flexion 15x Prone extension 15x Prone hor abd 15x Manual Therapy: Skilled palpation to identify taught bands in R trapezius and teres major Trigger Point Dry Needling Treatment: Pre-treatment instruction: Patient instructed on dry needling rationale, procedures, and possible side effects including pain during treatment (achy,cramping feeling), bruising, drop of blood, lightheadedness, nausea, sweating. Patient Consent Given: Yes Education handout provided: No Muscles treated: R UT and teres major  Needle size and number: .25x65mm x 2 Electrical stimulation performed: No Parameters: N/A Treatment response/outcome: Twitch response elicited, Palpable decrease in muscle tension, and less discomfort with AROM Post-treatment instructions: Patient instructed to expect possible mild to moderate muscle soreness later today and/or tomorrow. Patient instructed in methods to reduce muscle soreness and to continue prescribed HEP. If patient was dry needled over the lung field, patient was instructed on signs and symptoms of pneumothorax and, however unlikely, to see immediate  medical attention should they occur. Patient was also educated on signs and symptoms of infection and to seek medical attention should they occur. Patient verbalized understanding of these instructions and education.    [facility] Adult PT Treatment:                                                DATE: 07/28/23 Therapeutic Exercise: Nustep L2 6 min Seated scaption 10x B Supine PNF D1 F/E 15x Manual Therapy: PROM all planes 95d Abd, 70d IR, 45d ER, 140d FF Grade II mobs into  distraction posterior and inferior directions 5x10  4 way scapula 15x ea.  [facility] Adult PT Treatment:                                                DATE: 07/26/23 Therapeutic Exercise: Nustep L2 6 min Seated scaption 10x B Supine PNF D1 F/E 15x Manual Therapy: PROM all planes 80d Abd, 70d IR, 40d ER, 140d FF Grade II mobs into distraction posterior and inferior directions 5x10  4 way scapula 15x ea.  [facility] Adult PT Treatment:                                                DATE: 07/23/23 Therapeutic Exercise: Nustep L1 6 min Scapular retractions 10x Scapular depressions 10x Manual Therapy: PROM all planes 45d Abd, 70d IR, 40d ER, 135d FF Grade II mobs into distraction posterior and inferior directions 5x10  4 way scapula 15x ea.   [facility] Adult PT Treatment:                                                DATE: 07/19/23 Therapeutic Exercise: Nustep L1 6 min Scapular retractions 10x Scapular depressions 10x Manual Therapy: PROM all planes 45d Abd, 70d IR, 40d ER, 135d FF Grade II mobs into distraction posterior and inferior directions 5x10  4 way scapula 15x ea.  PATIENT EDUCATION: Education details: HEP Person educated: Patient Education method: Explanation Education comprehension: verbalized understanding and needs further education  HOME EXERCISE PROGRAM: Access Code: [email or web address]   ASSESSMENT: CLINICAL IMPRESSION: Today's session incorporated TPDN as noted above f/b aerobic work for increased circulation prior to initiating AROM.  Initially noted discomfort with AROM but symptoms lessened with repetitions.  Marked fatigue noted following AROM tasks.   OBJECTIVE IMPAIRMENTS: decreased activity tolerance, decreased knowledge of condition, decreased knowledge of use of DME, decreased mobility, decreased ROM, decreased strength, increased fascial restrictions, impaired perceived functional ability, increased muscle spasms, impaired UE functional use, postural dysfunction, and pain.    ACTIVITY LIMITATIONS: carrying, lifting, sleeping, bed mobility, bathing, toileting, dressing, reach over head, and hygiene/grooming  PARTICIPATION LIMITATIONS: meal prep, cleaning, laundry, medication management, driving, and community activity  PERSONAL FACTORS: Age, Fitness, Past/current experiences, and Time since onset of injury/illness/exacerbation are also affecting patient's functional outcome.    GOALS: Goals reviewed with patient? No  SHORT TERM GOALS: Target date: 08/11/2023   Patient to demonstrate  independence in HEP  Baseline: [id number] Goal status: INITIAL  2.  Decrease worst pain to 6/10 Baseline: 10/10 Goal status: INITIAL  3.  Increase PROM R shoulder to Baseline:  PROM Right eval Left eval  Shoulder flexion    Shoulder extension    Shoulder abduction 85d   Shoulder adduction    Shoulder internal rotation 65d   Shoulder external rotation 32d   Elbow flexion WNL   Elbow extension WNL    Goal status: INITIAL  LONG TERM GOALS: Target date: 09/08/2023   Increase FOTO score to 55 Baseline: 28 Goal status: INITIAL  2.  Increase AROM R shoulder to 160d flexion and abduction Baseline: TBD at appropriate post-op interval Goal status: INITIAL  3.  Decrease worst pain to 4/10 Baseline: 10/10 Goal status: INITIAL  4.  Increase R shoulder strength to 4-/5 throughout Baseline: TBD at appropriate post-op interval Goal status: INITIAL    PLAN: PT FREQUENCY: 1-2x/week  PT DURATION: 8 weeks  PLANNED INTERVENTIONS: Therapeutic exercises, Therapeutic activity, Neuromuscular re-education, Balance training, Gait training, Patient/Family education, Self Care, Joint mobilization, Dry Needling, Electrical stimulation, Cryotherapy, Moist heat, Manual therapy, and Re-evaluation  PLAN FOR NEXT SESSION: HEP review and update, manual techniques as appropriate, aerobic tasks, ROM and flexibility activities, strengthening and PREs, TPDN, gait and balance training as  needed     Rosana Hoes, PT, DPT, LAT, ATC 08/02/23  11:35 AM Phone: [phone] Fax: [phone]

## 2023-08-02 ENCOUNTER — Ambulatory Visit: Payer: Medicare PPO

## 2023-08-02 DIAGNOSIS — M12811 Other specific arthropathies, not elsewhere classified, right shoulder: Secondary | ICD-10-CM | POA: Diagnosis not present

## 2023-08-02 DIAGNOSIS — M6281 Muscle weakness (generalized): Secondary | ICD-10-CM

## 2023-08-02 DIAGNOSIS — M25511 Pain in right shoulder: Secondary | ICD-10-CM

## 2023-08-04 ENCOUNTER — Ambulatory Visit (INDEPENDENT_AMBULATORY_CARE_PROVIDER_SITE_OTHER): Payer: Medicare PPO | Admitting: Orthopedic Surgery

## 2023-08-04 DIAGNOSIS — M75101 Unspecified rotator cuff tear or rupture of right shoulder, not specified as traumatic: Secondary | ICD-10-CM

## 2023-08-04 MED ORDER — METHOCARBAMOL 500 MG PO TABS: 500.0000 mg | ORAL_TABLET | Freq: Three times a day (TID) | ORAL | 2 refills | Status: DC | PRN

## 2023-08-05 ENCOUNTER — Ambulatory Visit: Payer: Medicare PPO

## 2023-08-05 ENCOUNTER — Encounter: Payer: Self-pay | Admitting: Orthopedic Surgery

## 2023-08-05 NOTE — Progress Notes (Signed)
Post-Op Visit Note   Patient: [name] [name]           Date of Birth: July 17, 1951           MRN: [id number] Visit Date: 08/04/2023 PCP: [name], FNP   Assessment & Plan:  Chief Complaint:  Chief Complaint  Patient presents with   Right Shoulder - Routine Post Op    right shoulder rotator cuff repair and biceps tenodesis on 06/08/2023   Visit Diagnoses:  1. Tear of right supraspinatus tendon     Plan: Will need underwent right shoulder arthroscopy with rotator cuff repair and biceps tenodesis on 06/08/2023.  Patient states she is in therapy 2 times a week.  She has good and bad days.  Current range of motion is 25/85/120.  4 out of 5 external rotation strength.  She does have home exercise program.  She is taking pain medicine and muscle relaxers.  She is being judicious about using them.  I think okay to refill her pain medicine 1 time in the future if needed.  Plan at this time is to go with home health physical therapy 2 times a week for 6 weeks for passive range of motion with external rotation as well as home exercise program for strengthening.  Follow-up with Franky Macho in 4 weeks for clinical recheck.  Follow-Up Instructions: Return in about 4 weeks (around 09/01/2023).   Orders:  No orders of the defined types were placed in this encounter.  Meds ordered this encounter  Medications   methocarbamol (ROBAXIN) 500 MG tablet    Sig: Take 1 tablet (500 mg total) by mouth every 8 (eight) hours as needed.    Dispense:  30 tablet    Refill:  2    Imaging: No results found.  PMFS History: Patient Active Problem List   Diagnosis Date Noted   Complete tear of right rotator cuff 06/13/2023   Biceps tendonitis on right 06/13/2023   Synovitis of right shoulder 06/13/2023   Claudication in peripheral vascular disease (HCC) 08/26/2020   Neurogenic claudication 07/10/2020   Degenerative lumbar spinal stenosis 05/16/2020   Sacroiliac pain 08/10/2019   Anxiety and  depression 05/11/2019   Chest pain 05/09/2019   Arthritis of sacroiliac joint of both sides 05/01/2019   Polyarthralgia 12/28/2018   Preventative health care 10/20/2018   RLS (restless legs syndrome) 09/27/2018   Insomnia disorder related to known organic factor 06/27/2018   PLMD (periodic limb movement disorder) 06/27/2018   Hot flashes due to menopause 04/25/2018   Palpitations 04/25/2018   Diaphoresis 04/25/2018   Long term current use of antithrombotics/antiplatelets 04/19/2018   Insomnia 04/19/2018   Intractable episodic headache 02/28/2018   Numbness and tingling of right arm 02/28/2018   Toe pain, right 10/19/2017   Rotator cuff arthropathy of left shoulder 06/02/2017   Trigger thumb of left hand 06/02/2017   Lipoma 05/06/2017   Pain of left thumb 05/06/2017   Chronic pain syndrome 12/15/2016   Lumbar degenerative disc disease 12/15/2016   Left leg pain 01/28/2016   Left shoulder pain 04/09/2015   Impaired glucose tolerance 07/30/2013   Abnormal breath sounds 07/30/2013   Asymptomatic bilateral carotid artery stenosis 05/05/2012   Anxiety 04/03/2011   CVA (cerebral infarction) 03/15/2011   HLD (hyperlipidemia) 11/03/2010   Hx of adenomatous colonic polyps 02/09/2008   Hypothyroidism 11/06/2007   ALLERGIC RHINITIS 11/06/2007   OSTEOPOROSIS 11/06/2007   ALCOHOL ABUSE, HX OF 11/06/2007   Essential hypertension 09/13/2007   Asthma 09/13/2007  PERIMENOPAUSAL STATUS 09/13/2007   Chronic insomnia 09/13/2007   Past Medical History:  Diagnosis Date   Acid reflux    ALCOHOL ABUSE, HX OF 11/06/2007   ALLERGIC RHINITIS 11/06/2007   Anxiety 04/03/2011   ASTHMA 09/13/2007   Asthma    Chronic pain syndrome 12/15/2016   COLONIC POLYPS, HX OF 02/09/2008    ADENOMATOUS POLYP   Coronary artery disease    Encounter for well adult exam without abnormal findings 04/03/2011   HYPERLIPIDEMIA 11/03/2010   HYPERTENSION 09/13/2007   HYPOTHYROIDISM 11/06/2007   Impaired glucose  tolerance 07/30/2013   INSOMNIA, HX OF 09/13/2007   Lumbar degenerative disc disease 12/15/2016   OSTEOPOROSIS 11/06/2007   PAD (peripheral artery disease) (HCC)    PERIMENOPAUSAL STATUS 09/13/2007   RLS (restless legs syndrome)    Stroke (HCC)    TIA (transient ischemic attack) 11/04/2011    Family History  Problem Relation Age of Onset   Hypertension Mother 23   Heart disease Mother    Heart failure Mother 11   Hypertension Brother    Colon cancer Brother 80   Dementia Maternal Grandmother    Breast cancer Cousin    Hyperlipidemia Other    Hypertension Other    Coronary artery disease Other     Past Surgical History:  Procedure Laterality Date   ABDOMINAL AORTOGRAM W/LOWER EXTREMITY N/A 08/27/2020   Procedure: ABDOMINAL AORTOGRAM W/LOWER EXTREMITY;  Surgeon: Elder Negus, MD;  Location: [facility];  Service: Cardiovascular;  Laterality: N/A;   BREAST EXCISIONAL BIOPSY Left    BREAST SURGERY  2008 and 2012   x 2 - benign, left side   carotid artery surgery Left    CESAREAN SECTION     x 3   COLONOSCOPY  multiple   2010   PERIPHERAL VASCULAR BALLOON ANGIOPLASTY  08/27/2020   Procedure: PERIPHERAL VASCULAR BALLOON ANGIOPLASTY;  Surgeon: Elder Negus, MD;  Location: [facility];  Service: Cardiovascular;;  Right SFA scoring balloon   SHOULDER ARTHROSCOPY WITH SUBACROMIAL DECOMPRESSION, ROTATOR CUFF REPAIR AND BICEP TENDON REPAIR Right 06/08/2023   Procedure: RIGHT SHOULDER ARTHROSCOPY, DEBRIDEMENT, MINI OPEN ROTATOR CUFF TEAR REPAIR AND BICEPS TENODESIS;  Surgeon: Cammy Copa, MD;  Location: [facility];  Service: Orthopedics;  Laterality: Right;   TRANSFORAMINAL LUMBAR INTERBODY FUSION (TLIF) WITH PEDICLE SCREW FIXATION 1 LEVEL Left 07/10/2020   Procedure: LEFT-SIDED LUMBAR FOUR-FIVE TRANSFORAMINAL LUMBAR INTERBODY FUSION WITH INSTRUMENTATION AND ALLOGRAFT;  Surgeon: Estill Bamberg, MD;  Location: [facility];  Service: Orthopedics;  Laterality: Left;    Social History   Occupational History   Occupation: Banker: [employer]  Tobacco Use   Smoking status: Former    Current packs/day: 0.00    Types: Cigarettes    Quit date: 05/31/1979    Years since quitting: 44.2   Smokeless tobacco: Current    Types: Snuff   Tobacco comments:    social smoker  Vaping Use   Vaping status: Never Used  Substance and Sexual Activity   Alcohol use: Not Currently   Drug use: Not Currently   Sexual activity: Not on file

## 2023-08-06 ENCOUNTER — Other Ambulatory Visit: Payer: Self-pay

## 2023-08-06 ENCOUNTER — Encounter: Payer: Self-pay | Admitting: Orthopedic Surgery

## 2023-08-06 DIAGNOSIS — G8929 Other chronic pain: Secondary | ICD-10-CM

## 2023-08-17 ENCOUNTER — Telehealth: Payer: Self-pay | Admitting: Orthopedic Surgery

## 2023-08-17 ENCOUNTER — Other Ambulatory Visit: Payer: Self-pay | Admitting: Orthopedic Surgery

## 2023-08-17 MED ORDER — METHOCARBAMOL 500 MG PO TABS: 500.0000 mg | ORAL_TABLET | Freq: Three times a day (TID) | ORAL | 2 refills | Status: DC | PRN

## 2023-08-17 MED ORDER — OXYCODONE HCL 5 MG PO TABS: 5.0000 mg | ORAL_TABLET | Freq: Three times a day (TID) | ORAL | 0 refills | Status: DC | PRN

## 2023-08-17 NOTE — Telephone Encounter (Signed)
Patient called. Would like oxycodone and her muscle relaxer called in to her pharmacy. Her cb# [phone]

## 2023-08-17 NOTE — Therapy (Deleted)
OUTPATIENT PHYSICAL THERAPY SHOULDER EVALUATION   Patient Name: [name] Marsicano [name] MRN: [id number] DOB:1951-10-01, 72 y.o., female Today's Date: 08/17/2023  END OF SESSION:        Past Medical History:  Diagnosis Date   Acid reflux    ALCOHOL ABUSE, HX OF 11/06/2007   ALLERGIC RHINITIS 11/06/2007   Anxiety 04/03/2011   ASTHMA 09/13/2007   Asthma    Chronic pain syndrome 12/15/2016   COLONIC POLYPS, HX OF 02/09/2008    ADENOMATOUS POLYP   Coronary artery disease    Encounter for well adult exam without abnormal findings 04/03/2011   HYPERLIPIDEMIA 11/03/2010   HYPERTENSION 09/13/2007   HYPOTHYROIDISM 11/06/2007   Impaired glucose tolerance 07/30/2013   INSOMNIA, HX OF 09/13/2007   Lumbar degenerative disc disease 12/15/2016   OSTEOPOROSIS 11/06/2007   PAD (peripheral artery disease) (HCC)    PERIMENOPAUSAL STATUS 09/13/2007   RLS (restless legs syndrome)    Stroke (HCC)    TIA (transient ischemic attack) 11/04/2011   Past Surgical History:  Procedure Laterality Date   ABDOMINAL AORTOGRAM W/LOWER EXTREMITY N/A 08/27/2020   Procedure: ABDOMINAL AORTOGRAM W/LOWER EXTREMITY;  Surgeon: Elder Negus, MD;  Location: [facility];  Service: Cardiovascular;  Laterality: N/A;   BREAST EXCISIONAL BIOPSY Left    BREAST SURGERY  2008 and 2012   x 2 - benign, left side   carotid artery surgery Left    CESAREAN SECTION     x 3   COLONOSCOPY  multiple   2010   PERIPHERAL VASCULAR BALLOON ANGIOPLASTY  08/27/2020   Procedure: PERIPHERAL VASCULAR BALLOON ANGIOPLASTY;  Surgeon: Elder Negus, MD;  Location: [facility];  Service: Cardiovascular;;  Right SFA scoring balloon   SHOULDER ARTHROSCOPY WITH SUBACROMIAL DECOMPRESSION, ROTATOR CUFF REPAIR AND BICEP TENDON REPAIR Right 06/08/2023   Procedure: RIGHT SHOULDER ARTHROSCOPY, DEBRIDEMENT, MINI OPEN ROTATOR CUFF TEAR REPAIR AND BICEPS TENODESIS;  Surgeon: Cammy Copa, MD;  Location: [facility];  Service:  Orthopedics;  Laterality: Right;   TRANSFORAMINAL LUMBAR INTERBODY FUSION (TLIF) WITH PEDICLE SCREW FIXATION 1 LEVEL Left 07/10/2020   Procedure: LEFT-SIDED LUMBAR FOUR-FIVE TRANSFORAMINAL LUMBAR INTERBODY FUSION WITH INSTRUMENTATION AND ALLOGRAFT;  Surgeon: Estill Bamberg, MD;  Location: [facility];  Service: Orthopedics;  Laterality: Left;   Patient Active Problem List   Diagnosis Date Noted   Complete tear of right rotator cuff 06/13/2023   Biceps tendonitis on right 06/13/2023   Synovitis of right shoulder 06/13/2023   Claudication in peripheral vascular disease (HCC) 08/26/2020   Neurogenic claudication 07/10/2020   Degenerative lumbar spinal stenosis 05/16/2020   Sacroiliac pain 08/10/2019   Anxiety and depression 05/11/2019   Chest pain 05/09/2019   Arthritis of sacroiliac joint of both sides 05/01/2019   Polyarthralgia 12/28/2018   Preventative health care 10/20/2018   RLS (restless legs syndrome) 09/27/2018   Insomnia disorder related to known organic factor 06/27/2018   PLMD (periodic limb movement disorder) 06/27/2018   Hot flashes due to menopause 04/25/2018   Palpitations 04/25/2018   Diaphoresis 04/25/2018   Long term current use of antithrombotics/antiplatelets 04/19/2018   Insomnia 04/19/2018   Intractable episodic headache 02/28/2018   Numbness and tingling of right arm 02/28/2018   Toe pain, right 10/19/2017   Rotator cuff arthropathy of left shoulder 06/02/2017   Trigger thumb of left hand 06/02/2017   Lipoma 05/06/2017   Pain of left thumb 05/06/2017   Chronic pain syndrome 12/15/2016   Lumbar degenerative disc disease 12/15/2016   Left leg pain 01/28/2016  Left shoulder pain 04/09/2015   Impaired glucose tolerance 07/30/2013   Abnormal breath sounds 07/30/2013   Asymptomatic bilateral carotid artery stenosis 05/05/2012   Anxiety 04/03/2011   CVA (cerebral infarction) 03/15/2011   HLD (hyperlipidemia) 11/03/2010   Hx of adenomatous colonic polyps 02/09/2008    Hypothyroidism 11/06/2007   ALLERGIC RHINITIS 11/06/2007   OSTEOPOROSIS 11/06/2007   ALCOHOL ABUSE, HX OF 11/06/2007   Essential hypertension 09/13/2007   Asthma 09/13/2007   PERIMENOPAUSAL STATUS 09/13/2007   Chronic insomnia 09/13/2007    PCP: [name], FNP   REFERRING PROVIDER: Cammy Copa, MD  REFERRING DIAG: M75.101 (ICD-10-CM) - Tear of right supraspinatus tendon  THERAPY DIAG:  No diagnosis found.  Rationale for Evaluation and Treatment: Rehabilitation  ONSET DATE: 06/08/23 surgery  SUBJECTIVE:                                                                                                                                                                                      SUBJECTIVE STATEMENT:   Hand dominance: Right  PERTINENT HISTORY: Plan: [name] [name] is a patient is about 4 weeks out right shoulder arthroscopy with rotator cuff tear repair using a 2 x 2 construct.  Biceps tenodesis also performed.  Patient did use the brace but she feels like is not helping her.  Having a lot of pain.  Taking oxycodone and muscle relaxer.  On examination she is somewhat predictably stiff.  Rotator cuff feels good with passive range of motion.  She has about 20 degrees of external rotation 70 degrees of abduction and about 80 of forward flexion.  Plan at this time is to discontinue sling.  No lifting with the right arm.  She needs therapy about 2-3 times a week for the next 4 weeks to work primarily on range of motion but it is okay to start strengthening only at the 6-week postop mark which for her would be in 2 weeks.  Come back in 4 weeks for clinical recheck.  Encouraged her to get an overhead pulley so she can work on stretching it out a little bit more for overhead flexion.  PAIN:  Are you having pain? Yes: NPRS scale: ***10/10 Pain location: R shoulder Pain description: ache  Aggravating factors: AROM Relieving factors: meds and rests  PRECAUTIONS: Other:  post-op RCR R 06/08/23  RED FLAGS: None   WEIGHT BEARING RESTRICTIONS: No  FALLS:  Has patient fallen in last 6 months? No   OCCUPATION: retired  PLOF: Independent  PATIENT GOALS:To regin the use of my shoulder  NEXT MD VISIT: 08/04/23   OBJECTIVE:  PATIENT SURVEYS:  FOTO 28(55 predicted)  POSTURE: Elevated R shoulder  UPPER EXTREMITY ROM:   PROM Right eval Left eval  Shoulder flexion 95d   Shoulder extension    Shoulder abduction 85d   Shoulder adduction    Shoulder internal rotation 65d   Shoulder external rotation 32d   Elbow flexion WNL   Elbow extension WNL   Wrist flexion    Wrist extension    Wrist ulnar deviation    Wrist radial deviation    Wrist pronation    Wrist supination    (Blank rows = not tested)  UPPER EXTREMITY MMT: Deferred due to post-op status  MMT Right eval Left eval  Shoulder flexion    Shoulder extension    Shoulder abduction    Shoulder adduction    Shoulder internal rotation    Shoulder external rotation    Middle trapezius    Lower trapezius    Elbow flexion    Elbow extension    Wrist flexion    Wrist extension    Wrist ulnar deviation    Wrist radial deviation    Wrist pronation    Wrist supination    Grip strength (lbs)    (Blank rows = not tested)  SHOULDER SPECIAL TESTS: deferred  JOINT MOBILITY TESTING:  deferred  PALPATION:  deferred    TODAY'S TREATMENT:     [facility] Adult PT Treatment:                                                DATE: 08/05/23 Therapeutic Exercise: Nustep L2 6 min Tennis ball used as weight: Supine press/protract 15x  Supine flexion 15x S/L ER 15x S/L abd 15x Prone flexion 15x Prone extension 15x Prone hor abd 15x Manual Therapy: PROM all planes 95d Abd, 70d IR, 45d ER, 140d FF Grade II mobs into distraction posterior and inferior directions 5x10    [facility] Adult PT Treatment:                                                DATE: 08/02/23 Therapeutic Exercise: Nustep L2  6 min (Tennis ball used as weight) Supine press/protract 15x  Supine flexion 15x S/L ER 15x S/L abd 15x Prone flexion 15x Prone extension 15x Prone hor abd 15x Manual Therapy: Skilled palpation to identify taught bands in R trapezius and teres major Trigger Point Dry Needling Treatment: Pre-treatment instruction: Patient instructed on dry needling rationale, procedures, and possible side effects including pain during treatment (achy,cramping feeling), bruising, drop of blood, lightheadedness, nausea, sweating. Patient Consent Given: Yes Education handout provided: No Muscles treated: R UT and teres major  Needle size and number: .25x73mm x 2 Electrical stimulation performed: No Parameters: N/A Treatment response/outcome: Twitch response elicited, Palpable decrease in muscle tension, and less discomfort with AROM Post-treatment instructions: Patient instructed to expect possible mild to moderate muscle soreness later today and/or tomorrow. Patient instructed in methods to reduce muscle soreness and to continue prescribed HEP. If patient was dry needled over the lung field, patient was instructed on signs and symptoms of pneumothorax and, however unlikely, to see immediate medical attention should they occur. Patient was also educated on signs and symptoms of infection and to seek medical attention should they occur. Patient verbalized understanding of these instructions and education.  [facility] Adult PT Treatment:                                                DATE: 07/28/23 Therapeutic Exercise: Nustep L2 6 min Seated scaption 10x B Supine PNF D1 F/E 15x Manual Therapy: PROM all planes 95d Abd, 70d IR, 45d ER, 140d FF Grade II mobs into distraction posterior and inferior directions 5x10  4 way scapula 15x ea.    PATIENT EDUCATION: Education details: HEP Person educated: Patient Education method: Explanation Education comprehension: verbalized understanding and needs further  education  HOME EXERCISE PROGRAM: Access Code: [email or web address]   ASSESSMENT: CLINICAL IMPRESSION:  ***  Today's session incorporated TPDN as noted above f/b aerobic work for increased circulation prior to initiating AROM.  Initially noted discomfort with AROM but symptoms lessened with repetitions.  Marked fatigue noted following AROM tasks.   OBJECTIVE IMPAIRMENTS: decreased activity tolerance, decreased knowledge of condition, decreased knowledge of use of DME, decreased mobility, decreased ROM, decreased strength, increased fascial restrictions, impaired perceived functional ability, increased muscle spasms, impaired UE functional use, postural dysfunction, and pain.   ACTIVITY LIMITATIONS: carrying, lifting, sleeping, bed mobility, bathing, toileting, dressing, reach over head, and hygiene/grooming  PARTICIPATION LIMITATIONS: meal prep, cleaning, laundry, medication management, driving, and community activity  PERSONAL FACTORS: Age, Fitness, Past/current experiences, and Time since onset of injury/illness/exacerbation are also affecting patient's functional outcome.    GOALS: Goals reviewed with patient? No  SHORT TERM GOALS: Target date: 08/11/2023   Patient to demonstrate independence in HEP  Baseline: [id number] Goal status: INITIAL  2.  Decrease worst pain to 6/10 Baseline: 10/10 Goal status: INITIAL  3.  Increase PROM R shoulder to Baseline:  PROM Right eval Left eval  Shoulder flexion    Shoulder extension    Shoulder abduction 85d   Shoulder adduction    Shoulder internal rotation 65d   Shoulder external rotation 32d   Elbow flexion WNL   Elbow extension WNL    Goal status: INITIAL  LONG TERM GOALS: Target date: 09/08/2023   Increase FOTO score to 55 Baseline: 28 Goal status: INITIAL  2.  Increase AROM R shoulder to 160d flexion and abduction Baseline: TBD at appropriate post-op interval Goal status: INITIAL  3.  Decrease worst pain to 4/10 Baseline:  10/10 Goal status: INITIAL  4.  Increase R shoulder strength to 4-/5 throughout Baseline: TBD at appropriate post-op interval Goal status: INITIAL    PLAN: PT FREQUENCY: 1-2x/week  PT DURATION: 8 weeks  PLANNED INTERVENTIONS: Therapeutic exercises, Therapeutic activity, Neuromuscular re-education, Balance training, Gait training, Patient/Family education, Self Care, Joint mobilization, Dry Needling, Electrical stimulation, Cryotherapy, Moist heat, Manual therapy, and Re-evaluation  PLAN FOR NEXT SESSION: HEP review and update, manual techniques as appropriate, aerobic tasks, ROM and flexibility activities, strengthening and PREs, TPDN, gait and balance training as needed     Hildred Laser PT 08/17/23  4:05 PM Phone: [phone] Fax: [phone]

## 2023-08-17 NOTE — Telephone Encounter (Signed)
Meds sent pls call thx

## 2023-08-17 NOTE — Telephone Encounter (Signed)
Please advise on message below. Thank you!

## 2023-08-18 NOTE — Telephone Encounter (Signed)
Talked with patient and she stated the pharmacy advised her that they are out of both Rx's. Would like different Rx's sent to her pharmacy.

## 2023-08-21 ENCOUNTER — Other Ambulatory Visit: Payer: Self-pay | Admitting: Family

## 2023-08-23 ENCOUNTER — Ambulatory Visit: Payer: Medicare PPO

## 2023-08-23 NOTE — Telephone Encounter (Signed)
Which one?

## 2023-08-23 NOTE — Therapy (Unsigned)
OUTPATIENT PHYSICAL THERAPY SHOULDER EVALUATION   Patient Name: [name] [name] MRN: [id number] DOB:04-30-1951, 72 y.o., female Today's Date: 08/23/2023  END OF SESSION:        Past Medical History:  Diagnosis Date   Acid reflux    ALCOHOL ABUSE, HX OF 11/06/2007   ALLERGIC RHINITIS 11/06/2007   Anxiety 04/03/2011   ASTHMA 09/13/2007   Asthma    Chronic pain syndrome 12/15/2016   COLONIC POLYPS, HX OF 02/09/2008    ADENOMATOUS POLYP   Coronary artery disease    Encounter for well adult exam without abnormal findings 04/03/2011   HYPERLIPIDEMIA 11/03/2010   HYPERTENSION 09/13/2007   HYPOTHYROIDISM 11/06/2007   Impaired glucose tolerance 07/30/2013   INSOMNIA, HX OF 09/13/2007   Lumbar degenerative disc disease 12/15/2016   OSTEOPOROSIS 11/06/2007   PAD (peripheral artery disease) (HCC)    PERIMENOPAUSAL STATUS 09/13/2007   RLS (restless legs syndrome)    Stroke (HCC)    TIA (transient ischemic attack) 11/04/2011   Past Surgical History:  Procedure Laterality Date   ABDOMINAL AORTOGRAM W/LOWER EXTREMITY N/A 08/27/2020   Procedure: ABDOMINAL AORTOGRAM W/LOWER EXTREMITY;  Surgeon: Elder Negus, MD;  Location: [facility];  Service: Cardiovascular;  Laterality: N/A;   BREAST EXCISIONAL BIOPSY Left    BREAST SURGERY  2008 and 2012   x 2 - benign, left side   carotid artery surgery Left    CESAREAN SECTION     x 3   COLONOSCOPY  multiple   2010   PERIPHERAL VASCULAR BALLOON ANGIOPLASTY  08/27/2020   Procedure: PERIPHERAL VASCULAR BALLOON ANGIOPLASTY;  Surgeon: Elder Negus, MD;  Location: [facility];  Service: Cardiovascular;;  Right SFA scoring balloon   SHOULDER ARTHROSCOPY WITH SUBACROMIAL DECOMPRESSION, ROTATOR CUFF REPAIR AND BICEP TENDON REPAIR Right 06/08/2023   Procedure: RIGHT SHOULDER ARTHROSCOPY, DEBRIDEMENT, MINI OPEN ROTATOR CUFF TEAR REPAIR AND BICEPS TENODESIS;  Surgeon: Cammy Copa, MD;  Location: [facility];  Service:  Orthopedics;  Laterality: Right;   TRANSFORAMINAL LUMBAR INTERBODY FUSION (TLIF) WITH PEDICLE SCREW FIXATION 1 LEVEL Left 07/10/2020   Procedure: LEFT-SIDED LUMBAR FOUR-FIVE TRANSFORAMINAL LUMBAR INTERBODY FUSION WITH INSTRUMENTATION AND ALLOGRAFT;  Surgeon: Estill Bamberg, MD;  Location: [facility];  Service: Orthopedics;  Laterality: Left;   Patient Active Problem List   Diagnosis Date Noted   Complete tear of right rotator cuff 06/13/2023   Biceps tendonitis on right 06/13/2023   Synovitis of right shoulder 06/13/2023   Claudication in peripheral vascular disease (HCC) 08/26/2020   Neurogenic claudication 07/10/2020   Degenerative lumbar spinal stenosis 05/16/2020   Sacroiliac pain 08/10/2019   Anxiety and depression 05/11/2019   Chest pain 05/09/2019   Arthritis of sacroiliac joint of both sides 05/01/2019   Polyarthralgia 12/28/2018   Preventative health care 10/20/2018   RLS (restless legs syndrome) 09/27/2018   Insomnia disorder related to known organic factor 06/27/2018   PLMD (periodic limb movement disorder) 06/27/2018   Hot flashes due to menopause 04/25/2018   Palpitations 04/25/2018   Diaphoresis 04/25/2018   Long term current use of antithrombotics/antiplatelets 04/19/2018   Insomnia 04/19/2018   Intractable episodic headache 02/28/2018   Numbness and tingling of right arm 02/28/2018   Toe pain, right 10/19/2017   Rotator cuff arthropathy of left shoulder 06/02/2017   Trigger thumb of left hand 06/02/2017   Lipoma 05/06/2017   Pain of left thumb 05/06/2017   Chronic pain syndrome 12/15/2016   Lumbar degenerative disc disease 12/15/2016   Left leg pain 01/28/2016  Left shoulder pain 04/09/2015   Impaired glucose tolerance 07/30/2013   Abnormal breath sounds 07/30/2013   Asymptomatic bilateral carotid artery stenosis 05/05/2012   Anxiety 04/03/2011   CVA (cerebral infarction) 03/15/2011   HLD (hyperlipidemia) 11/03/2010   Hx of adenomatous colonic polyps 02/09/2008    Hypothyroidism 11/06/2007   ALLERGIC RHINITIS 11/06/2007   OSTEOPOROSIS 11/06/2007   ALCOHOL ABUSE, HX OF 11/06/2007   Essential hypertension 09/13/2007   Asthma 09/13/2007   PERIMENOPAUSAL STATUS 09/13/2007   Chronic insomnia 09/13/2007    PCP: [name], FNP   REFERRING PROVIDER: Cammy Copa, MD  REFERRING DIAG: M75.101 (ICD-10-CM) - Tear of right supraspinatus tendon  THERAPY DIAG:  No diagnosis found.  Rationale for Evaluation and Treatment: Rehabilitation  ONSET DATE: 06/08/23 surgery  SUBJECTIVE:                                                                                                                                                                                      SUBJECTIVE STATEMENT:   Hand dominance: Right  PERTINENT HISTORY: Plan: [name] is a patient is about 4 weeks out right shoulder arthroscopy with rotator cuff tear repair using a 2 x 2 construct.  Biceps tenodesis also performed.  Patient did use the brace but she feels like is not helping her.  Having a lot of pain.  Taking oxycodone and muscle relaxer.  On examination she is somewhat predictably stiff.  Rotator cuff feels good with passive range of motion.  She has about 20 degrees of external rotation 70 degrees of abduction and about 80 of forward flexion.  Plan at this time is to discontinue sling.  No lifting with the right arm.  She needs therapy about 2-3 times a week for the next 4 weeks to work primarily on range of motion but it is okay to start strengthening only at the 6-week postop mark which for her would be in 2 weeks.  Come back in 4 weeks for clinical recheck.  Encouraged her to get an overhead pulley so she can work on stretching it out a little bit more for overhead flexion.  PAIN:  Are you having pain? Yes: NPRS scale: ***10/10 Pain location: R shoulder Pain description: ache  Aggravating factors: AROM Relieving factors: meds and rests  PRECAUTIONS: Other:  post-op RCR R 06/08/23  RED FLAGS: None   WEIGHT BEARING RESTRICTIONS: No  FALLS:  Has patient fallen in last 6 months? No   OCCUPATION: retired  PLOF: Independent  PATIENT GOALS:To regin the use of my shoulder  NEXT MD VISIT: 08/04/23   OBJECTIVE:  PATIENT SURVEYS:  FOTO 28(55 predicted)  POSTURE: Elevated R shoulder  UPPER EXTREMITY ROM:   PROM Right eval Left eval  Shoulder flexion 95d   Shoulder extension    Shoulder abduction 85d   Shoulder adduction    Shoulder internal rotation 65d   Shoulder external rotation 32d   Elbow flexion WNL   Elbow extension WNL   Wrist flexion    Wrist extension    Wrist ulnar deviation    Wrist radial deviation    Wrist pronation    Wrist supination    (Blank rows = not tested)  UPPER EXTREMITY MMT: Deferred due to post-op status  MMT Right eval Left eval  Shoulder flexion    Shoulder extension    Shoulder abduction    Shoulder adduction    Shoulder internal rotation    Shoulder external rotation    Middle trapezius    Lower trapezius    Elbow flexion    Elbow extension    Wrist flexion    Wrist extension    Wrist ulnar deviation    Wrist radial deviation    Wrist pronation    Wrist supination    Grip strength (lbs)    (Blank rows = not tested)  SHOULDER SPECIAL TESTS: deferred  JOINT MOBILITY TESTING:  deferred  PALPATION:  deferred    TODAY'S TREATMENT:     [facility] Adult PT Treatment:                                                DATE: 08/05/23 Therapeutic Exercise: Nustep L2 6 min Tennis ball used as weight: Supine press/protract 15x  Supine flexion 15x S/L ER 15x S/L abd 15x Prone flexion 15x Prone extension 15x Prone hor abd 15x Manual Therapy: PROM all planes 95d Abd, 70d IR, 45d ER, 140d FF Grade II mobs into distraction posterior and inferior directions 5x10    [facility] Adult PT Treatment:                                                DATE: 08/02/23 Therapeutic Exercise: Nustep L2  6 min (Tennis ball used as weight) Supine press/protract 15x  Supine flexion 15x S/L ER 15x S/L abd 15x Prone flexion 15x Prone extension 15x Prone hor abd 15x Manual Therapy: Skilled palpation to identify taught bands in R trapezius and teres major Trigger Point Dry Needling Treatment: Pre-treatment instruction: Patient instructed on dry needling rationale, procedures, and possible side effects including pain during treatment (achy,cramping feeling), bruising, drop of blood, lightheadedness, nausea, sweating. Patient Consent Given: Yes Education handout provided: No Muscles treated: R UT and teres major  Needle size and number: .25x60mm x 2 Electrical stimulation performed: No Parameters: N/A Treatment response/outcome: Twitch response elicited, Palpable decrease in muscle tension, and less discomfort with AROM Post-treatment instructions: Patient instructed to expect possible mild to moderate muscle soreness later today and/or tomorrow. Patient instructed in methods to reduce muscle soreness and to continue prescribed HEP. If patient was dry needled over the lung field, patient was instructed on signs and symptoms of pneumothorax and, however unlikely, to see immediate medical attention should they occur. Patient was also educated on signs and symptoms of infection and to seek medical attention should they occur. Patient verbalized understanding of these instructions and education.  [facility] Adult PT Treatment:                                                DATE: 07/28/23 Therapeutic Exercise: Nustep L2 6 min Seated scaption 10x B Supine PNF D1 F/E 15x Manual Therapy: PROM all planes 95d Abd, 70d IR, 45d ER, 140d FF Grade II mobs into distraction posterior and inferior directions 5x10  4 way scapula 15x ea.    PATIENT EDUCATION: Education details: HEP Person educated: Patient Education method: Explanation Education comprehension: verbalized understanding and needs further  education  HOME EXERCISE PROGRAM: Access Code: [email or web address]   ASSESSMENT: CLINICAL IMPRESSION:  ***  Today's session incorporated TPDN as noted above f/b aerobic work for increased circulation prior to initiating AROM.  Initially noted discomfort with AROM but symptoms lessened with repetitions.  Marked fatigue noted following AROM tasks.   OBJECTIVE IMPAIRMENTS: decreased activity tolerance, decreased knowledge of condition, decreased knowledge of use of DME, decreased mobility, decreased ROM, decreased strength, increased fascial restrictions, impaired perceived functional ability, increased muscle spasms, impaired UE functional use, postural dysfunction, and pain.   ACTIVITY LIMITATIONS: carrying, lifting, sleeping, bed mobility, bathing, toileting, dressing, reach over head, and hygiene/grooming  PARTICIPATION LIMITATIONS: meal prep, cleaning, laundry, medication management, driving, and community activity  PERSONAL FACTORS: Age, Fitness, Past/current experiences, and Time since onset of injury/illness/exacerbation are also affecting patient's functional outcome.    GOALS: Goals reviewed with patient? No  SHORT TERM GOALS: Target date: 08/11/2023   Patient to demonstrate independence in HEP  Baseline: [id number] Goal status: INITIAL  2.  Decrease worst pain to 6/10 Baseline: 10/10 Goal status: INITIAL  3.  Increase PROM R shoulder to Baseline:  PROM Right eval Left eval  Shoulder flexion    Shoulder extension    Shoulder abduction 85d   Shoulder adduction    Shoulder internal rotation 65d   Shoulder external rotation 32d   Elbow flexion WNL   Elbow extension WNL    Goal status: INITIAL  LONG TERM GOALS: Target date: 09/08/2023   Increase FOTO score to 55 Baseline: 28 Goal status: INITIAL  2.  Increase AROM R shoulder to 160d flexion and abduction Baseline: TBD at appropriate post-op interval Goal status: INITIAL  3.  Decrease worst pain to 4/10 Baseline:  10/10 Goal status: INITIAL  4.  Increase R shoulder strength to 4-/5 throughout Baseline: TBD at appropriate post-op interval Goal status: INITIAL    PLAN: PT FREQUENCY: 1-2x/week  PT DURATION: 8 weeks  PLANNED INTERVENTIONS: Therapeutic exercises, Therapeutic activity, Neuromuscular re-education, Balance training, Gait training, Patient/Family education, Self Care, Joint mobilization, Dry Needling, Electrical stimulation, Cryotherapy, Moist heat, Manual therapy, and Re-evaluation  PLAN FOR NEXT SESSION: HEP review and update, manual techniques as appropriate, aerobic tasks, ROM and flexibility activities, strengthening and PREs, TPDN, gait and balance training as needed     Hildred Laser PT 08/23/23  1:46 PM Phone: [phone] Fax: [phone]

## 2023-08-24 ENCOUNTER — Other Ambulatory Visit: Payer: Self-pay

## 2023-08-24 DIAGNOSIS — G8929 Other chronic pain: Secondary | ICD-10-CM

## 2023-08-24 NOTE — Telephone Encounter (Signed)
Yes for a month thx

## 2023-08-24 NOTE — Telephone Encounter (Signed)
Talked with patient to clarify pharmacy , patient stated that she received her Rx's.   Would like to know if her PT can be extended for a month?  CB# [phone].  Please advise.  Thank you

## 2023-08-24 NOTE — Telephone Encounter (Signed)
Completed.

## 2023-08-25 ENCOUNTER — Ambulatory Visit: Payer: Medicare PPO | Attending: Orthopedic Surgery

## 2023-08-25 DIAGNOSIS — M25511 Pain in right shoulder: Secondary | ICD-10-CM | POA: Diagnosis not present

## 2023-08-25 DIAGNOSIS — M12811 Other specific arthropathies, not elsewhere classified, right shoulder: Secondary | ICD-10-CM | POA: Diagnosis not present

## 2023-08-25 DIAGNOSIS — M6281 Muscle weakness (generalized): Secondary | ICD-10-CM | POA: Diagnosis not present

## 2023-08-26 ENCOUNTER — Encounter: Payer: Self-pay | Admitting: Family

## 2023-08-26 ENCOUNTER — Telehealth (INDEPENDENT_AMBULATORY_CARE_PROVIDER_SITE_OTHER): Payer: Medicare PPO | Admitting: Family

## 2023-08-26 ENCOUNTER — Telehealth: Payer: Medicare PPO | Admitting: Family

## 2023-08-26 VITALS — BP 154/64 | Ht 59.0 in | Wt 135.0 lb

## 2023-08-26 DIAGNOSIS — G4701 Insomnia due to medical condition: Secondary | ICD-10-CM

## 2023-08-26 DIAGNOSIS — E039 Hypothyroidism, unspecified: Secondary | ICD-10-CM | POA: Diagnosis not present

## 2023-08-26 DIAGNOSIS — I10 Essential (primary) hypertension: Secondary | ICD-10-CM

## 2023-08-26 DIAGNOSIS — N898 Other specified noninflammatory disorders of vagina: Secondary | ICD-10-CM

## 2023-08-26 MED ORDER — LEVOTHYROXINE SODIUM 88 MCG PO TABS: ORAL_TABLET | ORAL | 0 refills | Status: DC

## 2023-08-26 MED ORDER — FLUCONAZOLE 150 MG PO TABS: ORAL_TABLET | ORAL | 0 refills | Status: DC

## 2023-08-26 MED ORDER — ZOLPIDEM TARTRATE 10 MG PO TABS: 10.0000 mg | ORAL_TABLET | Freq: Every evening | ORAL | 0 refills | Status: DC | PRN

## 2023-08-26 MED ORDER — FLUTICASONE-SALMETEROL 250-50 MCG/ACT IN AEPB: 1.0000 | INHALATION_SPRAY | Freq: Two times a day (BID) | RESPIRATORY_TRACT | 3 refills | Status: DC

## 2023-08-26 MED ORDER — METRONIDAZOLE 500 MG PO TABS: 500.0000 mg | ORAL_TABLET | Freq: Two times a day (BID) | ORAL | 0 refills | Status: AC

## 2023-08-26 NOTE — Patient Instructions (Addendum)
Please let Dr. Jacinto Halim know about the blood pressure issues;   Call Dr. August Saucer about the bill related to the machine;  We will check on you on Monday- if the vaginal odor persists, we will need to get a urine culture.   Talk to the pharmacist about RSV vaccine due to underlying asthma/ high dose flu shot;

## 2023-08-26 NOTE — Progress Notes (Signed)
[name] [name] is a 72 y.o. female with the following history as recorded in Epic[employer]:  Patient Active Problem List   Diagnosis Date Noted   Complete tear of right rotator cuff 06/13/2023   Biceps tendonitis on right 06/13/2023   Synovitis of right shoulder 06/13/2023   Claudication in peripheral vascular disease (HCC) 08/26/2020   Neurogenic claudication 07/10/2020   Degenerative lumbar spinal stenosis 05/16/2020   Sacroiliac pain 08/10/2019   Anxiety and depression 05/11/2019   Chest pain 05/09/2019   Arthritis of sacroiliac joint of both sides 05/01/2019   Polyarthralgia 12/28/2018   Preventative health care 10/20/2018   RLS (restless legs syndrome) 09/27/2018   Insomnia disorder related to known organic factor 06/27/2018   PLMD (periodic limb movement disorder) 06/27/2018   Hot flashes due to menopause 04/25/2018   Palpitations 04/25/2018   Diaphoresis 04/25/2018   Long term current use of antithrombotics/antiplatelets 04/19/2018   Insomnia 04/19/2018   Intractable episodic headache 02/28/2018   Numbness and tingling of right arm 02/28/2018   Toe pain, right 10/19/2017   Rotator cuff arthropathy of left shoulder 06/02/2017   Trigger thumb of left hand 06/02/2017   Lipoma 05/06/2017   Pain of left thumb 05/06/2017   Chronic pain syndrome 12/15/2016   Lumbar degenerative disc disease 12/15/2016   Left leg pain 01/28/2016   Left shoulder pain 04/09/2015   Impaired glucose tolerance 07/30/2013   Abnormal breath sounds 07/30/2013   Asymptomatic bilateral carotid artery stenosis 05/05/2012   Anxiety 04/03/2011   CVA (cerebral infarction) 03/15/2011   HLD (hyperlipidemia) 11/03/2010   Hx of adenomatous colonic polyps 02/09/2008   Hypothyroidism 11/06/2007   ALLERGIC RHINITIS 11/06/2007   OSTEOPOROSIS 11/06/2007   ALCOHOL ABUSE, HX OF 11/06/2007   Essential hypertension 09/13/2007   Asthma 09/13/2007   PERIMENOPAUSAL STATUS 09/13/2007   Chronic insomnia 09/13/2007     Current Outpatient Medications  Medication Sig Dispense Refill   acitretin (SORIATANE) 25 MG capsule Take 25 mg by mouth daily.     Calcium Carbonate-Vit D-Min (CALCIUM 1200 PO) Take 1,200 mg by mouth daily.     Cholecalciferol (VITAMIN D) 50 MCG (2000 UT) tablet Take 2,000 Units by mouth daily.     citalopram (CELEXA) 20 MG tablet TAKE 1 TABLET BY MOUTH EVERY DAY 90 tablet 3   clopidogrel (PLAVIX) 75 MG tablet TAKE 1 TABLET BY MOUTH EVERY DAY 90 tablet 3   diltiazem (CARDIZEM CD) 180 MG 24 hr capsule TAKE 1 CAPSULE BY MOUTH EVERY DAY 90 capsule 3   ezetimibe (ZETIA) 10 MG tablet TAKE 1 TABLET BY MOUTH DAILY AFTER SUPPER 90 tablet 3   fluconazole (DIFLUCAN) 150 MG tablet Take 1 tablet today as directed; repeat after 72 hours 2 tablet 0   methocarbamol (ROBAXIN) 500 MG tablet Take 1 tablet (500 mg total) by mouth every 8 (eight) hours as needed. 30 tablet 2   metroNIDAZOLE (FLAGYL) 500 MG tablet Take 1 tablet (500 mg total) by mouth 2 (two) times daily for 5 days. 10 tablet 0   montelukast (SINGULAIR) 10 MG tablet TAKE 1 TABLET BY MOUTH EVERY DAY 90 tablet 3   Multiple Vitamin (MULTIVITAMIN WITH MINERALS) TABS tablet Take 1 tablet by mouth daily.     Omega-3 Fatty Acids (FISH OIL) 1000 MG CAPS Take 1,000 mg by mouth daily.     oxyCODONE (ROXICODONE) 5 MG immediate release tablet Take 1 tablet (5 mg total) by mouth every 8 (eight) hours as needed for severe pain. 30 tablet 0  pantoprazole (PROTONIX) 40 MG tablet Take 1 tablet (40 mg total) by mouth 2 (two) times daily. (Patient taking differently: Take 40 mg by mouth daily as needed (acid reflux).) 180 tablet 3   rOPINIRole (REQUIP) 0.5 MG tablet TAKE 1 TABLET BY MOUTH AT BEDTIME. 90 tablet 3   rosuvastatin (CRESTOR) 20 MG tablet TAKE 1 TABLET BY MOUTH EVERY DAY 90 tablet 3   fluticasone-salmeterol (ADVAIR DISKUS) 250-50 MCG/ACT AEPB Inhale 1 puff into the lungs in the morning and at bedtime. 60 each 3   levothyroxine (SYNTHROID) 88 MCG tablet  TAKE 1 TABLET BY MOUTH EVERY DAY BEFORE BREAKFAST 90 tablet 0   losartan (COZAAR) 25 MG tablet Take 1 tablet (25 mg total) by mouth every evening. 90 tablet 3   zolpidem (AMBIEN) 10 MG tablet Take 1 tablet (10 mg total) by mouth at bedtime as needed. for sleep 90 tablet 0   No current facility-administered medications for this visit.    Allergies: Atorvastatin, Benazepril, Lisinopril, Fosamax [alendronate sodium], Penicillins, and Trazodone and nefazodone  Past Medical History:  Diagnosis Date   Acid reflux    ALCOHOL ABUSE, HX OF 11/06/2007   ALLERGIC RHINITIS 11/06/2007   Anxiety 04/03/2011   ASTHMA 09/13/2007   Asthma    Chronic pain syndrome 12/15/2016   COLONIC POLYPS, HX OF 02/09/2008    ADENOMATOUS POLYP   Coronary artery disease    Encounter for well adult exam without abnormal findings 04/03/2011   HYPERLIPIDEMIA 11/03/2010   HYPERTENSION 09/13/2007   HYPOTHYROIDISM 11/06/2007   Impaired glucose tolerance 07/30/2013   INSOMNIA, HX OF 09/13/2007   Lumbar degenerative disc disease 12/15/2016   OSTEOPOROSIS 11/06/2007   PAD (peripheral artery disease) (HCC)    PERIMENOPAUSAL STATUS 09/13/2007   RLS (restless legs syndrome)    Stroke (HCC)    TIA (transient ischemic attack) 11/04/2011    Past Surgical History:  Procedure Laterality Date   ABDOMINAL AORTOGRAM W/LOWER EXTREMITY N/A 08/27/2020   Procedure: ABDOMINAL AORTOGRAM W/LOWER EXTREMITY;  Surgeon: Elder Negus, MD;  Location: [facility];  Service: Cardiovascular;  Laterality: N/A;   BREAST EXCISIONAL BIOPSY Left    BREAST SURGERY  2008 and 2012   x 2 - benign, left side   carotid artery surgery Left    CESAREAN SECTION     x 3   COLONOSCOPY  multiple   2010   PERIPHERAL VASCULAR BALLOON ANGIOPLASTY  08/27/2020   Procedure: PERIPHERAL VASCULAR BALLOON ANGIOPLASTY;  Surgeon: Elder Negus, MD;  Location: [facility];  Service: Cardiovascular;;  Right SFA scoring balloon   SHOULDER  ARTHROSCOPY WITH SUBACROMIAL DECOMPRESSION, ROTATOR CUFF REPAIR AND BICEP TENDON REPAIR Right 06/08/2023   Procedure: RIGHT SHOULDER ARTHROSCOPY, DEBRIDEMENT, MINI OPEN ROTATOR CUFF TEAR REPAIR AND BICEPS TENODESIS;  Surgeon: Cammy Copa, MD;  Location: [facility];  Service: Orthopedics;  Laterality: Right;   TRANSFORAMINAL LUMBAR INTERBODY FUSION (TLIF) WITH PEDICLE SCREW FIXATION 1 LEVEL Left 07/10/2020   Procedure: LEFT-SIDED LUMBAR FOUR-FIVE TRANSFORAMINAL LUMBAR INTERBODY FUSION WITH INSTRUMENTATION AND ALLOGRAFT;  Surgeon: Estill Bamberg, MD;  Location: [facility];  Service: Orthopedics;  Laterality: Left;    Family History  Problem Relation Age of Onset   Hypertension Mother 50   Heart disease Mother    Heart failure Mother 52   Hypertension Brother    Colon cancer Brother 65   Dementia Maternal Grandmother    Breast cancer Cousin    Hyperlipidemia Other    Hypertension Other    Coronary artery  disease Other     Social History   Tobacco Use   Smoking status: Former    Current packs/day: 0.00    Types: Cigarettes    Quit date: 05/31/1979    Years since quitting: 44.2   Smokeless tobacco: Current    Types: Snuff   Tobacco comments:    social smoker  Substance Use Topics   Alcohol use: Not Currently    Subjective:    I connected with Virgina B Candee on 08/26/23 at  9:00 AM EDT by a video enabled telemedicine application and verified that I am speaking with the correct person using two identifiers.   I discussed the limitations of evaluation and management by telemedicine and the availability of in person appointments. The patient expressed understanding and agreed to proceed. Provider in office/ patient is at home; provider and patient are only 2 people on video call.    6 month follow up on chronic care needs- unable to drive at this time;  Has had right shoulder surgery since I last saw her- still in PT;  Had all her teeth pulled in March- in October will be getting  permanent dentures;  Concerned about strong vaginal smell x 1-2 months- no vaginal discharge; no urinary symptoms- no urgency, frequency; does not have a way to get to the office to drop off urine sample  Objective:  Vitals:   08/26/23 0855 08/26/23 0951  BP: (!) 154/64 (!) 154/64  Weight: 135 lb (61.2 kg)   Height: 4\' 11"  (1.499 m)     General: Well developed, well nourished, in no acute distress  Skin : Warm and dry.  Head: Normocephalic and atraumatic  Lungs: Respirations unlabored;  Neurologic: Alert and oriented; speech intact; face symmetrical; moves all extremities well; CNII-XII intact without focal deficit   Assessment:  1. Acquired hypothyroidism   2. Essential hypertension   3. Insomnia due to medical condition   4. Vaginal itching     Plan:  Refills updated as requested; Patient is unable to drive and has no way to get to office to drop off urine sample; will try treating for yeast/ BV and follow up next week; if still having symptoms, she will ask one of her children to bring her for urine sample drop off; follow up to be determined;  Reviewed vaccine recommendations- she will get flu and ask about RSV; She will let her cardiologist know about her blood pressure fluctuations.   No follow-ups on file.  No orders of the defined types were placed in this encounter.   Requested Prescriptions   Signed Prescriptions Disp Refills   zolpidem (AMBIEN) 10 MG tablet 90 tablet 0    Sig: Take 1 tablet (10 mg total) by mouth at bedtime as needed. for sleep   fluticasone-salmeterol (ADVAIR DISKUS) 250-50 MCG/ACT AEPB 60 each 3    Sig: Inhale 1 puff into the lungs in the morning and at bedtime.   levothyroxine (SYNTHROID) 88 MCG tablet 90 tablet 0    Sig: TAKE 1 TABLET BY MOUTH EVERY DAY BEFORE BREAKFAST   fluconazole (DIFLUCAN) 150 MG tablet 2 tablet 0    Sig: Take 1 tablet today as directed; repeat after 72 hours   metroNIDAZOLE (FLAGYL) 500 MG tablet 10 tablet 0    Sig:  Take 1 tablet (500 mg total) by mouth 2 (two) times daily for 5 days.

## 2023-08-26 NOTE — Therapy (Signed)
OUTPATIENT PHYSICAL THERAPY SHOULDER EVALUATION   Patient Name: [name] [name] MRN: [id number] DOB:February 15, 1951, 72 y.o., female Today's Date: 08/27/2023  END OF SESSION:  PT End of Session - 08/27/23 1102     Visit Number 8    Number of Visits 16    Date for PT Re-Evaluation 09/13/23    Authorization Type [employer]    Authorization Time Period 07/19/2023 - 09/07/2023    Authorization - Number of Visits 12    PT Start Time 1100   late for session   PT Stop Time 1130    PT Time Calculation (min) 30 min    Activity Tolerance Patient tolerated treatment well    Behavior During Therapy [facility] for tasks assessed/performed                   Past Medical History:  Diagnosis Date   Acid reflux    ALCOHOL ABUSE, HX OF 11/06/2007   ALLERGIC RHINITIS 11/06/2007   Anxiety 04/03/2011   ASTHMA 09/13/2007   Asthma    Chronic pain syndrome 12/15/2016   COLONIC POLYPS, HX OF 02/09/2008    ADENOMATOUS POLYP   Coronary artery disease    Encounter for well adult exam without abnormal findings 04/03/2011   HYPERLIPIDEMIA 11/03/2010   HYPERTENSION 09/13/2007   HYPOTHYROIDISM 11/06/2007   Impaired glucose tolerance 07/30/2013   INSOMNIA, HX OF 09/13/2007   Lumbar degenerative disc disease 12/15/2016   OSTEOPOROSIS 11/06/2007   PAD (peripheral artery disease) (HCC)    PERIMENOPAUSAL STATUS 09/13/2007   RLS (restless legs syndrome)    Stroke (HCC)    TIA (transient ischemic attack) 11/04/2011   Past Surgical History:  Procedure Laterality Date   ABDOMINAL AORTOGRAM W/LOWER EXTREMITY N/A 08/27/2020   Procedure: ABDOMINAL AORTOGRAM W/LOWER EXTREMITY;  Surgeon: Elder Negus, MD;  Location: [facility];  Service: Cardiovascular;  Laterality: N/A;   BREAST EXCISIONAL BIOPSY Left    BREAST SURGERY  2008 and 2012   x 2 - benign, left side   carotid artery surgery Left    CESAREAN SECTION     x 3   COLONOSCOPY  multiple   2010   PERIPHERAL VASCULAR BALLOON  ANGIOPLASTY  08/27/2020   Procedure: PERIPHERAL VASCULAR BALLOON ANGIOPLASTY;  Surgeon: Elder Negus, MD;  Location: [facility];  Service: Cardiovascular;;  Right SFA scoring balloon   SHOULDER ARTHROSCOPY WITH SUBACROMIAL DECOMPRESSION, ROTATOR CUFF REPAIR AND BICEP TENDON REPAIR Right 06/08/2023   Procedure: RIGHT SHOULDER ARTHROSCOPY, DEBRIDEMENT, MINI OPEN ROTATOR CUFF TEAR REPAIR AND BICEPS TENODESIS;  Surgeon: Cammy Copa, MD;  Location: [facility] OR;  Service: Orthopedics;  Laterality: Right;   TRANSFORAMINAL LUMBAR INTERBODY FUSION (TLIF) WITH PEDICLE SCREW FIXATION 1 LEVEL Left 07/10/2020   Procedure: LEFT-SIDED LUMBAR FOUR-FIVE TRANSFORAMINAL LUMBAR INTERBODY FUSION WITH INSTRUMENTATION AND ALLOGRAFT;  Surgeon: Estill Bamberg, MD;  Location: [facility] OR;  Service: Orthopedics;  Laterality: Left;   Patient Active Problem List   Diagnosis Date Noted   Complete tear of right rotator cuff 06/13/2023   Biceps tendonitis on right 06/13/2023   Synovitis of right shoulder 06/13/2023   Claudication in peripheral vascular disease (HCC) 08/26/2020   Neurogenic claudication 07/10/2020   Degenerative lumbar spinal stenosis 05/16/2020   Sacroiliac pain 08/10/2019   Anxiety and depression 05/11/2019   Chest pain 05/09/2019   Arthritis of sacroiliac joint of both sides 05/01/2019   Polyarthralgia 12/28/2018   Preventative health care 10/20/2018   RLS (restless legs syndrome) 09/27/2018   Insomnia  disorder related to known organic factor 06/27/2018   PLMD (periodic limb movement disorder) 06/27/2018   Hot flashes due to menopause 04/25/2018   Palpitations 04/25/2018   Diaphoresis 04/25/2018   Long term current use of antithrombotics/antiplatelets 04/19/2018   Insomnia 04/19/2018   Intractable episodic headache 02/28/2018   Numbness and tingling of right arm 02/28/2018   Toe pain, right 10/19/2017   Rotator cuff arthropathy of left shoulder 06/02/2017   Trigger thumb of left hand  06/02/2017   Lipoma 05/06/2017   Pain of left thumb 05/06/2017   Chronic pain syndrome 12/15/2016   Lumbar degenerative disc disease 12/15/2016   Left leg pain 01/28/2016   Left shoulder pain 04/09/2015   Impaired glucose tolerance 07/30/2013   Abnormal breath sounds 07/30/2013   Asymptomatic bilateral carotid artery stenosis 05/05/2012   Anxiety 04/03/2011   CVA (cerebral infarction) 03/15/2011   HLD (hyperlipidemia) 11/03/2010   Hx of adenomatous colonic polyps 02/09/2008   Hypothyroidism 11/06/2007   ALLERGIC RHINITIS 11/06/2007   OSTEOPOROSIS 11/06/2007   ALCOHOL ABUSE, HX OF 11/06/2007   Essential hypertension 09/13/2007   Asthma 09/13/2007   PERIMENOPAUSAL STATUS 09/13/2007   Chronic insomnia 09/13/2007    PCP: [name], FNP   REFERRING PROVIDER: Cammy Copa, MD  REFERRING DIAG: M75.101 (ICD-10-CM) - Tear of right supraspinatus tendon  THERAPY DIAG:  Muscle weakness (generalized)  Rotator cuff arthropathy of right shoulder  Acute pain of right shoulder  Rationale for Evaluation and Treatment: Rehabilitation  ONSET DATE: 06/08/23 surgery  SUBJECTIVE:                                                                                                                                                                                      SUBJECTIVE STATEMENT: Mild increased soreness as she felt she overdid it with housework yesterday.  Close to baseline soreness today.  Hand dominance: Right  PERTINENT HISTORY: Plan: [name] [name] is a patient is about 4 weeks out right shoulder arthroscopy with rotator cuff tear repair using a 2 x 2 construct.  Biceps tenodesis also performed.  Patient did use the brace but she feels like is not helping her.  Having a lot of pain.  Taking oxycodone and muscle relaxer.  On examination she is somewhat predictably stiff.  Rotator cuff feels good with passive range of motion.  She has about 20 degrees of external rotation 70  degrees of abduction and about 80 of forward flexion.  Plan at this time is to discontinue sling.  No lifting with the right arm.  She needs therapy about 2-3 times a week for the next 4 weeks to work primarily on range of motion  but it is okay to start strengthening only at the 6-week postop mark which for her would be in 2 weeks.  Come back in 4 weeks for clinical recheck.  Encouraged her to get an overhead pulley so she can work on stretching it out a little bit more for overhead flexion.  PAIN:  Are you having pain? Yes: NPRS scale: 10/10 Pain location: R shoulder Pain description: ache  Aggravating factors: AROM Relieving factors: meds and rests  PRECAUTIONS: Other: post-op RCR R 06/08/23  RED FLAGS: None   WEIGHT BEARING RESTRICTIONS: No  FALLS:  Has patient fallen in last 6 months? No   OCCUPATION: retired  PLOF: Independent  PATIENT GOALS: To regain the use of my shoulder  NEXT MD VISIT: 08/04/23   OBJECTIVE:  PATIENT SURVEYS:  FOTO 28(55 predicted)  POSTURE: Elevated R shoulder  UPPER EXTREMITY ROM:   PROM Right eval Left eval 08/25/23 R A/PROM  Shoulder flexion 95d  145/  Shoulder extension     Shoulder abduction 85d  135  Shoulder adduction     Shoulder internal rotation 65d  70d  Shoulder external rotation 32d  65d  Elbow flexion WNL    Elbow extension WNL    Wrist flexion     Wrist extension     Wrist ulnar deviation     Wrist radial deviation     Wrist pronation     Wrist supination     (Blank rows = not tested)  UPPER EXTREMITY MMT: Deferred due to post-op status  MMT Right eval Left eval R 08/25/23  Shoulder flexion   3+  Shoulder extension   3+  Shoulder abduction   3+  Shoulder adduction     Shoulder internal rotation   4-  Shoulder external rotation   4-  Middle trapezius     Lower trapezius     Elbow flexion     Elbow extension     Wrist flexion     Wrist extension     Wrist ulnar deviation     Wrist radial deviation      Wrist pronation     Wrist supination     Grip strength (lbs)     (Blank rows = not tested)  SHOULDER SPECIAL TESTS: deferred  JOINT MOBILITY TESTING:  deferred  PALPATION:  deferred    TODAY'S TREATMENT:     [facility] Adult PT Treatment:                                                DATE: 08/27/23 Therapeutic Exercise: Nustep L4 6 min Scaption on wall 15x Scapular wall slides 15x pillow case PNF D1 F/E 15x  Manual Therapy: Grade III mobs into distraction posterior and inferior directions 5x10 ea 4 way scapula 15x ea. R pec minor release 2 min   [facility] Adult PT Treatment:                                                DATE: 08/25/23 Therapeutic Exercise: Nustep L2 6 min Supine press/protract 15x 500g Supine flexion 15x 500g S/L ER 15x 500g S/L abd 15x 500g Prone flexion 15x 500g Prone extension 15x 500g Prone hor abd 15x 500g Prone row 500g 15x  Seated hor abd YTB 15x Seated ER YTB 15x Manual Therapy: PROM/PNF D1 F/E 15x Grade III mobs into distraction posterior and inferior directions 5x10 ea STM R pec minor   [facility] Adult PT Treatment:                                                DATE: 08/05/23 Therapeutic Exercise: Nustep L2 6 min Tennis ball used as weight: Supine press/protract 15x  Supine flexion 15x S/L ER 15x S/L abd 15x Prone flexion 15x Prone extension 15x Prone hor abd 15x Manual Therapy: PROM all planes 95d Abd, 70d IR, 45d ER, 140d FF Grade II mobs into distraction posterior and inferior directions 5x10    [facility] Adult PT Treatment:                                                DATE: 08/02/23 Therapeutic Exercise: Nustep L2 6 min (Tennis ball used as weight) Supine press/protract 15x  Supine flexion 15x S/L ER 15x S/L abd 15x Prone flexion 15x Prone extension 15x Prone hor abd 15x Manual Therapy: Skilled palpation to identify taught bands in R trapezius and teres major Trigger Point Dry Needling Treatment: Pre-treatment instruction:  Patient instructed on dry needling rationale, procedures, and possible side effects including pain during treatment (achy,cramping feeling), bruising, drop of blood, lightheadedness, nausea, sweating. Patient Consent Given: Yes Education handout provided: No Muscles treated: R UT and teres major  Needle size and number: .25x73mm x 2 Electrical stimulation performed: No Parameters: N/A Treatment response/outcome: Twitch response elicited, Palpable decrease in muscle tension, and less discomfort with AROM Post-treatment instructions: Patient instructed to expect possible mild to moderate muscle soreness later today and/or tomorrow. Patient instructed in methods to reduce muscle soreness and to continue prescribed HEP. If patient was dry needled over the lung field, patient was instructed on signs and symptoms of pneumothorax and, however unlikely, to see immediate medical attention should they occur. Patient was also educated on signs and symptoms of infection and to seek medical attention should they occur. Patient verbalized understanding of these instructions and education.    [facility] Adult PT Treatment:                                                DATE: 07/28/23 Therapeutic Exercise: Nustep L2 6 min Seated scaption 10x B Supine PNF D1 F/E 15x Manual Therapy: PROM all planes 95d Abd, 70d IR, 45d ER, 140d FF Grade II mobs into distraction posterior and inferior directions 5x10  4 way scapula 15x ea.    PATIENT EDUCATION: Education details: HEP Person educated: Patient Education method: Explanation Education comprehension: verbalized understanding and needs further education  HOME EXERCISE PROGRAM: Access Code: [email or web address] URL: [email or web address] Date: 08/25/2023 Prepared by: Gustavus Bryant  Exercises - Seated Shoulder Horizontal Abduction with Resistance  - 2 x daily - 5 x weekly - 2 sets - 15 reps - Shoulder External Rotation and Scapular Retraction with Resistance   - 2 x daily - 5 x weekly - 2 sets - 15 reps - Scaption Wall Slide with Towel  -  2 x daily - 5 x weekly - 2 sets - 15 reps   ASSESSMENT: CLINICAL IMPRESSION: Session abbreviated as patient arrived late.  Continued with manual techniques to increase mobility and modulate pain levels.  Added additional CKC tasks to promote functional mobility and motor patterns  Today's session incorporated TPDN as noted above f/b aerobic work for increased circulation prior to initiating AROM.  Initially noted discomfort with AROM but symptoms lessened with repetitions.  Marked fatigue noted following AROM tasks.   OBJECTIVE IMPAIRMENTS: decreased activity tolerance, decreased knowledge of condition, decreased knowledge of use of DME, decreased mobility, decreased ROM, decreased strength, increased fascial restrictions, impaired perceived functional ability, increased muscle spasms, impaired UE functional use, postural dysfunction, and pain.   ACTIVITY LIMITATIONS: carrying, lifting, sleeping, bed mobility, bathing, toileting, dressing, reach over head, and hygiene/grooming  PARTICIPATION LIMITATIONS: meal prep, cleaning, laundry, medication management, driving, and community activity  PERSONAL FACTORS: Age, Fitness, Past/current experiences, and Time since onset of injury/illness/exacerbation are also affecting patient's functional outcome.    GOALS: Goals reviewed with patient? No  SHORT TERM GOALS: Target date: 08/11/2023   Patient to demonstrate independence in HEP  Baseline: [id number] Goal status: Met  2.  Decrease worst pain to 6/10 Baseline: 10/10; 08/25/23 10 at worst but minimal resting Goal status: Ongoing  3.  Increase PROM R shoulder to 125d flexion/abd Baseline:  PROM Right eval Left eval 08/25/23 R A/PROM  Shoulder flexion 95d  145/  Shoulder extension     Shoulder abduction 85d  135   Goal status: Met  LONG TERM GOALS: Target date: 09/08/2023   Increase FOTO score to  55 Baseline: 28 Goal status: INITIAL  2.  Increase AROM R shoulder to 160d flexion and abduction Baseline: TBD at appropriate post-op interval Goal status: INITIAL  3.  Decrease worst pain to 4/10 Baseline: 10/10 Goal status: INITIAL  4.  Increase R shoulder strength to 4-/5 throughout Baseline: TBD at appropriate post-op interval Goal status: INITIAL    PLAN: PT FREQUENCY: 1-2x/week  PT DURATION: 8 weeks  PLANNED INTERVENTIONS: Therapeutic exercises, Therapeutic activity, Neuromuscular re-education, Balance training, Gait training, Patient/Family education, Self Care, Joint mobilization, Dry Needling, Electrical stimulation, Cryotherapy, Moist heat, Manual therapy, and Re-evaluation  PLAN FOR NEXT SESSION: HEP review and update, manual techniques as appropriate, aerobic tasks, ROM and flexibility activities, strengthening and PREs, TPDN, gait and balance training as needed     Hildred Laser PT 08/27/23  11:53 AM Phone: [phone] Fax: [phone]

## 2023-08-27 ENCOUNTER — Other Ambulatory Visit: Payer: Self-pay | Admitting: Cardiology

## 2023-08-27 ENCOUNTER — Ambulatory Visit: Payer: Medicare PPO

## 2023-08-27 DIAGNOSIS — M6281 Muscle weakness (generalized): Secondary | ICD-10-CM | POA: Diagnosis not present

## 2023-08-27 DIAGNOSIS — M12811 Other specific arthropathies, not elsewhere classified, right shoulder: Secondary | ICD-10-CM | POA: Diagnosis not present

## 2023-08-27 DIAGNOSIS — M25511 Pain in right shoulder: Secondary | ICD-10-CM

## 2023-08-27 DIAGNOSIS — I739 Peripheral vascular disease, unspecified: Secondary | ICD-10-CM

## 2023-08-29 NOTE — Therapy (Unsigned)
OUTPATIENT PHYSICAL THERAPY SHOULDER EVALUATION   Patient Name: [name] [name] Older MRN: [id number] DOB:July 24, 1951, 72 y.o., female Today's Date: 08/30/2023  END OF SESSION:  PT End of Session - 08/30/23 1048     Visit Number 9    Number of Visits 16    Date for PT Re-Evaluation 09/13/23    Authorization Type [employer]    Authorization Time Period 07/19/2023 - 09/07/2023    Authorization - Number of Visits 12    PT Start Time 1048    PT Stop Time 1130    PT Time Calculation (min) 42 min    Activity Tolerance Patient tolerated treatment well    Behavior During Therapy WFL for tasks assessed/performed                    Past Medical History:  Diagnosis Date   Acid reflux    ALCOHOL ABUSE, HX OF 11/06/2007   ALLERGIC RHINITIS 11/06/2007   Anxiety 04/03/2011   ASTHMA 09/13/2007   Asthma    Chronic pain syndrome 12/15/2016   COLONIC POLYPS, HX OF 02/09/2008    ADENOMATOUS POLYP   Coronary artery disease    Encounter for well adult exam without abnormal findings 04/03/2011   HYPERLIPIDEMIA 11/03/2010   HYPERTENSION 09/13/2007   HYPOTHYROIDISM 11/06/2007   Impaired glucose tolerance 07/30/2013   INSOMNIA, HX OF 09/13/2007   Lumbar degenerative disc disease 12/15/2016   OSTEOPOROSIS 11/06/2007   PAD (peripheral artery disease) (HCC)    PERIMENOPAUSAL STATUS 09/13/2007   RLS (restless legs syndrome)    Stroke (HCC)    TIA (transient ischemic attack) 11/04/2011   Past Surgical History:  Procedure Laterality Date   ABDOMINAL AORTOGRAM W/LOWER EXTREMITY N/A 08/27/2020   Procedure: ABDOMINAL AORTOGRAM W/LOWER EXTREMITY;  Surgeon: Elder Negus, MD;  Location: [facility];  Service: Cardiovascular;  Laterality: N/A;   BREAST EXCISIONAL BIOPSY Left    BREAST SURGERY  2008 and 2012   x 2 - benign, left side   carotid artery surgery Left    CESAREAN SECTION     x 3   COLONOSCOPY  multiple   2010   PERIPHERAL VASCULAR BALLOON ANGIOPLASTY  08/27/2020    Procedure: PERIPHERAL VASCULAR BALLOON ANGIOPLASTY;  Surgeon: Elder Negus, MD;  Location: [facility];  Service: Cardiovascular;;  Right SFA scoring balloon   SHOULDER ARTHROSCOPY WITH SUBACROMIAL DECOMPRESSION, ROTATOR CUFF REPAIR AND BICEP TENDON REPAIR Right 06/08/2023   Procedure: RIGHT SHOULDER ARTHROSCOPY, DEBRIDEMENT, MINI OPEN ROTATOR CUFF TEAR REPAIR AND BICEPS TENODESIS;  Surgeon: Cammy Copa, MD;  Location: [facility];  Service: Orthopedics;  Laterality: Right;   TRANSFORAMINAL LUMBAR INTERBODY FUSION (TLIF) WITH PEDICLE SCREW FIXATION 1 LEVEL Left 07/10/2020   Procedure: LEFT-SIDED LUMBAR FOUR-FIVE TRANSFORAMINAL LUMBAR INTERBODY FUSION WITH INSTRUMENTATION AND ALLOGRAFT;  Surgeon: Estill Bamberg, MD;  Location: [facility];  Service: Orthopedics;  Laterality: Left;   Patient Active Problem List   Diagnosis Date Noted   Complete tear of right rotator cuff 06/13/2023   Biceps tendonitis on right 06/13/2023   Synovitis of right shoulder 06/13/2023   Claudication in peripheral vascular disease (HCC) 08/26/2020   Neurogenic claudication 07/10/2020   Degenerative lumbar spinal stenosis 05/16/2020   Sacroiliac pain 08/10/2019   Anxiety and depression 05/11/2019   Chest pain 05/09/2019   Arthritis of sacroiliac joint of both sides 05/01/2019   Polyarthralgia 12/28/2018   Preventative health care 10/20/2018   RLS (restless legs syndrome) 09/27/2018   Insomnia disorder related to  known organic factor 06/27/2018   PLMD (periodic limb movement disorder) 06/27/2018   Hot flashes due to menopause 04/25/2018   Palpitations 04/25/2018   Diaphoresis 04/25/2018   Long term current use of antithrombotics/antiplatelets 04/19/2018   Insomnia 04/19/2018   Intractable episodic headache 02/28/2018   Numbness and tingling of right arm 02/28/2018   Toe pain, right 10/19/2017   Rotator cuff arthropathy of left shoulder 06/02/2017   Trigger thumb of left hand 06/02/2017   Lipoma  05/06/2017   Pain of left thumb 05/06/2017   Chronic pain syndrome 12/15/2016   Lumbar degenerative disc disease 12/15/2016   Left leg pain 01/28/2016   Left shoulder pain 04/09/2015   Impaired glucose tolerance 07/30/2013   Abnormal breath sounds 07/30/2013   Asymptomatic bilateral carotid artery stenosis 05/05/2012   Anxiety 04/03/2011   CVA (cerebral infarction) 03/15/2011   HLD (hyperlipidemia) 11/03/2010   Hx of adenomatous colonic polyps 02/09/2008   Hypothyroidism 11/06/2007   ALLERGIC RHINITIS 11/06/2007   OSTEOPOROSIS 11/06/2007   ALCOHOL ABUSE, HX OF 11/06/2007   Essential hypertension 09/13/2007   Asthma 09/13/2007   PERIMENOPAUSAL STATUS 09/13/2007   Chronic insomnia 09/13/2007    PCP: [name], FNP   REFERRING PROVIDER: Cammy Copa, MD  REFERRING DIAG: M75.101 (ICD-10-CM) - Tear of right supraspinatus tendon  THERAPY DIAG:  Muscle weakness (generalized)  Rotator cuff arthropathy of right shoulder  Acute pain of right shoulder  Rationale for Evaluation and Treatment: Rehabilitation  ONSET DATE: 06/08/23 surgery  SUBJECTIVE:                                                                                                                                                                                      SUBJECTIVE STATEMENT: Ran out of Ambien this weekend and had difficulty sleeping.  No adverse effect on R shoulder symptoms however.  Hand dominance: Right  PERTINENT HISTORY: Plan: [name] is a patient is about 4 weeks out right shoulder arthroscopy with rotator cuff tear repair using a 2 x 2 construct.  Biceps tenodesis also performed.  Patient did use the brace but she feels like is not helping her.  Having a lot of pain.  Taking oxycodone and muscle relaxer.  On examination she is somewhat predictably stiff.  Rotator cuff feels good with passive range of motion.  She has about 20 degrees of external rotation 70 degrees of abduction and  about 80 of forward flexion.  Plan at this time is to discontinue sling.  No lifting with the right arm.  She needs therapy about 2-3 times a week for the next 4 weeks to work primarily on range of motion but it  is okay to start strengthening only at the 6-week postop mark which for her would be in 2 weeks.  Come back in 4 weeks for clinical recheck.  Encouraged her to get an overhead pulley so she can work on stretching it out a little bit more for overhead flexion.  PAIN:  Are you having pain? Yes: NPRS scale: 10/10 Pain location: R shoulder Pain description: ache  Aggravating factors: AROM Relieving factors: meds and rests  PRECAUTIONS: Other: post-op RCR R 06/08/23  RED FLAGS: None   WEIGHT BEARING RESTRICTIONS: No  FALLS:  Has patient fallen in last 6 months? No   OCCUPATION: retired  PLOF: Independent  PATIENT GOALS: To regain the use of my shoulder  NEXT MD VISIT: 08/04/23   OBJECTIVE:  PATIENT SURVEYS:  FOTO 28(55 predicted)  POSTURE: Elevated R shoulder  UPPER EXTREMITY ROM:   PROM Right eval Left eval 08/25/23 R A/PROM  Shoulder flexion 95d  145/  Shoulder extension     Shoulder abduction 85d  135  Shoulder adduction     Shoulder internal rotation 65d  70d  Shoulder external rotation 32d  65d  Elbow flexion WNL    Elbow extension WNL    Wrist flexion     Wrist extension     Wrist ulnar deviation     Wrist radial deviation     Wrist pronation     Wrist supination     (Blank rows = not tested)  UPPER EXTREMITY MMT: Deferred due to post-op status  MMT Right eval Left eval R 08/25/23  Shoulder flexion   3+  Shoulder extension   3+  Shoulder abduction   3+  Shoulder adduction     Shoulder internal rotation   4-  Shoulder external rotation   4-  Middle trapezius     Lower trapezius     Elbow flexion     Elbow extension     Wrist flexion     Wrist extension     Wrist ulnar deviation     Wrist radial deviation     Wrist pronation      Wrist supination     Grip strength (lbs)     (Blank rows = not tested)  SHOULDER SPECIAL TESTS: deferred  JOINT MOBILITY TESTING:  deferred  PALPATION:  deferred    TODAY'S TREATMENT:     [facility] Adult PT Treatment:                                                DATE: 08/30/23 Therapeutic Exercise: Nustep L4 6 min Supine flexion B holding ball 15x Scaption on wall 15x 1# Scapular wall slides 15x YTB PNF D1 F/E 15x Manual Therapy: Grade III mobs into distraction posterior and inferior directions 5x10 ea 4 way scapula 15x ea. R pec major release 2 min  [facility] Adult PT Treatment:                                                DATE: 08/27/23 Therapeutic Exercise: Nustep L4 6 min Scaption on wall 15x Scapular wall slides 15x pillow case PNF D1 F/E 15x  Manual Therapy: Grade III mobs into distraction posterior and inferior directions 5x10 ea 4 way scapula  15x ea. R pec minor release 2 min   [facility] Adult PT Treatment:                                                DATE: 08/25/23 Therapeutic Exercise: Nustep L2 6 min Supine press/protract 15x 500g Supine flexion 15x 500g S/L ER 15x 500g S/L abd 15x 500g Prone flexion 15x 500g Prone extension 15x 500g Prone hor abd 15x 500g Prone row 500g 15x Seated hor abd YTB 15x Seated ER YTB 15x Manual Therapy: PROM/PNF D1 F/E 15x Grade III mobs into distraction posterior and inferior directions 5x10 ea STM R pec minor   [facility] Adult PT Treatment:                                                DATE: 08/05/23 Therapeutic Exercise: Nustep L2 6 min Tennis ball used as weight: Supine press/protract 15x  Supine flexion 15x S/L ER 15x S/L abd 15x Prone flexion 15x Prone extension 15x Prone hor abd 15x Manual Therapy: PROM all planes 95d Abd, 70d IR, 45d ER, 140d FF Grade II mobs into distraction posterior and inferior directions 5x10    [facility] Adult PT Treatment:                                                DATE:  08/02/23 Therapeutic Exercise: Nustep L2 6 min (Tennis ball used as weight) Supine press/protract 15x  Supine flexion 15x S/L ER 15x S/L abd 15x Prone flexion 15x Prone extension 15x Prone hor abd 15x Manual Therapy: Skilled palpation to identify taught bands in R trapezius and teres major Trigger Point Dry Needling Treatment: Pre-treatment instruction: Patient instructed on dry needling rationale, procedures, and possible side effects including pain during treatment (achy,cramping feeling), bruising, drop of blood, lightheadedness, nausea, sweating. Patient Consent Given: Yes Education handout provided: No Muscles treated: R UT and teres major  Needle size and number: .25x50mm x 2 Electrical stimulation performed: No Parameters: N/A Treatment response/outcome: Twitch response elicited, Palpable decrease in muscle tension, and less discomfort with AROM Post-treatment instructions: Patient instructed to expect possible mild to moderate muscle soreness later today and/or tomorrow. Patient instructed in methods to reduce muscle soreness and to continue prescribed HEP. If patient was dry needled over the lung field, patient was instructed on signs and symptoms of pneumothorax and, however unlikely, to see immediate medical attention should they occur. Patient was also educated on signs and symptoms of infection and to seek medical attention should they occur. Patient verbalized understanding of these instructions and education.    [facility] Adult PT Treatment:                                                DATE: 07/28/23 Therapeutic Exercise: Nustep L2 6 min Seated scaption 10x B Supine PNF D1 F/E 15x Manual Therapy: PROM all planes 95d Abd, 70d IR, 45d ER, 140d FF Grade II mobs into distraction posterior and inferior  directions 5x10  4 way scapula 15x ea.    PATIENT EDUCATION: Education details: HEP Person educated: Patient Education method: Explanation Education comprehension:  verbalized understanding and needs further education  HOME EXERCISE PROGRAM: Access Code: [email or web address] URL: [email or web address] Date: 08/25/2023 Prepared by: Gustavus Bryant  Exercises - Seated Shoulder Horizontal Abduction with Resistance  - 2 x daily - 5 x weekly - 2 sets - 15 reps - Shoulder External Rotation and Scapular Retraction with Resistance  - 2 x daily - 5 x weekly - 2 sets - 15 reps - Scaption Wall Slide with Towel  - 2 x daily - 5 x weekly - 2 sets - 15 reps   ASSESSMENT: CLINICAL IMPRESSION: Overall decreased pain and improved function.  Advanced difficulty and resistance as noted.  Continued manual techniques to reduce soft tissue restrictions and promote mobility and function.  Continues to struggle with soft tissue restrictions and TrPs.  Cued not to overexert and maintain proper form and pacing  Today's session incorporated TPDN as noted above f/b aerobic work for increased circulation prior to initiating AROM.  Initially noted discomfort with AROM but symptoms lessened with repetitions.  Marked fatigue noted following AROM tasks.   OBJECTIVE IMPAIRMENTS: decreased activity tolerance, decreased knowledge of condition, decreased knowledge of use of DME, decreased mobility, decreased ROM, decreased strength, increased fascial restrictions, impaired perceived functional ability, increased muscle spasms, impaired UE functional use, postural dysfunction, and pain.   ACTIVITY LIMITATIONS: carrying, lifting, sleeping, bed mobility, bathing, toileting, dressing, reach over head, and hygiene/grooming  PARTICIPATION LIMITATIONS: meal prep, cleaning, laundry, medication management, driving, and community activity  PERSONAL FACTORS: Age, Fitness, Past/current experiences, and Time since onset of injury/illness/exacerbation are also affecting patient's functional outcome.    GOALS: Goals reviewed with patient? No  SHORT TERM GOALS: Target date: 08/11/2023   Patient  to demonstrate independence in HEP  Baseline: [email or web address] Goal status: Met  2.  Decrease worst pain to 6/10 Baseline: 10/10; 08/25/23 10 at worst but minimal resting Goal status: Ongoing  3.  Increase PROM R shoulder to 125d flexion/abd Baseline:  PROM Right eval Left eval 08/25/23 R A/PROM  Shoulder flexion 95d  145/  Shoulder extension     Shoulder abduction 85d  135   Goal status: Met  LONG TERM GOALS: Target date: 09/08/2023   Increase FOTO score to 55 Baseline: 28 Goal status: INITIAL  2.  Increase AROM R shoulder to 160d flexion and abduction Baseline: TBD at appropriate post-op interval Goal status: INITIAL  3.  Decrease worst pain to 4/10 Baseline: 10/10 Goal status: INITIAL  4.  Increase R shoulder strength to 4-/5 throughout Baseline: TBD at appropriate post-op interval Goal status: INITIAL    PLAN: PT FREQUENCY: 1-2x/week  PT DURATION: 8 weeks  PLANNED INTERVENTIONS: Therapeutic exercises, Therapeutic activity, Neuromuscular re-education, Balance training, Gait training, Patient/Family education, Self Care, Joint mobilization, Dry Needling, Electrical stimulation, Cryotherapy, Moist heat, Manual therapy, and Re-evaluation  PLAN FOR NEXT SESSION: HEP review and update, manual techniques as appropriate, aerobic tasks, ROM and flexibility activities, strengthening and PREs, TPDN, gait and balance training as needed     Hildred Laser PT 08/30/23  11:40 AM Phone: [phone] Fax: [phone]

## 2023-08-30 ENCOUNTER — Telehealth: Payer: Self-pay | Admitting: Family

## 2023-08-30 ENCOUNTER — Ambulatory Visit: Payer: Medicare PPO

## 2023-08-30 ENCOUNTER — Other Ambulatory Visit: Payer: Self-pay | Admitting: Family

## 2023-08-30 DIAGNOSIS — M25511 Pain in right shoulder: Secondary | ICD-10-CM | POA: Diagnosis not present

## 2023-08-30 DIAGNOSIS — M6281 Muscle weakness (generalized): Secondary | ICD-10-CM | POA: Diagnosis not present

## 2023-08-30 DIAGNOSIS — M12811 Other specific arthropathies, not elsewhere classified, right shoulder: Secondary | ICD-10-CM | POA: Diagnosis not present

## 2023-08-30 DIAGNOSIS — Z1231 Encounter for screening mammogram for malignant neoplasm of breast: Secondary | ICD-10-CM

## 2023-08-30 NOTE — Telephone Encounter (Signed)
Please call and check on her- if she is not responding to Flagyl/ Diflucan, I do want her to get a urine culture to make sure there is not a UTI.

## 2023-08-30 NOTE — Telephone Encounter (Signed)
Called pt and left a VM asking pt to call the office back.

## 2023-08-31 ENCOUNTER — Other Ambulatory Visit: Payer: Self-pay | Admitting: Family

## 2023-08-31 ENCOUNTER — Telehealth: Payer: Self-pay | Admitting: Family

## 2023-08-31 DIAGNOSIS — L750 Bromhidrosis: Secondary | ICD-10-CM

## 2023-08-31 NOTE — Therapy (Unsigned)
OUTPATIENT PHYSICAL THERAPY SHOULDER EVALUATION   Patient Name: [name] [name] MRN: [id number] DOB:01/22/1951, 72 y.o., female Today's Date: 08/31/2023  END OF SESSION:           Past Medical History:  Diagnosis Date   Acid reflux    ALCOHOL ABUSE, HX OF 11/06/2007   ALLERGIC RHINITIS 11/06/2007   Anxiety 04/03/2011   ASTHMA 09/13/2007   Asthma    Chronic pain syndrome 12/15/2016   COLONIC POLYPS, HX OF 02/09/2008    ADENOMATOUS POLYP   Coronary artery disease    Encounter for well adult exam without abnormal findings 04/03/2011   HYPERLIPIDEMIA 11/03/2010   HYPERTENSION 09/13/2007   HYPOTHYROIDISM 11/06/2007   Impaired glucose tolerance 07/30/2013   INSOMNIA, HX OF 09/13/2007   Lumbar degenerative disc disease 12/15/2016   OSTEOPOROSIS 11/06/2007   PAD (peripheral artery disease) (HCC)    PERIMENOPAUSAL STATUS 09/13/2007   RLS (restless legs syndrome)    Stroke (HCC)    TIA (transient ischemic attack) 11/04/2011   Past Surgical History:  Procedure Laterality Date   ABDOMINAL AORTOGRAM W/LOWER EXTREMITY N/A 08/27/2020   Procedure: ABDOMINAL AORTOGRAM W/LOWER EXTREMITY;  Surgeon: Elder Negus, MD;  Location: [facility];  Service: Cardiovascular;  Laterality: N/A;   BREAST EXCISIONAL BIOPSY Left    BREAST SURGERY  2008 and 2012   x 2 - benign, left side   carotid artery surgery Left    CESAREAN SECTION     x 3   COLONOSCOPY  multiple   2010   PERIPHERAL VASCULAR BALLOON ANGIOPLASTY  08/27/2020   Procedure: PERIPHERAL VASCULAR BALLOON ANGIOPLASTY;  Surgeon: Elder Negus, MD;  Location: [facility];  Service: Cardiovascular;;  Right SFA scoring balloon   SHOULDER ARTHROSCOPY WITH SUBACROMIAL DECOMPRESSION, ROTATOR CUFF REPAIR AND BICEP TENDON REPAIR Right 06/08/2023   Procedure: RIGHT SHOULDER ARTHROSCOPY, DEBRIDEMENT, MINI OPEN ROTATOR CUFF TEAR REPAIR AND BICEPS TENODESIS;  Surgeon: Cammy Copa, MD;  Location: [facility];   Service: Orthopedics;  Laterality: Right;   TRANSFORAMINAL LUMBAR INTERBODY FUSION (TLIF) WITH PEDICLE SCREW FIXATION 1 LEVEL Left 07/10/2020   Procedure: LEFT-SIDED LUMBAR FOUR-FIVE TRANSFORAMINAL LUMBAR INTERBODY FUSION WITH INSTRUMENTATION AND ALLOGRAFT;  Surgeon: Estill Bamberg, MD;  Location: [facility];  Service: Orthopedics;  Laterality: Left;   Patient Active Problem List   Diagnosis Date Noted   Complete tear of right rotator cuff 06/13/2023   Biceps tendonitis on right 06/13/2023   Synovitis of right shoulder 06/13/2023   Claudication in peripheral vascular disease (HCC) 08/26/2020   Neurogenic claudication 07/10/2020   Degenerative lumbar spinal stenosis 05/16/2020   Sacroiliac pain 08/10/2019   Anxiety and depression 05/11/2019   Chest pain 05/09/2019   Arthritis of sacroiliac joint of both sides 05/01/2019   Polyarthralgia 12/28/2018   Preventative health care 10/20/2018   RLS (restless legs syndrome) 09/27/2018   Insomnia disorder related to known organic factor 06/27/2018   PLMD (periodic limb movement disorder) 06/27/2018   Hot flashes due to menopause 04/25/2018   Palpitations 04/25/2018   Diaphoresis 04/25/2018   Long term current use of antithrombotics/antiplatelets 04/19/2018   Insomnia 04/19/2018   Intractable episodic headache 02/28/2018   Numbness and tingling of right arm 02/28/2018   Toe pain, right 10/19/2017   Rotator cuff arthropathy of left shoulder 06/02/2017   Trigger thumb of left hand 06/02/2017   Lipoma 05/06/2017   Pain of left thumb 05/06/2017   Chronic pain syndrome 12/15/2016   Lumbar degenerative disc disease 12/15/2016   Left  leg pain 01/28/2016   Left shoulder pain 04/09/2015   Impaired glucose tolerance 07/30/2013   Abnormal breath sounds 07/30/2013   Asymptomatic bilateral carotid artery stenosis 05/05/2012   Anxiety 04/03/2011   CVA (cerebral infarction) 03/15/2011   HLD (hyperlipidemia) 11/03/2010   Hx of adenomatous colonic polyps  02/09/2008   Hypothyroidism 11/06/2007   ALLERGIC RHINITIS 11/06/2007   OSTEOPOROSIS 11/06/2007   ALCOHOL ABUSE, HX OF 11/06/2007   Essential hypertension 09/13/2007   Asthma 09/13/2007   PERIMENOPAUSAL STATUS 09/13/2007   Chronic insomnia 09/13/2007    PCP: [name], FNP   REFERRING PROVIDER: Cammy Copa, MD  REFERRING DIAG: M75.101 (ICD-10-CM) - Tear of right supraspinatus tendon  THERAPY DIAG:  No diagnosis found.  Rationale for Evaluation and Treatment: Rehabilitation  ONSET DATE: 06/08/23 surgery  SUBJECTIVE:                                                                                                                                                                                      SUBJECTIVE STATEMENT: Ran out of Ambien this weekend and had difficulty sleeping.  No adverse effect on R shoulder symptoms however.  Hand dominance: Right  PERTINENT HISTORY: Plan: [name] is a patient is about 4 weeks out right shoulder arthroscopy with rotator cuff tear repair using a 2 x 2 construct.  Biceps tenodesis also performed.  Patient did use the brace but she feels like is not helping her.  Having a lot of pain.  Taking oxycodone and muscle relaxer.  On examination she is somewhat predictably stiff.  Rotator cuff feels good with passive range of motion.  She has about 20 degrees of external rotation 70 degrees of abduction and about 80 of forward flexion.  Plan at this time is to discontinue sling.  No lifting with the right arm.  She needs therapy about 2-3 times a week for the next 4 weeks to work primarily on range of motion but it is okay to start strengthening only at the 6-week postop mark which for her would be in 2 weeks.  Come back in 4 weeks for clinical recheck.  Encouraged her to get an overhead pulley so she can work on stretching it out a little bit more for overhead flexion.  PAIN:  Are you having pain? Yes: NPRS scale: 10/10 Pain location: R  shoulder Pain description: ache  Aggravating factors: AROM Relieving factors: meds and rests  PRECAUTIONS: Other: post-op RCR R 06/08/23  RED FLAGS: None   WEIGHT BEARING RESTRICTIONS: No  FALLS:  Has patient fallen in last 6 months? No   OCCUPATION: retired  PLOF: Independent  PATIENT GOALS: To regain the  use of my shoulder  NEXT MD VISIT: 08/04/23   OBJECTIVE:  PATIENT SURVEYS:  FOTO 28(55 predicted)  POSTURE: Elevated R shoulder  UPPER EXTREMITY ROM:   PROM Right eval Left eval 08/25/23 R A/PROM  Shoulder flexion 95d  145/  Shoulder extension     Shoulder abduction 85d  135  Shoulder adduction     Shoulder internal rotation 65d  70d  Shoulder external rotation 32d  65d  Elbow flexion WNL    Elbow extension WNL    Wrist flexion     Wrist extension     Wrist ulnar deviation     Wrist radial deviation     Wrist pronation     Wrist supination     (Blank rows = not tested)  UPPER EXTREMITY MMT: Deferred due to post-op status  MMT Right eval Left eval R 08/25/23  Shoulder flexion   3+  Shoulder extension   3+  Shoulder abduction   3+  Shoulder adduction     Shoulder internal rotation   4-  Shoulder external rotation   4-  Middle trapezius     Lower trapezius     Elbow flexion     Elbow extension     Wrist flexion     Wrist extension     Wrist ulnar deviation     Wrist radial deviation     Wrist pronation     Wrist supination     Grip strength (lbs)     (Blank rows = not tested)  SHOULDER SPECIAL TESTS: deferred  JOINT MOBILITY TESTING:  deferred  PALPATION:  deferred    TODAY'S TREATMENT:     [facility] Adult PT Treatment:                                                DATE: 08/30/23 Therapeutic Exercise: Nustep L4 6 min Supine flexion B holding ball 15x Scaption on wall 15x 1# Scapular wall slides 15x YTB PNF D1 F/E 15x Manual Therapy: Grade III mobs into distraction posterior and inferior directions 5x10 ea 4 way scapula 15x  ea. R pec major release 2 min  [facility] Adult PT Treatment:                                                DATE: 08/27/23 Therapeutic Exercise: Nustep L4 6 min Scaption on wall 15x Scapular wall slides 15x pillow case PNF D1 F/E 15x  Manual Therapy: Grade III mobs into distraction posterior and inferior directions 5x10 ea 4 way scapula 15x ea. R pec minor release 2 min   [facility] Adult PT Treatment:                                                DATE: 08/25/23 Therapeutic Exercise: Nustep L2 6 min Supine press/protract 15x 500g Supine flexion 15x 500g S/L ER 15x 500g S/L abd 15x 500g Prone flexion 15x 500g Prone extension 15x 500g Prone hor abd 15x 500g Prone row 500g 15x Seated hor abd YTB 15x Seated ER YTB 15x Manual Therapy: PROM/PNF D1 F/E 15x Grade III  mobs into distraction posterior and inferior directions 5x10 ea STM R pec minor   [facility] Adult PT Treatment:                                                DATE: 08/05/23 Therapeutic Exercise: Nustep L2 6 min Tennis ball used as weight: Supine press/protract 15x  Supine flexion 15x S/L ER 15x S/L abd 15x Prone flexion 15x Prone extension 15x Prone hor abd 15x Manual Therapy: PROM all planes 95d Abd, 70d IR, 45d ER, 140d FF Grade II mobs into distraction posterior and inferior directions 5x10    [facility] Adult PT Treatment:                                                DATE: 08/02/23 Therapeutic Exercise: Nustep L2 6 min (Tennis ball used as weight) Supine press/protract 15x  Supine flexion 15x S/L ER 15x S/L abd 15x Prone flexion 15x Prone extension 15x Prone hor abd 15x Manual Therapy: Skilled palpation to identify taught bands in R trapezius and teres major Trigger Point Dry Needling Treatment: Pre-treatment instruction: Patient instructed on dry needling rationale, procedures, and possible side effects including pain during treatment (achy,cramping feeling), bruising, drop of blood, lightheadedness, nausea,  sweating. Patient Consent Given: Yes Education handout provided: No Muscles treated: R UT and teres major  Needle size and number: .25x32mm x 2 Electrical stimulation performed: No Parameters: N/A Treatment response/outcome: Twitch response elicited, Palpable decrease in muscle tension, and less discomfort with AROM Post-treatment instructions: Patient instructed to expect possible mild to moderate muscle soreness later today and/or tomorrow. Patient instructed in methods to reduce muscle soreness and to continue prescribed HEP. If patient was dry needled over the lung field, patient was instructed on signs and symptoms of pneumothorax and, however unlikely, to see immediate medical attention should they occur. Patient was also educated on signs and symptoms of infection and to seek medical attention should they occur. Patient verbalized understanding of these instructions and education.    [facility] Adult PT Treatment:                                                DATE: 07/28/23 Therapeutic Exercise: Nustep L2 6 min Seated scaption 10x B Supine PNF D1 F/E 15x Manual Therapy: PROM all planes 95d Abd, 70d IR, 45d ER, 140d FF Grade II mobs into distraction posterior and inferior directions 5x10  4 way scapula 15x ea.    PATIENT EDUCATION: Education details: HEP Person educated: Patient Education method: Explanation Education comprehension: verbalized understanding and needs further education  HOME EXERCISE PROGRAM: Access Code: [email or web address] URL: [email or web address] Date: 08/25/2023 Prepared by: Gustavus Bryant  Exercises - Seated Shoulder Horizontal Abduction with Resistance  - 2 x daily - 5 x weekly - 2 sets - 15 reps - Shoulder External Rotation and Scapular Retraction with Resistance  - 2 x daily - 5 x weekly - 2 sets - 15 reps - Scaption Wall Slide with Towel  - 2 x daily - 5 x weekly - 2 sets - 15 reps   ASSESSMENT: CLINICAL IMPRESSION:  Overall decreased pain and  improved function.  Advanced difficulty and resistance as noted.  Continued manual techniques to reduce soft tissue restrictions and promote mobility and function.  Continues to struggle with soft tissue restrictions and TrPs.  Cued not to overexert and maintain proper form and pacing  Today's session incorporated TPDN as noted above f/b aerobic work for increased circulation prior to initiating AROM.  Initially noted discomfort with AROM but symptoms lessened with repetitions.  Marked fatigue noted following AROM tasks.   OBJECTIVE IMPAIRMENTS: decreased activity tolerance, decreased knowledge of condition, decreased knowledge of use of DME, decreased mobility, decreased ROM, decreased strength, increased fascial restrictions, impaired perceived functional ability, increased muscle spasms, impaired UE functional use, postural dysfunction, and pain.   ACTIVITY LIMITATIONS: carrying, lifting, sleeping, bed mobility, bathing, toileting, dressing, reach over head, and hygiene/grooming  PARTICIPATION LIMITATIONS: meal prep, cleaning, laundry, medication management, driving, and community activity  PERSONAL FACTORS: Age, Fitness, Past/current experiences, and Time since onset of injury/illness/exacerbation are also affecting patient's functional outcome.    GOALS: Goals reviewed with patient? No  SHORT TERM GOALS: Target date: 08/11/2023   Patient to demonstrate independence in HEP  Baseline: [email or web address] Goal status: Met  2.  Decrease worst pain to 6/10 Baseline: 10/10; 08/25/23 10 at worst but minimal resting Goal status: Ongoing  3.  Increase PROM R shoulder to 125d flexion/abd Baseline:  PROM Right eval Left eval 08/25/23 R A/PROM  Shoulder flexion 95d  145/  Shoulder extension     Shoulder abduction 85d  135   Goal status: Met  LONG TERM GOALS: Target date: 09/08/2023   Increase FOTO score to 55 Baseline: 28 Goal status: INITIAL  2.  Increase AROM R shoulder to 160d flexion and  abduction Baseline: TBD at appropriate post-op interval Goal status: INITIAL  3.  Decrease worst pain to 4/10 Baseline: 10/10 Goal status: INITIAL  4.  Increase R shoulder strength to 4-/5 throughout Baseline: TBD at appropriate post-op interval Goal status: INITIAL    PLAN: PT FREQUENCY: 1-2x/week  PT DURATION: 8 weeks  PLANNED INTERVENTIONS: Therapeutic exercises, Therapeutic activity, Neuromuscular re-education, Balance training, Gait training, Patient/Family education, Self Care, Joint mobilization, Dry Needling, Electrical stimulation, Cryotherapy, Moist heat, Manual therapy, and Re-evaluation  PLAN FOR NEXT SESSION: HEP review and update, manual techniques as appropriate, aerobic tasks, ROM and flexibility activities, strengthening and PREs, TPDN, gait and balance training as needed     Hildred Laser PT 08/31/23  11:48 AM Phone: [phone] Fax: [phone]

## 2023-08-31 NOTE — Telephone Encounter (Signed)
Spoke with pt, pt states she would like to go to [place] [facility].

## 2023-08-31 NOTE — Telephone Encounter (Signed)
Patient called and would like a call back regarding getting a urine sample. She is confused where to go. Please call

## 2023-09-01 ENCOUNTER — Ambulatory Visit: Payer: Medicare PPO

## 2023-09-01 DIAGNOSIS — M12811 Other specific arthropathies, not elsewhere classified, right shoulder: Secondary | ICD-10-CM

## 2023-09-01 DIAGNOSIS — M25511 Pain in right shoulder: Secondary | ICD-10-CM

## 2023-09-01 DIAGNOSIS — M6281 Muscle weakness (generalized): Secondary | ICD-10-CM

## 2023-09-01 NOTE — Telephone Encounter (Signed)
Pt called & requested to speak with nurse. She stated [facility] did not let her provide urine sample. Please advise.

## 2023-09-02 ENCOUNTER — Other Ambulatory Visit: Payer: Self-pay | Admitting: Family

## 2023-09-02 NOTE — Telephone Encounter (Signed)
Spoke with pt, pt is aware and expressed understanding. Pt states she will try to go this evening or tomorrow to the [place] lab.

## 2023-09-03 ENCOUNTER — Other Ambulatory Visit: Payer: Medicare PPO

## 2023-09-03 DIAGNOSIS — L750 Bromhidrosis: Secondary | ICD-10-CM | POA: Diagnosis not present

## 2023-09-05 LAB — URINE CULTURE
MICRO NUMBER:: 15495184
SPECIMEN QUALITY:: ADEQUATE

## 2023-09-06 ENCOUNTER — Other Ambulatory Visit: Payer: Self-pay | Admitting: Family

## 2023-09-06 MED ORDER — SULFAMETHOXAZOLE-TRIMETHOPRIM 800-160 MG PO TABS: 1.0000 | ORAL_TABLET | Freq: Two times a day (BID) | ORAL | 0 refills | Status: DC

## 2023-09-07 DIAGNOSIS — L403 Pustulosis palmaris et plantaris: Secondary | ICD-10-CM | POA: Diagnosis not present

## 2023-09-07 DIAGNOSIS — Z79899 Other long term (current) drug therapy: Secondary | ICD-10-CM | POA: Diagnosis not present

## 2023-09-07 DIAGNOSIS — L400 Psoriasis vulgaris: Secondary | ICD-10-CM | POA: Diagnosis not present

## 2023-09-07 DIAGNOSIS — L720 Epidermal cyst: Secondary | ICD-10-CM | POA: Diagnosis not present

## 2023-09-07 DIAGNOSIS — D485 Neoplasm of uncertain behavior of skin: Secondary | ICD-10-CM | POA: Diagnosis not present

## 2023-09-07 NOTE — Therapy (Unsigned)
OUTPATIENT PHYSICAL THERAPY NOTE   Patient Name: [name] [name] MRN: [id number] DOB:[birth year], 72 y.o., female Today's Date: 09/08/2023   END OF SESSION:  PT End of Session - 09/08/23 1406     Visit Number 11    Number of Visits 16    Date for PT Re-Evaluation 09/13/23    Authorization Type [employer]    Authorization Time Period 07/19/2023 - 09/07/2023    PT Start Time 1400    PT Stop Time 1445    PT Time Calculation (min) 45 min    Activity Tolerance Patient tolerated treatment well    Behavior During Therapy [facility] for tasks assessed/performed             Past Medical History:  Diagnosis Date   Acid reflux    ALCOHOL ABUSE, HX OF 11/06/2007   ALLERGIC RHINITIS 11/06/2007   Anxiety 04/03/2011   ASTHMA 09/13/2007   Asthma    Chronic pain syndrome 12/15/2016   COLONIC POLYPS, HX OF 02/09/2008    ADENOMATOUS POLYP   Coronary artery disease    Encounter for well adult exam without abnormal findings 04/03/2011   HYPERLIPIDEMIA 11/03/2010   HYPERTENSION 09/13/2007   HYPOTHYROIDISM 11/06/2007   Impaired glucose tolerance 07/30/2013   INSOMNIA, HX OF 09/13/2007   Lumbar degenerative disc disease 12/15/2016   OSTEOPOROSIS 11/06/2007   PAD (peripheral artery disease) (HCC)    PERIMENOPAUSAL STATUS 09/13/2007   RLS (restless legs syndrome)    Stroke (HCC)    TIA (transient ischemic attack) 11/04/2011   Past Surgical History:  Procedure Laterality Date   ABDOMINAL AORTOGRAM W/LOWER EXTREMITY N/A 08/27/2020   Procedure: ABDOMINAL AORTOGRAM W/LOWER EXTREMITY;  Surgeon: Elder Negus, MD;  [facility]: [facility];  Service: Cardiovascular;  Laterality: N/A;   BREAST EXCISIONAL BIOPSY Left    BREAST SURGERY  2008 and 2012   x 2 - benign, left side   carotid artery surgery Left    CESAREAN SECTION     x 3   COLONOSCOPY  multiple   2010   PERIPHERAL VASCULAR BALLOON ANGIOPLASTY  08/27/2020   Procedure: PERIPHERAL VASCULAR BALLOON ANGIOPLASTY;  Surgeon:  Elder Negus, MD;  Location: [facility];  Service: Cardiovascular;;  Right SFA scoring balloon   SHOULDER ARTHROSCOPY WITH SUBACROMIAL DECOMPRESSION, ROTATOR CUFF REPAIR AND BICEP TENDON REPAIR Right 06/08/2023   Procedure: RIGHT SHOULDER ARTHROSCOPY, DEBRIDEMENT, MINI OPEN ROTATOR CUFF TEAR REPAIR AND BICEPS TENODESIS;  Surgeon: Cammy Copa, MD;  Location: [facility] OR;  Service: Orthopedics;  Laterality: Right;   TRANSFORAMINAL LUMBAR INTERBODY FUSION (TLIF) WITH PEDICLE SCREW FIXATION 1 LEVEL Left 07/10/2020   Procedure: LEFT-SIDED LUMBAR FOUR-FIVE TRANSFORAMINAL LUMBAR INTERBODY FUSION WITH INSTRUMENTATION AND ALLOGRAFT;  Surgeon: Estill Bamberg, MD;  Location: [facility] OR;  Service: Orthopedics;  Laterality: Left;   Patient Active Problem List   Diagnosis Date Noted   Complete tear of right rotator cuff 06/13/2023   Biceps tendonitis on right 06/13/2023   Synovitis of right shoulder 06/13/2023   Claudication in peripheral vascular disease (HCC) 08/26/2020   Neurogenic claudication 07/10/2020   Degenerative lumbar spinal stenosis 05/16/2020   Sacroiliac pain 08/10/2019   Anxiety and depression 05/11/2019   Chest pain 05/09/2019   Arthritis of sacroiliac joint of both sides 05/01/2019   Polyarthralgia 12/28/2018   Preventative health care 10/20/2018   RLS (restless legs syndrome) 09/27/2018   Insomnia disorder related to known organic factor 06/27/2018   PLMD (periodic limb movement disorder) 06/27/2018   Hot flashes  due to menopause 04/25/2018   Palpitations 04/25/2018   Diaphoresis 04/25/2018   Long term current use of antithrombotics/antiplatelets 04/19/2018   Insomnia 04/19/2018   Intractable episodic headache 02/28/2018   Numbness and tingling of right arm 02/28/2018   Toe pain, right 10/19/2017   Rotator cuff arthropathy of left shoulder 06/02/2017   Trigger thumb of left hand 06/02/2017   Lipoma 05/06/2017   Pain of left thumb 05/06/2017   Chronic pain syndrome  12/15/2016   Lumbar degenerative disc disease 12/15/2016   Left leg pain 01/28/2016   Left shoulder pain 04/09/2015   Impaired glucose tolerance 07/30/2013   Abnormal breath sounds 07/30/2013   Asymptomatic bilateral carotid artery stenosis 05/05/2012   Anxiety 04/03/2011   CVA (cerebral infarction) 03/15/2011   HLD (hyperlipidemia) 11/03/2010   Hx of adenomatous colonic polyps 02/09/2008   Hypothyroidism 11/06/2007   ALLERGIC RHINITIS 11/06/2007   OSTEOPOROSIS 11/06/2007   ALCOHOL ABUSE, HX OF 11/06/2007   Essential hypertension 09/13/2007   Asthma 09/13/2007   PERIMENOPAUSAL STATUS 09/13/2007   Chronic insomnia 09/13/2007    PCP: [name], FNP   REFERRING PROVIDER: Cammy Copa, MD  REFERRING DIAG: M75.101 (ICD-10-CM) - Tear of right supraspinatus tendon  THERAPY DIAG:  Muscle weakness (generalized) - Plan: PT plan of care cert/re-cert  Rotator cuff arthropathy of right shoulder - Plan: PT plan of care cert/re-cert  Acute pain of right shoulder - Plan: PT plan of care cert/re-cert  Rationale for Evaluation and Treatment: Rehabilitation  ONSET DATE: 06/08/23 surgery  SUBJECTIVE:                                                                                                                                                                                      SUBJECTIVE STATEMENT: Reports continued R shoulder pain but has returned to driving and ADLs w/o limitation  Hand dominance: Right  PERTINENT HISTORY: Plan: [name] is a patient is about 4 weeks out right shoulder arthroscopy with rotator cuff tear repair using a 2 x 2 construct.  Biceps tenodesis also performed.  Patient did use the brace but she feels like is not helping her.  Having a lot of pain.  Taking oxycodone and muscle relaxer.  On examination she is somewhat predictably stiff.  Rotator cuff feels good with passive range of motion.  She has about 20 degrees of external rotation 70 degrees of  abduction and about 80 of forward flexion.  Plan at this time is to discontinue sling.  No lifting with the right arm.  She needs therapy about 2-3 times a week for the next 4 weeks to work primarily on range of motion but it  is okay to start strengthening only at the 6-week postop mark which for her would be in 2 weeks.  Come back in 4 weeks for clinical recheck.  Encouraged her to get an overhead pulley so she can work on stretching it out a little bit more for overhead flexion.  PAIN:  Are you having pain? Yes: NPRS scale: 10/10 Pain location: R shoulder Pain description: ache  Aggravating factors: AROM Relieving factors: meds and rests  PRECAUTIONS: Other: post-op RCR R 06/08/23  RED FLAGS: None   WEIGHT BEARING RESTRICTIONS: No  FALLS:  Has patient fallen in last 6 months? No   OCCUPATION: retired  PLOF: Independent  PATIENT GOALS: To regain the use of my shoulder  NEXT MD VISIT: 08/04/23   OBJECTIVE:  PATIENT SURVEYS:  FOTO 28(55 predicted)  POSTURE: Elevated R shoulder  UPPER EXTREMITY ROM:   PROM Right eval Left eval 08/25/23 R A/PROM 09/01/23 R PROM  Shoulder flexion 95d  145/ 160  Shoulder extension      Shoulder abduction 85d  135 165  Shoulder adduction      Shoulder internal rotation 65d  70d 70  Shoulder external rotation 32d  65d 70  Elbow flexion WNL     Elbow extension WNL     Wrist flexion      Wrist extension      Wrist ulnar deviation      Wrist radial deviation      Wrist pronation      Wrist supination      (Blank rows = not tested)  UPPER EXTREMITY MMT: Deferred due to post-op status  MMT Right eval Left eval R 08/25/23 R 09/08/23  Shoulder flexion   3+ 4-  Shoulder extension   3+ 4-  Shoulder abduction   3+ 4-  Shoulder adduction      Shoulder internal rotation   4- 4-  Shoulder external rotation   4- 4-  Middle trapezius      Lower trapezius      Elbow flexion      Elbow extension      Wrist flexion      Wrist  extension      Wrist ulnar deviation      Wrist radial deviation      Wrist pronation      Wrist supination      Grip strength (lbs)      (Blank rows = not tested)  SHOULDER SPECIAL TESTS: deferred  JOINT MOBILITY TESTING:  deferred  PALPATION:  deferred    TODAY'S TREATMENT:     [facility] Adult PT Treatment:                                                DATE: 09/08/23 Therapeutic Exercise: D1 F/E PNF 15x Manual Therapy: Skilled palpation to identify taught and irritable bands in R anterior deltoid and pec major Trigger Point Dry Needling Treatment: Pre-treatment instruction: Patient instructed on dry needling rationale, procedures, and possible side effects including pain during treatment (achy,cramping feeling), bruising, drop of blood, lightheadedness, nausea, sweating. Patient Consent Given: Yes Education handout provided: No Muscles treated: R anterior deltoin and R pec major  Needle size and number: .25x56mm x 2 Electrical stimulation performed: No Parameters: N/A Treatment response/outcome: Twitch response elicited and Palpable decrease in muscle tension Post-treatment instructions: Patient instructed to expect possible mild to  moderate muscle soreness later today and/or tomorrow. Patient instructed in methods to reduce muscle soreness and to continue prescribed HEP. If patient was dry needled over the lung field, patient was instructed on signs and symptoms of pneumothorax and, however unlikely, to see immediate medical attention should they occur. Patient was also educated on signs and symptoms of infection and to seek medical attention should they occur. Patient verbalized understanding of these instructions and education.  Grade III mobs into distraction posterior and inferior directions 5x10 ea  [facility] Adult PT Treatment:                                                DATE: 0918/24 Therapeutic Exercise: Nustep L4 6 min Supine press/protract 15x 1000g ball Supine flexion  15x 1000g S/L ER 15x 1000g S/L abd 15x 1000g Prone flexion 15x 1000g Prone extension 15x 1000g Prone hor abd 15x 1000g Prone row 15x 1000g Manual Therapy: Grade III mobs into distraction posterior and inferior directions 5x10 ea 4 way scapula 15x ea. R pec major release 2 min   [facility] Adult PT Treatment:                                                DATE: 08/30/23 Therapeutic Exercise: Nustep L4 6 min Supine flexion B holding ball 15x Scaption on wall 15x 1# Scapular wall slides 15x YTB PNF D1 F/E 15x Manual Therapy: Grade III mobs into distraction posterior and inferior directions 5x10 ea 4 way scapula 15x ea. R pec major release 2 min  [facility] Adult PT Treatment:                                                DATE: 08/27/23 Therapeutic Exercise: Nustep L4 6 min Scaption on wall 15x Scapular wall slides 15x pillow case PNF D1 F/E 15x  Manual Therapy: Grade III mobs into distraction posterior and inferior directions 5x10 ea 4 way scapula 15x ea. R pec minor release 2 min     PATIENT EDUCATION: Education details: HEP Person educated: Patient Education method: Explanation Education comprehension: verbalized understanding and needs further education  HOME EXERCISE PROGRAM: Access Code: [email or web address] URL: [email or web address] Date: 08/25/2023 Prepared by: Gustavus Bryant  Exercises - Seated Shoulder Horizontal Abduction with Resistance  - 2 x daily - 5 x weekly - 2 sets - 15 reps - Shoulder External Rotation and Scapular Retraction with Resistance  - 2 x daily - 5 x weekly - 2 sets - 15 reps - Scaption Wall Slide with Towel  - 2 x daily - 5 x weekly - 2 sets - 15 reps   ASSESSMENT: CLINICAL IMPRESSION: Session consisted of review of HEP and progress.  Patient has returned to ADLs w/o setback and feels she is confident to transition to self care.  She will f/u with MD next week for additional guidance and need to continue additional OPPT.   Today's session  incorporated TPDN as noted above f/b aerobic work for increased circulation prior to initiating AROM.  Initially noted discomfort with AROM but symptoms lessened with repetitions.  Marked fatigue noted  following AROM tasks.   OBJECTIVE IMPAIRMENTS: decreased activity tolerance, decreased knowledge of condition, decreased knowledge of use of DME, decreased mobility, decreased ROM, decreased strength, increased fascial restrictions, impaired perceived functional ability, increased muscle spasms, impaired UE functional use, postural dysfunction, and pain.   ACTIVITY LIMITATIONS: carrying, lifting, sleeping, bed mobility, bathing, toileting, dressing, reach over head, and hygiene/grooming  PARTICIPATION LIMITATIONS: meal prep, cleaning, laundry, medication management, driving, and community activity  PERSONAL FACTORS: Age, Fitness, Past/current experiences, and Time since onset of injury/illness/exacerbation are also affecting patient's functional outcome.    GOALS: Goals reviewed with patient? No  SHORT TERM GOALS: Target date: 08/11/2023   Patient to demonstrate independence in HEP  Baseline: [email or web address] Goal status: Met  2.  Decrease worst pain to 6/10 Baseline: 10/10; 08/25/23 10 at worst but minimal resting; 09/01/23 5/10 Goal status: Met  3.  Increase PROM R shoulder to 125d flexion/abd Baseline:  PROM Right eval Left eval 08/25/23 R A/PROM  Shoulder flexion 95d  145/  Shoulder extension     Shoulder abduction 85d  135   Goal status: Met  LONG TERM GOALS: Target date: 09/08/2023   Increase FOTO score to 55 Baseline: 28; 09/01/23 24 Goal status: Ongoing  2.  Increase AROM R shoulder to 160d flexion and abduction Baseline: TBD at appropriate post-op interval PROM Right eval Left eval 08/25/23 R A/PROM 09/01/23 R PROM  Shoulder flexion 95d  145/ 160  Shoulder extension      Shoulder abduction 85d  135 165  Shoulder adduction      Shoulder internal rotation 65d  70d 70   Shoulder external rotation 32d  65d 70  Elbow flexion WNL     Elbow extension WNL                                          Goal status: Met  3.  Decrease worst pain to 4/10 Baseline: 10/10; 09/01/23 5/10 Goal status: Ongoing  4.  Increase R shoulder strength to 4-/5 throughout Baseline: TBD at appropriate post-op interval MMT Right eval Left eval R 08/25/23 R 09/08/23  Shoulder flexion   3+ 4-  Shoulder extension   3+ 4-  Shoulder abduction   3+ 4-  Shoulder adduction      Shoulder internal rotation   4- 4-  Shoulder external rotation   4- 4-   Goal status: Met    PLAN: PT FREQUENCY: 1x/week  PT DURATION: 4 weeks  PLANNED INTERVENTIONS: Therapeutic exercises, Therapeutic activity, Neuromuscular re-education, Balance training, Gait training, Patient/Family education, Self Care, Joint mobilization, Dry Needling, Electrical stimulation, Cryotherapy, Moist heat, Manual therapy, and Re-evaluation  PLAN FOR NEXT SESSION: HEP review and update, manual techniques as appropriate, aerobic tasks, ROM and flexibility activities, strengthening and PREs, TPDN, gait and balance training as needed     Hildred Laser PT 09/08/23  3:12 PM Phone: [phone] Fax: [phone]  Referring diagnosis? R RCR Treatment diagnosis? (if different than referring diagnosis) R shoulder pain What was this (referring dx) caused by? [x]  Surgery []  Fall []  Ongoing issue []  Arthritis []  Other: ____________  Laterality: [x]  Rt []  Lt []  Both  Check all possible CPT codes:  *CHOOSE 10 OR LESS*    []  97110 (Therapeutic Exercise)  []  92507 (SLP Treatment)  []  29528 (Neuro Re-ed)   []  92526 (Swallowing Treatment)   []  97116 (Gait Training)   []  [id number] (  Cognitive Training, 1st 15 minutes) []  97140 (Manual Therapy)   []  97130 (Cognitive Training, each add'l 15 minutes)  []  97164 (Re-evaluation)                              []  Other, List CPT Code ____________  []  97530 (Therapeutic  Activities)     []  97535 (Self Care)   [x]  All codes above (97110 - 97535)  []  97012 (Mechanical Traction)  []  97014 (E-stim Unattended)  []  97032 (E-stim manual)  []  97033 (Ionto)  []  97035 (Ultrasound) []  97750 (Physical Performance Training) []  [id number] (Aquatic Therapy) []  97016 (Vasopneumatic Device) []  [id number] (Paraffin) []  97034 (Contrast Bath) []  97597 (Wound Care 1st 20 sq cm) []  97598 (Wound Care each add'l 20 sq cm) []  97760 (Orthotic Fabrication, Fitting, Training Initial) []  [id number] (Prosthetic Management and Training Initial) []  [phone] (Orthotic or Prosthetic Training/ Modification Subsequent)

## 2023-09-08 ENCOUNTER — Ambulatory Visit: Payer: Medicare PPO

## 2023-09-08 DIAGNOSIS — M6281 Muscle weakness (generalized): Secondary | ICD-10-CM

## 2023-09-08 DIAGNOSIS — M12811 Other specific arthropathies, not elsewhere classified, right shoulder: Secondary | ICD-10-CM

## 2023-09-08 DIAGNOSIS — M25511 Pain in right shoulder: Secondary | ICD-10-CM | POA: Diagnosis not present

## 2023-09-20 ENCOUNTER — Ambulatory Visit: Payer: Medicare PPO | Admitting: Orthopedic Surgery

## 2023-09-20 DIAGNOSIS — M75101 Unspecified rotator cuff tear or rupture of right shoulder, not specified as traumatic: Secondary | ICD-10-CM

## 2023-09-20 MED ORDER — METHOCARBAMOL 500 MG PO TABS: 500.0000 mg | ORAL_TABLET | Freq: Two times a day (BID) | ORAL | 0 refills | Status: DC | PRN

## 2023-09-21 ENCOUNTER — Ambulatory Visit
Admission: RE | Admit: 2023-09-21 | Discharge: 2023-09-21 | Disposition: A | Discharge status: Home / Self Care | Payer: Medicare PPO | Source: Ambulatory Visit | Attending: Family | Admitting: Family

## 2023-09-21 ENCOUNTER — Encounter: Payer: Self-pay | Admitting: Orthopedic Surgery

## 2023-09-21 DIAGNOSIS — Z1231 Encounter for screening mammogram for malignant neoplasm of breast: Secondary | ICD-10-CM | POA: Diagnosis not present

## 2023-09-21 NOTE — Progress Notes (Unsigned)
Post-Op Visit Note   Patient: [name] [name]           Date of Birth: 08/19/1951           MRN: [id number] Visit Date: 09/20/2023 PCP: [name], FNP   Assessment & Plan:  Chief Complaint:  Chief Complaint  Patient presents with   Right Shoulder - Routine Post Op        right shoulder rotator cuff repair and biceps tenodesis on 06/08/2023     Visit Diagnoses:  1. Tear of right supraspinatus tendon     Plan: Patient is now about 3 and half months out right shoulder rotator cuff tear repair and biceps tenodesis.  Has good and bad days.  Finished physical therapy last week which helped a lot.  She uses a sleep aid.  On examination her range of motion is 40/90/140 with no grinding.  Rotator cuff strength has improved significantly.  Plan is refill Robaxin and continue with strengthening exercises and follow-up as needed.  Follow-Up Instructions: No follow-ups on file.   Orders:  No orders of the defined types were placed in this encounter.  Meds ordered this encounter  Medications   methocarbamol (ROBAXIN) 500 MG tablet    Sig: Take 1 tablet (500 mg total) by mouth every 12 (twelve) hours as needed.    Dispense:  30 tablet    Refill:  0    Imaging: No results found.  PMFS History: Patient Active Problem List   Diagnosis Date Noted   Complete tear of right rotator cuff 06/13/2023   Biceps tendonitis on right 06/13/2023   Synovitis of right shoulder 06/13/2023   Claudication in peripheral vascular disease (HCC) 08/26/2020   Neurogenic claudication 07/10/2020   Degenerative lumbar spinal stenosis 05/16/2020   Sacroiliac pain 08/10/2019   Anxiety and depression 05/11/2019   Chest pain 05/09/2019   Arthritis of sacroiliac joint of both sides (HCC) 05/01/2019   Polyarthralgia 12/28/2018   Preventative health care 10/20/2018   RLS (restless legs syndrome) 09/27/2018   Insomnia disorder related to known organic factor 06/27/2018   PLMD (periodic limb  movement disorder) 06/27/2018   Hot flashes due to menopause 04/25/2018   Palpitations 04/25/2018   Diaphoresis 04/25/2018   Long term current use of antithrombotics/antiplatelets 04/19/2018   Insomnia 04/19/2018   Intractable episodic headache 02/28/2018   Numbness and tingling of right arm 02/28/2018   Toe pain, right 10/19/2017   Rotator cuff arthropathy of left shoulder 06/02/2017   Trigger thumb of left hand 06/02/2017   Lipoma 05/06/2017   Pain of left thumb 05/06/2017   Chronic pain syndrome 12/15/2016   Lumbar degenerative disc disease 12/15/2016   Left leg pain 01/28/2016   Left shoulder pain 04/09/2015   Impaired glucose tolerance 07/30/2013   Abnormal breath sounds 07/30/2013   Asymptomatic bilateral carotid artery stenosis 05/05/2012   Anxiety 04/03/2011   Cerebral infarction (HCC) 03/15/2011   HLD (hyperlipidemia) 11/03/2010   Hx of adenomatous colonic polyps 02/09/2008   Hypothyroidism 11/06/2007   ALLERGIC RHINITIS 11/06/2007   OSTEOPOROSIS 11/06/2007   ALCOHOL ABUSE, HX OF 11/06/2007   Essential hypertension 09/13/2007   Asthma 09/13/2007   PERIMENOPAUSAL STATUS 09/13/2007   Chronic insomnia 09/13/2007   Past Medical History:  Diagnosis Date   Acid reflux    ALCOHOL ABUSE, HX OF 11/06/2007   ALLERGIC RHINITIS 11/06/2007   Anxiety 04/03/2011   ASTHMA 09/13/2007   Asthma    Chronic pain syndrome 12/15/2016   COLONIC POLYPS,  HX OF 02/09/2008    ADENOMATOUS POLYP   Coronary artery disease    Encounter for well adult exam without abnormal findings 04/03/2011   HYPERLIPIDEMIA 11/03/2010   HYPERTENSION 09/13/2007   HYPOTHYROIDISM 11/06/2007   Impaired glucose tolerance 07/30/2013   INSOMNIA, HX OF 09/13/2007   Lumbar degenerative disc disease 12/15/2016   OSTEOPOROSIS 11/06/2007   PAD (peripheral artery disease) (HCC)    PERIMENOPAUSAL STATUS 09/13/2007   RLS (restless legs syndrome)    Stroke [facility])    TIA (transient ischemic attack) 11/04/2011     Family History  Problem Relation Age of Onset   Hypertension Mother 66   Heart disease Mother    Heart failure Mother 14   Hypertension Brother    Colon cancer Brother 107   Dementia Maternal Grandmother    Breast cancer Cousin    Hyperlipidemia Other    Hypertension Other    Coronary artery disease Other     Past Surgical History:  Procedure Laterality Date   ABDOMINAL AORTOGRAM W/LOWER EXTREMITY N/A 08/27/2020   Procedure: ABDOMINAL AORTOGRAM W/LOWER EXTREMITY;  Surgeon: Elder Negus, MD;  [facility]: [facility];  Service: Cardiovascular;  Laterality: N/A;   BREAST EXCISIONAL BIOPSY Left    BREAST SURGERY  2008 and 2012   x 2 - benign, left side   carotid artery surgery Left    CESAREAN SECTION     x 3   COLONOSCOPY  multiple   2010   PERIPHERAL VASCULAR BALLOON ANGIOPLASTY  08/27/2020   Procedure: PERIPHERAL VASCULAR BALLOON ANGIOPLASTY;  Surgeon: Elder Negus, MD;  [facility]: [facility];  Service: Cardiovascular;;  Right SFA scoring balloon   SHOULDER ARTHROSCOPY WITH SUBACROMIAL DECOMPRESSION, ROTATOR CUFF REPAIR AND BICEP TENDON REPAIR Right 06/08/2023   Procedure: RIGHT SHOULDER ARTHROSCOPY, DEBRIDEMENT, MINI OPEN ROTATOR CUFF TEAR REPAIR AND BICEPS TENODESIS;  Surgeon: Cammy Copa, MD;  Location: [facility];  Service: Orthopedics;  Laterality: Right;   TRANSFORAMINAL LUMBAR INTERBODY FUSION (TLIF) WITH PEDICLE SCREW FIXATION 1 LEVEL Left 07/10/2020   Procedure: LEFT-SIDED LUMBAR FOUR-FIVE TRANSFORAMINAL LUMBAR INTERBODY FUSION WITH INSTRUMENTATION AND ALLOGRAFT;  Surgeon: Estill Bamberg, MD;  Location: [facility];  Service: Orthopedics;  Laterality: Left;   Social History   Occupational History   Occupation: Banker: [employer]  Tobacco Use   Smoking status: Former    Current packs/day: 0.00    Types: Cigarettes    Quit date: 05/31/1979    Years since quitting: 44.3   Smokeless tobacco: Current    Types:  Snuff   Tobacco comments:    social smoker  Vaping Use   Vaping status: Never Used  Substance and Sexual Activity   Alcohol use: Not Currently   Drug use: Not Currently   Sexual activity: Not on file

## 2023-09-30 ENCOUNTER — Telehealth: Payer: Self-pay | Admitting: Orthopedic Surgery

## 2023-09-30 NOTE — Telephone Encounter (Signed)
Patient called stating her arm sling is showing purchased instead of rental. She advised to have someone give her a call back please. [name] [name] [phone]

## 2023-10-01 NOTE — Telephone Encounter (Signed)
Probably referring to the "bionic sling" which is a low cost cpm machine and I think the patients usually keep this and it's not a rental, they dont need to return it

## 2023-10-01 NOTE — Telephone Encounter (Signed)
I called and talked to the pt. She stated she didn't purchase this and is very upset because she received a bill. I told her to reach out to the rep and she stated she did and was told to contact us. She would like Lauren to call her back 1st thing Monday morning or she will show up with the machine. Please call back on return

## 2023-10-04 NOTE — Telephone Encounter (Signed)
Per Gwen Her response, he called patient and could not reach her. He left her a Engineer, technical sales.  He stated she was set up with bionic sling at her sons house the morning after her procedure. He stated her friend Bonita Quin signed her patient agreement due to her inability to sign and left her a copy.  Additionally in her email she received insurance verification. He will follow up with patient as we have nothing to do with billing of [employer] since it is a 3rd party.

## 2023-10-04 NOTE — Telephone Encounter (Signed)
I have messaged Harrold Donath with [employer] to see if he can assist patient.

## 2023-10-12 ENCOUNTER — Ambulatory Visit: Payer: Self-pay | Admitting: Cardiology

## 2023-11-02 ENCOUNTER — Other Ambulatory Visit: Payer: Self-pay | Admitting: Orthopedic Surgery

## 2023-11-22 ENCOUNTER — Other Ambulatory Visit: Payer: Self-pay | Admitting: Family

## 2023-12-10 ENCOUNTER — Ambulatory Visit: Payer: Medicare PPO | Admitting: Cardiology

## 2024-01-09 ENCOUNTER — Encounter: Payer: Self-pay | Admitting: Internal Medicine

## 2024-01-17 ENCOUNTER — Ambulatory Visit: Payer: Medicare PPO | Attending: Cardiology | Admitting: Cardiology

## 2024-01-18 ENCOUNTER — Encounter: Payer: Self-pay | Admitting: Cardiology

## 2024-01-30 ENCOUNTER — Other Ambulatory Visit: Payer: Self-pay | Admitting: Cardiology

## 2024-01-30 ENCOUNTER — Other Ambulatory Visit: Payer: Self-pay | Admitting: Family

## 2024-01-30 DIAGNOSIS — R002 Palpitations: Secondary | ICD-10-CM

## 2024-01-30 DIAGNOSIS — I10 Essential (primary) hypertension: Secondary | ICD-10-CM

## 2024-01-31 ENCOUNTER — Other Ambulatory Visit: Payer: Self-pay

## 2024-01-31 DIAGNOSIS — E7800 Pure hypercholesterolemia, unspecified: Secondary | ICD-10-CM

## 2024-01-31 MED ORDER — EZETIMIBE 10 MG PO TABS
10.0000 mg | ORAL_TABLET | Freq: Every day | ORAL | 0 refills | Status: DC
Start: 2024-01-31 — End: 2024-06-06

## 2024-02-05 ENCOUNTER — Other Ambulatory Visit: Payer: Self-pay | Admitting: Surgical

## 2024-02-05 ENCOUNTER — Other Ambulatory Visit: Payer: Self-pay | Admitting: Sports Medicine

## 2024-02-05 ENCOUNTER — Other Ambulatory Visit: Payer: Self-pay | Admitting: Family

## 2024-02-07 ENCOUNTER — Other Ambulatory Visit: Payer: Self-pay

## 2024-02-07 ENCOUNTER — Telehealth: Payer: Self-pay | Admitting: Family

## 2024-02-07 DIAGNOSIS — I739 Peripheral vascular disease, unspecified: Secondary | ICD-10-CM

## 2024-02-07 DIAGNOSIS — E7800 Pure hypercholesterolemia, unspecified: Secondary | ICD-10-CM

## 2024-02-07 MED ORDER — ROSUVASTATIN CALCIUM 20 MG PO TABS: 20.0000 mg | ORAL_TABLET | Freq: Every day | ORAL | 0 refills | Status: DC

## 2024-02-07 NOTE — Telephone Encounter (Signed)
 She is overdue to have her thyroid level checked. Does she want to schedule for her yearly CPE? This was due in October 2024.

## 2024-02-08 NOTE — Telephone Encounter (Signed)
 Scheduled pt a CPE appointment 02/15/2024.

## 2024-02-15 ENCOUNTER — Ambulatory Visit (INDEPENDENT_AMBULATORY_CARE_PROVIDER_SITE_OTHER): Payer: Medicare PPO | Admitting: Family

## 2024-02-15 ENCOUNTER — Encounter: Payer: Self-pay | Admitting: Family

## 2024-02-15 VITALS — BP 124/64 | HR 68 | Ht 59.0 in | Wt 144.0 lb

## 2024-02-15 DIAGNOSIS — R7309 Other abnormal glucose: Secondary | ICD-10-CM

## 2024-02-15 DIAGNOSIS — E785 Hyperlipidemia, unspecified: Secondary | ICD-10-CM | POA: Diagnosis not present

## 2024-02-15 DIAGNOSIS — Z13820 Encounter for screening for osteoporosis: Secondary | ICD-10-CM

## 2024-02-15 DIAGNOSIS — M79601 Pain in right arm: Secondary | ICD-10-CM

## 2024-02-15 DIAGNOSIS — Z0000 Encounter for general adult medical examination without abnormal findings: Secondary | ICD-10-CM

## 2024-02-15 DIAGNOSIS — Z1211 Encounter for screening for malignant neoplasm of colon: Secondary | ICD-10-CM

## 2024-02-15 DIAGNOSIS — I10 Essential (primary) hypertension: Secondary | ICD-10-CM | POA: Diagnosis not present

## 2024-02-15 DIAGNOSIS — Z860101 Personal history of adenomatous and serrated colon polyps: Secondary | ICD-10-CM

## 2024-02-15 DIAGNOSIS — E039 Hypothyroidism, unspecified: Secondary | ICD-10-CM | POA: Diagnosis not present

## 2024-02-15 MED ORDER — ZOLPIDEM TARTRATE 10 MG PO TABS: 10.0000 mg | ORAL_TABLET | Freq: Every evening | ORAL | 0 refills | Status: DC | PRN

## 2024-02-15 NOTE — Progress Notes (Signed)
 [name] [name] is a 73 y.o. female with the following history as recorded in [employer]:  Patient Active Problem List   Diagnosis Date Noted   Complete tear of right rotator cuff 06/13/2023   Biceps tendonitis on right 06/13/2023   Synovitis of right shoulder 06/13/2023   Claudication in peripheral vascular disease (HCC) 08/26/2020   Neurogenic claudication 07/10/2020   Degenerative lumbar spinal stenosis 05/16/2020   Sacroiliac pain 08/10/2019   Anxiety and depression 05/11/2019   Chest pain 05/09/2019   Arthritis of sacroiliac joint of both sides (HCC) 05/01/2019   Polyarthralgia 12/28/2018   Preventative health care 10/20/2018   RLS (restless legs syndrome) 09/27/2018   Insomnia disorder related to known organic factor 06/27/2018   PLMD (periodic limb movement disorder) 06/27/2018   Hot flashes due to menopause 04/25/2018   Palpitations 04/25/2018   Diaphoresis 04/25/2018   Long term current use of antithrombotics/antiplatelets 04/19/2018   Insomnia 04/19/2018   Intractable episodic headache 02/28/2018   Numbness and tingling of right arm 02/28/2018   Toe pain, right 10/19/2017   Rotator cuff arthropathy of left shoulder 06/02/2017   Trigger thumb of left hand 06/02/2017   Lipoma 05/06/2017   Pain of left thumb 05/06/2017   Chronic pain syndrome 12/15/2016   Lumbar degenerative disc disease 12/15/2016   Left leg pain 01/28/2016   Left shoulder pain 04/09/2015   Impaired glucose tolerance 07/30/2013   Abnormal breath sounds 07/30/2013   Asymptomatic bilateral carotid artery stenosis 05/05/2012   Anxiety 04/03/2011   Cerebral infarction (HCC) 03/15/2011   HLD (hyperlipidemia) 11/03/2010   Hx of adenomatous colonic polyps 02/09/2008   Hypothyroidism 11/06/2007   ALLERGIC RHINITIS 11/06/2007   OSTEOPOROSIS 11/06/2007   ALCOHOL ABUSE, HX OF 11/06/2007   Essential hypertension 09/13/2007   Asthma 09/13/2007   PERIMENOPAUSAL STATUS 09/13/2007   Chronic insomnia 09/13/2007     Current Outpatient Medications  Medication Sig Dispense Refill   acitretin (SORIATANE) 25 MG capsule Take 25 mg by mouth daily.     Calcium Carbonate-Vit D-Min (CALCIUM 1200 PO) Take 1,200 mg by mouth daily.     Cholecalciferol (VITAMIN D) 50 MCG (2000 UT) tablet Take 2,000 Units by mouth daily.     citalopram (CELEXA) 20 MG tablet TAKE 1 TABLET BY MOUTH EVERY DAY 90 tablet 3   clopidogrel (PLAVIX) 75 MG tablet TAKE 1 TABLET BY MOUTH EVERY DAY 90 tablet 3   diltiazem (CARDIZEM CD) 180 MG 24 hr capsule Take 1 capsule (180 mg total) by mouth daily. Pt needs to keep upcoming appt in April for further refills - final attempt 90 capsule 0   ezetimibe (ZETIA) 10 MG tablet Take 1 tablet (10 mg total) by mouth daily. 90 tablet 0   fluticasone-salmeterol (ADVAIR DISKUS) 250-50 MCG/ACT AEPB Inhale 1 puff into the lungs in the morning and at bedtime. 60 each 3   levothyroxine (SYNTHROID) 88 MCG tablet TAKE 1 TABLET BY MOUTH EVERY DAY BEFORE BREAKFAST 90 tablet 0   meloxicam (MOBIC) 15 MG tablet TAKE 1 TABLET (15 MG TOTAL) BY MOUTH DAILY. 30 tablet 0   methocarbamol (ROBAXIN) 500 MG tablet TAKE 1 TABLET (500 MG TOTAL) BY MOUTH EVERY 12 (TWELVE) HOURS AS NEEDED. 30 tablet 0   montelukast (SINGULAIR) 10 MG tablet Take 1 tablet (10 mg total) by mouth daily. 90 tablet 0   Multiple Vitamin (MULTIVITAMIN WITH MINERALS) TABS tablet Take 1 tablet by mouth daily.     Omega-3 Fatty Acids (FISH OIL) 1000 MG CAPS Take 1,000  mg by mouth daily.     oxyCODONE (ROXICODONE) 5 MG immediate release tablet Take 1 tablet (5 mg total) by mouth every 8 (eight) hours as needed for severe pain. 30 tablet 0   pantoprazole (PROTONIX) 40 MG tablet Take 1 tablet (40 mg total) by mouth 2 (two) times daily. (Patient taking differently: Take 40 mg by mouth daily as needed (acid reflux).) 180 tablet 3   rOPINIRole (REQUIP) 0.5 MG tablet TAKE 1 TABLET BY MOUTH AT BEDTIME. 90 tablet 3   rosuvastatin (CRESTOR) 20 MG tablet Take 1 tablet  (20 mg total) by mouth daily. 90 tablet 0   losartan (COZAAR) 25 MG tablet Take 1 tablet (25 mg total) by mouth every evening. 90 tablet 3   zolpidem (AMBIEN) 10 MG tablet Take 1 tablet (10 mg total) by mouth at bedtime as needed. for sleep 90 tablet 0   No current facility-administered medications for this visit.    Allergies: Atorvastatin, Benazepril, Lisinopril, Fosamax [alendronate sodium], Penicillins, and Trazodone and nefazodone  Past Medical History:  Diagnosis Date   Acid reflux    ALCOHOL ABUSE, HX OF 11/06/2007   ALLERGIC RHINITIS 11/06/2007   Anxiety 04/03/2011   ASTHMA 09/13/2007   Asthma    Chronic pain syndrome 12/15/2016   COLONIC POLYPS, HX OF 02/09/2008    ADENOMATOUS POLYP   Coronary artery disease    Encounter for well adult exam without abnormal findings 04/03/2011   HYPERLIPIDEMIA 11/03/2010   HYPERTENSION 09/13/2007   HYPOTHYROIDISM 11/06/2007   Impaired glucose tolerance 07/30/2013   INSOMNIA, HX OF 09/13/2007   Lumbar degenerative disc disease 12/15/2016   OSTEOPOROSIS 11/06/2007   PAD (peripheral artery disease) (HCC)    PERIMENOPAUSAL STATUS 09/13/2007   RLS (restless legs syndrome)    Stroke (HCC)    TIA (transient ischemic attack) 11/04/2011    Past Surgical History:  Procedure Laterality Date   ABDOMINAL AORTOGRAM W/LOWER EXTREMITY N/A 08/27/2020   Procedure: ABDOMINAL AORTOGRAM W/LOWER EXTREMITY;  Surgeon: Elder Negus, MD;  Location: [facility];  Service: Cardiovascular;  Laterality: N/A;   BREAST EXCISIONAL BIOPSY Left    BREAST SURGERY  2008 and 2012   x 2 - benign, left side   carotid artery surgery Left    CESAREAN SECTION     x 3   COLONOSCOPY  multiple   2010   PERIPHERAL VASCULAR BALLOON ANGIOPLASTY  08/27/2020   Procedure: PERIPHERAL VASCULAR BALLOON ANGIOPLASTY;  Surgeon: Elder Negus, MD;  Location: [facility];  Service: Cardiovascular;;  Right SFA scoring balloon   SHOULDER ARTHROSCOPY WITH  SUBACROMIAL DECOMPRESSION, ROTATOR CUFF REPAIR AND BICEP TENDON REPAIR Right 06/08/2023   Procedure: RIGHT SHOULDER ARTHROSCOPY, DEBRIDEMENT, MINI OPEN ROTATOR CUFF TEAR REPAIR AND BICEPS TENODESIS;  Surgeon: Cammy Copa, MD;  Location: [facility];  Service: Orthopedics;  Laterality: Right;   TRANSFORAMINAL LUMBAR INTERBODY FUSION (TLIF) WITH PEDICLE SCREW FIXATION 1 LEVEL Left 07/10/2020   Procedure: LEFT-SIDED LUMBAR FOUR-FIVE TRANSFORAMINAL LUMBAR INTERBODY FUSION WITH INSTRUMENTATION AND ALLOGRAFT;  Surgeon: Estill Bamberg, MD;  Location: [facility];  Service: Orthopedics;  Laterality: Left;    Family History  Problem Relation Age of Onset   Hypertension Mother 42   Heart disease Mother    Heart failure Mother 52   Hypertension Brother    Colon cancer Brother 63   Dementia Maternal Grandmother    Breast cancer Cousin    Hyperlipidemia Other    Hypertension Other    Coronary artery disease Other  Social History   Tobacco Use   Smoking status: Former    Current packs/day: 0.00    Types: Cigarettes    Quit date: 05/31/1979    Years since quitting: 44.7   Smokeless tobacco: Current    Types: Snuff   Tobacco comments:    social smoker  Substance Use Topics   Alcohol use: Not Currently    Subjective:   Presents for yearly CPE;  Dermatology every 6 months- Dr. Yetta Barre dermatology; scheduled to see her cardiologist next month- Dr. Nadara Eaton;  2 day history of sudden onset of right upper arm pain- "feel a knot." Has just gotten permanent dentures and still adjusting;  Has given up tobacco; 3 months without using oral tobacco products;   Review of Systems  Constitutional: Negative.   HENT: Negative.    Eyes: Negative.   Respiratory: Negative.    Cardiovascular: Negative.   Gastrointestinal: Negative.   Genitourinary: Negative.   Musculoskeletal:        Pain in bicep muscle  Skin: Negative.   Neurological: Negative.   Endo/Heme/Allergies: Negative.   Psychiatric/Behavioral:  Negative.        Objective:  Vitals:   02/15/24 1410  BP: 124/64  Pulse: 68  SpO2: 98%  Weight: 144 lb (65.3 kg)  Height: 4\' 11"  (1.499 m)    General: Well developed, well nourished, in no acute distress  Skin : Warm and dry.  Head: Normocephalic and atraumatic  Eyes: Sclera and conjunctiva clear; pupils round and reactive to light; extraocular movements intact  Ears: External normal; canals clear; tympanic membranes normal  Oropharynx: Pink, supple. No suspicious lesions  Neck: Supple without thyromegaly, adenopathy  Lungs: Respirations unlabored; clear to auscultation bilaterally without wheeze, rales, rhonchi  CVS exam: normal rate and regular rhythm.  Abdomen: Soft; nontender; nondistended; normoactive bowel sounds; no masses or hepatosplenomegaly  Musculoskeletal: No deformities; no active joint inflammation  Extremities: No edema, cyanosis, clubbing  Vessels: Symmetric bilaterally  Neurologic: Alert and oriented; speech intact; face symmetrical; moves all extremities well; CNII-XII intact without focal deficit   Assessment:  1. PE (physical exam), annual   2. Essential hypertension   3. Hypothyroidism, unspecified type   4. Hyperlipidemia, unspecified hyperlipidemia type   5. Encounter for colonoscopy due to history of adenomatous colonic polyps   6. Osteoporosis screening   7. Pain of right upper extremity   8. Elevated glucose     Plan:  Age appropriate preventive healthcare needs addressed; encouraged regular eye doctor and dental exams; encouraged regular exercise; will update labs and refills as needed today; follow-up to be determined; Order update for colonoscopy and bone density; she will keep planned follow up with her cardiologist;  Urgent referral to orthopedics due to concerns for possible tear of biceps tendon; she will call back if she has not been contacted to schedule within 24 hours;   No follow-ups on file.  Orders Placed This Encounter  Procedures    DG Bone Density    Standing Status:   Future    Expiration Date:   02/14/2025    Reason for Exam (SYMPTOM  OR DIAGNOSIS REQUIRED):   osteoporosis screening/ ovarian failure    Preferred imaging location?:   [facility]   CBC with Differential/Platelet   Comp Met (CMET)   Lipid panel   TSH   Hemoglobin A1c   Ambulatory referral to Gastroenterology    Referral Priority:   Routine    Referral Type:   Consultation    Referral Reason:  Specialty Services Required    Referred to Provider:   Iva Boop, MD    Number of Visits Requested:   1   Ambulatory referral to Orthopedic Surgery    Referral Priority:   Urgent    Referral Type:   Surgical    Referral Reason:   Specialty Services Required    Referred to Provider:   Cammy Copa, MD    Requested Specialty:   Orthopedic Surgery    Number of Visits Requested:   1    Requested Prescriptions   Signed Prescriptions Disp Refills   zolpidem (AMBIEN) 10 MG tablet 90 tablet 0    Sig: Take 1 tablet (10 mg total) by mouth at bedtime as needed. for sleep

## 2024-02-16 ENCOUNTER — Other Ambulatory Visit: Payer: Self-pay | Admitting: Family

## 2024-02-16 ENCOUNTER — Encounter: Payer: Self-pay | Admitting: Family

## 2024-02-16 LAB — COMPREHENSIVE METABOLIC PANEL
ALT: 14 U/L (ref 0–35)
AST: 18 U/L (ref 0–37)
Albumin: 4.1 g/dL (ref 3.5–5.2)
Alkaline Phosphatase: 107 U/L (ref 39–117)
BUN: 16 mg/dL (ref 6–23)
CO2: 30 meq/L (ref 19–32)
Calcium: 9.3 mg/dL (ref 8.4–10.5)
Chloride: 100 meq/L (ref 96–112)
Creatinine, Ser: 0.87 mg/dL (ref 0.40–1.20)
GFR: 66.85 mL/min (ref >60.00)
Glucose, Bld: 91 mg/dL (ref 70–99)
Potassium: 4.1 meq/L (ref 3.5–5.1)
Sodium: 138 meq/L (ref 135–145)
Total Bilirubin: 0.5 mg/dL (ref 0.2–1.2)
Total Protein: 6.4 g/dL (ref 6.0–8.3)

## 2024-02-16 LAB — CBC WITH DIFFERENTIAL/PLATELET
Basophils Absolute: 0.1 10*3/uL (ref 0.0–0.1)
Basophils Relative: 0.9 % (ref 0.0–3.0)
Eosinophils Absolute: 0.2 10*3/uL (ref 0.0–0.7)
Eosinophils Relative: 2.3 % (ref 0.0–5.0)
HCT: 37.1 % (ref 36.0–46.0)
Hemoglobin: 12.3 g/dL (ref 12.0–15.0)
Lymphocytes Relative: 34.5 % (ref 12.0–46.0)
Lymphs Abs: 2.2 10*3/uL (ref 0.7–4.0)
MCHC: 33.3 g/dL (ref 30.0–36.0)
MCV: 94.8 fl (ref 78.0–100.0)
Monocytes Absolute: 0.8 10*3/uL (ref 0.1–1.0)
Monocytes Relative: 11.8 % (ref 3.0–12.0)
Neutro Abs: 3.3 10*3/uL (ref 1.4–7.7)
Neutrophils Relative %: 50.5 % (ref 43.0–77.0)
Platelets: 205 10*3/uL (ref 150.0–400.0)
RBC: 3.91 Mil/uL (ref 3.87–5.11)
RDW: 15.4 % (ref 11.5–15.5)
WBC: 6.5 10*3/uL (ref 4.0–10.5)

## 2024-02-16 LAB — HEMOGLOBIN A1C: Hgb A1c MFr Bld: 6.2 % (ref 4.6–6.5)

## 2024-02-16 LAB — LIPID PANEL
Cholesterol: 223 mg/dL — ABNORMAL HIGH (ref 0–200)
HDL: 82.8 mg/dL (ref >39.00)
LDL Cholesterol: 115 mg/dL — ABNORMAL HIGH (ref 0–99)
NonHDL: 140.19
Total CHOL/HDL Ratio: 3
Triglycerides: 126 mg/dL (ref 0.0–149.0)
VLDL: 25.2 mg/dL (ref 0.0–40.0)

## 2024-02-16 LAB — TSH: TSH: 2.98 u[IU]/mL (ref 0.35–5.50)

## 2024-02-16 MED ORDER — LEVOTHYROXINE SODIUM 88 MCG PO TABS: ORAL_TABLET | ORAL | 3 refills | Status: AC

## 2024-02-18 ENCOUNTER — Ambulatory Visit: Admitting: Orthopedic Surgery

## 2024-02-18 ENCOUNTER — Encounter: Payer: Self-pay | Admitting: Orthopedic Surgery

## 2024-02-18 ENCOUNTER — Other Ambulatory Visit (INDEPENDENT_AMBULATORY_CARE_PROVIDER_SITE_OTHER): Payer: Self-pay

## 2024-02-18 DIAGNOSIS — M25511 Pain in right shoulder: Secondary | ICD-10-CM | POA: Diagnosis not present

## 2024-02-18 DIAGNOSIS — G8929 Other chronic pain: Secondary | ICD-10-CM

## 2024-02-18 NOTE — Progress Notes (Signed)
 Office Visit Note   Patient: [name] [name]           Date of Birth: [birth year]           MRN: [id number] Visit Date: 02/18/2024 Requested by: [name], FNP [address] [place],  [place] [address] PCP: [name], FNP  Subjective: Chief Complaint  Patient presents with   Right Shoulder - Pain    HPI: [name] [name] is a 73 y.o. female who presents to the office reporting right shoulder pain.  Denies any history of injury.  Is almost a year out from right shoulder rotator cuff tear repair and biceps tenodesis.  Describes pain localized to the biceps region since 4 days ago.  Using Tylenol and muscle relaxer.  She is right-hand dominant..                ROS: All systems reviewed are negative as they relate to the chief complaint within the history of present illness.  Patient denies fevers or chills.  Assessment & Plan: Visit Diagnoses:  1. Chronic right shoulder pain     Plan: Impression is biceps tendon rupture with good rotator cuff strength.  I think this should be a self-limited problem.  6-week return for clinical recheck.  Okay for stretching and activity as tolerated but she will be painful with resisted biceps work for at least 2 to 3 weeks.  No indication for intervention at this time.  Follow-Up Instructions: No follow-ups on file.   Orders:  Orders Placed This Encounter  Procedures   XR Shoulder Right   No orders of the defined types were placed in this encounter.     Procedures: No procedures performed   Clinical Data: No additional findings.  Objective: Vital Signs: There were no vitals taken for this visit.  Physical Exam:  Constitutional: Patient appears well-developed HEENT:  Head: Normocephalic Eyes:EOM are normal Neck: Normal range of motion Cardiovascular: Normal rate Pulmonary/chest: Effort normal Neurologic: Patient is alert Skin: Skin is warm Psychiatric: Patient has normal mood and  affect  Ortho Exam: Ortho exam demonstrates no crepitus with passive range of motion of the right shoulder.  Very good rotator cuff strength to external and internal rotation.  Popeye deformity is present with tenderness.  The deformity is not particularly cosmetically visible.  Specialty Comments:  No specialty comments available.  Imaging: XR Shoulder Right Result Date: 02/18/2024 AP outlet axillary lateral radiographs right shoulder reviewed.  No acute fracture.  Shoulder is located.  Acromiohumeral distance is normal.  No significant degenerative changes in the glenohumeral or AC joint.  Visualized lung fields clear.  Postop changes from prior surgery visible in the humeral head.    PMFS History: Patient Active Problem List   Diagnosis Date Noted   Complete tear of right rotator cuff 06/13/2023   Biceps tendonitis on right 06/13/2023   Synovitis of right shoulder 06/13/2023   Claudication in peripheral vascular disease (HCC) 08/26/2020   Neurogenic claudication 07/10/2020   Degenerative lumbar spinal stenosis 05/16/2020   Sacroiliac pain 08/10/2019   Anxiety and depression 05/11/2019   Chest pain 05/09/2019   Arthritis of sacroiliac joint of both sides (HCC) 05/01/2019   Polyarthralgia 12/28/2018   Preventative health care 10/20/2018   RLS (restless legs syndrome) 09/27/2018   Insomnia disorder related to known organic factor 06/27/2018   PLMD (periodic limb movement disorder) 06/27/2018   Hot flashes due to menopause 04/25/2018   Palpitations 04/25/2018  Diaphoresis 04/25/2018   Long term current use of antithrombotics/antiplatelets 04/19/2018   Insomnia 04/19/2018   Intractable episodic headache 02/28/2018   Numbness and tingling of right arm 02/28/2018   Toe pain, right 10/19/2017   Rotator cuff arthropathy of left shoulder 06/02/2017   Trigger thumb of left hand 06/02/2017   Lipoma 05/06/2017   Pain of left thumb 05/06/2017   Chronic pain syndrome 12/15/2016    Lumbar degenerative disc disease 12/15/2016   Left leg pain 01/28/2016   Left shoulder pain 04/09/2015   Impaired glucose tolerance 07/30/2013   Abnormal breath sounds 07/30/2013   Asymptomatic bilateral carotid artery stenosis 05/05/2012   Anxiety 04/03/2011   Cerebral infarction (HCC) 03/15/2011   HLD (hyperlipidemia) 11/03/2010   Hx of adenomatous colonic polyps 02/09/2008   Hypothyroidism 11/06/2007   ALLERGIC RHINITIS 11/06/2007   OSTEOPOROSIS 11/06/2007   ALCOHOL ABUSE, HX OF 11/06/2007   Essential hypertension 09/13/2007   Asthma 09/13/2007   PERIMENOPAUSAL STATUS 09/13/2007   Chronic insomnia 09/13/2007   Past Medical History:  Diagnosis Date   Acid reflux    ALCOHOL ABUSE, HX OF 11/06/2007   ALLERGIC RHINITIS 11/06/2007   Anxiety 04/03/2011   ASTHMA 09/13/2007   Asthma    Chronic pain syndrome 12/15/2016   COLONIC POLYPS, HX OF 02/09/2008    ADENOMATOUS POLYP   Coronary artery disease    Encounter for well adult exam without abnormal findings 04/03/2011   HYPERLIPIDEMIA 11/03/2010   HYPERTENSION 09/13/2007   HYPOTHYROIDISM 11/06/2007   Impaired glucose tolerance 07/30/2013   INSOMNIA, HX OF 09/13/2007   Lumbar degenerative disc disease 12/15/2016   OSTEOPOROSIS 11/06/2007   PAD (peripheral artery disease) (HCC)    PERIMENOPAUSAL STATUS 09/13/2007   RLS (restless legs syndrome)    Stroke (HCC)    TIA (transient ischemic attack) 11/04/2011    Family History  Problem Relation Age of Onset   Hypertension Mother 74   Heart disease Mother    Heart failure Mother 45   Hypertension Brother    Colon cancer Brother 47   Dementia Maternal Grandmother    Breast cancer Cousin    Hyperlipidemia Other    Hypertension Other    Coronary artery disease Other     Past Surgical History:  Procedure Laterality Date   ABDOMINAL AORTOGRAM W/LOWER EXTREMITY N/A 08/27/2020   Procedure: ABDOMINAL AORTOGRAM W/LOWER EXTREMITY;  Surgeon: Elder Negus, MD;  Location: [facility];  Service: Cardiovascular;  Laterality: N/A;   BREAST EXCISIONAL BIOPSY Left    BREAST SURGERY  2008 and 2012   x 2 - benign, left side   carotid artery surgery Left    CESAREAN SECTION     x 3   COLONOSCOPY  multiple   2010   PERIPHERAL VASCULAR BALLOON ANGIOPLASTY  08/27/2020   Procedure: PERIPHERAL VASCULAR BALLOON ANGIOPLASTY;  Surgeon: Elder Negus, MD;  Location: [facility];  Service: Cardiovascular;;  Right SFA scoring balloon   SHOULDER ARTHROSCOPY WITH SUBACROMIAL DECOMPRESSION, ROTATOR CUFF REPAIR AND BICEP TENDON REPAIR Right 06/08/2023   Procedure: RIGHT SHOULDER ARTHROSCOPY, DEBRIDEMENT, MINI OPEN ROTATOR CUFF TEAR REPAIR AND BICEPS TENODESIS;  Surgeon: Cammy Copa, MD;  Location: [facility];  Service: Orthopedics;  Laterality: Right;   TRANSFORAMINAL LUMBAR INTERBODY FUSION (TLIF) WITH PEDICLE SCREW FIXATION 1 LEVEL Left 07/10/2020   Procedure: LEFT-SIDED LUMBAR FOUR-FIVE TRANSFORAMINAL LUMBAR INTERBODY FUSION WITH INSTRUMENTATION AND ALLOGRAFT;  Surgeon: Estill Bamberg, MD;  Location: [facility];  Service: Orthopedics;  Laterality: Left;  Social History   Occupational History   Occupation: Banker: [employer]  Tobacco Use   Smoking status: Former    Current packs/day: 0.00    Types: Cigarettes    Quit date: 05/31/1979    Years since quitting: 44.7   Smokeless tobacco: Current    Types: Snuff   Tobacco comments:    social smoker  Vaping Use   Vaping status: Never Used  Substance and Sexual Activity   Alcohol use: Not Currently   Drug use: Not Currently   Sexual activity: Not on file

## 2024-02-22 ENCOUNTER — Telehealth: Payer: Self-pay | Admitting: Family

## 2024-02-22 NOTE — Telephone Encounter (Signed)
 Spoke with pt, pt is aware and expressed understanding.

## 2024-02-22 NOTE — Telephone Encounter (Signed)
 Copied from CRM [id number]. Topic: General - Other >> Feb 22, 2024 11:39 AM Sim Boast F wrote: Reason for CRM: Patient called to let Ria Clock know that all of her providers called her to set up a follow up appointment except for her gastroenterologist

## 2024-02-28 ENCOUNTER — Ambulatory Visit: Admitting: Orthopedic Surgery

## 2024-03-06 DIAGNOSIS — L400 Psoriasis vulgaris: Secondary | ICD-10-CM | POA: Diagnosis not present

## 2024-03-07 ENCOUNTER — Other Ambulatory Visit: Payer: Self-pay | Admitting: Sports Medicine

## 2024-03-09 ENCOUNTER — Telehealth (INDEPENDENT_AMBULATORY_CARE_PROVIDER_SITE_OTHER): Admitting: Family

## 2024-03-09 ENCOUNTER — Encounter: Payer: Self-pay | Admitting: Physician Assistant

## 2024-03-09 ENCOUNTER — Encounter: Payer: Self-pay | Admitting: Family

## 2024-03-09 VITALS — Ht 59.0 in

## 2024-03-09 DIAGNOSIS — J019 Acute sinusitis, unspecified: Secondary | ICD-10-CM

## 2024-03-09 DIAGNOSIS — J45909 Unspecified asthma, uncomplicated: Secondary | ICD-10-CM

## 2024-03-09 MED ORDER — PREDNISONE 20 MG PO TABS: ORAL_TABLET | ORAL | 0 refills | Status: DC

## 2024-03-09 MED ORDER — HYDROCODONE BIT-HOMATROP MBR 5-1.5 MG/5ML PO SOLN: 5.0000 mL | Freq: Three times a day (TID) | ORAL | 0 refills | Status: DC | PRN

## 2024-03-09 MED ORDER — AZITHROMYCIN 250 MG PO TABS
ORAL_TABLET | ORAL | 0 refills | Status: DC
Start: 2024-03-09 — End: 2024-04-03

## 2024-03-09 NOTE — Progress Notes (Signed)
 Damon Baisch Lefebre is a 73 y.o. female with the following history as recorded in EpicCare:  Patient Active Problem List   Diagnosis Date Noted   Complete tear of right rotator cuff 06/13/2023   Biceps tendonitis on right 06/13/2023   Synovitis of right shoulder 06/13/2023   Claudication in peripheral vascular disease (HCC) 08/26/2020   Neurogenic claudication 07/10/2020   Degenerative lumbar spinal stenosis 05/16/2020   Sacroiliac pain 08/10/2019   Anxiety and depression 05/11/2019   Chest pain 05/09/2019   Arthritis of sacroiliac joint of both sides (HCC) 05/01/2019   Polyarthralgia 12/28/2018   Preventative health care 10/20/2018   RLS (restless legs syndrome) 09/27/2018   Insomnia disorder related to known organic factor 06/27/2018   PLMD (periodic limb movement disorder) 06/27/2018   Hot flashes due to menopause 04/25/2018   Palpitations 04/25/2018   Diaphoresis 04/25/2018   Long term current use of antithrombotics/antiplatelets 04/19/2018   Insomnia 04/19/2018   Intractable episodic headache 02/28/2018   Numbness and tingling of right arm 02/28/2018   Toe pain, right 10/19/2017   Rotator cuff arthropathy of left shoulder 06/02/2017   Trigger thumb of left hand 06/02/2017   Lipoma 05/06/2017   Pain of left thumb 05/06/2017   Chronic pain syndrome 12/15/2016   Lumbar degenerative disc disease 12/15/2016   Left leg pain 01/28/2016   Left shoulder pain 04/09/2015   Impaired glucose tolerance 07/30/2013   Abnormal breath sounds 07/30/2013   Asymptomatic bilateral carotid artery stenosis 05/05/2012   Anxiety 04/03/2011   Cerebral infarction (HCC) 03/15/2011   HLD (hyperlipidemia) 11/03/2010   Hx of adenomatous colonic polyps 02/09/2008   Hypothyroidism 11/06/2007   ALLERGIC RHINITIS 11/06/2007   OSTEOPOROSIS 11/06/2007   ALCOHOL ABUSE, HX OF 11/06/2007   Essential hypertension 09/13/2007   Asthma 09/13/2007   PERIMENOPAUSAL STATUS 09/13/2007   Chronic insomnia 09/13/2007     Current Outpatient Medications  Medication Sig Dispense Refill   acitretin (SORIATANE) 25 MG capsule Take 25 mg by mouth daily.     azithromycin (ZITHROMAX Z-PAK) 250 MG tablet Take 2 tablets (500 mg) PO today, then 1 tablet (250 mg) PO daily x4 days. 6 tablet 0   Calcium Carbonate-Vit D-Min (CALCIUM 1200 PO) Take 1,200 mg by mouth daily.     Cholecalciferol (VITAMIN D) 50 MCG (2000 UT) tablet Take 2,000 Units by mouth daily.     citalopram (CELEXA) 20 MG tablet TAKE 1 TABLET BY MOUTH EVERY DAY 90 tablet 3   clopidogrel (PLAVIX) 75 MG tablet TAKE 1 TABLET BY MOUTH EVERY DAY 90 tablet 3   diltiazem (CARDIZEM CD) 180 MG 24 hr capsule Take 1 capsule (180 mg total) by mouth daily. Pt needs to keep upcoming appt in April for further refills - final attempt 90 capsule 0   ezetimibe (ZETIA) 10 MG tablet Take 1 tablet (10 mg total) by mouth daily. 90 tablet 0   fluticasone-salmeterol (ADVAIR DISKUS) 250-50 MCG/ACT AEPB Inhale 1 puff into the lungs in the morning and at bedtime. 60 each 3   HYDROcodone bit-homatropine (HYCODAN) 5-1.5 MG/5ML syrup Take 5 mLs by mouth every 8 (eight) hours as needed for cough. 120 mL 0   levothyroxine (SYNTHROID) 88 MCG tablet TAKE 1 TABLET BY MOUTH EVERY DAY BEFORE BREAKFAST 90 tablet 3   meloxicam (MOBIC) 15 MG tablet TAKE 1 TABLET (15 MG TOTAL) BY MOUTH DAILY. 30 tablet 0   methocarbamol (ROBAXIN) 500 MG tablet TAKE 1 TABLET (500 MG TOTAL) BY MOUTH EVERY 12 (TWELVE) HOURS AS NEEDED.  30 tablet 0   montelukast (SINGULAIR) 10 MG tablet Take 1 tablet (10 mg total) by mouth daily. 90 tablet 0   Multiple Vitamin (MULTIVITAMIN WITH MINERALS) TABS tablet Take 1 tablet by mouth daily.     Omega-3 Fatty Acids (FISH OIL) 1000 MG CAPS Take 1,000 mg by mouth daily.     pantoprazole (PROTONIX) 40 MG tablet Take 1 tablet (40 mg total) by mouth 2 (two) times daily. (Patient taking differently: Take 40 mg by mouth daily as needed (acid reflux).) 180 tablet 3   predniSONE (DELTASONE)  20 MG tablet Take 2 tablets po qd x 2 days, then 1 tablet po every day x 5 days; 9 tablet 0   rOPINIRole (REQUIP) 0.5 MG tablet TAKE 1 TABLET BY MOUTH AT BEDTIME. 90 tablet 3   rosuvastatin (CRESTOR) 20 MG tablet Take 1 tablet (20 mg total) by mouth daily. 90 tablet 0   zolpidem (AMBIEN) 10 MG tablet Take 1 tablet (10 mg total) by mouth at bedtime as needed. for sleep 90 tablet 0   losartan (COZAAR) 25 MG tablet Take 1 tablet (25 mg total) by mouth every evening. 90 tablet 3   No current facility-administered medications for this visit.    Allergies: Atorvastatin, Benazepril, Lisinopril, Fosamax [alendronate sodium], Penicillins, and Trazodone and nefazodone  Past Medical History:  Diagnosis Date   Acid reflux    ALCOHOL ABUSE, HX OF 11/06/2007   ALLERGIC RHINITIS 11/06/2007   Anxiety 04/03/2011   ASTHMA 09/13/2007   Asthma    Chronic pain syndrome 12/15/2016   COLONIC POLYPS, HX OF 02/09/2008    ADENOMATOUS POLYP   Coronary artery disease    Encounter for well adult exam without abnormal findings 04/03/2011   HYPERLIPIDEMIA 11/03/2010   HYPERTENSION 09/13/2007   HYPOTHYROIDISM 11/06/2007   Impaired glucose tolerance 07/30/2013   INSOMNIA, HX OF 09/13/2007   Lumbar degenerative disc disease 12/15/2016   OSTEOPOROSIS 11/06/2007   PAD (peripheral artery disease) (HCC)    PERIMENOPAUSAL STATUS 09/13/2007   RLS (restless legs syndrome)    Stroke (HCC)    TIA (transient ischemic attack) 11/04/2011    Past Surgical History:  Procedure Laterality Date   ABDOMINAL AORTOGRAM W/LOWER EXTREMITY N/A 08/27/2020   Procedure: ABDOMINAL AORTOGRAM W/LOWER EXTREMITY;  Surgeon: Elder Negus, MD;  Location: [facility];  Service: Cardiovascular;  Laterality: N/A;   BREAST EXCISIONAL BIOPSY Left    BREAST SURGERY  2008 and 2012   x 2 - benign, left side   carotid artery surgery Left    CESAREAN SECTION     x 3   COLONOSCOPY  multiple   2010   PERIPHERAL VASCULAR BALLOON  ANGIOPLASTY  08/27/2020   Procedure: PERIPHERAL VASCULAR BALLOON ANGIOPLASTY;  Surgeon: Elder Negus, MD;  Location: [facility];  Service: Cardiovascular;;  Right SFA scoring balloon   SHOULDER ARTHROSCOPY WITH SUBACROMIAL DECOMPRESSION, ROTATOR CUFF REPAIR AND BICEP TENDON REPAIR Right 06/08/2023   Procedure: RIGHT SHOULDER ARTHROSCOPY, DEBRIDEMENT, MINI OPEN ROTATOR CUFF TEAR REPAIR AND BICEPS TENODESIS;  Surgeon: Cammy Copa, MD;  Location: [facility];  Service: Orthopedics;  Laterality: Right;   TRANSFORAMINAL LUMBAR INTERBODY FUSION (TLIF) WITH PEDICLE SCREW FIXATION 1 LEVEL Left 07/10/2020   Procedure: LEFT-SIDED LUMBAR FOUR-FIVE TRANSFORAMINAL LUMBAR INTERBODY FUSION WITH INSTRUMENTATION AND ALLOGRAFT;  Surgeon: Estill Bamberg, MD;  Location: [facility];  Service: Orthopedics;  Laterality: Left;    Family History  Problem Relation Age of Onset   Hypertension Mother 78   Heart  disease Mother    Heart failure Mother 74   Hypertension Brother    Colon cancer Brother 50   Dementia Maternal Grandmother    Breast cancer Cousin    Hyperlipidemia Other    Hypertension Other    Coronary artery disease Other     Social History   Tobacco Use   Smoking status: Former    Current packs/day: 0.00    Types: Cigarettes    Quit date: 05/31/1979    Years since quitting: 44.8   Smokeless tobacco: Current    Types: Snuff   Tobacco comments:    social smoker  Substance Use Topics   Alcohol use: Not Currently    Subjective:     I connected with Mirabella B Canaday on 03/09/24 at  3:20 PM EDT by a video enabled telemedicine application and verified that I am speaking with the correct person using two identifiers.   I discussed the limitations of evaluation and management by telemedicine and the availability of in person appointments. The patient expressed understanding and agreed to proceed. Provider in office/ patient is at home; provider and patient are only 2 people on video call.    Concerned for coughing/ possible sinus infection; does feel that asthma is causing her more problems; no fever; no chest pain or shortness of breath;      Objective:  Vitals:   03/09/24 1529  Height: 4\' 11"  (1.499 m)    General: Well developed, well nourished, in no acute distress  Skin : Warm and dry.  Head: Normocephalic and atraumatic  Lungs: Respirations unlabored;  Neurologic: Alert and oriented; speech intact; face symmetrical;   1. Acute asthmatic bronchitis   2. Acute sinusitis, recurrence not specified, unspecified location     Plan:  Rx for Z-pak #1 take as directed; Rx for Prednisone- take as directed; Rx for Hycodan cough syrup to use at night; increase fluids, rest and follow up worse, no better.   No follow-ups on file.  No orders of the defined types were placed in this encounter.   Requested Prescriptions   Signed Prescriptions Disp Refills   azithromycin (ZITHROMAX Z-PAK) 250 MG tablet 6 tablet 0    Sig: Take 2 tablets (500 mg) PO today, then 1 tablet (250 mg) PO daily x4 days.   HYDROcodone bit-homatropine (HYCODAN) 5-1.5 MG/5ML syrup 120 mL 0    Sig: Take 5 mLs by mouth every 8 (eight) hours as needed for cough.   predniSONE (DELTASONE) 20 MG tablet 9 tablet 0    Sig: Take 2 tablets po qd x 2 days, then 1 tablet po every day x 5 days;

## 2024-03-20 ENCOUNTER — Telehealth: Payer: Self-pay

## 2024-03-20 NOTE — Telephone Encounter (Signed)
 Copied from CRM [id number]. Topic: Clinical - Medical Advice >> Mar 20, 2024  3:10 PM Antwanette L wrote: Reason for CRM: The patient wanted to let Ria Clock that the azithromycin (ZITHROMAX Z-PAK) 250 MG tablet worked.

## 2024-03-21 NOTE — Telephone Encounter (Signed)
 Called pt was not able to leave a VM due to mailbox being full.

## 2024-03-22 NOTE — Telephone Encounter (Signed)
 My chart message sent to pt.

## 2024-03-30 ENCOUNTER — Ambulatory Visit: Admitting: Orthopedic Surgery

## 2024-04-03 ENCOUNTER — Ambulatory Visit: Payer: Medicare PPO | Attending: Cardiology | Admitting: Cardiology

## 2024-04-03 ENCOUNTER — Encounter: Payer: Self-pay | Admitting: Cardiology

## 2024-04-03 VITALS — BP 139/78 | HR 72 | Ht 59.0 in | Wt 148.0 lb

## 2024-04-03 DIAGNOSIS — I10 Essential (primary) hypertension: Secondary | ICD-10-CM | POA: Diagnosis not present

## 2024-04-03 DIAGNOSIS — E7800 Pure hypercholesterolemia, unspecified: Secondary | ICD-10-CM | POA: Diagnosis not present

## 2024-04-03 DIAGNOSIS — I6523 Occlusion and stenosis of bilateral carotid arteries: Secondary | ICD-10-CM

## 2024-04-03 DIAGNOSIS — Z9889 Other specified postprocedural states: Secondary | ICD-10-CM | POA: Diagnosis not present

## 2024-04-03 DIAGNOSIS — I739 Peripheral vascular disease, unspecified: Secondary | ICD-10-CM | POA: Diagnosis not present

## 2024-04-03 NOTE — Progress Notes (Signed)
 Cardiology Office Note:  .   Date:  04/03/2024  ID:  [name] [name], DOB December 02, 1951, MRN [id number] PCP: Adra Alanis, FNP  [facility] Providers Cardiologist:  Knox Perl, MD   History of Present Illness: .    Discussed the use of AI scribe software for clinical note transcription with the patient, who gave verbal consent to proceed.  History of Present Illness The patient, with a history of carotid artery stenosis and peripheral arterial disease, presents with multiple health concerns. She reports recent weight gain, which she attributes to quitting tobacco use five months ago. She expresses difficulty with cravings but remains committed to cessation. She also expresses concern about memory issues, noting short-term memory problems and a family history of dementia. She reports experiencing leg pain, specifically "charley horses," which she notes increase with walking. The patient also mentions a recent increase in cholesterol levels, despite being on medication for this issue.  [name] [name] is a 73 y.o. AA female  with history of left carotid endarterectomy due to symptomatic carotid stenosis in 2012 with TIA, asymptomatic right carotid stenosis, PAD with history of right SFA angioplasty, bronchial asthma, hypertension, hyperlipidemia, hyperglycemia, she is a prior smoker then switched to chewing tobacco but she has quit chewing tobacco since January 2025.  I have congratulated her on that.  As a part of her cardiac workup, she has had a negative Lexiscan  nuclear stress test in 2023 with normal LVEF.  She presents for annual visit.  Labs   Lab Results  Component Value Date   CHOL 223 (H) 02/15/2024   HDL 82.80 02/15/2024   LDLCALC 115 (H) 02/15/2024   LDLDIRECT 72 10/17/2019   TRIG 126.0 02/15/2024   CHOLHDL 3 02/15/2024       10/13/22 1518  CHOL 147  TRIG 85.0  LDLCALC 76  VLDL 17.0  HDL 54.40  CHOLHDL 3   Lab Results  Component Value Date   NA 138  02/15/2024   K 4.1 02/15/2024   CO2 30 02/15/2024   GLUCOSE 91 02/15/2024   BUN 16 02/15/2024   CREATININE 0.87 02/15/2024   CALCIUM  9.3 02/15/2024   GFR 66.85 02/15/2024   EGFR 45 (L) 06/17/2021   GFRNONAA >60 05/31/2023      Latest Ref Rng & Units 02/15/2024    2:58 PM 05/31/2023    3:20 PM 11/17/2022   12:12 PM  BMP  Glucose 70 - 99 mg/dL 91  657  846   BUN 6 - 23 mg/dL 16  14  21    Creatinine 0.40 - 1.20 mg/dL 9.62  9.52  8.41   Sodium 135 - 145 mEq/L 138  135  137   Potassium 3.5 - 5.1 mEq/L 4.1  3.8  4.3   Chloride 96 - 112 mEq/L 100  102  99   CO2 19 - 32 mEq/L 30  23  29    Calcium  8.4 - 10.5 mg/dL 9.3  9.7  9.5       Latest Ref Rng & Units 02/15/2024    2:58 PM 05/31/2023    3:20 PM 10/13/2022    3:18 PM  CBC  WBC 4.0 - 10.5 K/uL 6.5  6.6  6.5   Hemoglobin 12.0 - 15.0 g/dL 32.4  40.1  02.7   Hematocrit 36.0 - 46.0 % 37.1  38.7  37.1   Platelets 150.0 - 400.0 K/uL 205.0  233  239.0    Lab Results  Component Value Date   HGBA1C 6.2  02/15/2024    Lab Results  Component Value Date   TSH 2.98 02/15/2024   Review of Systems  Cardiovascular:  Positive for claudication (right leg). Negative for chest pain, dyspnea on exertion and leg swelling.   Physical Exam:   VS:  BP (!) 150/82   Pulse 72   Ht 4\' 11"  (1.499 m)   Wt 148 lb (67.1 kg)   SpO2 96%   BMI 29.89 kg/m    Wt Readings from Last 3 Encounters:  04/03/24 148 lb (67.1 kg)  02/15/24 144 lb (65.3 kg)  08/26/23 135 lb (61.2 kg)     Physical Exam Neck:     Vascular: Carotid bruit (right) present. No JVD.  Cardiovascular:     Rate and Rhythm: Normal rate and regular rhythm.     Pulses: Intact distal pulses.          Dorsalis pedis pulses are 0 on the right side and 1+ on the left side.       Posterior tibial pulses are 0 on the right side and 1+ on the left side.     Heart sounds: Normal heart sounds. No murmur heard.    No gallop.  Pulmonary:     Effort: Pulmonary effort is normal.     Breath  sounds: Normal breath sounds.  Abdominal:     General: Bowel sounds are normal.     Palpations: Abdomen is soft.  Musculoskeletal:     Right lower leg: No edema.     Left lower leg: No edema.     Studies Reviewed: .    Carotid artery duplex 03/16/2023: Duplex suggests stenosis in the right internal carotid artery (16-49%). There is a patent endarterectomy site starting at the left carotid ICA-prox extending into the ICA-mid.  <50% stenosis in the left common carotid vessels. Antegrade right vertebral artery flow. Antegrade left vertebral artery flow. No significant change from 03/04/2022. Follow up in one year is appropriate if clinically indicated.  EKG:    EKG Interpretation Date/Time:  Monday April 03 2024 15:27:09 EDT Ventricular Rate:  72 PR Interval:  116 QRS Duration:  86 QT Interval:  408 QTC Calculation: 446 R Axis:   77  Text Interpretation: EKG 04/03/2024: Normal sinus rhythm at rate of 72 bpm, normal EKG.  No change from 05/10/2019. Confirmed by Shin Lamour, Jagadeesh ([id number]) on 04/03/2024 3:42:14 PM    Medications and allergies    Allergies  Allergen Reactions   Atorvastatin  Other (See Comments)    Arthralgia and myalgia   Benazepril  Swelling   Lisinopril  Cough   Fosamax [Alendronate Sodium] Nausea And Vomiting   Penicillins Hives    Did it involve swelling of the face/tongue/throat, SOB, or low BP? No Did it involve sudden or severe rash/hives, skin peeling, or any reaction on the inside of your mouth or nose? Yes Did you need to seek medical attention at a hospital or doctor's office? Yes When did it last happen? Over 10 years ago If all above answers are "NO", may proceed with cephalosporin use.   Trazodone  And Nefazodone     Pt is unsure of reaction, possibly made her feel spaced out?     Current Outpatient Medications:    acitretin (SORIATANE) 25 MG capsule, Take 25 mg by mouth daily., Disp: , Rfl:    Calcium  Carbonate-Vit D-Min (CALCIUM  1200 PO), Take  1,200 mg by mouth daily., Disp: , Rfl:    Cholecalciferol  (VITAMIN D ) 50 MCG (2000 UT) tablet, Take 2,000 Units by  mouth daily., Disp: , Rfl:    citalopram  (CELEXA ) 20 MG tablet, TAKE 1 TABLET BY MOUTH EVERY DAY, Disp: 90 tablet, Rfl: 3   clopidogrel  (PLAVIX ) 75 MG tablet, TAKE 1 TABLET BY MOUTH EVERY DAY, Disp: 90 tablet, Rfl: 3   diltiazem  (CARDIZEM  CD) 180 MG 24 hr capsule, Take 1 capsule (180 mg total) by mouth daily. Pt needs to keep upcoming appt in April for further refills - final attempt, Disp: 90 capsule, Rfl: 0   ezetimibe  (ZETIA ) 10 MG tablet, Take 1 tablet (10 mg total) by mouth daily., Disp: 90 tablet, Rfl: 0   fluticasone -salmeterol (ADVAIR DISKUS) 250-50 MCG/ACT AEPB, Inhale 1 puff into the lungs in the morning and at bedtime., Disp: 60 each, Rfl: 3   HYDROcodone  bit-homatropine (HYCODAN) 5-1.5 MG/5ML syrup, Take 5 mLs by mouth every 8 (eight) hours as needed for cough., Disp: 120 mL, Rfl: 0   levothyroxine  (SYNTHROID ) 88 MCG tablet, TAKE 1 TABLET BY MOUTH EVERY DAY BEFORE BREAKFAST, Disp: 90 tablet, Rfl: 3   meloxicam  (MOBIC ) 15 MG tablet, TAKE 1 TABLET (15 MG TOTAL) BY MOUTH DAILY., Disp: 30 tablet, Rfl: 0   methocarbamol  (ROBAXIN ) 500 MG tablet, TAKE 1 TABLET (500 MG TOTAL) BY MOUTH EVERY 12 (TWELVE) HOURS AS NEEDED., Disp: 30 tablet, Rfl: 0   montelukast  (SINGULAIR ) 10 MG tablet, Take 1 tablet (10 mg total) by mouth daily., Disp: 90 tablet, Rfl: 0   Multiple Vitamin (MULTIVITAMIN WITH MINERALS) TABS tablet, Take 1 tablet by mouth daily., Disp: , Rfl:    Omega-3 Fatty Acids (FISH OIL) 1000 MG CAPS, Take 1,000 mg by mouth daily., Disp: , Rfl:    pantoprazole  (PROTONIX ) 40 MG tablet, Take 1 tablet (40 mg total) by mouth 2 (two) times daily. (Patient taking differently: Take 40 mg by mouth daily as needed (acid reflux).), Disp: 180 tablet, Rfl: 3   rOPINIRole  (REQUIP ) 0.5 MG tablet, TAKE 1 TABLET BY MOUTH AT BEDTIME., Disp: 90 tablet, Rfl: 3   rosuvastatin  (CRESTOR ) 20 MG tablet, Take  1 tablet (20 mg total) by mouth daily., Disp: 90 tablet, Rfl: 0   zolpidem  (AMBIEN ) 10 MG tablet, Take 1 tablet (10 mg total) by mouth at bedtime as needed. for sleep, Disp: 90 tablet, Rfl: 0   losartan  (COZAAR ) 25 MG tablet, Take 1 tablet (25 mg total) by mouth every evening., Disp: 90 tablet, Rfl: 3   No orders of the defined types were placed in this encounter.    Medications Discontinued During This Encounter  Medication Reason   azithromycin  (ZITHROMAX  Z-PAK) 250 MG tablet Completed Course   predniSONE  (DELTASONE ) 20 MG tablet Completed Course     ASSESSMENT AND PLAN: .      ICD-10-CM   1. Asymptomatic bilateral carotid artery stenosis  I65.23 PCV CAROTID DUPLEX (BILATERAL)    EKG 12-Lead    2. History of left-sided carotid endarterectomy  Z98.890     3. Claudication in peripheral vascular disease (HCC)  I73.9     4. Essential hypertension  I10 EKG 12-Lead      Assessment and Plan Assessment & Plan Carotid Artery Stenosis   She is post-carotid endarterectomy on the left side with moderate stenosis on the right. No recent imaging has been done to evaluate the current status. Order a carotid artery duplex ultrasound to reassess stenosis.  Peripheral Arterial Disease with Claudication   Claudication in the right leg is improving with increased walking and is managed with clopidogrel  (Plavix ). Encourage regular walking to improve circulation and  reduce claudication symptoms. Continue clopidogrel  therapy. Reassess if claudication symptoms worsen or walking becomes significantly limited.  Hypertension   Blood pressure is approximately 139/140 mmHg, with a goal of 130/80 mmHg or less. Recent weight gain and tobacco cessation may affect control. Monitor blood pressure at home, aiming for 130/80 mmHg or less. Encourage weight loss to aid in blood pressure reduction. Reassess if blood pressure remains elevated.  Hyperlipidemia   Cholesterol levels have increased from previously  well-controlled levels, with current LDL at 115 mg/dL, above the target of less than 70 mg/dL. This increase is unexpected given the stable medication regimen and recent tobacco cessation. Recheck cholesterol levels in 6-8 weeks. Encourage dietary changes by reducing red meat, fried foods, and pork intake. Consider increasing rosuvastatin  to 40 mg if cholesterol remains elevated after dietary changes. Discuss the potential use of injectable cholesterol-lowering medication if needed.   Signed,  Knox Perl, MD, [facility] 04/03/2024, 3:44 PM [facility] [address] [place], [place] [address] Phone: [phone]. Fax:  [phone]

## 2024-04-03 NOTE — Patient Instructions (Signed)
 Medication Instructions:  Your physician recommends that you continue on your current medications as directed. Please refer to the Current Medication list given to you today.  *If you need a refill on your cardiac medications before your next appointment, please call your pharmacy*  Lab Work: Have fasting lab work (lipid profile) checked in 6-8 weeks.  Can be done at any [facility] location If you have labs (blood work) drawn today and your tests are completely normal, you will receive your results only by: MyChart Message (if you have MyChart) OR A paper copy in the mail If you have any lab test that is abnormal or we need to change your treatment, we will call you to review the results.  Testing/Procedures: Your physician has requested that you have a carotid duplex. This test is an ultrasound of the carotid arteries in your neck. It looks at blood flow through these arteries that supply the brain with blood. Allow one hour for this exam. There are no restrictions or special instructions.   Follow-Up: At [facility], you and your health needs are our priority.  As part of our continuing mission to provide you with exceptional heart care, our providers are all part of one team.  This team includes your primary Cardiologist (physician) and Advanced Practice Providers or APPs (Physician Assistants and Nurse Practitioners) who all work together to provide you with the care you need, when you need it.  Your next appointment:   12 month(s)  Provider:   Knox Perl, MD     We recommend signing up for the patient portal called "MyChart".  Sign up information is provided on this After Visit Summary.  MyChart is used to connect with patients for Virtual Visits (Telemedicine).  Patients are able to view lab/test results, encounter notes, upcoming appointments, etc.  Non-urgent messages can be sent to your provider as well.   To learn more about what you can do with MyChart, go to  [email or web address].   Other Instructions You may go to any of these [facility] locations:   [facility] - [address] ([facility]) - [address] [phone] [address]   [place] - [address]    [place]  - [address]    [place] - [address] - [address] [employer]  - [address] ([facility])  [place]   - [address]       1st Floor: - Lobby - Registration  - Pharmacy  - Lab - Cafe  2nd Floor: - PV Lab - Diagnostic Testing (echo, CT, nuclear med)  3rd Floor: - Vacant  4th Floor: - TCTS (cardiothoracic surgery) - AFib Clinic - Structural Heart Clinic - Vascular Surgery  - Vascular Ultrasound  5th Floor: - [facility]Care Cardiology (general and EP) - Clinical Pharmacy for coumadin, hypertension, lipid, weight-loss medications, and med management appointments    Valet parking services will be available as well.

## 2024-04-09 ENCOUNTER — Other Ambulatory Visit: Payer: Self-pay | Admitting: Family

## 2024-04-18 ENCOUNTER — Encounter: Payer: Self-pay | Admitting: Family

## 2024-04-18 ENCOUNTER — Telehealth (INDEPENDENT_AMBULATORY_CARE_PROVIDER_SITE_OTHER): Admitting: Family

## 2024-04-18 VITALS — Ht 59.0 in

## 2024-04-18 DIAGNOSIS — R413 Other amnesia: Secondary | ICD-10-CM

## 2024-04-18 DIAGNOSIS — E559 Vitamin D deficiency, unspecified: Secondary | ICD-10-CM

## 2024-04-18 DIAGNOSIS — E039 Hypothyroidism, unspecified: Secondary | ICD-10-CM

## 2024-04-18 NOTE — Progress Notes (Signed)
 [name] [name] is a 73 y.o. female with the following history as recorded in Epic[employer]:  Patient Active Problem List   Diagnosis Date Noted   Complete tear of right rotator cuff 06/13/2023   Biceps tendonitis on right 06/13/2023   Synovitis of right shoulder 06/13/2023   Claudication in peripheral vascular disease (HCC) 08/26/2020   Neurogenic claudication 07/10/2020   Degenerative lumbar spinal stenosis 05/16/2020   Sacroiliac pain 08/10/2019   Anxiety and depression 05/11/2019   Chest pain 05/09/2019   Arthritis of sacroiliac joint of both sides (HCC) 05/01/2019   Polyarthralgia 12/28/2018   Preventative health care 10/20/2018   RLS (restless legs syndrome) 09/27/2018   Insomnia disorder related to known organic factor 06/27/2018   PLMD (periodic limb movement disorder) 06/27/2018   Hot flashes due to menopause 04/25/2018   Palpitations 04/25/2018   Diaphoresis 04/25/2018   Long term current use of antithrombotics/antiplatelets 04/19/2018   Insomnia 04/19/2018   Intractable episodic headache 02/28/2018   Numbness and tingling of right arm 02/28/2018   Toe pain, right 10/19/2017   Rotator cuff arthropathy of left shoulder 06/02/2017   Trigger thumb of left hand 06/02/2017   Lipoma 05/06/2017   Pain of left thumb 05/06/2017   Chronic pain syndrome 12/15/2016   Lumbar degenerative disc disease 12/15/2016   Left leg pain 01/28/2016   Left shoulder pain 04/09/2015   Impaired glucose tolerance 07/30/2013   Abnormal breath sounds 07/30/2013   Asymptomatic bilateral carotid artery stenosis 05/05/2012   Anxiety 04/03/2011   Cerebral infarction (HCC) 03/15/2011   HLD (hyperlipidemia) 11/03/2010   Hx of adenomatous colonic polyps 02/09/2008   Hypothyroidism 11/06/2007   ALLERGIC RHINITIS 11/06/2007   OSTEOPOROSIS 11/06/2007   ALCOHOL ABUSE, HX OF 11/06/2007   Essential hypertension 09/13/2007   Asthma 09/13/2007   PERIMENOPAUSAL STATUS 09/13/2007   Chronic insomnia 09/13/2007     Current Outpatient Medications  Medication Sig Dispense Refill   acitretin (SORIATANE) 25 MG capsule Take 25 mg by mouth daily.     Calcium  Carbonate-Vit D-Min (CALCIUM  1200 PO) Take 1,200 mg by mouth daily.     Cholecalciferol  (VITAMIN D ) 50 MCG (2000 UT) tablet Take 2,000 Units by mouth daily.     citalopram  (CELEXA ) 20 MG tablet TAKE 1 TABLET BY MOUTH EVERY DAY 90 tablet 3   clopidogrel  (PLAVIX ) 75 MG tablet TAKE 1 TABLET BY MOUTH EVERY DAY 90 tablet 3   diltiazem  (CARDIZEM  CD) 180 MG 24 hr capsule Take 1 capsule (180 mg total) by mouth daily. Pt needs to keep upcoming appt in April for further refills - final attempt 90 capsule 0   ezetimibe  (ZETIA ) 10 MG tablet Take 1 tablet (10 mg total) by mouth daily. 90 tablet 0   fluticasone -salmeterol (ADVAIR DISKUS) 250-50 MCG/ACT AEPB Inhale 1 puff into the lungs in the morning and at bedtime. 60 each 3   levothyroxine  (SYNTHROID ) 88 MCG tablet TAKE 1 TABLET BY MOUTH EVERY DAY BEFORE BREAKFAST 90 tablet 3   losartan  (COZAAR ) 25 MG tablet Take 1 tablet (25 mg total) by mouth every evening. 90 tablet 3   meloxicam  (MOBIC ) 15 MG tablet TAKE 1 TABLET (15 MG TOTAL) BY MOUTH DAILY. 30 tablet 0   methocarbamol  (ROBAXIN ) 500 MG tablet TAKE 1 TABLET (500 MG TOTAL) BY MOUTH EVERY 12 (TWELVE) HOURS AS NEEDED. 30 tablet 0   montelukast  (SINGULAIR ) 10 MG tablet Take 1 tablet (10 mg total) by mouth daily. 90 tablet 0   Multiple Vitamin (MULTIVITAMIN WITH MINERALS) TABS tablet  Take 1 tablet by mouth daily.     Omega-3 Fatty Acids (FISH OIL) 1000 MG CAPS Take 1,000 mg by mouth daily.     pantoprazole  (PROTONIX ) 40 MG tablet Take 1 tablet (40 mg total) by mouth 2 (two) times daily. (Patient taking differently: Take 40 mg by mouth daily as needed (acid reflux).) 180 tablet 3   rOPINIRole  (REQUIP ) 0.5 MG tablet TAKE 1 TABLET BY MOUTH AT BEDTIME. 90 tablet 3   rosuvastatin  (CRESTOR ) 20 MG tablet Take 1 tablet (20 mg total) by mouth daily. 90 tablet 0   zolpidem   (AMBIEN ) 10 MG tablet Take 1 tablet (10 mg total) by mouth at bedtime as needed. for sleep 90 tablet 0   No current facility-administered medications for this visit.    Allergies: Atorvastatin , Benazepril , Lisinopril , Fosamax [alendronate sodium], Penicillins, and Trazodone  and nefazodone  Past Medical History:  Diagnosis Date   Acid reflux    ALCOHOL ABUSE, HX OF 11/06/2007   ALLERGIC RHINITIS 11/06/2007   Anxiety 04/03/2011   ASTHMA 09/13/2007   Asthma    Chronic pain syndrome 12/15/2016   COLONIC POLYPS, HX OF 02/09/2008    ADENOMATOUS POLYP   Coronary artery disease    Encounter for well adult exam without abnormal findings 04/03/2011   HYPERLIPIDEMIA 11/03/2010   HYPERTENSION 09/13/2007   HYPOTHYROIDISM 11/06/2007   Impaired glucose tolerance 07/30/2013   INSOMNIA, HX OF 09/13/2007   Lumbar degenerative disc disease 12/15/2016   OSTEOPOROSIS 11/06/2007   PAD (peripheral artery disease) (HCC)    PERIMENOPAUSAL STATUS 09/13/2007   RLS (restless legs syndrome)    Stroke (HCC)    TIA (transient ischemic attack) 11/04/2011    Past Surgical History:  Procedure Laterality Date   ABDOMINAL AORTOGRAM W/LOWER EXTREMITY N/A 08/27/2020   Procedure: ABDOMINAL AORTOGRAM W/LOWER EXTREMITY;  Surgeon: Cody Das, MD;  [facility];  Service: Cardiovascular;  Laterality: N/A;   BREAST EXCISIONAL BIOPSY Left    BREAST SURGERY  2008 and 2012   x 2 - benign, left side   carotid artery surgery Left    CESAREAN SECTION     x 3   COLONOSCOPY  multiple   2010   PERIPHERAL VASCULAR BALLOON ANGIOPLASTY  08/27/2020   Procedure: PERIPHERAL VASCULAR BALLOON ANGIOPLASTY;  Surgeon: Cody Das, MD;  [facility]: [facility];  Service: Cardiovascular;;  Right SFA scoring balloon   SHOULDER ARTHROSCOPY WITH SUBACROMIAL DECOMPRESSION, ROTATOR CUFF REPAIR AND BICEP TENDON REPAIR Right 06/08/2023   Procedure: RIGHT SHOULDER ARTHROSCOPY, DEBRIDEMENT, MINI OPEN ROTATOR  CUFF TEAR REPAIR AND BICEPS TENODESIS;  Surgeon: Jasmine Mesi, MD;  Location: [facility];  Service: Orthopedics;  Laterality: Right;   TRANSFORAMINAL LUMBAR INTERBODY FUSION (TLIF) WITH PEDICLE SCREW FIXATION 1 LEVEL Left 07/10/2020   Procedure: LEFT-SIDED LUMBAR FOUR-FIVE TRANSFORAMINAL LUMBAR INTERBODY FUSION WITH INSTRUMENTATION AND ALLOGRAFT;  Surgeon: Virl Grimes, MD;  Location: [facility];  Service: Orthopedics;  Laterality: Left;    Family History  Problem Relation Age of Onset   Hypertension Mother 66   Heart disease Mother    Heart failure Mother 74   Hypertension Brother    Colon cancer Brother 18   Dementia Maternal Grandmother    Breast cancer Cousin    Hyperlipidemia Other    Hypertension Other    Coronary artery disease Other     Social History   Tobacco Use   Smoking status: Former    Current packs/day: 0.00    Types: Cigarettes    Quit  date: 05/31/1979    Years since quitting: 44.9   Smokeless tobacco: Current    Types: Snuff   Tobacco comments:    social smoker  Substance Use Topics   Alcohol use: Not Currently    Subjective:     I connected with Kyleeann B Koo on 04/18/24 at 11:20 AM EDT by a telephone call and verified that I am speaking with the correct person using two identifiers.   I discussed the limitations of evaluation and management by telemedicine and the availability of in person appointments. The patient expressed understanding and agreed to proceed. Provider in office/ patient is at home; provider and patient are only 2 people on telephone call.   Patient is concerned about possible short term memory changes. Notes symptoms have been present "for a while" but she was embarrassed to talk about the matter. She feels like she is not remembering things as well as she did in the past; notes that her family has asked her to make this appointment; grandmother did have dementia;   Objective:  Vitals:   04/18/24 1110  Height: 4\' 11"  (1.499 m)     General: Well developed, well nourished, in no acute distress  Lungs: Respirations unlabored;  Neurologic: Alert and oriented; speech intact;   Assessment:  1. Memory changes   2. Hypothyroidism, unspecified type   3. Vitamin D  deficiency     Plan:  Will plan to update some labs to rule out vitamin deficiencies or need to adjust her Synthroid ; will update referral to neurology as well; follow up to be determined- to consider MRI of brain if cannot get seen at neurology soon;  No follow-ups on file.  Orders Placed This Encounter  Procedures   B12    Standing Status:   Future    Expiration Date:   04/18/2025   TSH    Standing Status:   Future    Expiration Date:   04/18/2025   CBC with Differential/Platelet    Standing Status:   Future    Expiration Date:   04/18/2025   Comp Met (CMET)    Standing Status:   Future    Expiration Date:   04/18/2025   Vitamin D  (25 hydroxy)    Standing Status:   Future    Expiration Date:   04/18/2025    Requested Prescriptions    No prescriptions requested or ordered in this encounter

## 2024-04-19 ENCOUNTER — Other Ambulatory Visit (INDEPENDENT_AMBULATORY_CARE_PROVIDER_SITE_OTHER)

## 2024-04-19 ENCOUNTER — Other Ambulatory Visit: Payer: Self-pay | Admitting: Family

## 2024-04-19 DIAGNOSIS — E039 Hypothyroidism, unspecified: Secondary | ICD-10-CM | POA: Diagnosis not present

## 2024-04-19 DIAGNOSIS — E559 Vitamin D deficiency, unspecified: Secondary | ICD-10-CM | POA: Diagnosis not present

## 2024-04-19 DIAGNOSIS — R413 Other amnesia: Secondary | ICD-10-CM | POA: Diagnosis not present

## 2024-04-19 LAB — COMPREHENSIVE METABOLIC PANEL WITH GFR
ALT: 16 U/L (ref 0–35)
AST: 20 U/L (ref 0–37)
Albumin: 4.2 g/dL (ref 3.5–5.2)
Alkaline Phosphatase: 97 U/L (ref 39–117)
BUN: 15 mg/dL (ref 6–23)
CO2: 30 meq/L (ref 19–32)
Calcium: 9.2 mg/dL (ref 8.4–10.5)
Chloride: 101 meq/L (ref 96–112)
Creatinine, Ser: 0.98 mg/dL (ref 0.40–1.20)
GFR: 57.88 mL/min — ABNORMAL LOW (ref >60.00)
Glucose, Bld: 91 mg/dL (ref 70–99)
Potassium: 3.9 meq/L (ref 3.5–5.1)
Sodium: 139 meq/L (ref 135–145)
Total Bilirubin: 0.5 mg/dL (ref 0.2–1.2)
Total Protein: 6.6 g/dL (ref 6.0–8.3)

## 2024-04-19 LAB — CBC WITH DIFFERENTIAL/PLATELET
Basophils Absolute: 0 10*3/uL (ref 0.0–0.1)
Basophils Relative: 0.4 % (ref 0.0–3.0)
Eosinophils Absolute: 0.1 10*3/uL (ref 0.0–0.7)
Eosinophils Relative: 1.5 % (ref 0.0–5.0)
HCT: 37.9 % (ref 36.0–46.0)
Hemoglobin: 12.6 g/dL (ref 12.0–15.0)
Lymphocytes Relative: 29.3 % (ref 12.0–46.0)
Lymphs Abs: 1.9 10*3/uL (ref 0.7–4.0)
MCHC: 33.3 g/dL (ref 30.0–36.0)
MCV: 95.2 fl (ref 78.0–100.0)
Monocytes Absolute: 0.6 10*3/uL (ref 0.1–1.0)
Monocytes Relative: 9.7 % (ref 3.0–12.0)
Neutro Abs: 3.8 10*3/uL (ref 1.4–7.7)
Neutrophils Relative %: 59.1 % (ref 43.0–77.0)
Platelets: 213 10*3/uL (ref 150.0–400.0)
RBC: 3.98 Mil/uL (ref 3.87–5.11)
RDW: 14.6 % (ref 11.5–15.5)
WBC: 6.3 10*3/uL (ref 4.0–10.5)

## 2024-04-19 LAB — TSH: TSH: 2.69 u[IU]/mL (ref 0.35–5.50)

## 2024-04-19 LAB — VITAMIN D 25 HYDROXY (VIT D DEFICIENCY, FRACTURES): VITD: 70.84 ng/mL (ref 30.00–100.00)

## 2024-04-19 LAB — VITAMIN B12: Vitamin B-12: 1092 pg/mL — ABNORMAL HIGH (ref 211–911)

## 2024-04-26 ENCOUNTER — Ambulatory Visit (HOSPITAL_COMMUNITY)
Admission: RE | Admit: 2024-04-26 | Discharge: 2024-04-26 | Disposition: A | Discharge status: Home / Self Care | Source: Ambulatory Visit | Attending: Cardiology | Admitting: Cardiology

## 2024-04-26 DIAGNOSIS — E7800 Pure hypercholesterolemia, unspecified: Secondary | ICD-10-CM | POA: Diagnosis not present

## 2024-04-26 DIAGNOSIS — I6523 Occlusion and stenosis of bilateral carotid arteries: Secondary | ICD-10-CM | POA: Insufficient documentation

## 2024-04-26 DIAGNOSIS — Z9889 Other specified postprocedural states: Secondary | ICD-10-CM | POA: Diagnosis not present

## 2024-04-27 ENCOUNTER — Ambulatory Visit: Payer: Self-pay | Admitting: *Deleted

## 2024-04-27 DIAGNOSIS — I6523 Occlusion and stenosis of bilateral carotid arteries: Secondary | ICD-10-CM

## 2024-04-27 LAB — LIPID PANEL
Chol/HDL Ratio: 2.1 ratio (ref 0.0–4.4)
Cholesterol, Total: 195 mg/dL (ref 100–199)
HDL: 91 mg/dL (ref >39)
LDL Chol Calc (NIH): 93 mg/dL (ref 0–99)
Triglycerides: 60 mg/dL (ref 0–149)
VLDL Cholesterol Cal: 11 mg/dL (ref 5–40)

## 2024-04-28 ENCOUNTER — Telehealth: Payer: Self-pay

## 2024-04-28 DIAGNOSIS — Z79899 Other long term (current) drug therapy: Secondary | ICD-10-CM

## 2024-04-28 DIAGNOSIS — E7800 Pure hypercholesterolemia, unspecified: Secondary | ICD-10-CM

## 2024-04-28 MED ORDER — ROSUVASTATIN CALCIUM 40 MG PO TABS: 40.0000 mg | ORAL_TABLET | Freq: Every day | ORAL | 3 refills | Status: AC

## 2024-04-28 NOTE — Progress Notes (Signed)
 No change in carotid stenosis, recheck in 1 year. Orders placed  Carotid artery duplex 04/27/2024: Right ICA 40 to 59% stenosis with diffuse heterogenous plaque. Left ICA 1-39% stenosis, tortuous segments.  CCA <50% stenosis with mild plaque. Bilateral antegrade vertebral artery flow.  Normal flow hemodynamics in bilateral subclavian arteries.

## 2024-04-28 NOTE — Telephone Encounter (Signed)
 Spoke with patient and shared lab results per Dr. Berry Bristol:  Lipids previously were well-controlled.  LDL has increased but improved from previous 115 to the present 93.  Patient has PAD and also carotid artery disease, would recommend increasing Crestor  to 40 mg and recheck in 2 months.  Otherwise we will have to consider Repatha in addition to the present management.      Rosuvastatin  40 mg daily sent to [facility]. Lipid panel ordered and released to [facility], plan to have drawn in 2 months.  Patient verbalized understanding of the above.

## 2024-04-28 NOTE — Progress Notes (Signed)
 Lipids previously were well-controlled.  LDL has increased but improved from previous 115 to the present 93.  Patient has PAD and also carotid artery disease, would recommend increasing Crestor  to 40 mg and recheck in 2 months.  Otherwise we will have to consider Repatha in addition to the present management.

## 2024-05-01 ENCOUNTER — Other Ambulatory Visit: Payer: Self-pay

## 2024-05-01 ENCOUNTER — Other Ambulatory Visit: Payer: Self-pay | Admitting: Cardiology

## 2024-05-01 DIAGNOSIS — R002 Palpitations: Secondary | ICD-10-CM

## 2024-05-01 DIAGNOSIS — I10 Essential (primary) hypertension: Secondary | ICD-10-CM

## 2024-05-01 MED ORDER — LOSARTAN POTASSIUM 25 MG PO TABS: 25.0000 mg | ORAL_TABLET | Freq: Every evening | ORAL | 3 refills | Status: AC

## 2024-05-01 MED ORDER — DILTIAZEM HCL ER COATED BEADS 180 MG PO CP24: 180.0000 mg | ORAL_CAPSULE | Freq: Every day | ORAL | 3 refills | Status: AC

## 2024-05-05 ENCOUNTER — Other Ambulatory Visit: Payer: Self-pay | Admitting: Cardiology

## 2024-05-05 DIAGNOSIS — I739 Peripheral vascular disease, unspecified: Secondary | ICD-10-CM

## 2024-05-05 DIAGNOSIS — E7800 Pure hypercholesterolemia, unspecified: Secondary | ICD-10-CM

## 2024-05-11 ENCOUNTER — Ambulatory Visit: Admitting: Physician Assistant

## 2024-05-22 ENCOUNTER — Other Ambulatory Visit: Payer: Self-pay | Admitting: Surgical

## 2024-05-22 ENCOUNTER — Other Ambulatory Visit: Payer: Self-pay | Admitting: Cardiology

## 2024-05-22 ENCOUNTER — Other Ambulatory Visit: Payer: Self-pay | Admitting: Family

## 2024-05-22 DIAGNOSIS — I739 Peripheral vascular disease, unspecified: Secondary | ICD-10-CM

## 2024-05-22 DIAGNOSIS — E7800 Pure hypercholesterolemia, unspecified: Secondary | ICD-10-CM

## 2024-05-22 NOTE — Telephone Encounter (Signed)
 Requesting: Ambien  10 mg  Contract:N/A UDS: N/A Last Visit: 02/15/2024 Next Visit: N/A Last Refill: 02/15/2024  Please Advise

## 2024-06-06 ENCOUNTER — Encounter: Payer: Self-pay | Admitting: Physician Assistant

## 2024-06-06 ENCOUNTER — Telehealth: Payer: Self-pay | Admitting: *Deleted

## 2024-06-06 ENCOUNTER — Other Ambulatory Visit: Payer: Self-pay | Admitting: Cardiology

## 2024-06-06 ENCOUNTER — Ambulatory Visit: Admitting: Physician Assistant

## 2024-06-06 ENCOUNTER — Other Ambulatory Visit: Payer: Self-pay

## 2024-06-06 VITALS — BP 132/74 | HR 64 | Ht 59.0 in | Wt 151.4 lb

## 2024-06-06 DIAGNOSIS — Z7901 Long term (current) use of anticoagulants: Secondary | ICD-10-CM | POA: Diagnosis not present

## 2024-06-06 DIAGNOSIS — E7800 Pure hypercholesterolemia, unspecified: Secondary | ICD-10-CM

## 2024-06-06 DIAGNOSIS — Z860101 Personal history of adenomatous and serrated colon polyps: Secondary | ICD-10-CM | POA: Diagnosis not present

## 2024-06-06 DIAGNOSIS — Z09 Encounter for follow-up examination after completed treatment for conditions other than malignant neoplasm: Secondary | ICD-10-CM

## 2024-06-06 MED ORDER — EZETIMIBE 10 MG PO TABS: 10.0000 mg | ORAL_TABLET | Freq: Every day | ORAL | 2 refills | Status: AC

## 2024-06-06 MED ORDER — NA SULFATE-K SULFATE-MG SULF 17.5-3.13-1.6 GM/177ML PO SOLN: 1.0000 | Freq: Once | ORAL | 0 refills | Status: AC

## 2024-06-06 MED ORDER — METOCLOPRAMIDE HCL 10 MG PO TABS: ORAL_TABLET | ORAL | 0 refills | Status: DC

## 2024-06-06 NOTE — Progress Notes (Signed)
 Chief Complaint: Discuss colonoscopy in a patient on chronic anticoagulation  HPI:    [name] [name] is a 73 year old African-American female, known to Dr. Avram, with a past medical history as listed below including CAD with echo with EF 50% and mild to moderate mitral valve regurg on Plavix , alcohol abuse and multiple others, who was referred to me by Jason Leita Repine,* for a discussion of colonoscopy.    06/30/2018 colonoscopy done for personal history of adenomatous polyps with an 8 mm polyp in the sigmoid colon, and a 1 mm polyp in the transverse colon as well as diverticulosis in the right colon.  Repeat recommended in 5 years.    04/19/2024 B12, TSH, lipid profile, CBC, CMP, hemoglobin A1c and vitamin D  all normal    Today, the patient tells me that she presents to clinic to discuss a colonoscopy.  It is past time for her to have one.  She does ask that we give her a smaller volume prep as she had some trouble and nausea with the last.  She has had to hold her Plavix  before for things.    Denies fever, chills, weight loss, change in bowel habits or abdominal pain.  Past Medical History:  Diagnosis Date   Acid reflux    ALCOHOL ABUSE, HX OF 11/06/2007   ALLERGIC RHINITIS 11/06/2007   Anxiety 04/03/2011   ASTHMA 09/13/2007   Asthma    Chronic pain syndrome 12/15/2016   COLONIC POLYPS, HX OF 02/09/2008    ADENOMATOUS POLYP   Coronary artery disease    Encounter for well adult exam without abnormal findings 04/03/2011   HYPERLIPIDEMIA 11/03/2010   HYPERTENSION 09/13/2007   HYPOTHYROIDISM 11/06/2007   Impaired glucose tolerance 07/30/2013   INSOMNIA, HX OF 09/13/2007   Lumbar degenerative disc disease 12/15/2016   OSTEOPOROSIS 11/06/2007   PAD (peripheral artery disease) (HCC)    PERIMENOPAUSAL STATUS 09/13/2007   RLS (restless legs syndrome)    Stroke [facility])    TIA (transient ischemic attack) 11/04/2011    Past Surgical History:  Procedure Laterality Date   ABDOMINAL  AORTOGRAM W/LOWER EXTREMITY N/A 08/27/2020   Procedure: ABDOMINAL AORTOGRAM W/LOWER EXTREMITY;  Surgeon: Elmira Newman PARAS, MD;  [facility];  Service: Cardiovascular;  Laterality: N/A;   BREAST EXCISIONAL BIOPSY Left    BREAST SURGERY  2008 and 2012   x 2 - benign, left side   carotid artery surgery Left    CESAREAN SECTION     x 3   COLONOSCOPY  multiple   2010   PERIPHERAL VASCULAR BALLOON ANGIOPLASTY  08/27/2020   Procedure: PERIPHERAL VASCULAR BALLOON ANGIOPLASTY;  Surgeon: Elmira Newman PARAS, MD;  [facility];  Service: Cardiovascular;;  Right SFA scoring balloon   SHOULDER ARTHROSCOPY WITH SUBACROMIAL DECOMPRESSION, ROTATOR CUFF REPAIR AND BICEP TENDON REPAIR Right 06/08/2023   Procedure: RIGHT SHOULDER ARTHROSCOPY, DEBRIDEMENT, MINI OPEN ROTATOR CUFF TEAR REPAIR AND BICEPS TENODESIS;  Surgeon: Addie Cordella Hamilton, MD;  Location: [facility];  Service: Orthopedics;  Laterality: Right;   TRANSFORAMINAL LUMBAR INTERBODY FUSION (TLIF) WITH PEDICLE SCREW FIXATION 1 LEVEL Left 07/10/2020   Procedure: LEFT-SIDED LUMBAR FOUR-FIVE TRANSFORAMINAL LUMBAR INTERBODY FUSION WITH INSTRUMENTATION AND ALLOGRAFT;  Surgeon: Beuford Anes, MD;  Location: [facility];  Service: Orthopedics;  Laterality: Left;    Current Outpatient Medications  Medication Sig Dispense Refill   acitretin (SORIATANE) 25 MG capsule Take 25 mg by mouth daily.     Calcium  Carbonate-Vit D-Min (CALCIUM  1200 PO) Take 1,200 mg  by mouth daily.     Cholecalciferol  (VITAMIN D ) 50 MCG (2000 UT) tablet Take 2,000 Units by mouth daily.     citalopram  (CELEXA ) 20 MG tablet TAKE 1 TABLET BY MOUTH EVERY DAY 90 tablet 3   clopidogrel  (PLAVIX ) 75 MG tablet TAKE 1 TABLET BY MOUTH EVERY DAY 90 tablet 3   diltiazem  (CARDIZEM  CD) 180 MG 24 hr capsule Take 1 capsule (180 mg total) by mouth daily. 90 capsule 3   ezetimibe  (ZETIA ) 10 MG tablet Take 1 tablet (10 mg total) by mouth daily. 90 tablet 0   fluticasone -salmeterol  (ADVAIR DISKUS) 250-50 MCG/ACT AEPB Inhale 1 puff into the lungs in the morning and at bedtime. 60 each 3   levothyroxine  (SYNTHROID ) 88 MCG tablet TAKE 1 TABLET BY MOUTH EVERY DAY BEFORE BREAKFAST 90 tablet 3   losartan  (COZAAR ) 25 MG tablet Take 1 tablet (25 mg total) by mouth every evening. 90 tablet 3   meloxicam  (MOBIC ) 15 MG tablet TAKE 1 TABLET (15 MG TOTAL) BY MOUTH DAILY. 30 tablet 0   methocarbamol  (ROBAXIN ) 500 MG tablet TAKE 1 TABLET (500 MG TOTAL) BY MOUTH EVERY 12 (TWELVE) HOURS AS NEEDED. 30 tablet 0   montelukast  (SINGULAIR ) 10 MG tablet TAKE 1 TABLET BY MOUTH EVERY DAY 90 tablet 0   Multiple Vitamin (MULTIVITAMIN WITH MINERALS) TABS tablet Take 1 tablet by mouth daily.     Omega-3 Fatty Acids (FISH OIL) 1000 MG CAPS Take 1,000 mg by mouth daily.     pantoprazole  (PROTONIX ) 40 MG tablet Take 1 tablet (40 mg total) by mouth 2 (two) times daily. (Patient taking differently: Take 40 mg by mouth daily as needed (acid reflux).) 180 tablet 3   rOPINIRole  (REQUIP ) 0.5 MG tablet TAKE 1 TABLET BY MOUTH AT BEDTIME. 90 tablet 3   rosuvastatin  (CRESTOR ) 40 MG tablet Take 1 tablet (40 mg total) by mouth daily. 90 tablet 3   zolpidem  (AMBIEN ) 10 MG tablet TAKE 1 TABLET (10 MG TOTAL) BY MOUTH AT BEDTIME AS NEEDED FOR SLEEP 90 tablet 0   No current facility-administered medications for this visit.    Allergies as of 06/06/2024 - Review Complete 04/03/2024  Allergen Reaction Noted   Atorvastatin  Other (See Comments) 03/31/2022   Benazepril  Swelling 01/29/2023   Lisinopril  Cough 11/17/2022   Fosamax [alendronate sodium] Nausea And Vomiting 03/13/2011   Penicillins Hives    Trazodone  and nefazodone  10/13/2022    Family History  Problem Relation Age of Onset   Hypertension Mother 35   Heart disease Mother    Heart failure Mother 27   Hypertension Brother    Colon cancer Brother 51   Dementia Maternal Grandmother    Breast cancer Cousin    Hyperlipidemia Other    Hypertension Other     Coronary artery disease Other     Social History   Socioeconomic History   Marital status: Divorced    Spouse name: Not on file   Number of children: 3   Years of education: Not on file   Highest education level: Not on file  Occupational History   Occupation: Youth worker    Employer: [employer]  Tobacco Use   Smoking status: Former    Current packs/day: 0.00    Types: Cigarettes    Quit date: 05/31/1979    Years since quitting: 45.0   Smokeless tobacco: Current    Types: Snuff   Tobacco comments:    social smoker  Vaping Use   Vaping status: Never  Used  Substance and Sexual Activity   Alcohol use: Not Currently   Drug use: Not Currently   Sexual activity: Not on file  Other Topics Concern   Not on file  Social History Narrative   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)   Divorced   3 children   Watches grandchildren for daughter in [place] school   No EtOH, tobacco, drugs   Social Drivers of Corporate investment banker Strain: Low Risk  (09/23/2020)   Overall Financial Resource Strain (CARDIA)    Difficulty of Paying Living Expenses: Not hard at all  Food Insecurity: No Food Insecurity (09/23/2020)   Hunger Vital Sign    Worried About Running Out of Food in the Last Year: Never true    Ran Out of Food in the Last Year: Never true  Transportation Needs: No Transportation Needs (09/23/2020)   PRAPARE - Administrator, Civil Service (Medical): No    Lack of Transportation (Non-Medical): No  Physical Activity: Sufficiently Active (09/23/2020)   Exercise Vital Sign    Days of Exercise per Week: 5 days    Minutes of Exercise per Session: 30 min  Stress: No Stress Concern Present (09/23/2020)   [employer] of Occupational Health - Occupational Stress Questionnaire    Feeling of Stress : Not at all  Social Connections: Unknown (09/23/2020)   Social Connection and Isolation Panel    Frequency of Communication with Friends and  Family: More than three times a week    Frequency of Social Gatherings with Friends and Family: More than three times a week    Attends Religious Services: More than 4 times per year    Active Member of [employer] or Organizations: Yes    Attends Engineer, structural: More than 4 times per year    Marital Status: Patient declined  Catering manager Violence: Not on file    Review of Systems:    Constitutional: No weight loss, fever or chills Skin: No rash Cardiovascular: No chest pain Respiratory: No SOB  Gastrointestinal: See HPI and otherwise negative Genitourinary: No dysuria  Neurological: No headache, dizziness or syncope Musculoskeletal: No new muscle or joint pain Hematologic: No bleeding  Psychiatric: No history of depression or anxiety   Physical Exam:  Vital signs: BP 132/74   Pulse 64   Ht 4' 11 (1.499 m)   Wt 151 lb 6.4 oz (68.7 kg)   BMI 30.58 kg/m    Constitutional:   Pleasant AA female appears to be in NAD, Well developed, Well nourished, alert and cooperative Head:  Normocephalic and atraumatic. Eyes:   PEERL, EOMI. No icterus. Conjunctiva pink. Ears:  Normal auditory acuity. Neck:  Supple Throat: Oral cavity and pharynx without inflammation, swelling or lesion.  Respiratory: Respirations even and unlabored. Lungs clear to auscultation bilaterally.   No wheezes, crackles, or rhonchi.  Cardiovascular: Normal S1, S2. No MRG. Regular rate and rhythm. No peripheral edema, cyanosis or pallor.  Gastrointestinal:  Soft, nondistended, nontender. No rebound or guarding. Normal bowel sounds. No appreciable masses or hepatomegaly. Rectal:  Not performed.  Msk:  Symmetrical without gross deformities. Without edema, no deformity or joint abnormality.  Neurologic:  Alert and  oriented x4;  grossly normal neurologically.  Skin:   Dry and intact without significant lesions or rashes. Psychiatric: Demonstrates good judgement and reason without abnormal affect or  behaviors.  RELEVANT LABS AND IMAGING: CBC    Component Value Date/Time   WBC 6.3 04/19/2024 1311  RBC 3.98 04/19/2024 1311   HGB 12.6 04/19/2024 1311   HGB 12.3 06/17/2021 1651   HCT 37.9 04/19/2024 1311   HCT 36.2 06/17/2021 1651   PLT 213.0 04/19/2024 1311   PLT 267 06/17/2021 1651   MCV 95.2 04/19/2024 1311   MCV 92 06/17/2021 1651   MCH 30.5 05/31/2023 1520   MCHC 33.3 04/19/2024 1311   RDW 14.6 04/19/2024 1311   RDW 13.1 06/17/2021 1651   LYMPHSABS 1.9 04/19/2024 1311   MONOABS 0.6 04/19/2024 1311   EOSABS 0.1 04/19/2024 1311   BASOSABS 0.0 04/19/2024 1311    CMP     Component Value Date/Time   NA 139 04/19/2024 1311   NA 141 06/17/2021 1651   K 3.9 04/19/2024 1311   CL 101 04/19/2024 1311   CO2 30 04/19/2024 1311   GLUCOSE 91 04/19/2024 1311   GLUCOSE 101 (H) 10/29/2006 1025   BUN 15 04/19/2024 1311   BUN 22 06/17/2021 1651   CREATININE 0.98 04/19/2024 1311   CALCIUM  9.2 04/19/2024 1311   PROT 6.6 04/19/2024 1311   PROT 6.5 06/17/2021 1651   ALBUMIN 4.2 04/19/2024 1311   ALBUMIN 4.2 06/17/2021 1651   AST 20 04/19/2024 1311   ALT 16 04/19/2024 1311   ALKPHOS 97 04/19/2024 1311   BILITOT 0.5 04/19/2024 1311   BILITOT 0.4 06/17/2021 1651   GFRNONAA >60 05/31/2023 1520   GFRAA 80 08/08/2020 1408    Assessment: 1.  History of adenomatous polyps: Last colonoscopy in 2019 with repeat recommended in 5 years, patient is overdue 2.  Chronic anticoagulation for CAD: On Plavix   Plan: 1.  Patient scheduled for surveillance colonoscopy in the [facility] with Dr. Avram.  Did provide the patient with a detailed list of risks for the procedure and she agrees to proceed. Patient is appropriate for endoscopic procedure(s) in the ambulatory ([facility]) setting.  2.  Patient advised to hold her Plavix  for 5 days prior to time of procedure.  We will communicate with her prescribing physician to ensure this is acceptable for her. 3.  Patient given Suprep and Reglan 10 mg to take 20  to 30 minutes before each half of prep #2 with no refill 4.  Patient to follow in clinic per recommendations after time of procedure.  Delon Failing, PA-C [facility] 06/06/2024, 2:11 PM  Cc: Jason Leita Repine,*

## 2024-06-06 NOTE — Patient Instructions (Addendum)
 We have sent the following medications to your pharmacy for you to pick up at your convenience: Reglan 10 mg take 1 tablet 20-30 minutes prior to drinking colonoscopy prep.   You have been scheduled for a colonoscopy. Please follow written instructions given to you at your visit today.   If you use inhalers (even only as needed), please bring them with you on the day of your procedure.  DO NOT TAKE 7 DAYS PRIOR TO TEST- Trulicity (dulaglutide) Ozempic, Wegovy (semaglutide) Mounjaro (tirzepatide) Bydureon Bcise (exanatide extended release)  DO NOT TAKE 1 DAY PRIOR TO YOUR TEST Rybelsus (semaglutide) Adlyxin (lixisenatide) Victoza (liraglutide) Byetta (exanatide) ______________________________________________________________________

## 2024-06-06 NOTE — Telephone Encounter (Signed)
 Hold Plavix  for 5 days, restart same day if no biopsy otherwise when patient is felt to be stable to restart Plavix .  Low risk.

## 2024-06-06 NOTE — Telephone Encounter (Signed)
  [name] [name] September 01, 1951 [id number]  06/06/24   Dear Dr. Ladona:  We have scheduled the above named patient for a(n) colonoscopy procedure. Our records show that (s)he is on anticoagulation therapy.  Please advise as to whether the patient may come off their therapy of Plavix  5 days prior to their procedure which is scheduled for 06/14/24.  Please route your response to Powell Misty, CMA or fax response to [phone].  Sincerely,    [facility]

## 2024-06-07 ENCOUNTER — Encounter: Payer: Self-pay | Admitting: Internal Medicine

## 2024-06-07 NOTE — Telephone Encounter (Signed)
 Patient informed she may hold Plavix .

## 2024-06-14 ENCOUNTER — Encounter: Payer: Self-pay | Admitting: Internal Medicine

## 2024-06-14 ENCOUNTER — Ambulatory Visit: Admitting: Internal Medicine

## 2024-06-14 VITALS — BP 136/59 | HR 66 | Temp 97.2°F | Resp 19 | Ht 59.0 in | Wt 151.0 lb

## 2024-06-14 DIAGNOSIS — D128 Benign neoplasm of rectum: Secondary | ICD-10-CM

## 2024-06-14 DIAGNOSIS — D124 Benign neoplasm of descending colon: Secondary | ICD-10-CM

## 2024-06-14 DIAGNOSIS — Z860101 Personal history of adenomatous and serrated colon polyps: Secondary | ICD-10-CM | POA: Diagnosis not present

## 2024-06-14 DIAGNOSIS — K5730 Diverticulosis of large intestine without perforation or abscess without bleeding: Secondary | ICD-10-CM

## 2024-06-14 DIAGNOSIS — Z1211 Encounter for screening for malignant neoplasm of colon: Secondary | ICD-10-CM | POA: Diagnosis not present

## 2024-06-14 DIAGNOSIS — Z800 Family history of malignant neoplasm of digestive organs: Secondary | ICD-10-CM | POA: Diagnosis not present

## 2024-06-14 MED ORDER — SODIUM CHLORIDE 0.9 % IV SOLN: 500.0000 mL | Freq: Once | INTRAVENOUS | Status: DC

## 2024-06-14 NOTE — Progress Notes (Signed)
 Vss nad trans to pacu

## 2024-06-14 NOTE — Progress Notes (Signed)
 Called to room to assist during endoscopic procedure.  Patient ID and intended procedure confirmed with present staff. Received instructions for my participation in the procedure from the performing physician.

## 2024-06-14 NOTE — Op Note (Signed)
 [facility] Patient Name: [name] [name] Procedure Date: 06/14/2024 9:01 AM MRN: [id number] Endoscopist: Lupita FORBES Commander , MD, [id number] Age: 73 Referring MD:  Date of Birth: 08/05/1951 Gender: Female Account #: [id number] Procedure:                Colonoscopy Indications:              Surveillance: Personal history of adenomatous                            polyps on last colonoscopy > 5 years ago, Last                            colonoscopy: 2019 Medicines:                Monitored Anesthesia Care Procedure:                Pre-Anesthesia Assessment:                           - Prior to the procedure, a History and Physical                            was performed, and patient medications and                            allergies were reviewed. The patient's tolerance of                            previous anesthesia was also reviewed. The risks                            and benefits of the procedure and the sedation                            options and risks were discussed with the patient.                            All questions were answered, and informed consent                            was obtained. Prior Anticoagulants: The patient has                            taken no anticoagulant or antiplatelet agents. ASA                            Grade Assessment: III - A patient with severe                            systemic disease. After reviewing the risks and                            benefits, the patient was deemed in satisfactory  condition to undergo the procedure.                           After obtaining informed consent, the colonoscope                            was passed under direct vision. Throughout the                            procedure, the patient's blood pressure, pulse, and                            oxygen saturations were monitored continuously. The                            Olympus Scope SN: [id number] was introduced  through                            the anus and advanced to the the cecum, identified                            by appendiceal orifice and ileocecal valve. The                            colonoscopy was performed without difficulty. The                            patient tolerated the procedure well. The quality                            of the bowel preparation was good. The ileocecal                            valve, appendiceal orifice, and rectum were                            photographed. The bowel preparation used was SUPREP                            via split dose instruction. Scope In: 9:13:19 AM Scope Out: 9:29:02 AM Scope Withdrawal Time: 0 hours 13 minutes 27 seconds  Total Procedure Duration: 0 hours 15 minutes 43 seconds  Findings:                 The perianal and digital rectal examinations were                            normal.                           Four sessile polyps were found in the rectum and                            descending colon. The polyps were 2 to 6 mm in  size. These polyps were removed with a cold snare.                            Resection and retrieval were complete. Verification                            of patient identification for the specimen was                            done. Estimated blood loss was minimal.                           Multiple diverticula were found in the sigmoid                            colon, transverse colon and ascending colon.                           The exam was otherwise without abnormality on                            direct and retroflexion views. Complications:            No immediate complications. Estimated Blood Loss:     Estimated blood loss was minimal. Impression:               - Four 2 to 6 mm polyps in the rectum and in the                            descending colon, removed with a cold snare.                            Resected and retrieved.                            - Diverticulosis in the sigmoid colon, in the                            transverse colon and in the ascending colon.                           - The examination was otherwise normal on direct                            and retroflexion views.                           - Personal history of colonic polyps. Multiple                            tomes - most recent was 2019 w/ 1mm and 8 mm                            adenomas Recommendation:           -  Patient has a contact number available for                            emergencies. The signs and symptoms of potential                            delayed complications were discussed with the                            patient. Return to normal activities tomorrow.                            Written discharge instructions were provided to the                            patient.                           - Resume previous diet.                           - Continue present medications.                           - Await pathology results.                           - Repeat colonoscopy is recommended for                            surveillance. The colonoscopy date will be                            determined after pathology results from today's                            exam become available for review.                           - Resume Plavix  (clopidogrel ) at prior dose                            tomorrow. Lupita FORBES Commander, MD 06/14/2024 9:39:21 AM This report has been signed electronically.

## 2024-06-14 NOTE — Progress Notes (Signed)
 History and Physical Interval Note:  06/14/2024 9:07 AM  [name] [name]  has presented today for endoscopic procedure(s), with the diagnosis of  Encounter Diagnoses  Name Primary?   History of adenomatous polyp of colon Yes   Family history of colon cancer   .  The various methods of evaluation and treatment have been discussed with the patient and/or family. After consideration of risks, benefits and other options for treatment, the patient has consented to  the endoscopic procedure(s).   The patient's history has been reviewed, patient examined, no change in status, stable for endoscopic procedure(s).  I have reviewed the patient's chart and labs.  Questions were answered to the patient's satisfaction.     Lupita CHARLENA Commander, MD, NOLIA

## 2024-06-14 NOTE — Progress Notes (Signed)
 Updated medical record.

## 2024-06-14 NOTE — Patient Instructions (Addendum)
 I found and removed 4 small polyps. I will let you know pathology results and when to have another routine colonoscopy by mail and/or My Chart.  You also have a condition called diverticulosis - common and not usually a problem. Please read the handout provided.  Please restart Plavix  (clopidogrel ) tomorrow.  I appreciate the opportunity to care for you. Lupita CHARLENA Commander, MD, FACG  YOU HAD AN ENDOSCOPIC PROCEDURE TODAY AT THE [facility]:   Refer to the procedure report that was given to you for any specific questions about what was found during the examination.  If the procedure report does not answer your questions, please call your gastroenterologist to clarify.  If you requested that your care partner not be given the details of your procedure findings, then the procedure report has been included in a sealed envelope for you to review at your convenience later.  YOU SHOULD EXPECT: Some feelings of bloating in the abdomen. Passage of more gas than usual.  Walking can help get rid of the air that was put into your GI tract during the procedure and reduce the bloating. If you had a lower endoscopy (such as a colonoscopy or flexible sigmoidoscopy) you may notice spotting of blood in your stool or on the toilet paper. If you underwent a bowel prep for your procedure, you may not have a normal bowel movement for a few days.  Please Note:  You might notice some irritation and congestion in your nose or some drainage.  This is from the oxygen used during your procedure.  There is no need for concern and it should clear up in a day or so.  SYMPTOMS TO REPORT IMMEDIATELY:  Following lower endoscopy (colonoscopy or flexible sigmoidoscopy):  Excessive amounts of blood in the stool  Significant tenderness or worsening of abdominal pains  Swelling of the abdomen that is new, acute  Fever of 100F or higher  For urgent or emergent issues, a gastroenterologist can be reached at any hour by  calling [phone]. Do not use MyChart messaging for urgent concerns.    DIET:  We do recommend a small meal at first, but then you may proceed to your regular diet.  Drink plenty of fluids but you should avoid alcoholic beverages for 24 hours.  ACTIVITY:  You should plan to take it easy for the rest of today and you should NOT DRIVE or use heavy machinery until tomorrow (because of the sedation medicines used during the test).    FOLLOW UP: Our staff will call the number listed on your records the next business day following your procedure.  We will call around 7:15- 8:00 am to check on you and address any questions or concerns that you may have regarding the information given to you following your procedure. If we do not reach you, we will leave a message.     If any biopsies were taken you will be contacted by phone or by letter within the next 1-3 weeks.  Please call us  at [phone] if you have not heard about the biopsies in 3 weeks.    SIGNATURES/CONFIDENTIALITY: You and/or your care partner have signed paperwork which will be entered into your electronic medical record.  These signatures attest to the fact that that the information above on your After Visit Summary has been reviewed and is understood.  Full responsibility of the confidentiality of this discharge information lies with you and/or your care-partner.

## 2024-06-15 ENCOUNTER — Telehealth: Payer: Self-pay

## 2024-06-15 NOTE — Telephone Encounter (Signed)
  Follow up Call-     06/14/2024    8:31 AM  Call back number  Post procedure Call Back phone  # [phone]  Permission to leave phone message Yes     Patient questions:  Do you have a fever, pain , or abdominal swelling? No. Pain Score  0 *  Have you tolerated food without any problems? Yes.    Have you been able to return to your normal activities? Yes.    Do you have any questions about your discharge instructions: Diet   No. Medications  No. Follow up visit  No.  Do you have questions or concerns about your Care? No.  Actions: * If pain score is 4 or above: No action needed, pain <4.

## 2024-06-19 LAB — SURGICAL PATHOLOGY

## 2024-06-20 ENCOUNTER — Ambulatory Visit: Payer: Self-pay | Admitting: Internal Medicine

## 2024-06-20 DIAGNOSIS — Z860101 Personal history of adenomatous and serrated colon polyps: Secondary | ICD-10-CM

## 2024-06-28 ENCOUNTER — Other Ambulatory Visit: Payer: Self-pay | Admitting: Cardiology

## 2024-06-28 ENCOUNTER — Other Ambulatory Visit: Payer: Self-pay | Admitting: Surgical

## 2024-06-28 DIAGNOSIS — E7800 Pure hypercholesterolemia, unspecified: Secondary | ICD-10-CM

## 2024-06-28 DIAGNOSIS — I739 Peripheral vascular disease, unspecified: Secondary | ICD-10-CM

## 2024-08-01 ENCOUNTER — Other Ambulatory Visit: Payer: Self-pay | Admitting: Surgical

## 2024-08-01 ENCOUNTER — Other Ambulatory Visit: Payer: Self-pay | Admitting: Cardiology

## 2024-08-01 DIAGNOSIS — E7800 Pure hypercholesterolemia, unspecified: Secondary | ICD-10-CM

## 2024-08-01 DIAGNOSIS — I739 Peripheral vascular disease, unspecified: Secondary | ICD-10-CM

## 2024-08-17 ENCOUNTER — Other Ambulatory Visit: Payer: Self-pay | Admitting: Cardiology

## 2024-08-17 ENCOUNTER — Other Ambulatory Visit: Payer: Self-pay | Admitting: Orthopedic Surgery

## 2024-08-17 ENCOUNTER — Other Ambulatory Visit: Payer: Self-pay

## 2024-08-17 ENCOUNTER — Other Ambulatory Visit: Payer: Self-pay | Admitting: Family

## 2024-08-17 DIAGNOSIS — E7800 Pure hypercholesterolemia, unspecified: Secondary | ICD-10-CM

## 2024-08-17 DIAGNOSIS — I739 Peripheral vascular disease, unspecified: Secondary | ICD-10-CM

## 2024-08-17 MED ORDER — CLOPIDOGREL BISULFATE 75 MG PO TABS: 75.0000 mg | ORAL_TABLET | Freq: Every day | ORAL | 1 refills | Status: AC

## 2024-08-21 ENCOUNTER — Ambulatory Visit: Admitting: Neurology

## 2024-08-21 ENCOUNTER — Encounter: Payer: Self-pay | Admitting: Neurology

## 2024-08-21 VITALS — BP 133/68 | HR 63 | Ht 59.0 in | Wt 156.0 lb

## 2024-08-21 DIAGNOSIS — Z818 Family history of other mental and behavioral disorders: Secondary | ICD-10-CM | POA: Diagnosis not present

## 2024-08-21 DIAGNOSIS — G3184 Mild cognitive impairment, so stated: Secondary | ICD-10-CM | POA: Insufficient documentation

## 2024-08-21 DIAGNOSIS — F4329 Adjustment disorder with other symptoms: Secondary | ICD-10-CM | POA: Diagnosis not present

## 2024-08-21 DIAGNOSIS — F419 Anxiety disorder, unspecified: Secondary | ICD-10-CM | POA: Diagnosis not present

## 2024-08-21 DIAGNOSIS — F3289 Other specified depressive episodes: Secondary | ICD-10-CM

## 2024-08-21 MED ORDER — DONEPEZIL HCL 5 MG PO TABS: 5.0000 mg | ORAL_TABLET | Freq: Every day | ORAL | 5 refills | Status: DC

## 2024-08-21 NOTE — Patient Instructions (Signed)
 Donepezil Tablets What is this medication? DONEPEZIL (doe NEP e zil) treats memory loss and confusion (dementia) in people who have Alzheimer disease. It works by improving attention, memory, and the ability to engage in daily activities. It is not a cure for dementia or Alzheimer disease. This medicine may be used for other purposes; ask your health care provider or pharmacist if you have questions. COMMON BRAND NAME(S): Aricept What should I tell my care team before I take this medication? They need to know if you have any of these conditions: Head injury Heart disease Irregular heartbeat or rhythm Liver disease Lung or breathing disease, such as asthma Seizures Stomach ulcers, other stomach or intestine problems Stomach bleeding Trouble passing urine An unusual or allergic reaction to donepezil, other medications, foods, dyes, or preservatives Pregnant or trying to get pregnant Breastfeeding How should I use this medication? Take this medication by mouth with a glass of water. Follow the directions on the prescription label. You may take this medication with or without food. Take this medication at regular intervals. This medication is usually taken before bedtime. Do not take it more often than directed. Continue to take your medication even if you feel better. Do not stop taking except on your care team's advice. If you are taking the 23 mg donepezil tablet, swallow it whole; do not cut, crush, or chew it. Talk to your care team about the use of this medication in children. Special care may be needed. Overdosage: If you think you have taken too much of this medicine contact a poison control center or emergency room at once. NOTE: This medicine is only for you. Do not share this medicine with others. What if I miss a dose? If you miss a dose, take it as soon as you can. If it is almost time for your next dose, take only that dose, do not take double or extra doses. What may interact  with this medication? Do not take this medication with any of the following: Certain medications for fungal infections, such as itraconazole, fluconazole, posaconazole, voriconazole Cisapride Dextromethorphan; quinidine Dronedarone Pimozide Quinidine Thioridazine This medication may also interact with the following: Antihistamines for allergy, cough, and cold Atropine Bethanechol Carbamazepine Certain medications for bladder problems, such as oxybutynin or tolterodine Certain medications for Parkinson disease, such as benztropine or trihexyphenidyl Certain medications for stomach problems, such as dicyclomine or hyoscyamine Certain medications for travel sickness, such as scopolamine Dexamethasone Dofetilide Ipratropium NSAIDs, medications for pain and inflammation, such as ibuprofen or naproxen Other medications for Alzheimer disease Other medications that cause heart rhythm changes Phenobarbital Phenytoin Rifampin, rifabutin, or rifapentine Ziprasidone This list may not describe all possible interactions. Give your health care provider a list of all the medicines, herbs, non-prescription drugs, or dietary supplements you use. Also tell them if you smoke, drink alcohol, or use illegal drugs. Some items may interact with your medicine. What should I watch for while using this medication? Visit your care team for regular checks on your progress. Tell your care team if your symptoms do not start to get better or if they get worse. This medication may affect your coordination, reaction time, or judgment. Do not drive or operate machinery until you know how this medication affects you. Sit up or stand slowly to reduce the risk of dizzy or fainting spells. Drinking alcohol with this medication can increase the risk of these side effects. What side effects may I notice from receiving this medication? Side effects that you should report  to your care team as soon as possible: Allergic  reactions--skin rash, itching, hives, swelling of the face, lips, tongue, or throat Peptic ulcer--burning stomach pain, loss of appetite, bloating, burping, heartburn, nausea, vomiting Seizures Slow heartbeat--dizziness, feeling faint or lightheaded, confusion, trouble breathing, unusual weakness or fatigue Stomach bleeding--bloody or black, tar-like stools, vomiting blood or brown material that looks like coffee grounds Trouble passing urine Side effects that usually do not require medical attention (report these to your care team if they continue or are bothersome): Diarrhea Fatigue Loss of appetite Muscle pain or cramps Nausea Trouble sleeping This list may not describe all possible side effects. Call your doctor for medical advice about side effects. You may report side effects to FDA at 1-800-FDA-1088. Where should I keep my medication? Keep out of reach of children. Store at room temperature between 15 and 30 degrees C (59 and 86 degrees F). Throw away any unused medication after the expiration date. NOTE: This sheet is a summary. It may not cover all possible information. If you have questions about this medicine, talk to your doctor, pharmacist, or health care provider.  2024 [employer]vier/Gold Standard (2023-06-10 00:00:00)

## 2024-08-21 NOTE — Progress Notes (Signed)
 Provider:  Dedra Gores, MD  Primary Care Physician:  Jason Leita Repine, FNP [address] [place] [address]     Referring Provider: Jason Leita Repine, Fnp [address] [place],  [place] [address]          Chief Complaint according to patient   Patient presents with:     New Patient (Initial Visit)            HISTORY OF PRESENT ILLNESS:  [name] [name] is a 73 y.o. female patient who is seen upon Jason Leita Repine, Fnp [address] [place],  [place] [address]'d referral on 08/21/2024  for an Evaluation of COGNITIVE CONCERNS .    I have the pleasure of seeing [name] [name] on 08/21/24, a right -handed female who reports difficulties with anxiety, depression and  memory, Chief concern ( according to patient)  :  I am under stress, I feel overwhelmed with a lot of things, My mother and grandmother and all their sisters had dementia. I don't sleep .  My daughter moved into my house 3 years ago and it has made me crazy, my grandson has torn up my house, he is autistic.   I live in chaos for three years .  She is  also in conflict with her daughters inattention to the destruction of her home and yet loves her grandson, he is 61 years old.   She endorsed the Depression score at 8/ 15 points !!!  ADL : all endorsed as independent.      08/21/2024    2:30 PM  MMSE - Mini Mental State Exam  Orientation to time 5  Orientation to Place 5  Registration 3  Attention/ Calculation 4  Recall 0  Language- name 2 objects 2  Language- repeat 1  Language- follow 3 step command 3  Language- read & follow direction 1  Write a sentence 1  Copy design 0  Total score 26        08/21/2024    2:21 PM  Montreal Cognitive Assessment Blind  Attention: Read list of digits (0/2) 2  Attention: Read list of letters (0/1) 1  Attention: Serial 7 subtraction starting at 100 (0/3) 3  Language: Repeat  phrase (0/2) 1  Language : Fluency (0/1) 1  Abstraction (0/2) 1  Delayed Recall (0/5) 3  Orientation (0/6) 6  Total 23         The following examples were provided in 2019 :  Med History: There have been no traumatic brain injuries, prolonged surgeries under general anesthesia, radiation or chemotherapy,  substance abuse, or mental illness.  ( Reviewed evidence  or documentation of : PTSD, Trauma such as TBI/ whiplash,  Autoimmune disorders, cancer, Vitamin deficiency,  Anemia, malabsorption,  GERD,  Thyroid  disease or other endocrinological disorders, Substance Abuse, Mood disorders, Depression.) Current medications as listed did not contain anticholinergic substances, narcotics, benzodiazepines or sedatives.   The patient has adequate hearing and vision ability.  No loss of smell.    [name] [name] is reporting independence in the following activities of daily living: activities of daily living   The patient drives/ or member of the household drives:  He/She goes shopping or orders items for daily needs, has access to fresh food in the home, and eats regular meals.  Meals are prepared by the patient or in the household.  The patient eats out. The patient eats processed food : fast food or canned food, frozen meals.   Communication: able to use a land line telephone, a mobile phone, or a computer. He/She makes appointments by phone or online and keeps appointments.  Reports independence in bill paying, banking, and files paperwork such as for insurance or taxes.  Dressing, toileting, bathing, feeding, independently-   Ambulation : Falls ? Syncope?  ROM restriction:  she uses a cane.      Family history : Impaired cognitive function or dementia were affecting the following biological  family members:      Social history: Patient attended HS years of education,  2 years vocational college worked in Restaurant manager, fast food  , and reported no difficulties with learning, attention, vision  or hearing at the time of his training.   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  Nicotine use; dip until 6 months ago.   ETOH use ; none ,  Substance use otherwise:  Caffeine intake in form of Coffee(/ ) Soda( pepsi - 4 a day  ) Tea ( /) or energy drinks.  Physical activity in form of walking .   Hobbies and Social activities:    The referring physician has kindly provided the following clinical history information and evolution of symptoms , imaging  and test results:    B 12, TSH, CBC, CMET, Vit D.        Review of Systems: Out of a complete 14 system review, the patient complains of only the following symptoms, and all other reviewed systems are negative.:  See above    Social History   Socioeconomic History   Marital status: Divorced    Spouse name: Not on file   Number of children: 3   Years of education: Not on file   Highest education level: Not on file  Occupational History   Occupation: Youth worker    Employer: [employer]  Tobacco Use   Smoking status: Former    Current packs/day: 0.00    Types: Cigarettes    Quit date: 05/31/1979    Years since quitting: 45.2   Smokeless tobacco: Former    Types: Snuff    Quit date: 2024   Tobacco comments:    social smoker  Vaping Use   Vaping status: Never Used  Substance and Sexual Activity   Alcohol use: Not Currently    Comment: 2004   Drug use: Not Currently    Types: Marijuana    Comment: mariuana years ago   Sexual activity: Not Currently    Birth control/protection: Post-menopausal  Other Topics Concern   Not on file  Social History Narrative   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)   Divorced   3 children   Watches grandchildren for daughter in [place] school   No EtOH, tobacco, drugs   Social Drivers of Corporate investment banker Strain: Low Risk  (09/23/2020)   Overall Financial Resource Strain (CARDIA)    Difficulty of Paying Living Expenses: Not hard at  all  Food Insecurity: No Food Insecurity (09/23/2020)   Hunger Vital Sign    Worried About Running Out of Food in the Last Year: Never true    Ran Out of Food in the Last Year: Never true  Transportation Needs: No Transportation Needs (09/23/2020)   PRAPARE - Administrator, Civil Service (Medical): No    Lack of Transportation (Non-Medical): No  Physical Activity: Sufficiently Active (  09/23/2020)   Exercise Vital Sign    Days of Exercise per Week: 5 days    Minutes of Exercise per Session: 30 min  Stress: No Stress Concern Present (09/23/2020)   [employer] of Occupational Health - Occupational Stress Questionnaire    Feeling of Stress : Not at all  Social Connections: Unknown (09/23/2020)   Social Connection and Isolation Panel    Frequency of Communication with Friends and Family: More than three times a week    Frequency of Social Gatherings with Friends and Family: More than three times a week    Attends Religious Services: More than 4 times per year    Active Member of Clubs or Organizations: Yes    Attends Engineer, structural: More than 4 times per year    Marital Status: Patient declined    Family History  Problem Relation Age of Onset   Hypertension Mother 72   Heart disease Mother    Heart failure Mother 72   Colon polyps Brother    Hypertension Brother    Colon cancer Brother 41   Dementia Maternal Grandmother    Breast cancer Cousin    Hyperlipidemia Other    Hypertension Other    Coronary artery disease Other    Esophageal cancer Neg Hx    Rectal cancer Neg Hx    Stomach cancer Neg Hx     Past Medical History:  Diagnosis Date   Acid reflux    ALCOHOL ABUSE, HX OF 11/06/2007   ALLERGIC RHINITIS 11/06/2007   Allergy    Anxiety 04/03/2011   ASTHMA 09/13/2007   Asthma    Cataract    Chronic pain syndrome 12/15/2016   COLONIC POLYPS, HX OF 02/09/2008    ADENOMATOUS POLYP   Coronary artery disease    Depression    Encounter  for well adult exam without abnormal findings 04/03/2011   HYPERLIPIDEMIA 11/03/2010   HYPERTENSION 09/13/2007   HYPOTHYROIDISM 11/06/2007   Impaired glucose tolerance 07/30/2013   INSOMNIA, HX OF 09/13/2007   Lumbar degenerative disc disease 12/15/2016   OSTEOPOROSIS 11/06/2007   PAD (peripheral artery disease) (HCC)    PERIMENOPAUSAL STATUS 09/13/2007   RLS (restless legs syndrome)    Stroke (HCC)    Substance abuse (HCC)    TIA (transient ischemic attack) 11/04/2011    Past Surgical History:  Procedure Laterality Date   ABDOMINAL AORTOGRAM W/LOWER EXTREMITY N/A 08/27/2020   Procedure: ABDOMINAL AORTOGRAM W/LOWER EXTREMITY;  Surgeon: Elmira Newman PARAS, MD;  Location: [facility];  Service: Cardiovascular;  Laterality: N/A;   BREAST EXCISIONAL BIOPSY Left    BREAST SURGERY  2008 and 2012   x 2 - benign, left side   carotid artery surgery Left    CESAREAN SECTION     x 3   COLONOSCOPY  multiple   2010   PERIPHERAL VASCULAR BALLOON ANGIOPLASTY  08/27/2020   Procedure: PERIPHERAL VASCULAR BALLOON ANGIOPLASTY;  Surgeon: Elmira Newman PARAS, MD;  Location: [facility];  Service: Cardiovascular;;  Right SFA scoring balloon   SHOULDER ARTHROSCOPY WITH SUBACROMIAL DECOMPRESSION, ROTATOR CUFF REPAIR AND BICEP TENDON REPAIR Right 06/08/2023   Procedure: RIGHT SHOULDER ARTHROSCOPY, DEBRIDEMENT, MINI OPEN ROTATOR CUFF TEAR REPAIR AND BICEPS TENODESIS;  Surgeon: Addie Cordella Hamilton, MD;  Location: [facility] OR;  Service: Orthopedics;  Laterality: Right;   TRANSFORAMINAL LUMBAR INTERBODY FUSION (TLIF) WITH PEDICLE SCREW FIXATION 1 LEVEL Left 07/10/2020   Procedure: LEFT-SIDED LUMBAR FOUR-FIVE TRANSFORAMINAL LUMBAR INTERBODY FUSION WITH INSTRUMENTATION AND ALLOGRAFT;  Surgeon: Beuford Anes, MD;  Location: [facility] OR;  Service: Orthopedics;  Laterality: Left;     Current Outpatient Medications on File Prior to Visit  Medication Sig Dispense Refill   acitretin (SORIATANE) 25 MG capsule Take  25 mg by mouth daily.     Aspirin -Acetaminophen -Caffeine (GOODY HEADACHE PO) Take by mouth.     Calcium  Carbonate-Vit D-Min (CALCIUM  1200 PO) Take 1,200 mg by mouth daily.     Cholecalciferol  (VITAMIN D ) 50 MCG (2000 UT) tablet Take 2,000 Units by mouth daily.     citalopram  (CELEXA ) 20 MG tablet TAKE 1 TABLET BY MOUTH EVERY DAY 90 tablet 1   clopidogrel  (PLAVIX ) 75 MG tablet Take 1 tablet (75 mg total) by mouth daily. 90 tablet 1   diltiazem  (CARDIZEM  CD) 180 MG 24 hr capsule Take 1 capsule (180 mg total) by mouth daily. 90 capsule 3   ezetimibe  (ZETIA ) 10 MG tablet Take 1 tablet (10 mg total) by mouth daily. 90 tablet 2   levothyroxine  (SYNTHROID ) 88 MCG tablet TAKE 1 TABLET BY MOUTH EVERY DAY BEFORE BREAKFAST 90 tablet 3   losartan  (COZAAR ) 25 MG tablet Take 1 tablet (25 mg total) by mouth every evening. 90 tablet 3   methocarbamol  (ROBAXIN ) 500 MG tablet TAKE 1 TABLET BY MOUTH EVERY 12 HOURS AS NEEDED 30 tablet 0   montelukast  (SINGULAIR ) 10 MG tablet TAKE 1 TABLET BY MOUTH EVERY DAY 90 tablet 0   Multiple Vitamin (MULTIVITAMIN WITH MINERALS) TABS tablet Take 1 tablet by mouth daily.     Omega-3 Fatty Acids (FISH OIL) 1000 MG CAPS Take 1,000 mg by mouth daily.     rOPINIRole  (REQUIP ) 0.5 MG tablet TAKE 1 TABLET BY MOUTH EVERYDAY AT BEDTIME 90 tablet 1   rosuvastatin  (CRESTOR ) 40 MG tablet Take 1 tablet (40 mg total) by mouth daily. 90 tablet 3   zolpidem  (AMBIEN ) 10 MG tablet TAKE 1 TABLET BY MOUTH AT BEDTIME AS NEEDED FOR SLEEP. 90 tablet 0   No current facility-administered medications on file prior to visit.    Allergies  Allergen Reactions   Atorvastatin  Other (See Comments)    Arthralgia and myalgia   Benazepril  Swelling   Lisinopril  Cough   Fosamax [Alendronate Sodium] Nausea And Vomiting   Penicillins Hives    Did it involve swelling of the face/tongue/throat, SOB, or low BP? No Did it involve sudden or severe rash/hives, skin peeling, or any reaction on the inside of your  mouth or nose? Yes Did you need to seek medical attention at a hospital or doctor's office? Yes When did it last happen? Over 10 years ago If all above answers are NO, may proceed with cephalosporin use.   Trazodone  And Nefazodone Other (See Comments)    Pt is unsure of reaction, possibly made her feel spaced out?     DIAGNOSTIC DATA (LABS, IMAGING, TESTING) - I reviewed patient records, labs, notes, testing and imaging myself where available.  Lab Results  Component Value Date   WBC 6.3 04/19/2024   HGB 12.6 04/19/2024   HCT 37.9 04/19/2024   MCV 95.2 04/19/2024   PLT 213.0 04/19/2024      Component Value Date/Time   NA 139 04/19/2024 1311   NA 141 06/17/2021 1651   K 3.9 04/19/2024 1311   CL 101 04/19/2024 1311   CO2 30 04/19/2024 1311   GLUCOSE 91 04/19/2024 1311   GLUCOSE 101 (H) 10/29/2006 1025   BUN 15 04/19/2024 1311   BUN 22 06/17/2021 1651  CREATININE 0.98 04/19/2024 1311   CALCIUM  9.2 04/19/2024 1311   PROT 6.6 04/19/2024 1311   PROT 6.5 06/17/2021 1651   ALBUMIN 4.2 04/19/2024 1311   ALBUMIN 4.2 06/17/2021 1651   AST 20 04/19/2024 1311   ALT 16 04/19/2024 1311   ALKPHOS 97 04/19/2024 1311   BILITOT 0.5 04/19/2024 1311   BILITOT 0.4 06/17/2021 1651   GFRNONAA >60 05/31/2023 1520   GFRAA 80 08/08/2020 1408   Lab Results  Component Value Date   CHOL 195 04/26/2024   HDL 91 04/26/2024   LDLCALC 93 04/26/2024   LDLDIRECT 72 10/17/2019   TRIG 60 04/26/2024   CHOLHDL 2.1 04/26/2024   Lab Results  Component Value Date   HGBA1C 6.2 02/15/2024   Lab Results  Component Value Date   VITAMINB12 1,092 (H) 04/19/2024   Lab Results  Component Value Date   TSH 2.69 04/19/2024    PHYSICAL EXAM:  Tabular:  No data found.   Body mass index is 31.51 kg/m.   Wt Readings from Last 3 Encounters:  08/21/24 156 lb (70.8 kg)  06/14/24 151 lb (68.5 kg)  06/06/24 151 lb 6.4 oz (68.7 kg)     Ht Readings from Last 3 Encounters:  08/21/24 4' 11 (1.499  m)  06/14/24 4' 11 (1.499 m)  06/06/24 4' 11 (1.499 m)      Cardiovascular:  Regular rate and cardiac rhythm by pulse,  without distended neck veins. Respiratory: no tachypnoea or wheezing. .  Skin:  With evidence of ankle edema,. The patient's posture was stooped    NEUROLOGIC EXAM: The patient was awake and alert, oriented to place and time.   Attention span & concentration ability appeared normal.  Speech was fluent, without dysarthria, dysphonia or aphasia, and of normal volume.     Cranial nerves:  There was no  loss of smell or taste reported  Pupils are round, equal in size and briskly reactive to light.  Funduscopic exam was deferred..  Extraocular movements in vertical and horizontal planes were intact and without nystagmus. (No Diplopia reported). Visual fields by finger perimetry are intact. Hearing was intact to soft voice.    Facial sensation intact ( fine touch).  Facial motor strength: Symmetric movement and tongue and uvula move midline.  Neck ROM: rotation, tilt and flexion /extension were observed,  shoulder shrug was symmetrical.    Motor exam:  Symmetric bulk, strength and ROM.   Muscle tone was  without cog- wheeling, and there was symmetric grip strength.   Sensory:  Fine touch and vibration were tested by tuning fork and intact.  Proprioception tested in the upper extremities was normal.   Coordination: The patient reported no problems with button closure and no changes to penmanship.   The Finger-to-nose maneuver was intact without evidence of ataxia, dysmetria or tremor.   Gait and station: Patient could rise unassisted from a seated position, and walked with a can for  assistive device.  Stance was of normal/ wide width. Back pain affects her  gait.  She reports she leans on the shopping cart when  in a marked- and that's helping the pain    Deep tendon reflexes: Upper extremities did show symmetric DTRs. Lower extremity DTRs were symmetric and  brisk/ attenuated.   Babinski response was deferred .    ASSESSMENT  :   Dear Jason, Leita Repine, Fnp [address] [place],  [place] [address],   Thank you for entrusting me with your  patient's care.   As you know, your patient  [name] [name]  a 73 y.o. female  who presented here on 08-21-2024  for an Evaluation of unspecified memory concerns  upon your request .  In summary, she was assessed with the following  :    1) MCI -yes there are some mild cognitive impairments noted, but this patient was able to do the serial 7 calculation in her mind she was fully oriented to time and place she was unable to recall 3 out of 3 words on the Mini-Mental exam and only lost 3 points  2) Depression - this causing her to feel overwhelmed and anxious, partly due to an unfavorable home situation.   3) MOCA 23/ 30 , and MMSE 26/ 30 -   My Plan is to proceed with:   1) referral for depression treatment .  2)  EEG ordered  5) MRI brain with and without  6 ) Start Donepezil  (unless patient has bradycardia , IBS or REM BD) at 5 mg for 90 days, then increase to 10 mg- if tolerated)  the patient and her daughter declined this medication.  If thereis no memory improvement under depression treatment, will go for ATN , dementia panel.      The plan is to identify cognitive domains affected by changes, to rule out brain lesions or vascular abnormalities, to obtain testing of  AD bio-markers, genetic markers and , if applicable , consolidate these findings by a PET scan.  Serial testing  by [facility] or MMSE test is needed for all patients that are currently scoring in the subjective memory loss, MCI, or mild dementia category.   A referral for neuropsychological interview and testing battery  has been ordered.  Medication to help slow cognitive decline are available but may not be tolerated, these are available in oral and patch form and their use is not specific to a single form of  neurodegenerative cognitive disorder.   Early stages and mild stages of AD may qualify for infusion therapy, based on MOCA/MMSE score and risk status for brain bleeding (AIDA) .   I plan to follow up  through our NP or personally within 6-7  month.    The patient's condition requires frequent monitoring and adjustments in the treatment plan, reflecting the ongoing complexity of care.  This provider is for the named interval time the continuing focal point for associated medical /neurological needs services for this condition.   A total time of  55  minutes consistent of a part of face to face encounter , exam and interview,  and additional preparation time for chart review was spent. Additionally, the following were reviewed: Past medical records, past medical and surgical history, family and social background, as well as relevant laboratory results, imaging findings, and medical notes, where applicable.  At today's visit, we discussed treatment options, associated risk and benefits, and engage in counseling as needed: Including, but not limited to driving safety, home safety, the benefit of routines and activities.   Memory strategies were provided in the patient education attachment.   This note was generated in part by using dictation software, and as a result, it may contain unintentional typos and errors.  Nevertheless, effort was made to accurately convey the pertinent aspects of the patient's visit.    Electronically signed by: Dedra Gores, MD 08/21/2024 2:35 PM  [facility] and [facility] Board certified by The [employer] of Sleep Medicine and Diplomate of the [employer]  Academy of Sleep Medicine. Board certified In Neurology through the ABPN, Fellow of the [employer] of Neurology. [facility]@ GNA.    Tips for Healthy Aging:   Read the MIND DIET book for nutritional information.  Regular physical activity and daylight exposure. This lifts the  mood and entrains a circadian rhythm, leading to better sleep and frees up the mind.   Walking a minimum of  20 minutes a day , if possible outdoors, preferable in a park or nature area.  Indoor exercised such as stretching , yoga , stationary bike or stair master ( at the gym). Read !  Keep up with daily events , news.  Maintain or establish new Hobbies , consider Volunteering, and consider becoming a member of a club, church or other community memberships and engagements. Social interactions give purpose, joy and are interactive - Interaction is brain jogging!   Reconsider your driving ability-   Reconsider your home : The home is a single storey ( ranch/ apartment) ?  If you live in a multistory residence, are all stairs equipped with solid handrails on both sides ?  Can any existing staircase be retrofitted with a stair lift .  Do you need an entry ramp ?  The home that is suited to aging in space has low or no barriers to enter , to reach the lavatory  ( wide doors , high toilet seat, handrails), has a no barrier shower ( no tub ) with a scold guard water temperature setting, handheld shower head , and a freestanding shower seat ( consider a plastic garden armchair !) .   And are the doors wide enough to pass with walker or wheelchair?  Is there enough space in the bedroom to reach the bed from two or better three sides.  De- clutter and arrange your home for safety: place objects at eye height , not above- not below hip height-  you should not need to use a stepstool or ladder.   No overlapping area rugs, sufficient light, and passage space free of furniture , remove breakable items from side tables, night stands.  A Kitchen stovetop powered by induction prevents injuries and fires- replace gas tops and toaster ovens.

## 2024-08-22 ENCOUNTER — Encounter: Payer: Self-pay | Admitting: Family

## 2024-08-22 ENCOUNTER — Telehealth (INDEPENDENT_AMBULATORY_CARE_PROVIDER_SITE_OTHER): Admitting: Family

## 2024-08-22 ENCOUNTER — Telehealth: Payer: Self-pay | Admitting: Neurology

## 2024-08-22 VITALS — Ht 59.0 in

## 2024-08-22 DIAGNOSIS — J45909 Unspecified asthma, uncomplicated: Secondary | ICD-10-CM

## 2024-08-22 MED ORDER — HYDROCODONE BIT-HOMATROP MBR 5-1.5 MG/5ML PO SOLN: 5.0000 mL | Freq: Three times a day (TID) | ORAL | 0 refills | Status: DC | PRN

## 2024-08-22 MED ORDER — AZITHROMYCIN 250 MG PO TABS: ORAL_TABLET | ORAL | 0 refills | Status: AC

## 2024-08-22 MED ORDER — PREDNISONE 20 MG PO TABS: 20.0000 mg | ORAL_TABLET | Freq: Every day | ORAL | 0 refills | Status: DC

## 2024-08-22 NOTE — Telephone Encounter (Signed)
MRI order sent to [facility] [phone]

## 2024-08-22 NOTE — Progress Notes (Signed)
 [name] [name] is a 73 y.o. female with the following history as recorded in [employer]:  Patient Active Problem List   Diagnosis Date Noted   Grief reaction with prolonged bereavement 08/21/2024   MCI (mild cognitive impairment) 08/21/2024   Family history of dementia 08/21/2024   Complete tear of right rotator cuff 06/13/2023   Biceps tendonitis on right 06/13/2023   Synovitis of right shoulder 06/13/2023   Claudication in peripheral vascular disease (HCC) 08/26/2020   Neurogenic claudication 07/10/2020   Degenerative lumbar spinal stenosis 05/16/2020   Sacroiliac pain 08/10/2019   Depression 05/11/2019   Chest pain 05/09/2019   Arthritis of sacroiliac joint of both sides (HCC) 05/01/2019   Polyarthralgia 12/28/2018   Preventative health care 10/20/2018   RLS (restless legs syndrome) 09/27/2018   Insomnia disorder related to known organic factor 06/27/2018   PLMD (periodic limb movement disorder) 06/27/2018   Hot flashes due to menopause 04/25/2018   Palpitations 04/25/2018   Diaphoresis 04/25/2018   Long term current use of antithrombotics/antiplatelets 04/19/2018   Insomnia 04/19/2018   Intractable episodic headache 02/28/2018   Numbness and tingling of right arm 02/28/2018   Toe pain, right 10/19/2017   Rotator cuff arthropathy of left shoulder 06/02/2017   Trigger thumb of left hand 06/02/2017   Lipoma 05/06/2017   Pain of left thumb 05/06/2017   Chronic pain syndrome 12/15/2016   Lumbar degenerative disc disease 12/15/2016   Left leg pain 01/28/2016   Left shoulder pain 04/09/2015   Impaired glucose tolerance 07/30/2013   Abnormal breath sounds 07/30/2013   Asymptomatic bilateral carotid artery stenosis 05/05/2012   Anxiety 04/03/2011   Cerebral infarction (HCC) 03/15/2011   HLD (hyperlipidemia) 11/03/2010   Hx of adenomatous colonic polyps 02/09/2008   Hypothyroidism 11/06/2007   ALLERGIC RHINITIS 11/06/2007   OSTEOPOROSIS 11/06/2007   ALCOHOL ABUSE, HX OF  11/06/2007   Essential hypertension 09/13/2007   Asthma 09/13/2007   PERIMENOPAUSAL STATUS 09/13/2007   Chronic insomnia 09/13/2007    Current Outpatient Medications  Medication Sig Dispense Refill   azithromycin  (ZITHROMAX ) 250 MG tablet Take 2 tablets on day 1, then 1 tablet daily on days 2 through 5 6 tablet 0   predniSONE  (DELTASONE ) 20 MG tablet Take 1 tablet (20 mg total) by mouth daily with breakfast. 5 tablet 0   acitretin (SORIATANE) 25 MG capsule Take 25 mg by mouth daily.     Aspirin -Acetaminophen -Caffeine (GOODY HEADACHE PO) Take by mouth.     Calcium  Carbonate-Vit D-Min (CALCIUM  1200 PO) Take 1,200 mg by mouth daily.     Cholecalciferol  (VITAMIN D ) 50 MCG (2000 UT) tablet Take 2,000 Units by mouth daily.     citalopram  (CELEXA ) 20 MG tablet TAKE 1 TABLET BY MOUTH EVERY DAY 90 tablet 1   clopidogrel  (PLAVIX ) 75 MG tablet Take 1 tablet (75 mg total) by mouth daily. 90 tablet 1   diltiazem  (CARDIZEM  CD) 180 MG 24 hr capsule Take 1 capsule (180 mg total) by mouth daily. 90 capsule 3   ezetimibe  (ZETIA ) 10 MG tablet Take 1 tablet (10 mg total) by mouth daily. 90 tablet 2   HYDROcodone  bit-homatropine (HYCODAN) 5-1.5 MG/5ML syrup Take 5 mLs by mouth every 8 (eight) hours as needed for cough. 120 mL 0   levothyroxine  (SYNTHROID ) 88 MCG tablet TAKE 1 TABLET BY MOUTH EVERY DAY BEFORE BREAKFAST 90 tablet 3   losartan  (COZAAR ) 25 MG tablet Take 1 tablet (25 mg total) by mouth every evening. 90 tablet 3   methocarbamol  (ROBAXIN ) 500  MG tablet TAKE 1 TABLET BY MOUTH EVERY 12 HOURS AS NEEDED 30 tablet 0   montelukast  (SINGULAIR ) 10 MG tablet TAKE 1 TABLET BY MOUTH EVERY DAY 90 tablet 0   Multiple Vitamin (MULTIVITAMIN WITH MINERALS) TABS tablet Take 1 tablet by mouth daily.     Omega-3 Fatty Acids (FISH OIL) 1000 MG CAPS Take 1,000 mg by mouth daily.     rOPINIRole  (REQUIP ) 0.5 MG tablet TAKE 1 TABLET BY MOUTH EVERYDAY AT BEDTIME 90 tablet 1   rosuvastatin  (CRESTOR ) 40 MG tablet Take 1  tablet (40 mg total) by mouth daily. 90 tablet 3   zolpidem  (AMBIEN ) 10 MG tablet TAKE 1 TABLET BY MOUTH AT BEDTIME AS NEEDED FOR SLEEP. 90 tablet 0   No current facility-administered medications for this visit.    Allergies: Atorvastatin , Benazepril , Lisinopril , Fosamax [alendronate sodium], Penicillins, and Trazodone  and nefazodone  Past Medical History:  Diagnosis Date   Acid reflux    ALCOHOL ABUSE, HX OF 11/06/2007   ALLERGIC RHINITIS 11/06/2007   Allergy    Anxiety 04/03/2011   ASTHMA 09/13/2007   Asthma    Cataract    Chronic pain syndrome 12/15/2016   COLONIC POLYPS, HX OF 02/09/2008    ADENOMATOUS POLYP   Coronary artery disease    Depression    Encounter for well adult exam without abnormal findings 04/03/2011   HYPERLIPIDEMIA 11/03/2010   HYPERTENSION 09/13/2007   HYPOTHYROIDISM 11/06/2007   Impaired glucose tolerance 07/30/2013   INSOMNIA, HX OF 09/13/2007   Lumbar degenerative disc disease 12/15/2016   OSTEOPOROSIS 11/06/2007   PAD (peripheral artery disease) (HCC)    PERIMENOPAUSAL STATUS 09/13/2007   RLS (restless legs syndrome)    Stroke (HCC)    Substance abuse (HCC)    TIA (transient ischemic attack) 11/04/2011    Past Surgical History:  Procedure Laterality Date   ABDOMINAL AORTOGRAM W/LOWER EXTREMITY N/A 08/27/2020   Procedure: ABDOMINAL AORTOGRAM W/LOWER EXTREMITY;  Surgeon: Elmira Newman PARAS, MD;  [facility];  Service: Cardiovascular;  Laterality: N/A;   BREAST EXCISIONAL BIOPSY Left    BREAST SURGERY  2008 and 2012   x 2 - benign, left side   carotid artery surgery Left    CESAREAN SECTION     x 3   COLONOSCOPY  multiple   2010   PERIPHERAL VASCULAR BALLOON ANGIOPLASTY  08/27/2020   Procedure: PERIPHERAL VASCULAR BALLOON ANGIOPLASTY;  Surgeon: Elmira Newman PARAS, MD;  Location: [facility];  Service: Cardiovascular;;  Right SFA scoring balloon   SHOULDER ARTHROSCOPY WITH SUBACROMIAL DECOMPRESSION, ROTATOR CUFF REPAIR  AND BICEP TENDON REPAIR Right 06/08/2023   Procedure: RIGHT SHOULDER ARTHROSCOPY, DEBRIDEMENT, MINI OPEN ROTATOR CUFF TEAR REPAIR AND BICEPS TENODESIS;  Surgeon: Addie Cordella Hamilton, MD;  Location: [facility];  Service: Orthopedics;  Laterality: Right;   TRANSFORAMINAL LUMBAR INTERBODY FUSION (TLIF) WITH PEDICLE SCREW FIXATION 1 LEVEL Left 07/10/2020   Procedure: LEFT-SIDED LUMBAR FOUR-FIVE TRANSFORAMINAL LUMBAR INTERBODY FUSION WITH INSTRUMENTATION AND ALLOGRAFT;  Surgeon: Beuford Anes, MD;  Location: [facility];  Service: Orthopedics;  Laterality: Left;    Family History  Problem Relation Age of Onset   Hypertension Mother 60   Heart disease Mother    Heart failure Mother 62   Colon polyps Brother    Hypertension Brother    Colon cancer Brother 60   Dementia Maternal Grandmother    Breast cancer Cousin    Hyperlipidemia Other    Hypertension Other    Coronary artery disease Other  Esophageal cancer Neg Hx    Rectal cancer Neg Hx    Stomach cancer Neg Hx     Social History   Tobacco Use   Smoking status: Former    Current packs/day: 0.00    Types: Cigarettes    Quit date: 05/31/1979    Years since quitting: 45.2   Smokeless tobacco: Former    Types: Snuff    Quit date: 2024   Tobacco comments:    social smoker  Substance Use Topics   Alcohol use: Not Currently    Comment: 2004    Subjective:    I connected with Rani B Kilgore on 08/22/24 at  1:40 PM EDT by a video enabled telemedicine application and verified that I am speaking with the correct person using two identifiers.   I discussed the limitations of evaluation and management by telemedicine and the availability of in person appointments. The patient expressed understanding and agreed to proceed. Provider in office/ patient is at home; provider and patient are only 2 people on video call.   8 day history of cough/ congestion; prone to recurrent bronchitis episodes in the spring and fall; no fever, chest pain or shortness  of breath;    Objective:  Vitals:   08/22/24 1335  Height: 4' 11 (1.499 m)    General: Well developed, well nourished, in no acute distress  Skin : Warm and dry.  Head: Normocephalic and atraumatic  Lungs: Respirations unlabored;  Neurologic: Alert and oriented; speech intact; face symmetrical; moves all extremities well; CNII-XII intact without focal deficit   Assessment:  1. Acute asthmatic bronchitis     Plan:  Rx for Z-pak #1 take as directed, Prednisone  and Hycodan; patient has done well on this combination in the past; increase fluids, rest and follow up worse, no better.   No follow-ups on file.  No orders of the defined types were placed in this encounter.   Requested Prescriptions   Signed Prescriptions Disp Refills   azithromycin  (ZITHROMAX ) 250 MG tablet 6 tablet 0    Sig: Take 2 tablets on day 1, then 1 tablet daily on days 2 through 5   predniSONE  (DELTASONE ) 20 MG tablet 5 tablet 0    Sig: Take 1 tablet (20 mg total) by mouth daily with breakfast.   HYDROcodone  bit-homatropine (HYCODAN) 5-1.5 MG/5ML syrup 120 mL 0    Sig: Take 5 mLs by mouth every 8 (eight) hours as needed for cough.

## 2024-08-24 ENCOUNTER — Other Ambulatory Visit: Payer: Self-pay | Admitting: Family

## 2024-08-24 ENCOUNTER — Ambulatory Visit: Admitting: Orthopedic Surgery

## 2024-08-24 DIAGNOSIS — M65342 Trigger finger, left ring finger: Secondary | ICD-10-CM | POA: Diagnosis not present

## 2024-08-24 DIAGNOSIS — M65341 Trigger finger, right ring finger: Secondary | ICD-10-CM

## 2024-08-24 MED ORDER — MONTELUKAST SODIUM 10 MG PO TABS: 10.0000 mg | ORAL_TABLET | Freq: Every day | ORAL | 1 refills | Status: AC

## 2024-08-24 MED ORDER — ROPINIROLE HCL 0.5 MG PO TABS: 0.5000 mg | ORAL_TABLET | Freq: Every day | ORAL | 1 refills | Status: AC

## 2024-08-24 NOTE — Progress Notes (Signed)
 Yolandra B Castilleja - 73 y.o. female MRN [id number]  Date of birth: 03-02-1951  Office Visit Note: Visit Date: 08/24/2024 PCP: Jason Leita Repine, FNP Referred by: Jason Leita Repine,*  Subjective: Chief Complaint  Patient presents with   Right Hand - Pain   Left Hand - Pain   HPI: Jakera B Veloso is a pleasant 73 y.o. female who presents today for evaluation of bilateral ring finger trigger digit that is been present now for multiple months, worsening in nature.  Right greater than left.  She does have a remote history of prior triggering of these digits years ago and underwent cortisone injection, however symptoms have recurred.  She is on Plavix  at baseline.  Overall healthy and active.  Pertinent ROS were reviewed with the patient and found to be negative unless otherwise specified above in HPI.   Visit Reason: bil hand pain-trigger finger bilateral ring finger Duration of symptoms: Multiple months Hand dominance: right Occupation: Retired Diabetic: No Smoking: No Heart/Lung History: No Blood Thinners: Plavix   Prior Testing/EMG: No Injections (Date): 40 years ago Treatments: inj's only Prior Surgery: No  Assessment & Plan: Visit Diagnoses:  1. Trigger finger, right ring finger   2. Trigger finger, left ring finger     Plan: Extensive discussion was had with the patient today regarding her bilateral ring finger trigger digit.  We discussed the etiology and pathophysiology of stenosing tenosynovitis.  We discussed conservative versus surgical treatment modalities.  From a conservative standpoint, we discussed activity modification, splinting, therapy and injections.  From a surgical standpoint, we discussed the possibility for trigger digit release as well as all risk and benefits associated.  Given that patient has trialed conservative treatments such as prior injection with symptoms refractory to conservative care, patient is indicated for bilateral, staged ring  trigger digit release.  She would like to begin with the right side  Risks and benefits of the procedure were discussed, risks including but not limited to infection, bleeding, scarring, stiffness, nerve injury, tendon injury, vascular injury, recurrence of symptoms and need for subsequent operation.  We also discussed the appropriate postoperative protocol and timeframe for return to activities and function.  Forms of anesthesia were also discussed.  Patient expressed understanding.  Understanding the above, she would like to proceed with right ring trigger digit release under IV sedation at the next available date.   Follow-up: No follow-ups on file.   Meds & Orders: No orders of the defined types were placed in this encounter.  No orders of the defined types were placed in this encounter.    Procedures: No procedures performed      Clinical History: No specialty comments available.  She reports that she quit smoking about 45 years ago. Her smoking use included cigarettes. She quit smokeless tobacco use about 20 months ago.  Her smokeless tobacco use included snuff.  Recent Labs    02/15/24 1458  HGBA1C 6.2    Objective:   Vital Signs: There were no vitals taken for this visit.  Physical Exam  Gen: Well-appearing, in no acute distress; non-toxic CV: Regular Rate. Well-perfused. Warm.  Resp: Breathing unlabored on room air; no wheezing. Psych: Fluid speech in conversation; appropriate affect; normal thought process  Ortho Exam Bilateral hand: - Palpable nodule at the A1 pulley of the bilateral ring finger, associated tenderness - Notable clicking with deep flexion of the bilateral ring finger, there is evidence of significant locking with deep flexion, right greater than left - Sensation intact  distally, hand remains warm well-perfused   Imaging: No results found.  Past Medical/Family/Surgical/Social History: Medications & Allergies reviewed per EMR, new medications  updated. Patient Active Problem List   Diagnosis Date Noted   Grief reaction with prolonged bereavement 08/21/2024   MCI (mild cognitive impairment) 08/21/2024   Family history of dementia 08/21/2024   Complete tear of right rotator cuff 06/13/2023   Biceps tendonitis on right 06/13/2023   Synovitis of right shoulder 06/13/2023   Claudication in peripheral vascular disease (HCC) 08/26/2020   Neurogenic claudication 07/10/2020   Degenerative lumbar spinal stenosis 05/16/2020   Sacroiliac pain 08/10/2019   Depression 05/11/2019   Chest pain 05/09/2019   Arthritis of sacroiliac joint of both sides (HCC) 05/01/2019   Polyarthralgia 12/28/2018   Preventative health care 10/20/2018   RLS (restless legs syndrome) 09/27/2018   Insomnia disorder related to known organic factor 06/27/2018   PLMD (periodic limb movement disorder) 06/27/2018   Hot flashes due to menopause 04/25/2018   Palpitations 04/25/2018   Diaphoresis 04/25/2018   Long term current use of antithrombotics/antiplatelets 04/19/2018   Insomnia 04/19/2018   Intractable episodic headache 02/28/2018   Numbness and tingling of right arm 02/28/2018   Toe pain, right 10/19/2017   Rotator cuff arthropathy of left shoulder 06/02/2017   Trigger thumb of left hand 06/02/2017   Lipoma 05/06/2017   Pain of left thumb 05/06/2017   Chronic pain syndrome 12/15/2016   Lumbar degenerative disc disease 12/15/2016   Left leg pain 01/28/2016   Left shoulder pain 04/09/2015   Impaired glucose tolerance 07/30/2013   Abnormal breath sounds 07/30/2013   Asymptomatic bilateral carotid artery stenosis 05/05/2012   Anxiety 04/03/2011   Cerebral infarction (HCC) 03/15/2011   HLD (hyperlipidemia) 11/03/2010   Hx of adenomatous colonic polyps 02/09/2008   Hypothyroidism 11/06/2007   ALLERGIC RHINITIS 11/06/2007   OSTEOPOROSIS 11/06/2007   ALCOHOL ABUSE, HX OF 11/06/2007   Essential hypertension 09/13/2007   Asthma 09/13/2007   PERIMENOPAUSAL  STATUS 09/13/2007   Chronic insomnia 09/13/2007   Past Medical History:  Diagnosis Date   Acid reflux    ALCOHOL ABUSE, HX OF 11/06/2007   ALLERGIC RHINITIS 11/06/2007   Allergy    Anxiety 04/03/2011   ASTHMA 09/13/2007   Asthma    Cataract    Chronic pain syndrome 12/15/2016   COLONIC POLYPS, HX OF 02/09/2008    ADENOMATOUS POLYP   Coronary artery disease    Depression    Encounter for well adult exam without abnormal findings 04/03/2011   HYPERLIPIDEMIA 11/03/2010   HYPERTENSION 09/13/2007   HYPOTHYROIDISM 11/06/2007   Impaired glucose tolerance 07/30/2013   INSOMNIA, HX OF 09/13/2007   Lumbar degenerative disc disease 12/15/2016   OSTEOPOROSIS 11/06/2007   PAD (peripheral artery disease) (HCC)    PERIMENOPAUSAL STATUS 09/13/2007   RLS (restless legs syndrome)    Stroke (HCC)    Substance abuse (HCC)    TIA (transient ischemic attack) 11/04/2011   Family History  Problem Relation Age of Onset   Hypertension Mother 18   Heart disease Mother    Heart failure Mother 81   Colon polyps Brother    Hypertension Brother    Colon cancer Brother 19   Dementia Maternal Grandmother    Breast cancer Cousin    Hyperlipidemia Other    Hypertension Other    Coronary artery disease Other    Esophageal cancer Neg Hx    Rectal cancer Neg Hx    Stomach cancer Neg Hx    Past  Surgical History:  Procedure Laterality Date   ABDOMINAL AORTOGRAM W/LOWER EXTREMITY N/A 08/27/2020   Procedure: ABDOMINAL AORTOGRAM W/LOWER EXTREMITY;  Surgeon: Elmira Newman PARAS, MD;  [facility]: [facility];  Service: Cardiovascular;  Laterality: N/A;   BREAST EXCISIONAL BIOPSY Left    BREAST SURGERY  2008 and 2012   x 2 - benign, left side   carotid artery surgery Left    CESAREAN SECTION     x 3   COLONOSCOPY  multiple   2010   PERIPHERAL VASCULAR BALLOON ANGIOPLASTY  08/27/2020   Procedure: PERIPHERAL VASCULAR BALLOON ANGIOPLASTY;  Surgeon: Elmira Newman PARAS, MD;  [facility];  Service: Cardiovascular;;  Right SFA scoring balloon   SHOULDER ARTHROSCOPY WITH SUBACROMIAL DECOMPRESSION, ROTATOR CUFF REPAIR AND BICEP TENDON REPAIR Right 06/08/2023   Procedure: RIGHT SHOULDER ARTHROSCOPY, DEBRIDEMENT, MINI OPEN ROTATOR CUFF TEAR REPAIR AND BICEPS TENODESIS;  Surgeon: Addie Cordella Hamilton, MD;  Location: [facility];  Service: Orthopedics;  Laterality: Right;   TRANSFORAMINAL LUMBAR INTERBODY FUSION (TLIF) WITH PEDICLE SCREW FIXATION 1 LEVEL Left 07/10/2020   Procedure: LEFT-SIDED LUMBAR FOUR-FIVE TRANSFORAMINAL LUMBAR INTERBODY FUSION WITH INSTRUMENTATION AND ALLOGRAFT;  Surgeon: Beuford Anes, MD;  Location: [facility];  Service: Orthopedics;  Laterality: Left;   Social History   Occupational History   Occupation: Banker: [employer]  Tobacco Use   Smoking status: Former    Current packs/day: 0.00    Types: Cigarettes    Quit date: 05/31/1979    Years since quitting: 45.2   Smokeless tobacco: Former    Types: Snuff    Quit date: 2024   Tobacco comments:    social smoker  Vaping Use   Vaping status: Never Used  Substance and Sexual Activity   Alcohol use: Not Currently    Comment: 2004   Drug use: Not Currently    Types: Marijuana    Comment: mariuana years ago   Sexual activity: Not Currently    Birth control/protection: Post-menopausal    Bruna Dills Estela) Arlinda, M.D. [facility], Hand Surgery

## 2024-08-28 ENCOUNTER — Telehealth: Payer: Self-pay | Admitting: Orthopedic Surgery

## 2024-08-28 NOTE — Telephone Encounter (Signed)
 Patient called and said that she been waiting  on you to call her back because she said she dont know If she supposed to keep taking blood thinners and nobody will call her for surgery. CB#[phone]

## 2024-08-28 NOTE — Telephone Encounter (Signed)
 April are you waiting on clearance to schedule this one?

## 2024-08-30 ENCOUNTER — Telehealth: Payer: Self-pay

## 2024-08-30 NOTE — Telephone Encounter (Signed)
   Pre-operative Risk Assessment    Patient Name: [name] [name]  DOB: March 20, 1951 MRN: [id number]   Date of last office visit: 04/03/24 GORDY BERGAMO, MD Date of next office visit: NONE   Request for Surgical Clearance    Procedure:  RIGHT RING TRIGGER DIGIT RELEASE  Date of Surgery:  Clearance TBD                                Surgeon:  GILDARDO ALDERTON, MD Surgeon's Group or Practice Name:  [facility] Phone number:  [phone] Fax number:  [phone]  ATTN: APRIL   Type of Clearance Requested:   - Medical  - Pharmacy:  Hold Clopidogrel  (Plavix ) 5 DAYS PRIOR   Type of Anesthesia:  MAC   Additional requests/questions:    Signed, Lucie DELENA Ku   08/30/2024, 5:50 PM

## 2024-08-30 NOTE — Telephone Encounter (Addendum)
 FYI: I spoke with the patient on 9/15 and advised her we are waiting on Cardiac clearance to let us  know instructions for her blood thinner before we can schedule surgery. I also advised patient that if she would like to call her Cardiologist to let them know she cannot schedule surgery until we get clearance, she is welcome to do so which might spend up the process. Patient states she would let us  do the leg work. Patient was advised she would be getting a call once clearance has been received.

## 2024-08-31 ENCOUNTER — Telehealth: Payer: Self-pay

## 2024-08-31 NOTE — Telephone Encounter (Signed)
 Patient has been scheduled for televisit med rec and consent done     Patient Consent for Virtual Visit         [name] [name] has provided verbal consent on 08/31/2024 for a virtual visit (video or telephone).   CONSENT FOR VIRTUAL VISIT FOR:  [name] [name]  By participating in this virtual visit I agree to the following:  I hereby voluntarily request, consent and authorize [facility] and its employed or contracted physicians, physician assistants, nurse practitioners or other licensed health care professionals (the Practitioner), to provide me with telemedicine health care services (the "Services) as deemed necessary by the treating Practitioner. I acknowledge and consent to receive the Services by the Practitioner via telemedicine. I understand that the telemedicine visit will involve communicating with the Practitioner through live audiovisual communication technology and the disclosure of certain medical information by electronic transmission. I acknowledge that I have been given the opportunity to request an in-person assessment or other available alternative prior to the telemedicine visit and am voluntarily participating in the telemedicine visit.  I understand that I have the right to withhold or withdraw my consent to the use of telemedicine in the course of my care at any time, without affecting my right to future care or treatment, and that the Practitioner or I may terminate the telemedicine visit at any time. I understand that I have the right to inspect all information obtained and/or recorded in the course of the telemedicine visit and may receive copies of available information for a reasonable fee.  I understand that some of the potential risks of receiving the Services via telemedicine include:  Delay or interruption in medical evaluation due to technological equipment failure or disruption; Information transmitted may not be sufficient (e.g. poor resolution of  images) to allow for appropriate medical decision making by the Practitioner; and/or  In rare instances, security protocols could fail, causing a breach of personal health information.  Furthermore, I acknowledge that it is my responsibility to provide information about my medical history, conditions and care that is complete and accurate to the best of my ability. I acknowledge that Practitioner's advice, recommendations, and/or decision may be based on factors not within their control, such as incomplete or inaccurate data provided by me or distortions of diagnostic images or specimens that may result from electronic transmissions. I understand that the practice of medicine is not an exact science and that Practitioner makes no warranties or guarantees regarding treatment outcomes. I acknowledge that a copy of this consent can be made available to me via my patient portal [facility] MyChart), or I can request a printed copy by calling the office of [facility].    I understand that my insurance will be billed for this visit.   I have read or had this consent read to me. I understand the contents of this consent, which adequately explains the benefits and risks of the Services being provided via telemedicine.  I have been provided ample opportunity to ask questions regarding this consent and the Services and have had my questions answered to my satisfaction. I give my informed consent for the services to be provided through the use of telemedicine in my medical care

## 2024-08-31 NOTE — Telephone Encounter (Signed)
   Name: Modelle Vollmer Early  DOB: 17-Jan-1951  MRN: [id number]  Primary Cardiologist: Gordy Bergamo, MD  Chart reviewed as part of pre-operative protocol coverage. Because of Lasha B Emmitt's past medical history and time since last visit, she will require a follow-up telephone visit in order to better assess preoperative cardiovascular risk.  Pre-op covering staff: - Please schedule appointment and call patient to inform them. If patient already had an upcoming appointment within acceptable timeframe, please add pre-op clearance to the appointment notes so provider is aware. - Please contact requesting surgeon's office via preferred method (i.e, phone, fax) to inform them of need for appointment prior to surgery.  She can hold Plavix  x 5 days prior to procedure as long as asymptomatic at the time of phone call.  Please resume a message to do so.  She lives in [place].  Orren LOISE Fabry, PA-C  08/31/2024, 9:54 AM

## 2024-08-31 NOTE — Telephone Encounter (Signed)
Patient has been scheduled for televisit.

## 2024-09-04 ENCOUNTER — Other Ambulatory Visit: Payer: Self-pay | Admitting: Family Medicine

## 2024-09-04 ENCOUNTER — Encounter: Payer: Self-pay | Admitting: Neurology

## 2024-09-04 DIAGNOSIS — Z1231 Encounter for screening mammogram for malignant neoplasm of breast: Secondary | ICD-10-CM

## 2024-09-05 ENCOUNTER — Ambulatory Visit (INDEPENDENT_AMBULATORY_CARE_PROVIDER_SITE_OTHER)

## 2024-09-05 ENCOUNTER — Ambulatory Visit: Attending: Cardiology | Admitting: Nurse Practitioner

## 2024-09-05 VITALS — Ht 59.0 in | Wt 156.0 lb

## 2024-09-05 DIAGNOSIS — Z0000 Encounter for general adult medical examination without abnormal findings: Secondary | ICD-10-CM | POA: Diagnosis not present

## 2024-09-05 DIAGNOSIS — Z01810 Encounter for preprocedural cardiovascular examination: Secondary | ICD-10-CM | POA: Diagnosis not present

## 2024-09-05 NOTE — Progress Notes (Signed)
 Virtual Visit via Telephone Note   Because of [name] [name] co-morbid illnesses, she is at least at moderate risk for complications without adequate follow up.  This format is felt to be most appropriate for this patient at this time.  Due to technical limitations with video connection (technology), today's appointment will be conducted as an audio only telehealth visit, and [name] [name] verbally agreed to proceed in this manner.   All issues noted in this document were discussed and addressed.  No physical exam could be performed with this format.  Evaluation Performed:  Preoperative cardiovascular risk assessment _____________   Date:  09/05/2024   Patient ID:  [name] [name], DOB 03/25/1951, MRN [id number] Patient Location:  Home Provider location:   Office  Primary Care Provider:  Jason Leita Repine, FNP Primary Cardiologist:  Gordy Bergamo, MD  Chief Complaint / Patient Profile   73 y.o. y/o female with a h/o carotid artery stenosis s/p L CEA, TIA, PAD s/p right SFA angioplasty, hypertension, hyperlipidemia, asthma, and former tobacco use who is pending right ring trigger digit release with Dr. Gildardo Alderton of [facility] and presents today for telephonic preoperative cardiovascular risk assessment.  History of Present Illness    [name] [name] is a 73 y.o. female who presents via audio/video conferencing for a telehealth visit today.  Pt was last seen in cardiology clinic on 04/03/2024 by Dr. Bergamo. At that time [name] [name] was doing well. The patient is now pending procedure as outlined above. Since her last visit, she has done well from a cardiac standpoint.  She has been exercising regularly at the [facility].  She denies chest pain, palpitations, dyspnea, pnd, orthopnea, n, v, dizziness, syncope, edema, weight gain, or early satiety. All other systems reviewed and are otherwise negative except as noted above.   Past Medical History    Past  Medical History:  Diagnosis Date   Acid reflux    ALCOHOL ABUSE, HX OF 11/06/2007   ALLERGIC RHINITIS 11/06/2007   Allergy    Anxiety 04/03/2011   ASTHMA 09/13/2007   Asthma    Cataract    Chronic pain syndrome 12/15/2016   COLONIC POLYPS, HX OF 02/09/2008    ADENOMATOUS POLYP   Coronary artery disease    Depression    Encounter for well adult exam without abnormal findings 04/03/2011   HYPERLIPIDEMIA 11/03/2010   HYPERTENSION 09/13/2007   HYPOTHYROIDISM 11/06/2007   Impaired glucose tolerance 07/30/2013   INSOMNIA, HX OF 09/13/2007   Lumbar degenerative disc disease 12/15/2016   OSTEOPOROSIS 11/06/2007   PAD (peripheral artery disease)    PERIMENOPAUSAL STATUS 09/13/2007   RLS (restless legs syndrome)    Stroke (HCC)    Substance abuse (HCC)    TIA (transient ischemic attack) 11/04/2011   Past Surgical History:  Procedure Laterality Date   ABDOMINAL AORTOGRAM W/LOWER EXTREMITY N/A 08/27/2020   Procedure: ABDOMINAL AORTOGRAM W/LOWER EXTREMITY;  Surgeon: Elmira Newman PARAS, MD;  Location: [facility];  Service: Cardiovascular;  Laterality: N/A;   BREAST EXCISIONAL BIOPSY Left    BREAST SURGERY  2008 and 2012   x 2 - benign, left side   carotid artery surgery Left    CESAREAN SECTION     x 3   COLONOSCOPY  multiple   2010   PERIPHERAL VASCULAR BALLOON ANGIOPLASTY  08/27/2020   Procedure: PERIPHERAL VASCULAR BALLOON ANGIOPLASTY;  Surgeon: Elmira Newman PARAS, MD;  Location: [facility];  Service: Cardiovascular;;  Right SFA scoring balloon   SHOULDER ARTHROSCOPY WITH SUBACROMIAL DECOMPRESSION, ROTATOR CUFF REPAIR AND BICEP TENDON REPAIR Right 06/08/2023   Procedure: RIGHT SHOULDER ARTHROSCOPY, DEBRIDEMENT, MINI OPEN ROTATOR CUFF TEAR REPAIR AND BICEPS TENODESIS;  Surgeon: Addie Cordella Hamilton, MD;  Location: [facility] OR;  Service: Orthopedics;  Laterality: Right;   TRANSFORAMINAL LUMBAR INTERBODY FUSION (TLIF) WITH PEDICLE SCREW FIXATION 1 LEVEL Left 07/10/2020    Procedure: LEFT-SIDED LUMBAR FOUR-FIVE TRANSFORAMINAL LUMBAR INTERBODY FUSION WITH INSTRUMENTATION AND ALLOGRAFT;  Surgeon: Beuford Anes, MD;  Location: [facility] OR;  Service: Orthopedics;  Laterality: Left;    Allergies  Allergies  Allergen Reactions   Atorvastatin  Other (See Comments)    Arthralgia and myalgia   Benazepril  Swelling   Lisinopril  Cough   Fosamax [Alendronate Sodium] Nausea And Vomiting   Penicillins Hives    Did it involve swelling of the face/tongue/throat, SOB, or low BP? No Did it involve sudden or severe rash/hives, skin peeling, or any reaction on the inside of your mouth or nose? Yes Did you need to seek medical attention at a hospital or doctor's office? Yes When did it last happen? Over 10 years ago If all above answers are NO, may proceed with cephalosporin use.   Trazodone  And Nefazodone Other (See Comments)    Pt is unsure of reaction, possibly made her feel spaced out?    Home Medications    Prior to Admission medications   Medication Sig Start Date End Date Taking? Authorizing Provider  acitretin (SORIATANE) 25 MG capsule Take 25 mg by mouth daily. 01/20/22   [provider]  Aspirin -Acetaminophen -Caffeine (GOODY HEADACHE PO) Take by mouth.    [provider]  Calcium  Carbonate-Vit D-Min (CALCIUM  1200 PO) Take 1,200 mg by mouth daily.    [provider]  Cholecalciferol  (VITAMIN D ) 50 MCG (2000 UT) tablet Take 2,000 Units by mouth daily.    [provider]  citalopram  (CELEXA ) 20 MG tablet TAKE 1 TABLET BY MOUTH EVERY DAY 08/17/24   Jason Leita Repine, FNP  clopidogrel  (PLAVIX ) 75 MG tablet Take 1 tablet (75 mg total) by mouth daily. 08/17/24   Ladona Heinz, MD  diltiazem  (CARDIZEM  CD) 180 MG 24 hr capsule Take 1 capsule (180 mg total) by mouth daily. 05/01/24   Ladona Heinz, MD  ezetimibe  (ZETIA ) 10 MG tablet Take 1 tablet (10 mg total) by mouth daily. 06/06/24   Ladona Heinz, MD  HYDROcodone  bit-homatropine (HYCODAN) 5-1.5  MG/5ML syrup Take 5 mLs by mouth every 8 (eight) hours as needed for cough. 08/22/24   Jason Leita Repine, FNP  levothyroxine  (SYNTHROID ) 88 MCG tablet TAKE 1 TABLET BY MOUTH EVERY DAY BEFORE BREAKFAST 02/16/24   Jason Leita Repine, FNP  losartan  (COZAAR ) 25 MG tablet Take 1 tablet (25 mg total) by mouth every evening. 05/01/24   Ladona Heinz, MD  methocarbamol  (ROBAXIN ) 500 MG tablet TAKE 1 TABLET BY MOUTH EVERY 12 HOURS AS NEEDED 08/17/24   Addie Cordella Hamilton, MD  montelukast  (SINGULAIR ) 10 MG tablet Take 1 tablet (10 mg total) by mouth daily. 08/24/24   Jason Leita Repine, FNP  Multiple Vitamin (MULTIVITAMIN WITH MINERALS) TABS tablet Take 1 tablet by mouth daily.    [provider]  Omega-3 Fatty Acids (FISH OIL) 1000 MG CAPS Take 1,000 mg by mouth daily.    [provider]  predniSONE  (DELTASONE ) 20 MG tablet Take 1 tablet (20 mg total) by mouth daily with breakfast. 08/22/24   Jason Leita Repine, FNP  rOPINIRole  (REQUIP ) 0.5 MG tablet Take  1 tablet (0.5 mg total) by mouth at bedtime. 08/24/24   Jason Leita Repine, FNP  rosuvastatin  (CRESTOR ) 40 MG tablet Take 1 tablet (40 mg total) by mouth daily. 04/28/24   Ladona Heinz, MD  zolpidem  (AMBIEN ) 10 MG tablet TAKE 1 TABLET BY MOUTH AT BEDTIME AS NEEDED FOR SLEEP. 08/17/24   Jason Leita Repine, FNP    Physical Exam    Vital Signs:  [name] [name] does not have vital signs available for review today.  Given telephonic nature of communication, physical exam is limited. AAOx3. NAD. Normal affect.  Speech and respirations are unlabored.  Accessory Clinical Findings    None  Assessment & Plan    1.  Preoperative Cardiovascular Risk Assessment:  According to the Revised Cardiac Risk Index (RCRI), her Perioperative Risk of Major Cardiac Event is (%): 0.9. Her Functional Capacity in METs is: 5.81 according to the Duke Activity Status Index (DASI). Therefore, based on ACC/AHA guidelines, patient would be at acceptable  risk for the planned procedure without further cardiovascular testing.  The patient was advised that if she develops new symptoms prior to surgery to contact our office to arrange for a follow-up visit, and she verbalized understanding.  Patient previously cleared by Dr. Ladona to hold Plavix . Per office protocol, she may hold Plavix  for 5 days prior to procedure. Please resume Plavix  as soon as possible postprocedure, at the discretion of the surgeon.   A copy of this note will be routed to requesting surgeon.  Time:   Today, I have spent 5 minutes with the patient with telehealth technology discussing medical history, symptoms, and management plan.     Damien JAYSON Braver, NP  09/05/2024, 11:26 AM

## 2024-09-05 NOTE — Progress Notes (Signed)
 Subjective:   [name] [name] is a 73 y.o. who presents for a Medicare Wellness preventive visit.  As a reminder, Annual Wellness Visits don't include a physical exam, and some assessments may be limited, especially if this visit is performed virtually. We may recommend an in-person follow-up visit with your provider if needed.  Visit Complete: Virtual I connected with  [name] [name] on 09/05/24 by a audio enabled telemedicine application and verified that I am speaking with the correct person using two identifiers.  Patient Location: Home  Provider Location: Home Office  I discussed the limitations of evaluation and management by telemedicine. The patient expressed understanding and agreed to proceed.  Vital Signs: Because this visit was a virtual/telehealth visit, some criteria may be missing or patient reported. Any vitals not documented were not able to be obtained and vitals that have been documented are patient reported.  VideoDeclined- This patient declined Librarian, academic. Therefore the visit was completed with audio only.  Persons Participating in Visit: Patient.  AWV Questionnaire: No: Patient Medicare AWV questionnaire was not completed prior to this visit.  Cardiac Risk Factors include: advanced age (>76men, >62 women);hypertension;dyslipidemia     Objective:    Today's Vitals   09/05/24 1254  Weight: 156 lb (70.8 kg)  Height: 4' 11 (1.499 m)   Body mass index is 31.51 kg/m.     09/05/2024   12:57 PM 07/14/2023   11:39 AM 05/31/2023    3:13 PM 06/03/2022    3:04 PM 09/23/2020    4:25 PM 08/27/2020    6:01 AM 07/10/2020    6:14 AM  Advanced Directives  Does Patient Have a Medical Advance Directive? No Yes No No;Yes Yes No Yes  Type of Aeronautical engineer of [place];Living will Living will  Living will  Does patient want to make changes to medical advance directive?  No - Patient declined   No - Patient  declined  No - Patient declined  Copy of Healthcare Power of Attorney in Chart?    No - copy requested     Would patient like information on creating a medical advance directive? Yes (MAU/Ambulatory/Procedural Areas - Information given)  No - Patient declined No - Patient declined  No - Patient declined     Current Medications (verified) Outpatient Encounter Medications as of 09/05/2024  Medication Sig   acitretin (SORIATANE) 25 MG capsule Take 25 mg by mouth daily.   Aspirin -Acetaminophen -Caffeine (GOODY HEADACHE PO) Take by mouth.   Calcium  Carbonate-Vit D-Min (CALCIUM  1200 PO) Take 1,200 mg by mouth daily.   Cholecalciferol  (VITAMIN D ) 50 MCG (2000 UT) tablet Take 2,000 Units by mouth daily.   citalopram  (CELEXA ) 20 MG tablet TAKE 1 TABLET BY MOUTH EVERY DAY   clopidogrel  (PLAVIX ) 75 MG tablet Take 1 tablet (75 mg total) by mouth daily.   diltiazem  (CARDIZEM  CD) 180 MG 24 hr capsule Take 1 capsule (180 mg total) by mouth daily.   ezetimibe  (ZETIA ) 10 MG tablet Take 1 tablet (10 mg total) by mouth daily.   HYDROcodone  bit-homatropine (HYCODAN) 5-1.5 MG/5ML syrup Take 5 mLs by mouth every 8 (eight) hours as needed for cough.   levothyroxine  (SYNTHROID ) 88 MCG tablet TAKE 1 TABLET BY MOUTH EVERY DAY BEFORE BREAKFAST   losartan  (COZAAR ) 25 MG tablet Take 1 tablet (25 mg total) by mouth every evening.   methocarbamol  (ROBAXIN ) 500 MG tablet TAKE 1 TABLET BY MOUTH EVERY 12 HOURS AS NEEDED  montelukast  (SINGULAIR ) 10 MG tablet Take 1 tablet (10 mg total) by mouth daily.   Multiple Vitamin (MULTIVITAMIN WITH MINERALS) TABS tablet Take 1 tablet by mouth daily.   Omega-3 Fatty Acids (FISH OIL) 1000 MG CAPS Take 1,000 mg by mouth daily.   rOPINIRole  (REQUIP ) 0.5 MG tablet Take 1 tablet (0.5 mg total) by mouth at bedtime.   rosuvastatin  (CRESTOR ) 40 MG tablet Take 1 tablet (40 mg total) by mouth daily.   zolpidem  (AMBIEN ) 10 MG tablet TAKE 1 TABLET BY MOUTH AT BEDTIME AS NEEDED FOR SLEEP.    predniSONE  (DELTASONE ) 20 MG tablet Take 1 tablet (20 mg total) by mouth daily with breakfast. (Patient not taking: Reported on 09/05/2024)   No facility-administered encounter medications on file as of 09/05/2024.    Allergies (verified) Atorvastatin , Benazepril , Lisinopril , Fosamax [alendronate sodium], Penicillins, and Trazodone  and nefazodone   History: Past Medical History:  Diagnosis Date   Acid reflux    ALCOHOL ABUSE, HX OF 11/06/2007   ALLERGIC RHINITIS 11/06/2007   Allergy    Anxiety 04/03/2011   ASTHMA 09/13/2007   Asthma    Cataract    Chronic pain syndrome 12/15/2016   COLONIC POLYPS, HX OF 02/09/2008    ADENOMATOUS POLYP   Coronary artery disease    Depression    Encounter for well adult exam without abnormal findings 04/03/2011   HYPERLIPIDEMIA 11/03/2010   HYPERTENSION 09/13/2007   HYPOTHYROIDISM 11/06/2007   Impaired glucose tolerance 07/30/2013   INSOMNIA, HX OF 09/13/2007   Lumbar degenerative disc disease 12/15/2016   OSTEOPOROSIS 11/06/2007   PAD (peripheral artery disease)    PERIMENOPAUSAL STATUS 09/13/2007   RLS (restless legs syndrome)    Stroke (HCC)    Substance abuse (HCC)    TIA (transient ischemic attack) 11/04/2011   Past Surgical History:  Procedure Laterality Date   ABDOMINAL AORTOGRAM W/LOWER EXTREMITY N/A 08/27/2020   Procedure: ABDOMINAL AORTOGRAM W/LOWER EXTREMITY;  Surgeon: Elmira Newman PARAS, MD;  Location: [facility];  Service: Cardiovascular;  Laterality: N/A;   BREAST EXCISIONAL BIOPSY Left    BREAST SURGERY  2008 and 2012   x 2 - benign, left side   carotid artery surgery Left    CESAREAN SECTION     x 3   COLONOSCOPY  multiple   2010   PERIPHERAL VASCULAR BALLOON ANGIOPLASTY  08/27/2020   Procedure: PERIPHERAL VASCULAR BALLOON ANGIOPLASTY;  Surgeon: Elmira Newman PARAS, MD;  Location: [facility];  Service: Cardiovascular;;  Right SFA scoring balloon   SHOULDER ARTHROSCOPY WITH SUBACROMIAL DECOMPRESSION,  ROTATOR CUFF REPAIR AND BICEP TENDON REPAIR Right 06/08/2023   Procedure: RIGHT SHOULDER ARTHROSCOPY, DEBRIDEMENT, MINI OPEN ROTATOR CUFF TEAR REPAIR AND BICEPS TENODESIS;  Surgeon: Addie Cordella Hamilton, MD;  Location: [facility];  Service: Orthopedics;  Laterality: Right;   TRANSFORAMINAL LUMBAR INTERBODY FUSION (TLIF) WITH PEDICLE SCREW FIXATION 1 LEVEL Left 07/10/2020   Procedure: LEFT-SIDED LUMBAR FOUR-FIVE TRANSFORAMINAL LUMBAR INTERBODY FUSION WITH INSTRUMENTATION AND ALLOGRAFT;  Surgeon: Beuford Anes, MD;  Location: [facility];  Service: Orthopedics;  Laterality: Left;   Family History  Problem Relation Age of Onset   Hypertension Mother 38   Heart disease Mother    Heart failure Mother 82   Colon polyps Brother    Hypertension Brother    Colon cancer Brother 58   Dementia Maternal Grandmother    Breast cancer Cousin    Hyperlipidemia Other    Hypertension Other    Coronary artery disease Other    Esophageal cancer  Neg Hx    Rectal cancer Neg Hx    Stomach cancer Neg Hx    Social History   Socioeconomic History   Marital status: Divorced    Spouse name: Not on file   Number of children: 3   Years of education: Not on file   Highest education level: Not on file  Occupational History   Occupation: Youth worker    Employer: [employer]  Tobacco Use   Smoking status: Former    Current packs/day: 0.00    Types: Cigarettes    Quit date: 05/31/1979    Years since quitting: 45.2   Smokeless tobacco: Former    Types: Snuff    Quit date: 2024   Tobacco comments:    social smoker  Vaping Use   Vaping status: Never Used  Substance and Sexual Activity   Alcohol use: Not Currently    Comment: 2004   Drug use: Not Currently    Types: Marijuana    Comment: mariuana years ago   Sexual activity: Not Currently    Birth control/protection: Post-menopausal  Other Topics Concern   Not on file  Social History Narrative   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)   Divorced    3 children   Watches grandchildren for daughter in [place] school   No EtOH, tobacco, drugs   Social Drivers of Corporate investment banker Strain: Low Risk  (09/05/2024)   Overall Financial Resource Strain (CARDIA)    Difficulty of Paying Living Expenses: Not hard at all  Food Insecurity: No Food Insecurity (09/05/2024)   Hunger Vital Sign    Worried About Running Out of Food in the Last Year: Never true    Ran Out of Food in the Last Year: Never true  Transportation Needs: No Transportation Needs (09/05/2024)   PRAPARE - Administrator, Civil Service (Medical): No    Lack of Transportation (Non-Medical): No  Physical Activity: Insufficiently Active (09/05/2024)   Exercise Vital Sign    Days of Exercise per Week: 3 days    Minutes of Exercise per Session: 30 min  Stress: No Stress Concern Present (09/05/2024)   [employer] of Occupational Health - Occupational Stress Questionnaire    Feeling of Stress: Not at all  Social Connections: Moderately Integrated (09/05/2024)   Social Connection and Isolation Panel    Frequency of Communication with Friends and Family: More than three times a week    Frequency of Social Gatherings with Friends and Family: More than three times a week    Attends Religious Services: More than 4 times per year    Active Member of [employer] or Organizations: Yes    Attends Engineer, structural: More than 4 times per year    Marital Status: Divorced    Tobacco Counseling Counseling given: Not Answered Tobacco comments: social smoker    Clinical Intake:  Pre-visit preparation completed: Yes  Pain : No/denies pain  Diabetes: No  Lab Results  Component Value Date   HGBA1C 6.2 02/15/2024   HGBA1C 6.4 10/16/2019   HGBA1C 6.2 05/01/2019     How often do you need to have someone help you when you read instructions, pamphlets, or other written materials from your doctor or pharmacy?: 1 - Never  Interpreter Needed?: No  Information  entered by :: Charmaine Bloodgood LPN   Activities of Daily Living     09/05/2024   12:57 PM  In your present state of health, do you  have any difficulty performing the following activities:  Hearing? 0  Vision? 0  Difficulty concentrating or making decisions? 0  Walking or climbing stairs? 0  Dressing or bathing? 0  Doing errands, shopping? 0  Preparing Food and eating ? N  Using the Toilet? N  In the past six months, have you accidently leaked urine? N  Do you have problems with loss of bowel control? N  Managing your Medications? N  Managing your Finances? N  Housekeeping or managing your Housekeeping? N    Patient Care Team: Jason Leita Repine, FNP as PCP - General (Internal Medicine) Ladona Heinz, MD as PCP - Cardiology (Cardiology) Dohmeier, Dedra, MD as Consulting Physician (Neurology) Arlinda Buster, MD as Consulting Physician (Orthopedic Surgery)  I have updated your Care Teams any recent Medical Services you may have received from other providers in the past year.     Assessment:   This is a routine wellness examination for [name] [name].  Hearing/Vision screen Hearing Screening - Comments:: Denies hearing difficulties   Vision Screening - Comments:: Wears rx glasses - up to date with routine eye exams     Goals Addressed             This Visit's Progress    Maintain health and independence   On track      Depression Screen     09/05/2024   12:56 PM 02/15/2024    2:27 PM 08/26/2023    8:56 AM 01/12/2023    3:09 PM 11/17/2022   11:58 AM 10/13/2022    2:46 PM 06/03/2022    3:09 PM  PHQ 2/9 Scores  PHQ - 2 Score 0 0 0 0 0 0 0  PHQ- 9 Score  0 0 0 0      Fall Risk     09/05/2024   12:56 PM 01/12/2023    3:09 PM 11/17/2022   11:57 AM 10/13/2022    2:46 PM 06/03/2022    3:05 PM  Fall Risk   Falls in the past year? 0 0 1 1 1   Number falls in past yr: 0 0 1 0 0  Injury with Fall? 0 0 1 1 0  Risk for fall due to : No Fall Risks History of fall(s) History  of fall(s) Impaired balance/gait;Impaired mobility History of fall(s)  Follow up Falls prevention discussed;Education provided;Falls evaluation completed Falls evaluation completed Falls evaluation completed  Falls evaluation completed;Falls prevention discussed  Falls evaluation completed      Data saved with a previous flowsheet row definition    MEDICARE RISK AT HOME:  Medicare Risk at Home Any stairs in or around the home?: No If so, are there any without handrails?: No Home free of loose throw rugs in walkways, pet beds, electrical cords, etc?: Yes Adequate lighting in your home to reduce risk of falls?: Yes Life alert?: No Use of a cane, walker or w/c?: No Grab bars in the bathroom?: Yes Shower chair or bench in shower?: No Elevated toilet seat or a handicapped toilet?: Yes  TIMED UP AND GO:  Was the test performed?  No  Cognitive Function: 6CIT completed    08/21/2024    2:30 PM  MMSE - Mini Mental State Exam  Orientation to time 5  Orientation to Place 5  Registration 3  Attention/ Calculation 4  Recall 0  Language- name 2 objects 2  Language- repeat 1  Language- follow 3 step command 3  Language- read & follow direction 1  Write a sentence  1  Copy design 0  Total score 25      08/21/2024    2:21 PM  Montreal Cognitive Assessment   Visuospatial/ Executive (0/5) 2  Naming (0/3) 3  Attention: Read list of digits (0/2) 2  Attention: Read list of letters (0/1) 1  Attention: Serial 7 subtraction starting at 100 (0/3) 3  Language: Repeat phrase (0/2) 1  Language : Fluency (0/1) 1  Abstraction (0/2) 1  Delayed Recall (0/5) 3  Orientation (0/6) 6  Total 23      09/05/2024   12:57 PM 06/03/2022    3:20 PM  6CIT Screen  What Year? 0 points 0 points  What month? 0 points 0 points  What time? 0 points 0 points  Count back from 20 0 points 0 points  Months in reverse 2 points 4 points  Repeat phrase 2 points 2 points  Total Score 4 points 6 points     Immunizations Immunization History  Administered Date(s) Administered   Fluad Quad(high Dose 65+) 10/16/2019, 09/13/2020, 10/07/2021, 10/13/2022   INFLUENZA, HIGH DOSE SEASONAL PF 09/12/2017, 10/20/2018, 08/07/2024   Influenza Split 09/13/2012   Influenza Whole 09/29/2006, 10/17/2007, 10/14/2008, 08/14/2009, 09/29/2010   Influenza,inj,Quad PF,6+ Mos 10/10/2014   PFIZER(Purple Top)SARS-COV-2 Vaccination 02/11/2020, 02/27/2020   Pneumococcal Conjugate-13 07/03/2015   Pneumococcal Polysaccharide-23 10/14/2008, 10/19/2017   Td 11/01/2009   Zoster Recombinant(Shingrix) 10/28/2021, 10/30/2021    Screening Tests Health Maintenance  Topic Date Due   DTaP/Tdap/Td (2 - Tdap) 11/02/2019   COVID-19 Vaccine (3 - Pfizer risk series) 03/26/2020   Zoster Vaccines- Shingrix (2 of 2) 12/25/2021   Medicare Annual Wellness (AWV)  09/05/2025   Mammogram  09/20/2025   Colonoscopy  06/14/2029   Pneumococcal Vaccine: 50+ Years  Completed   Influenza Vaccine  Completed   DEXA SCAN  Completed   Hepatitis C Screening  Completed   HPV VACCINES  Aged Out   Meningococcal B Vaccine  Aged Out    Health Maintenance Items Addressed: Information provided on vaccine recommendations   Additional Screening:  Vision Screening: Recommended annual ophthalmology exams for early detection of glaucoma and other disorders of the eye. Is the patient up to date with their annual eye exam?  Yes  Who is the provider or what is the name of the office in which the patient attends annual eye exams?   Dental Screening: Recommended annual dental exams for proper oral hygiene  Community Resource Referral / Chronic Care Management: CRR required this visit?  No   CCM required this visit?  No   Plan:    I have personally reviewed and noted the following in the patient's chart:   Medical and social history Use of alcohol, tobacco or illicit drugs  Current medications and supplements including opioid  prescriptions. Patient is not currently taking opioid prescriptions. Functional ability and status Nutritional status Physical activity Advanced directives List of other physicians Hospitalizations, surgeries, and ER visits in previous 12 months Vitals Screenings to include cognitive, depression, and falls Referrals and appointments  In addition, I have reviewed and discussed with patient certain preventive protocols, quality metrics, and best practice recommendations. A written personalized care plan for preventive services as well as general preventive health recommendations were provided to patient.   [name] [name], [place]   [id number]   After Visit Summary: (MyChart) Due to this being a telephonic visit, the after visit summary with patients personalized plan was offered to patient via MyChart   Notes: Nothing significant to report at  this time.

## 2024-09-05 NOTE — Patient Instructions (Signed)
 Ms. Radebaugh,  Thank you for taking the time for your Medicare Wellness Visit. I appreciate your continued commitment to your health goals. Please review the care plan we discussed, and feel free to reach out if I can assist you further.  Medicare recommends these wellness visits once per year to help you and your care team stay ahead of potential health issues. These visits are designed to focus on prevention, allowing your provider to concentrate on managing your acute and chronic conditions during your regular appointments.  Please note that Annual Wellness Visits do not include a physical exam. Some assessments may be limited, especially if the visit was conducted virtually. If needed, we may recommend a separate in-person follow-up with your provider.  Ongoing Care Seeing your primary care provider every 3 to 6 months helps us  monitor your health and provide consistent, personalized care.   Referrals If a referral was made during today's visit and you haven't received any updates within two weeks, please contact the referred provider directly to check on the status.  Recommended Screenings:  Health Maintenance  Topic Date Due   DTaP/Tdap/Td vaccine (2 - Tdap) 11/02/2019   COVID-19 Vaccine (3 - Pfizer risk series) 03/26/2020   Zoster (Shingles) Vaccine (2 of 2) 12/25/2021   Medicare Annual Wellness Visit  09/05/2025   Breast Cancer Screening  09/20/2025   Colon Cancer Screening  06/14/2029   Pneumococcal Vaccine for age over 68  Completed   Flu Shot  Completed   DEXA scan (bone density measurement)  Completed   Hepatitis C Screening  Completed   HPV Vaccine  Aged Out   Meningitis B Vaccine  Aged Out       09/05/2024   12:57 PM  Advanced Directives  Does Patient Have a Medical Advance Directive? No  Would patient like information on creating a medical advance directive? Yes (MAU/Ambulatory/Procedural Areas - Information given)   Advance Care Planning is important because  it: Ensures you receive medical care that aligns with your values, goals, and preferences. Provides guidance to your family and loved ones, reducing the emotional burden of decision-making during critical moments.  Information on Advanced Care Planning can be found at [place]  Secretary of [facility] Advance Health Care Directives Advance Health Care Directives ([email or web address]   Vision: Annual vision screenings are recommended for early detection of glaucoma, cataracts, and diabetic retinopathy. These exams can also reveal signs of chronic conditions such as diabetes and high blood pressure.  Dental: Annual dental screenings help detect early signs of oral cancer, gum disease, and other conditions linked to overall health, including heart disease and diabetes.  Please see the attached documents for additional preventive care recommendations.

## 2024-09-06 DIAGNOSIS — L403 Pustulosis palmaris et plantaris: Secondary | ICD-10-CM | POA: Diagnosis not present

## 2024-09-07 ENCOUNTER — Telehealth: Payer: Self-pay | Admitting: Orthopedic Surgery

## 2024-09-07 NOTE — Telephone Encounter (Signed)
 FYI: I spoke with the patient and she has decided against surgery for her trigger digits. Patient wants to try another injection instead. I scheduled her a follow up appointment to discuss an injection.

## 2024-09-10 ENCOUNTER — Other Ambulatory Visit

## 2024-09-14 ENCOUNTER — Other Ambulatory Visit: Admitting: *Deleted

## 2024-09-22 ENCOUNTER — Ambulatory Visit

## 2024-09-25 ENCOUNTER — Ambulatory Visit: Admitting: Orthopedic Surgery

## 2024-09-25 DIAGNOSIS — M65341 Trigger finger, right ring finger: Secondary | ICD-10-CM | POA: Diagnosis not present

## 2024-09-25 DIAGNOSIS — M65342 Trigger finger, left ring finger: Secondary | ICD-10-CM | POA: Diagnosis not present

## 2024-09-25 MED ORDER — LIDOCAINE HCL 1 % IJ SOLN
1.0000 mL | INTRAMUSCULAR | Status: AC | PRN
  Administered 2024-09-25: 1 mL

## 2024-09-25 MED ORDER — BETAMETHASONE SOD PHOS & ACET 6 (3-3) MG/ML IJ SUSP
6.0000 mg | INTRAMUSCULAR | Status: AC | PRN
  Administered 2024-09-25: 6 mg via INTRA_ARTICULAR

## 2024-09-25 NOTE — Progress Notes (Signed)
 [name] [name] - 73 y.o. female MRN [id number]  Date of birth: 1951/08/27  Office Visit Note: Visit Date: 09/25/2024 PCP: Jason Leita Repine, FNP (Inactive) Referred by: Jason Leita Repine,*  Subjective: No chief complaint on file.  HPI: [name] [name] is a pleasant 73 y.o. female who returns today for evaluation of bilateral ring finger trigger digit that is been present now for multiple months, worsening in nature.    Pertinent ROS were reviewed with the patient and found to be negative unless otherwise specified above in HPI.    Assessment & Plan: Visit Diagnoses:  1. Trigger finger, right ring finger   2. Trigger finger, left ring finger      Plan: Extensive discussion was had with the patient today regarding her bilateral ring finger trigger digit.  We discussed the etiology and pathophysiology of stenosing tenosynovitis.  We discussed conservative versus surgical treatment modalities.  From a conservative standpoint, we discussed activity modification, splinting, therapy and injections.  From a surgical standpoint, we discussed the possibility for trigger digit release as well as all risk and benefits associated.  She has undergone prior injections with symptoms refractory to conservative care, however considering her options she would like to retry injections to the bilateral ring finger trigger digit for symptom relief and forego surgery at this time.  Injections provided today without issue, she will return to me in approximately 6 weeks for recheck.   Follow-up: No follow-ups on file.   Meds & Orders: No orders of the defined types were placed in this encounter.   Orders Placed This Encounter  Procedures   Hand/UE Inj   Hand/UE Inj     Procedures: Hand/UE Inj: L ring A1 for trigger finger on 09/25/2024 7:58 PM Indications: pain Details: 25 G needle, volar approach Medications: 1 mL lidocaine  1 %; 6 mg betamethasone acetate-betamethasone sodium  phosphate 6 (3-3) MG/ML Outcome: tolerated well, no immediate complications Consent was given by the patient. Patient was prepped and draped in the usual sterile fashion.    Hand/UE Inj: R ring A1 for trigger finger on 09/25/2024 7:58 PM Indications: pain Details: 25 G needle, volar approach Medications: 1 mL lidocaine  1 %; 6 mg betamethasone acetate-betamethasone sodium phosphate  6 (3-3) MG/ML Outcome: tolerated well, no immediate complications Consent was given by the patient. Patient was prepped and draped in the usual sterile fashion.          Clinical History: No specialty comments available.  She reports that she quit smoking about 45 years ago. Her smoking use included cigarettes. She quit smokeless tobacco use about 21 months ago.  Her smokeless tobacco use included snuff.  Recent Labs    02/15/24 1458  HGBA1C 6.2    Objective:   Vital Signs: There were no vitals taken for this visit.  Physical Exam  Gen: Well-appearing, in no acute distress; non-toxic CV: Regular Rate. Well-perfused. Warm.  Resp: Breathing unlabored on room air; no wheezing. Psych: Fluid speech in conversation; appropriate affect; normal thought process  Ortho Exam Bilateral hand: - Palpable nodule at the A1 pulley of the bilateral ring finger, associated tenderness - Notable clicking with deep flexion of the bilateral ring finger, there is evidence of significant locking with deep flexion, right greater than left - Sensation intact distally, hand remains warm well-perfused   Imaging: No results found.  Past Medical/Family/Surgical/Social History: Medications & Allergies reviewed per EMR, new medications updated. Patient Active Problem List   Diagnosis Date Noted   Grief reaction  with prolonged bereavement 08/21/2024   MCI (mild cognitive impairment) 08/21/2024   Family history of dementia 08/21/2024   Complete tear of right rotator cuff 06/13/2023   Biceps tendonitis on right 06/13/2023    Synovitis of right shoulder 06/13/2023   Claudication in peripheral vascular disease 08/26/2020   Neurogenic claudication 07/10/2020   Degenerative lumbar spinal stenosis 05/16/2020   Sacroiliac pain 08/10/2019   Depression 05/11/2019   Chest pain 05/09/2019   Arthritis of sacroiliac joint of both sides 05/01/2019   Polyarthralgia 12/28/2018   Preventative health care 10/20/2018   RLS (restless legs syndrome) 09/27/2018   Insomnia disorder related to known organic factor 06/27/2018   PLMD (periodic limb movement disorder) 06/27/2018   Hot flashes due to menopause 04/25/2018   Palpitations 04/25/2018   Diaphoresis 04/25/2018   Long term current use of antithrombotics/antiplatelets 04/19/2018   Insomnia 04/19/2018   Intractable episodic headache 02/28/2018   Numbness and tingling of right arm 02/28/2018   Toe pain, right 10/19/2017   Rotator cuff arthropathy of left shoulder 06/02/2017   Trigger thumb of left hand 06/02/2017   Lipoma 05/06/2017   Pain of left thumb 05/06/2017   Chronic pain syndrome 12/15/2016   Lumbar degenerative disc disease 12/15/2016   Left leg pain 01/28/2016   Left shoulder pain 04/09/2015   Impaired glucose tolerance 07/30/2013   Abnormal breath sounds 07/30/2013   Asymptomatic bilateral carotid artery stenosis 05/05/2012   Anxiety 04/03/2011   Cerebral infarction (HCC) 03/15/2011   HLD (hyperlipidemia) 11/03/2010   Hx of adenomatous colonic polyps 02/09/2008   Hypothyroidism 11/06/2007   ALLERGIC RHINITIS 11/06/2007   OSTEOPOROSIS 11/06/2007   ALCOHOL ABUSE, HX OF 11/06/2007   Essential hypertension 09/13/2007   Asthma 09/13/2007   PERIMENOPAUSAL STATUS 09/13/2007   Chronic insomnia 09/13/2007   Past Medical History:  Diagnosis Date   Acid reflux    ALCOHOL ABUSE, HX OF 11/06/2007   ALLERGIC RHINITIS 11/06/2007   Allergy    Anxiety 04/03/2011   ASTHMA 09/13/2007   Asthma    Cataract    Chronic pain syndrome 12/15/2016   COLONIC  POLYPS, HX OF 02/09/2008    ADENOMATOUS POLYP   Coronary artery disease    Depression    Encounter for well adult exam without abnormal findings 04/03/2011   HYPERLIPIDEMIA 11/03/2010   HYPERTENSION 09/13/2007   HYPOTHYROIDISM 11/06/2007   Impaired glucose tolerance 07/30/2013   INSOMNIA, HX OF 09/13/2007   Lumbar degenerative disc disease 12/15/2016   OSTEOPOROSIS 11/06/2007   PAD (peripheral artery disease)    PERIMENOPAUSAL STATUS 09/13/2007   RLS (restless legs syndrome)    Stroke (HCC)    Substance abuse (HCC)    TIA (transient ischemic attack) 11/04/2011   Family History  Problem Relation Age of Onset   Hypertension Mother 39   Heart disease Mother    Heart failure Mother 22   Colon polyps Brother    Hypertension Brother    Colon cancer Brother 66   Dementia Maternal Grandmother    Breast cancer Cousin    Hyperlipidemia Other    Hypertension Other    Coronary artery disease Other    Esophageal cancer Neg Hx    Rectal cancer Neg Hx    Stomach cancer Neg Hx    Past Surgical History:  Procedure Laterality Date   ABDOMINAL AORTOGRAM W/LOWER EXTREMITY N/A 08/27/2020   Procedure: ABDOMINAL AORTOGRAM W/LOWER EXTREMITY;  Surgeon: Elmira Newman PARAS, MD;  Location: [facility];  Service: Cardiovascular;  Laterality: N/A;  BREAST EXCISIONAL BIOPSY Left    BREAST SURGERY  2008 and 2012   x 2 - benign, left side   carotid artery surgery Left    CESAREAN SECTION     x 3   COLONOSCOPY  multiple   2010   PERIPHERAL VASCULAR BALLOON ANGIOPLASTY  08/27/2020   Procedure: PERIPHERAL VASCULAR BALLOON ANGIOPLASTY;  Surgeon: Elmira Newman PARAS, MD;  Location: [facility];  Service: Cardiovascular;;  Right SFA scoring balloon   SHOULDER ARTHROSCOPY WITH SUBACROMIAL DECOMPRESSION, ROTATOR CUFF REPAIR AND BICEP TENDON REPAIR Right 06/08/2023   Procedure: RIGHT SHOULDER ARTHROSCOPY, DEBRIDEMENT, MINI OPEN ROTATOR CUFF TEAR REPAIR AND BICEPS TENODESIS;  Surgeon: Addie Cordella Hamilton, MD;  Location: [facility] OR;  Service: Orthopedics;  Laterality: Right;   TRANSFORAMINAL LUMBAR INTERBODY FUSION (TLIF) WITH PEDICLE SCREW FIXATION 1 LEVEL Left 07/10/2020   Procedure: LEFT-SIDED LUMBAR FOUR-FIVE TRANSFORAMINAL LUMBAR INTERBODY FUSION WITH INSTRUMENTATION AND ALLOGRAFT;  Surgeon: Beuford Anes, MD;  Location: [facility];  Service: Orthopedics;  Laterality: Left;   Social History   Occupational History   Occupation: Banker: [employer]  Tobacco Use   Smoking status: Former    Current packs/day: 0.00    Types: Cigarettes    Quit date: 05/31/1979    Years since quitting: 45.3   Smokeless tobacco: Former    Types: Snuff    Quit date: 2024   Tobacco comments:    social smoker  Vaping Use   Vaping status: Never Used  Substance and Sexual Activity   Alcohol use: Not Currently    Comment: 2004   Drug use: Not Currently    Types: Marijuana    Comment: mariuana years ago   Sexual activity: Not Currently    Birth control/protection: Post-menopausal    Clarine Elrod Estela) Arlinda, M.D. [facility], Hand Surgery

## 2024-09-28 ENCOUNTER — Ambulatory Visit
Admission: RE | Admit: 2024-09-28 | Discharge: 2024-09-28 | Disposition: A | Discharge status: Home / Self Care | Source: Ambulatory Visit | Attending: Family Medicine | Admitting: Family Medicine

## 2024-09-28 DIAGNOSIS — Z1231 Encounter for screening mammogram for malignant neoplasm of breast: Secondary | ICD-10-CM

## 2024-10-13 ENCOUNTER — Other Ambulatory Visit

## 2024-10-16 ENCOUNTER — Encounter: Payer: Self-pay | Admitting: Radiology

## 2024-10-30 ENCOUNTER — Other Ambulatory Visit (HOSPITAL_COMMUNITY): Payer: Self-pay

## 2024-10-30 ENCOUNTER — Other Ambulatory Visit: Payer: Self-pay

## 2024-10-30 ENCOUNTER — Telehealth: Payer: Self-pay | Admitting: Cardiology

## 2024-10-30 ENCOUNTER — Emergency Department (HOSPITAL_BASED_OUTPATIENT_CLINIC_OR_DEPARTMENT_OTHER)
Admission: EM | Admit: 2024-10-30 | Discharge: 2024-10-30 | Disposition: A | Discharge status: Home / Self Care | Source: Ambulatory Visit | Attending: Emergency Medicine | Admitting: Emergency Medicine

## 2024-10-30 ENCOUNTER — Emergency Department (HOSPITAL_BASED_OUTPATIENT_CLINIC_OR_DEPARTMENT_OTHER): Admitting: Radiology

## 2024-10-30 DIAGNOSIS — Z7902 Long term (current) use of antithrombotics/antiplatelets: Secondary | ICD-10-CM | POA: Diagnosis not present

## 2024-10-30 DIAGNOSIS — R0602 Shortness of breath: Secondary | ICD-10-CM | POA: Insufficient documentation

## 2024-10-30 DIAGNOSIS — Z7982 Long term (current) use of aspirin: Secondary | ICD-10-CM | POA: Insufficient documentation

## 2024-10-30 DIAGNOSIS — J45909 Unspecified asthma, uncomplicated: Secondary | ICD-10-CM | POA: Insufficient documentation

## 2024-10-30 DIAGNOSIS — Z79899 Other long term (current) drug therapy: Secondary | ICD-10-CM | POA: Insufficient documentation

## 2024-10-30 DIAGNOSIS — I10 Essential (primary) hypertension: Secondary | ICD-10-CM | POA: Diagnosis not present

## 2024-10-30 DIAGNOSIS — I5031 Acute diastolic (congestive) heart failure: Secondary | ICD-10-CM

## 2024-10-30 LAB — BASIC METABOLIC PANEL WITH GFR
Anion gap: 11 (ref 5–15)
BUN: 13 mg/dL (ref 8–23)
CO2: 27 mmol/L (ref 22–32)
Calcium: 9.8 mg/dL (ref 8.9–10.3)
Chloride: 100 mmol/L (ref 98–111)
Creatinine, Ser: 0.89 mg/dL (ref 0.44–1.00)
GFR, Estimated: >60 mL/min (ref >60)
Glucose, Bld: 133 mg/dL — ABNORMAL HIGH (ref 70–99)
Potassium: 3.6 mmol/L (ref 3.5–5.1)
Sodium: 139 mmol/L (ref 135–145)

## 2024-10-30 LAB — CBC
HCT: 36.5 % (ref 36.0–46.0)
Hemoglobin: 12.1 g/dL (ref 12.0–15.0)
MCH: 30.7 pg (ref 26.0–34.0)
MCHC: 33.2 g/dL (ref 30.0–36.0)
MCV: 92.6 fL (ref 80.0–100.0)
Platelets: 200 K/uL (ref 150–400)
RBC: 3.94 MIL/uL (ref 3.87–5.11)
RDW: 14.2 % (ref 11.5–15.5)
WBC: 7.2 K/uL (ref 4.0–10.5)
nRBC: 0 % (ref 0.0–0.2)

## 2024-10-30 LAB — TROPONIN T, HIGH SENSITIVITY
Troponin T High Sensitivity: <15 ng/L (ref 0–19)
Troponin T High Sensitivity: <15 ng/L (ref 0–19)

## 2024-10-30 LAB — PRO BRAIN NATRIURETIC PEPTIDE: Pro Brain Natriuretic Peptide: 368 pg/mL — ABNORMAL HIGH (ref <300.0)

## 2024-10-30 MED ORDER — FUROSEMIDE 40 MG PO TABS
40.0000 mg | ORAL_TABLET | Freq: Every day | ORAL | 0 refills | Status: DC
  Filled 2024-10-30: qty 15, 15d supply, fill #0

## 2024-10-30 MED ORDER — FUROSEMIDE 40 MG PO TABS
40.0000 mg | ORAL_TABLET | Freq: Once | ORAL | Status: AC
  Administered 2024-10-30: 40 mg via ORAL
  Filled 2024-10-30: qty 1

## 2024-10-30 MED ORDER — POTASSIUM CHLORIDE CRYS ER 10 MEQ PO TBCR
20.0000 meq | EXTENDED_RELEASE_TABLET | Freq: Every day | ORAL | 0 refills | Status: DC
  Filled 2024-10-30: qty 30, 15d supply, fill #0

## 2024-10-30 NOTE — Telephone Encounter (Signed)
 ICD-10-CM   1. Acute diastolic heart failure (HCC)  I50.31 furosemide (LASIX) 40 MG tablet    potassium chloride  (KLOR-CON  M) 10 MEQ tablet    Basic metabolic panel with GFR    Brain natriuretic peptide     Orders Placed This Encounter  Procedures   Basic metabolic panel with GFR   Brain natriuretic peptide    Meds ordered this encounter  Medications   furosemide (LASIX) 40 MG tablet    Sig: Take 1 tablet (40 mg total) by mouth daily.    Dispense:  15 tablet    Refill:  0   potassium chloride  (KLOR-CON  M) 10 MEQ tablet    Sig: Take 2 tablets (20 mEq total) by mouth daily.    Dispense:  30 tablet    Refill:  0

## 2024-10-30 NOTE — ED Triage Notes (Addendum)
 SOB several days at rest- worse with exertion. Some swelling in legs- has actually improved. Lungs clear, diminished.   Called Dr. Ladona today- he sent lasix Rx and ordered BNP. Sent her to ER. Pt has not started lasix and labs were not drawn.

## 2024-10-30 NOTE — Telephone Encounter (Signed)
 Pt c/o swelling/edema: STAT if pt has developed SOB within 24 hours   If swelling, where is the swelling located? Feet and ankles    How much weight have you gained and in what time span? Not sure    Have you gained 2 pounds in a day or 5 pounds in a week? Not sure    Do you have a log of your daily weights (if so, list)? No    Are you currently taking a fluid pill? Yes    Are you currently SOB? Yes    Have you traveled recently in a car or plane for an extended period of time? Pt has been having swelling since Saturday. Pt states swelling was more severe over the weekend. Please advise.    Call transferred to triage.    I spoke with the patient and she states that she is currently experiencing shortness of breath and light headedness. She is also having lower leg edema and this has been going on since Saturday. She is not on a diuretic and I advised for her to go to the emergency room to be treated. I will let Dr. Ladona know of this as well.

## 2024-10-30 NOTE — ED Provider Notes (Signed)
 [place] EMERGENCY DEPARTMENT AT [facility] Provider Note   CSN: [id number] Arrival date & time: 10/30/24  8251     Patient presents with: Shortness of Breath   [name] [name] is a 73 y.o. female past medical history significant for asthma, hypertension, bilateral carotid artery stenosis presents today for shortness of breath x several days.  Patient reports it is present at rest but worse with exertion and when reclining.  Patient does report some swelling in bilateral lower extremities.  Patient states it feels different from her asthma shortness of breath.    Shortness of Breath      Prior to Admission medications   Medication Sig Start Date End Date Taking? Authorizing Provider  acitretin (SORIATANE) 25 MG capsule Take 25 mg by mouth daily. 01/20/22   [provider]  Aspirin -Acetaminophen -Caffeine (GOODY HEADACHE PO) Take by mouth.    [provider]  Calcium  Carbonate-Vit D-Min (CALCIUM  1200 PO) Take 1,200 mg by mouth daily.    [provider]  Cholecalciferol  (VITAMIN D ) 50 MCG (2000 UT) tablet Take 2,000 Units by mouth daily.    [provider]  citalopram  (CELEXA ) 20 MG tablet TAKE 1 TABLET BY MOUTH EVERY DAY 08/17/24   Jason Leita Repine, FNP  clopidogrel  (PLAVIX ) 75 MG tablet Take 1 tablet (75 mg total) by mouth daily. 08/17/24   Ladona Heinz, MD  diltiazem  (CARDIZEM  CD) 180 MG 24 hr capsule Take 1 capsule (180 mg total) by mouth daily. 05/01/24   Ladona Heinz, MD  ezetimibe  (ZETIA ) 10 MG tablet Take 1 tablet (10 mg total) by mouth daily. 06/06/24   Ladona Heinz, MD  furosemide (LASIX) 40 MG tablet Take 1 tablet (40 mg total) by mouth daily. 10/30/24   Ladona Heinz, MD  HYDROcodone  bit-homatropine (HYCODAN) 5-1.5 MG/5ML syrup Take 5 mLs by mouth every 8 (eight) hours as needed for cough. 08/22/24   Jason Leita Repine, FNP  levothyroxine  (SYNTHROID ) 88 MCG tablet TAKE 1 TABLET BY MOUTH EVERY DAY BEFORE BREAKFAST 02/16/24   Jason Leita Repine, FNP  losartan  (COZAAR ) 25 MG tablet Take 1 tablet (25 mg total) by mouth every evening. 05/01/24   Ladona Heinz, MD  methocarbamol  (ROBAXIN ) 500 MG tablet TAKE 1 TABLET BY MOUTH EVERY 12 HOURS AS NEEDED 08/17/24   Addie Cordella Hamilton, MD  montelukast  (SINGULAIR ) 10 MG tablet Take 1 tablet (10 mg total) by mouth daily. 08/24/24   Jason Leita Repine, FNP  Multiple Vitamin (MULTIVITAMIN WITH MINERALS) TABS tablet Take 1 tablet by mouth daily.    [provider]  Omega-3 Fatty Acids (FISH OIL) 1000 MG CAPS Take 1,000 mg by mouth daily.    [provider]  potassium chloride  (KLOR-CON  M) 10 MEQ tablet Take 2 tablets (20 mEq total) by mouth daily. 10/30/24   Ladona Heinz, MD  predniSONE  (DELTASONE ) 20 MG tablet Take 1 tablet (20 mg total) by mouth daily with breakfast. Patient not taking: Reported on 09/05/2024 08/22/24   Jason Leita Repine, FNP  rOPINIRole  (REQUIP ) 0.5 MG tablet Take 1 tablet (0.5 mg total) by mouth at bedtime. 08/24/24   Jason Leita Repine, FNP  rosuvastatin  (CRESTOR ) 40 MG tablet Take 1 tablet (40 mg total) by mouth daily. 04/28/24   Ladona Heinz, MD  zolpidem  (AMBIEN ) 10 MG tablet TAKE 1 TABLET BY MOUTH AT BEDTIME AS NEEDED FOR SLEEP. 08/17/24   Jason Leita Repine, FNP    Allergies: Atorvastatin , Benazepril , Lisinopril , Fosamax [alendronate sodium], Penicillins, and Trazodone  and nefazodone    Review  of Systems  Respiratory:  Positive for shortness of breath.   Cardiovascular:  Positive for leg swelling.    Updated Vital Signs BP (!) 145/73 (BP Location: Right Arm)   Pulse 63   Temp 98.1 F (36.7 C)   Resp 18   SpO2 97%   Physical Exam Vitals and nursing note reviewed.  Constitutional:      General: She is not in acute distress.    Appearance: She is well-developed. She is not toxic-appearing.  HENT:     Head: Normocephalic and atraumatic.     Mouth/Throat:     Mouth: Mucous membranes are moist.  Eyes:     Conjunctiva/sclera:  Conjunctivae normal.  Cardiovascular:     Rate and Rhythm: Normal rate and regular rhythm.     Pulses: Normal pulses.     Heart sounds: Normal heart sounds. No murmur heard. Pulmonary:     Effort: Pulmonary effort is normal. No respiratory distress.     Breath sounds: Normal breath sounds. No wheezing, rhonchi or rales.  Chest:     Chest wall: No tenderness.  Abdominal:     Palpations: Abdomen is soft.     Tenderness: There is no abdominal tenderness.  Musculoskeletal:        General: No swelling.     Cervical back: Neck supple.     Right lower leg: No edema.     Left lower leg: No edema.  Skin:    General: Skin is warm and dry.     Capillary Refill: Capillary refill takes less than 2 seconds.  Neurological:     General: No focal deficit present.     Mental Status: She is alert and oriented to person, place, and time.  Psychiatric:        Mood and Affect: Mood normal.     (all labs ordered are listed, but only abnormal results are displayed) Labs Reviewed  BASIC METABOLIC PANEL WITH GFR - Abnormal; Notable for the following components:      Result Value   Glucose, Bld 133 (*)    All other components within normal limits  PRO BRAIN NATRIURETIC PEPTIDE - Abnormal; Notable for the following components:   Pro Brain Natriuretic Peptide 368.0 (*)    All other components within normal limits  CBC  TROPONIN T, HIGH SENSITIVITY  TROPONIN T, HIGH SENSITIVITY    EKG: EKG Interpretation Date/Time:  Monday October 30 2024 18:05:04 EST Ventricular Rate:  73 PR Interval:  126 QRS Duration:  88 QT Interval:  386 QTC Calculation: 425 R Axis:   75  Text Interpretation: Normal sinus rhythm Nonspecific ST and T wave abnormality Baseline wander Confirmed by Bernard Drivers ([id number]) on 10/30/2024 6:14:50 PM  Radiology: [facility] Chest 2 View Result Date: 10/30/2024 CLINICAL DATA:  Chest pain and shortness of breath EXAM: DG CHEST 2V COMPARISON:  Chest x-ray 12/09/2021 FINDINGS: The heart  size and mediastinal contours are within normal limits. Both lungs are clear. The visualized skeletal structures are unremarkable. IMPRESSION: No active cardiopulmonary disease. Electronically Signed   By: Greig Pique M.D.   On: 10/30/2024 19:38     Procedures   Medications Ordered in the ED  furosemide (LASIX) tablet 40 mg (40 mg Oral Given 10/30/24 2302)                                    Medical Decision Making Amount and/or Complexity of Data  Reviewed Labs: ordered. Radiology: ordered.   This patient presents to the ED for concern of shortness of breath, this involves an extensive number of treatment options, and is a complaint that carries with it a high risk of complications and morbidity.  The differential diagnosis includes fluid overload, wheezing, STEMI, NSTEMI, arrhythmia, electrolyte abnormality   Additional history obtained:  Additional history obtained from EMR External records from outside source obtained and reviewed including cardiology notes   Lab Tests:  I Ordered, and personally interpreted labs.  The pertinent results include: proBNP 368, delta troponin less than 15, BMP unremarkable, CBC unremarkable   Imaging Studies ordered:  I ordered imaging studies including chest x-ray I independently visualized and interpreted imaging which showed no active cardiopulmonary disease I agree with the radiologist interpretation   Cardiac Monitoring: / EKG:  The patient was maintained on a cardiac monitor.  I personally viewed and interpreted the cardiac monitored which showed an underlying rhythm of: Normal sinus rhythm   Problem List / ED Course / Critical interventions / Medication management I ordered medication including Lasix I have reviewed the patients home medicines and have made adjustments as needed   Test / Admission - Considered:  Patient able to ambulate on pulse ox and maintain SpO2 of 96% or greater on room air. Considered for admission or  further workup however patient's vital signs, physical exam, labs, and imaging are reassuring.  Patient's symptoms likely due to mild fluid overload secondary to CHF.  Patient given dose of Lasix while in ED.  Patient's cardiologist, Dr. Ladona prescribed patient potassium and Lasix earlier today and patient will pick up this medication tomorrow.  Patient to follow-up with cardiology in the upcoming week.  Patient given return precautions.  I feel patient safe for discharge at this time.     Final diagnoses:  Shortness of breath    ED Discharge Orders     None          Francis Ileana LOISE DEVONNA 10/30/24 2304    Bernard Drivers, MD 10/30/24 2320

## 2024-10-30 NOTE — Discharge Instructions (Signed)
 Today you were seen for shortness of breath.  I suspect this is likely due to fluid overload.  Please pick up your Lasix and potassium and take as directed.  Please follow-up with Dr. Ladona with cardiology as soon as possible for further evaluation and workup.  Please return to the ED if your shortness of breath worsens.  Thank you for letting us  treat you today. After reviewing your labs and imaging, I feel you are safe to go home. Please follow up with your PCP in the next several days and provide them with your records from this visit. Return to the Emergency Room if pain becomes severe or symptoms worsen.

## 2024-10-30 NOTE — Telephone Encounter (Signed)
 I called and spoke with the patient and her son. They were on the way to the emergency room. I still advised for them to be seen in the ER and explained Dr. Godfrey orders he just put in. They do want the instructions to be typed up and sent through mychart for review. I let them know about the Lasix and Potassium medication Dr. Ladona ordered and the instructions on them. As well as the blood work he would like the patient to get done before her next appointment.

## 2024-10-30 NOTE — Telephone Encounter (Signed)
 Pt c/o swelling/edema: STAT if pt has developed SOB within 24 hours  If swelling, where is the swelling located? Feet and ankles   How much weight have you gained and in what time span? Not sure   Have you gained 2 pounds in a day or 5 pounds in a week? Not sure   Do you have a log of your daily weights (if so, list)? No   Are you currently taking a fluid pill? Yes   Are you currently SOB? Yes   Have you traveled recently in a car or plane for an extended period of time? Pt has been having swelling since Saturday. Pt states swelling was more severe over the weekend. Please advise.   Call transferred to triage.

## 2024-10-30 NOTE — ED Notes (Signed)
 Patient ambulated to restroom and down hall independently, with some expression of SOB when returning to room. Initial HR 77/Sats 98% RA prior to ambulation. On return to room HR 78/ Sats 96% on RA.

## 2024-10-30 NOTE — ED Notes (Signed)
 Pt given water

## 2024-10-31 ENCOUNTER — Other Ambulatory Visit (HOSPITAL_COMMUNITY): Payer: Self-pay

## 2024-10-31 NOTE — Telephone Encounter (Signed)
 Sure, I am good with this

## 2024-10-31 NOTE — Telephone Encounter (Signed)
 I called the patient and she wanted to let us  know that she did go to the ED yesterday and is back home. She had concerns about her lightheadedness that she forgot to mention to us  when she called yesterday and she did not mention this when she was at the hospital either. She is having lightheadedness every morning when she gets up and sometimes gets dizzy. This can happen throughout the day as well but mostly is in the mornings. She does not take daily blood pressure readings and wants to see Dr. Ladona about it. I suggested that she start taking daily blood pressure readings, stay hydrated, continue taking her medications and I will see when we can get an appointment for her.

## 2024-10-31 NOTE — Telephone Encounter (Signed)
 Pt daughter in law calling back asking to speak Tayyiba. Her daughter in law is concerned about her taking potassium while she is on losartan . Daughter in law also states pt has been experiencing dizziness, fatigue and headaches. Please advise.

## 2024-10-31 NOTE — Telephone Encounter (Signed)
Spoke with the patient and scheduled her for an appointment.  

## 2024-11-05 NOTE — Progress Notes (Deleted)
 Trigger finger follow up; bilateral ring fingers- injection 09/25/24

## 2024-11-06 ENCOUNTER — Ambulatory Visit: Admitting: Orthopedic Surgery

## 2024-11-16 ENCOUNTER — Encounter: Payer: Self-pay | Admitting: Cardiology

## 2024-11-16 ENCOUNTER — Ambulatory Visit: Attending: Cardiology | Admitting: Cardiology

## 2024-11-16 ENCOUNTER — Other Ambulatory Visit: Payer: Self-pay | Admitting: Family Medicine

## 2024-11-16 ENCOUNTER — Ambulatory Visit: Payer: Self-pay | Admitting: Cardiology

## 2024-11-16 ENCOUNTER — Other Ambulatory Visit (HOSPITAL_COMMUNITY): Payer: Self-pay

## 2024-11-16 VITALS — BP 122/64 | HR 79 | Resp 16 | Ht 59.0 in | Wt 165.4 lb

## 2024-11-16 DIAGNOSIS — E7841 Elevated Lipoprotein(a): Secondary | ICD-10-CM

## 2024-11-16 DIAGNOSIS — I6523 Occlusion and stenosis of bilateral carotid arteries: Secondary | ICD-10-CM

## 2024-11-16 DIAGNOSIS — I10 Essential (primary) hypertension: Secondary | ICD-10-CM

## 2024-11-16 DIAGNOSIS — I5032 Chronic diastolic (congestive) heart failure: Secondary | ICD-10-CM

## 2024-11-16 DIAGNOSIS — E7800 Pure hypercholesterolemia, unspecified: Secondary | ICD-10-CM

## 2024-11-16 DIAGNOSIS — Z9889 Other specified postprocedural states: Secondary | ICD-10-CM | POA: Diagnosis not present

## 2024-11-16 MED ORDER — FUROSEMIDE 40 MG PO TABS
40.0000 mg | ORAL_TABLET | Freq: Every day | ORAL | 1 refills | Status: AC | PRN
  Filled 2024-11-16 – 2024-11-30 (×2): qty 30, 30d supply, fill #0

## 2024-11-16 MED ORDER — ZOLPIDEM TARTRATE 10 MG PO TABS: 10.0000 mg | ORAL_TABLET | Freq: Every evening | ORAL | 0 refills | Status: AC | PRN

## 2024-11-16 MED ORDER — POTASSIUM CHLORIDE CRYS ER 10 MEQ PO TBCR
20.0000 meq | EXTENDED_RELEASE_TABLET | Freq: Every day | ORAL | 1 refills | Status: AC | PRN
  Filled 2024-11-16 – 2024-11-30 (×2): qty 60, 30d supply, fill #0

## 2024-11-16 NOTE — Telephone Encounter (Signed)
 Copied from CRM #[id number]. Topic: Clinical - Medication Refill >> Nov 16, 2024  2:40 PM Wess RAMAN wrote: Medication: zolpidem  (AMBIEN ) 10 MG tablet   Has the patient contacted their pharmacy? Yes (Agent: If no, request that the patient contact the pharmacy for the refill. If patient does not wish to contact the pharmacy document the reason why and proceed with request.) (Agent: If yes, when and what did the pharmacy advise?) Pharmacy has not received anything  This is the patient's preferred pharmacy:  [id number] - [place], [place] - [address] AT [facility] [address] [place] [address] Phone: [phone] Fax: [phone]  Is this the correct pharmacy for this prescription? Yes If no, delete pharmacy and type the correct one.   Has the prescription been filled recently? Yes  Is the patient out of the medication? Yes  Has the patient been seen for an appointment in the last year OR does the patient have an upcoming appointment? Yes  Can we respond through MyChart? Yes  Agent: Please be advised that Rx refills may take up to 3 business days. We ask that you follow-up with your pharmacy.

## 2024-11-16 NOTE — Progress Notes (Signed)
 Normal kidney function and normal potassium, continue present meds

## 2024-11-16 NOTE — Progress Notes (Signed)
 Cardiology Office Note:  .   Date:  11/18/2024  ID:  Sabrina NOVAK Albright, DOB 1951/01/10, MRN [id number] PCP: Jason Leita Repine, FNP (Inactive)  [facility] Providers Cardiologist:  Gordy Bergamo, MD   History of Present Illness: .   Laurieanne Galloway Olazabal is a 73 y.o. Azalya B Lembke is a 73 y.o. AA female  with history of left carotid endarterectomy due to symptomatic carotid stenosis in 2012 with TIA, asymptomatic right carotid stenosis, PAD with history of right SFA angioplasty, bronchial asthma, hypertension, hyperlipidemia, hyperglycemia, she is a prior smoker then switched to chewing tobacco but she has quit chewing tobacco since January 2025.   She had called for worsening leg edema and dyspnea and called in for furosemide , she also went to the emergency room on 10/30/2024, proBNP elevated at 368, negative troponins, chest x-ray did not reveal any significant pulmonary edema and she received IV Lasix  and discharged home.  She now presents for follow-up.  States that she is back to her baseline, she is not being very careful with her diet, has started to lose weight.  Her daughter was present.    Discussed the use of AI scribe software for clinical note transcription with the patient, who gave verbal consent to proceed.  History of Present Illness Verma B Demir is a 73 year old female with acute diastolic heart failure who presents with shortness of breath and leg swelling. She is accompanied by her daughter-in-law.  She has persistent shortness of breath and leg swelling for which Lasix  was prescribed. The leg swelling has improved, but she has not noticed a clear increase in urination. She has two fluid pills and two potassium pills left and takes furosemide  and potassium only when leg swelling or shortness of breath worsens.  She has acute diastolic heart failure, high blood pressure, high cholesterol, peripheral arterial disease, and prior tobacco use. She has quit smoking and vaping.  Her current cardiovascular medications are Plavix , losartan , diltiazem  CD 180 mg daily, rosuvastatin  40 mg daily, Zetia  10 mg daily, and furosemide  with potassium as needed for fluid retention.  She has had significant weight gain since quitting smoking and currently weighs 165 pounds. She drinks about four regular Pepsis a day and previously ate large amounts of sugary snacks at night.  A recent carotid duplex showed mild to moderate right-sided disease, unchanged from last year, and a normal left side. She is due for a cholesterol recheck and recently had kidney function tested in the setting of Lasix  use.  Cardiac Studies relevent.    Carotid artery duplex 04/27/2024:  Right ICA 40 to 59% stenosis with diffuse heterogenous plaque.  Left ICA 1-39% stenosis, tortuous segments.  CCA <50% stenosis with mild plaque.  Bilateral antegrade vertebral artery flow.  Normal flow hemodynamics in bilateral subclavian arteries.   Lexiscan  Nuclear stress test 01/06/2021:  Myocardial perfusion is normal. TID is mildly abnormal at 1.25.  Overall LV systolic function is normal without regional wall motion abnormalities. Stress LV EF: 69%.   Echocardiogram 01/05/2022: Left ventricle cavity is normal in size and wall thickness. Normal global wall motion. Normal LV systolic function with EF 55%. Normal diastolic filling pattern. Calculated EF 55%.  Labs   Lab Results  Component Value Date   CHOL 188 11/16/2024   HDL 89 11/16/2024   LDLCALC 86 11/16/2024   LDLDIRECT 72 10/17/2019   TRIG 69 11/16/2024   CHOLHDL 2.1 04/26/2024    Lab Results  Component Value Date   CHOL  188 11/16/2024   HDL 89 11/16/2024   LDLCALC 86 11/16/2024   LDLDIRECT 72 10/17/2019   TRIG 69 11/16/2024   CHOLHDL 2.1 04/26/2024   Lipoprotein (a)  Date/Time Value Ref Range Status  11/16/2024 12:46 PM 567.0 (H) <75.0 nmol/L Final    Comment:    **Results verified by repeat testing** Note:  Values greater than or equal to 75.0  nmol/L may        indicate an independent risk factor for CHD,        but must be evaluated with caution when applied        to non-Caucasian populations due to the        influence of genetic factors on Lp(a) across        ethnicities.     Recent Labs    04/19/24 1311 10/30/24 1759 11/15/24 1458  NA 139 139 143  K 3.9 3.6 3.9  CL 101 100 98  CO2 30 27 26   GLUCOSE 91 133* 87  BUN 15 13 11   CREATININE 0.98 0.89 0.97  CALCIUM  9.2 9.8 9.9  GFRNONAA  --  >60  --     Lab Results  Component Value Date   ALT 16 04/19/2024   AST 20 04/19/2024   ALKPHOS 97 04/19/2024   BILITOT 0.5 04/19/2024      Latest Ref Rng & Units 10/30/2024    5:59 PM 04/19/2024    1:11 PM 02/15/2024    2:58 PM  CBC  WBC 4.0 - 10.5 K/uL 7.2  6.3  6.5   Hemoglobin 12.0 - 15.0 g/dL 87.8  87.3  87.6   Hematocrit 36.0 - 46.0 % 36.5  37.9  37.1   Platelets 150 - 400 K/uL 200  213.0  205.0    Lab Results  Component Value Date   HGBA1C 6.2 02/15/2024    Lab Results  Component Value Date   TSH 2.69 04/19/2024     ROS  Review of Systems  Cardiovascular:  Negative for chest pain, dyspnea on exertion and leg swelling.   Physical Exam:   VS:  BP 122/64 (BP Location: Left Arm, Patient Position: Sitting, Cuff Size: Normal)   Pulse 79   Resp 16   Ht 4' 11 (1.499 m)   Wt 165 lb 6.4 oz (75 kg)   SpO2 96%   BMI 33.41 kg/m    Wt Readings from Last 3 Encounters:  11/16/24 165 lb 6.4 oz (75 kg)  09/05/24 156 lb (70.8 kg)  08/21/24 156 lb (70.8 kg)    BP Readings from Last 3 Encounters:  11/16/24 122/64  10/30/24 (!) 145/73  08/21/24 133/68   Physical Exam Neck:     Vascular: No JVD.  Cardiovascular:     Rate and Rhythm: Normal rate and regular rhythm.     Pulses:          Carotid pulses are  on the right side with bruit and  on the left side with bruit.      Dorsalis pedis pulses are 1+ on the right side and 0 on the left side.       Posterior tibial pulses are 1+ on the right side and 0 on the  left side.     Heart sounds: Normal heart sounds. No murmur heard.    No gallop.  Pulmonary:     Effort: Pulmonary effort is normal.     Breath sounds: Normal breath sounds.  Abdominal:     General: Bowel  sounds are normal.     Palpations: Abdomen is soft.  Musculoskeletal:     Right lower leg: No edema.     Left lower leg: No edema.    EKG:         ASSESSMENT AND PLAN: .      ICD-10-CM   1. Chronic diastolic heart failure (HCC)  P49.67 furosemide  (LASIX ) 40 MG tablet    potassium chloride  (KLOR-CON  M) 10 MEQ tablet    2. Primary hypertension  I10     3. Asymptomatic bilateral carotid artery stenosis  I65.23 Lipid Panel With LDL/HDL Ratio    Lipoprotein A (LPA)    4. History of left-sided carotid endarterectomy  Z98.890     5. Hypercholesteremia  E78.00 Lipid Panel With LDL/HDL Ratio    Lipoprotein A (LPA)     Assessment & Plan Chronic diastolic heart failure Recent episode of acute diastolic heart failure with symptoms of shortness of breath and leg swelling. Symptoms have improved with Lasix . Heart failure is currently resolved. Discussed the role of high blood pressure, high cholesterol, and smoking in contributing to heart stiffness. Emphasized the importance of dietary salt intake and exercise in managing heart failure. - Prescribed Lasix  and potassium to be taken only if leg swelling and shortness of breath recur. - Advised on dietary salt intake and exercise to prevent fluid retention. - Provided educational material on heart failure diet. - She has frequency of urination due to her age, I did not want to use SGLT2 inhibitors.  Primary hypertension Blood pressure is well controlled with current medication regimen. Discussed the role of hypertension in contributing to heart failure. - Continue current antihypertensive regimen.  Hypercholesterolemia Cholesterol levels need re-evaluation. Currently on rosuvastatin  and Zetia . Discussed the importance of monitoring  cholesterol levels to manage cardiovascular risk. - Ordered cholesterol test today. - Ordered LPA test. - Lp(a) is pending and unless elevated, we will continue present rosuvastatin  40 mg and Zetia  10 mg daily.  Reviewed the lipid profile, her direct LDL is much lower than calculated LDL at around 70.  Hence continue present medical management unless Lp(a) is abnormal she will need Repatha  Bilateral carotid artery stenosis Mild to moderate stenosis on the right side, unchanged from last year. Left carotid endarterectomy is patent. No current symptoms related to carotid artery disease. - Will monitor carotid artery stenosis with repeat imaging in one year.   Follow up: 1 year or sooner if problems.  (Addendum: Moderately elevated Lp(a), will refer to clinical pharmacist for initiation of Repatha or Leqvio).  Can discontinue Zetia  and continue high intensity statin.  Signed,  Gordy Bergamo, MD, [facility] 11/18/2024, 8:00 AM [facility] [address] [place], [place] [address] Phone: [phone]. Fax:  [phone]

## 2024-11-16 NOTE — Patient Instructions (Addendum)
 Medication Instructions:  Your physician recommends that you continue on your current medications as directed. Please refer to the Current Medication list given to you today.  *If you need a refill on your cardiac medications before your next appointment, please call your pharmacy*  Lab Work: Please complete your lipid labs in our first floor lab before you leave.  If you have labs (blood work) drawn today and your tests are completely normal, you will receive your results only by: MyChart Message (if you have MyChart) OR A paper copy in the mail If you have any lab test that is abnormal or we need to change your treatment, we will call you to review the results.  Testing/Procedures: None.  Follow-Up: At [facility], you and your health needs are our priority.  As part of our continuing mission to provide you with exceptional heart care, our providers are all part of one team.  This team includes your primary Cardiologist (physician) and Advanced Practice Providers or APPs (Physician Assistants and Nurse Practitioners) who all work together to provide you with the care you need, when you need it.  Your next appointment:   1 yr (after you have completed your carotid artery scan)  Provider:   Gordy Bergamo, MD

## 2024-11-17 LAB — BASIC METABOLIC PANEL WITH GFR
BUN/Creatinine Ratio: 11 — ABNORMAL LOW (ref 12–28)
BUN: 11 mg/dL (ref 8–27)
CO2: 26 mmol/L (ref 20–29)
Calcium: 9.9 mg/dL (ref 8.7–10.3)
Chloride: 98 mmol/L (ref 96–106)
Creatinine, Ser: 0.97 mg/dL (ref 0.57–1.00)
Glucose: 87 mg/dL (ref 70–99)
Potassium: 3.9 mmol/L (ref 3.5–5.2)
Sodium: 143 mmol/L (ref 134–144)
eGFR: 62 mL/min/1.73 (ref >59)

## 2024-11-17 LAB — BRAIN NATRIURETIC PEPTIDE: BNP: 59.7 pg/mL (ref 0.0–100.0)

## 2024-11-18 LAB — LIPOPROTEIN A (LPA): Lipoprotein (a): 567 nmol/L — AB (ref <75.0)

## 2024-11-18 LAB — LIPID PANEL WITH LDL/HDL RATIO
Cholesterol, Total: 188 mg/dL (ref 100–199)
HDL: 89 mg/dL (ref >39)
LDL Chol Calc (NIH): 86 mg/dL (ref 0–99)
LDL/HDL Ratio: 1 ratio (ref 0.0–3.2)
Triglycerides: 69 mg/dL (ref 0–149)
VLDL Cholesterol Cal: 13 mg/dL (ref 5–40)

## 2024-11-18 NOTE — Addendum Note (Signed)
 Addended by: LADONA MILAN on: 11/18/2024 08:03 AM   Modules accepted: Orders

## 2024-11-28 ENCOUNTER — Other Ambulatory Visit (HOSPITAL_COMMUNITY): Payer: Self-pay

## 2024-11-30 ENCOUNTER — Other Ambulatory Visit (HOSPITAL_COMMUNITY): Payer: Self-pay

## 2024-12-01 ENCOUNTER — Ambulatory Visit: Payer: Self-pay

## 2024-12-01 NOTE — Telephone Encounter (Signed)
" °  FYI Only or Action Required?: FYI only for provider: Referred to UC.  Patient was last seen in primary care on 08/22/2024 by Jason Leita Repine, FNP.  Called Nurse Triage reporting Cough.  Symptoms began 5 days ago.  Interventions attempted: OTC medications: Throat spray, wixela.  Symptoms are: gradually worsening.  Triage Disposition: See Physician Within 24 Hours  Patient/caregiver understands and will follow disposition?:     Summary: Dark Mucus, Cough, Congestion   Reason for Triage: Pt stated she has a sickness that she get yearly around this time. Current sx are congestion, dark colored mucus, and hurts in her chest to cough. Pt does not want an appt and only wants an abx and yeast infection pill for after the abx. Pt stated that her former PCP told her if she ever needed anything she can call the receptionist and they will take care of her. I advised that given her sx and with her former PCP being gone, she would need to be evaluated before anything can be ordered. Pt declined because she will be having a TOC appt next month and this is a normal thing for her to just have something sent in. Please call back [phone]     Reason for Disposition  [1] Has underlying lung disease (e.g., COPD, chronic bronchitis or emphysema) AND [2] sputum has turned yellow or green in color  Answer Assessment - Initial Assessment Questions 1. ONSET: When did the cough begin?      monday 2. SEVERITY: How bad is the cough today? Did the blood appear after a coughing spell?      Intermittent, streaks of blood in sputum today 3. SPUTUM: Describe the color of your sputum (e.g., none, dry cough; clear, white, yellow, green)     Brown at times with streaks of blood 4. HEMOPTYSIS: How much blood? (e.g., flecks, streaks, tablespoons)     streaks 5. DIFFICULTY BREATHING: Are you having difficulty breathing? If Yes, ask: How bad is it? (e.g., mild, moderate, severe)      denies 6.  FEVER: Do you have a fever? If Yes, ask: What is your temperature, how was it measured, and when did it start?     no 7. CARDIAC HISTORY: Do you have any history of heart disease? (e.g., heart attack, congestive heart failure)       8. LUNG HISTORY: Do you have any history of lung disease?  (e.g., pulmonary embolus, asthma, emphysema)     asthma 9. PE RISK FACTORS: Do you have a history of blood clots? Note: Other risk factors include recent major surgery, recent prolonged travel, being bedridden.     no 10. OTHER SYMPTOMS: Do you have any other symptoms? (e.g., runny nose, wheezing, chest pain)       Chest pain with cough  Protocols used: Coughing Up Blood-A-AH  "

## 2025-01-02 ENCOUNTER — Telehealth: Payer: Self-pay | Admitting: Neurology

## 2025-01-02 NOTE — Telephone Encounter (Signed)
 Patient calling to schedule cancelled EEG and MRI.  Informed her would need to call GI to see if can still schedule. Was not able to reschedule EEG. Patient would like get both done.

## 2025-01-04 ENCOUNTER — Encounter: Payer: Self-pay | Admitting: Family Medicine

## 2025-01-04 ENCOUNTER — Ambulatory Visit: Admitting: Family Medicine

## 2025-01-04 VITALS — BP 134/82 | HR 75 | Temp 97.9°F | Ht 59.0 in | Wt 163.0 lb

## 2025-01-04 DIAGNOSIS — M545 Low back pain, unspecified: Secondary | ICD-10-CM | POA: Diagnosis not present

## 2025-01-04 DIAGNOSIS — F5104 Psychophysiologic insomnia: Secondary | ICD-10-CM | POA: Diagnosis not present

## 2025-01-04 DIAGNOSIS — E039 Hypothyroidism, unspecified: Secondary | ICD-10-CM

## 2025-01-04 DIAGNOSIS — G8929 Other chronic pain: Secondary | ICD-10-CM | POA: Diagnosis not present

## 2025-01-04 DIAGNOSIS — G894 Chronic pain syndrome: Secondary | ICD-10-CM

## 2025-01-04 DIAGNOSIS — R0602 Shortness of breath: Secondary | ICD-10-CM

## 2025-01-04 DIAGNOSIS — Z8673 Personal history of transient ischemic attack (TIA), and cerebral infarction without residual deficits: Secondary | ICD-10-CM | POA: Diagnosis not present

## 2025-01-04 DIAGNOSIS — G2581 Restless legs syndrome: Secondary | ICD-10-CM | POA: Diagnosis not present

## 2025-01-04 DIAGNOSIS — F419 Anxiety disorder, unspecified: Secondary | ICD-10-CM

## 2025-01-04 DIAGNOSIS — I6523 Occlusion and stenosis of bilateral carotid arteries: Secondary | ICD-10-CM

## 2025-01-04 DIAGNOSIS — R4189 Other symptoms and signs involving cognitive functions and awareness: Secondary | ICD-10-CM

## 2025-01-04 MED ORDER — ALBUTEROL SULFATE HFA 108 (90 BASE) MCG/ACT IN AERS: 2.0000 | INHALATION_SPRAY | Freq: Four times a day (QID) | RESPIRATORY_TRACT | 0 refills | Status: AC | PRN

## 2025-01-04 NOTE — Patient Instructions (Signed)
 Thank you for trusting us  with your health care.  Work on good sleep hygiene  Do not eat 3 hours before bed Do not drink fluids 2 hours before bed No screen time 1 hour before bed  If you are not asleep after 30 minutes of being in the bed, get up and do something until you are ready to go to sleep  Reduce your Ambien  to 5 mg nightly  No daytime naps!!!  I am referring you to [facility] pulmonology and they will call you to schedule

## 2025-01-04 NOTE — Progress Notes (Signed)
 "  New Patient Office Visit  Subjective    Patient ID: [name] [name], female    DOB: 01-11-1951  Age: 74 y.o. MRN: [id number]  CC:  Chief Complaint  Patient presents with   Establish Care    Bilateral foot swelling, put on lasix  and doesn't think it's helping.     Discussed the use of AI scribe software for clinical note transcription with the patient, who gave verbal consent to proceed.  History of Present Illness [name] [name] is a 74 year old female with atrial fibrillation and a history of stroke who presents to establish care. Her daughter is with her today.   Atrial fibrillation and anticoagulation - Atrial fibrillation managed with a blood thinner. - follows with cardiology, Dr. Ladona - No chest pain, shortness of breath, or palpitations reported.  Cerebrovascular disease - History of stroke. - Carotid artery stenosis with prior left carotid endarterectomy. - Leg stent placement for vascular disease.  Peripheral edema - Leg swelling present for the past two months. - cardiologist prescribes Lasix  40 mg daily. - No reported improvement with current diuretic therapy.  Chronic pain syndromes - Chronic low back pain with intermittent shooting pain down the leg. - History of back surgery in 2021. - Daily arm pain.  Cognitive impairment - Memory problems ongoing. - Awaiting further evaluation from neurology. - Family history of severe memory problems in grandmother.  Mood disorders - Anxiety and depression following her mother's death. - Takes citalopram  for mood symptoms.  She has been referred to psychiatry by Dr. Chalice   Sleep disturbance - Difficulty sleeping without Ambien . - Uses Ambien  for sleep.  Restless leg syndrome - Restless leg syndrome treated with Requip .  Asthma - Asthma managed with albuterol  rescue inhaler as needed. - Has fluticasone  maintenance inhaler but does not use it daily. - Requests a nebulizer due to increased inhaler  use.  Substance use and dietary habits - Former smoker and snuff user. - No alcohol or recreational drug use. - Attempting to reduce Peps[employer] intake for kidney health.  Dr. Ladona - cardiologist  Dr. Chalice- neurologist  Dr. Addie - orthopedist  Dr. Arlinda - hand specialist       Outpatient Encounter Medications as of 01/04/2025  Medication Sig   acitretin (SORIATANE) 25 MG capsule Take 25 mg by mouth daily.   albuterol  (VENTOLIN  HFA) 108 (90 Base) MCG/ACT inhaler Inhale 2 puffs into the lungs every 6 (six) hours as needed for wheezing or shortness of breath.   Calcium  Carbonate-Vit D-Min (CALCIUM  1200 PO) Take 1,200 mg by mouth daily.   Cholecalciferol  (VITAMIN D ) 50 MCG (2000 UT) tablet Take 2,000 Units by mouth daily.   citalopram  (CELEXA ) 20 MG tablet TAKE 1 TABLET BY MOUTH EVERY DAY   clopidogrel  (PLAVIX ) 75 MG tablet Take 1 tablet (75 mg total) by mouth daily.   diltiazem  (CARDIZEM  CD) 180 MG 24 hr capsule Take 1 capsule (180 mg total) by mouth daily.   ezetimibe  (ZETIA ) 10 MG tablet Take 1 tablet (10 mg total) by mouth daily.   furosemide  (LASIX ) 40 MG tablet Take 1 tablet (40 mg total) by mouth daily as needed for fluid or edema.   levothyroxine  (SYNTHROID ) 88 MCG tablet TAKE 1 TABLET BY MOUTH EVERY DAY BEFORE BREAKFAST   losartan  (COZAAR ) 25 MG tablet Take 1 tablet (25 mg total) by mouth every evening.   methocarbamol  (ROBAXIN ) 500 MG tablet TAKE 1 TABLET BY MOUTH EVERY 12 HOURS AS NEEDED   montelukast  (SINGULAIR )  10 MG tablet Take 1 tablet (10 mg total) by mouth daily.   Multiple Vitamin (MULTIVITAMIN WITH MINERALS) TABS tablet Take 1 tablet by mouth daily.   Omega-3 Fatty Acids (FISH OIL) 1000 MG CAPS Take 1,000 mg by mouth daily.   potassium chloride  (KLOR-CON  M) 10 MEQ tablet Take 2 tablets (20 mEq total) by mouth daily as needed (With Furosemide ).   rOPINIRole  (REQUIP ) 0.5 MG tablet Take 1 tablet (0.5 mg total) by mouth at bedtime.   rosuvastatin  (CRESTOR ) 40 MG tablet  Take 1 tablet (40 mg total) by mouth daily.   zolpidem  (AMBIEN ) 10 MG tablet Take 1 tablet (10 mg total) by mouth at bedtime as needed. for sleep   Aspirin -Acetaminophen -Caffeine (GOODY HEADACHE PO) Take by mouth. (Patient not taking: Reported on 01/04/2025)   [DISCONTINUED] predniSONE  (DELTASONE ) 20 MG tablet Take 1 tablet (20 mg total) by mouth daily with breakfast.   No facility-administered encounter medications on file as of 01/04/2025.    Past Medical History:  Diagnosis Date   Acid reflux    ALCOHOL ABUSE, HX OF 11/06/2007   ALLERGIC RHINITIS 11/06/2007   Allergy    Anxiety 04/03/2011   ASTHMA 09/13/2007   Asthma    Cataract    Chronic pain syndrome 12/15/2016   COLONIC POLYPS, HX OF 02/09/2008    ADENOMATOUS POLYP   Coronary artery disease    Depression    Encounter for well adult exam without abnormal findings 04/03/2011   HYPERLIPIDEMIA 11/03/2010   HYPERTENSION 09/13/2007   HYPOTHYROIDISM 11/06/2007   Impaired glucose tolerance 07/30/2013   INSOMNIA, HX OF 09/13/2007   Lumbar degenerative disc disease 12/15/2016   OSTEOPOROSIS 11/06/2007   PAD (peripheral artery disease)    PERIMENOPAUSAL STATUS 09/13/2007   RLS (restless legs syndrome)    Stroke [facility])    Substance abuse (HCC)    TIA (transient ischemic attack) 11/04/2011    Past Surgical History:  Procedure Laterality Date   ABDOMINAL AORTOGRAM W/LOWER EXTREMITY N/A 08/27/2020   Procedure: ABDOMINAL AORTOGRAM W/LOWER EXTREMITY;  Surgeon: Elmira Newman PARAS, MD;  Location: [facility];  Service: Cardiovascular;  Laterality: N/A;   BREAST EXCISIONAL BIOPSY Left    BREAST SURGERY  2008 and 2012   x 2 - benign, left side   carotid artery surgery Left    CESAREAN SECTION     x 3   COLONOSCOPY  multiple   2010   PERIPHERAL VASCULAR BALLOON ANGIOPLASTY  08/27/2020   Procedure: PERIPHERAL VASCULAR BALLOON ANGIOPLASTY;  Surgeon: Elmira Newman PARAS, MD;  Location: [facility];  Service:  Cardiovascular;;  Right SFA scoring balloon   SHOULDER ARTHROSCOPY WITH SUBACROMIAL DECOMPRESSION, ROTATOR CUFF REPAIR AND BICEP TENDON REPAIR Right 06/08/2023   Procedure: RIGHT SHOULDER ARTHROSCOPY, DEBRIDEMENT, MINI OPEN ROTATOR CUFF TEAR REPAIR AND BICEPS TENODESIS;  Surgeon: Addie Cordella Hamilton, MD;  Location: [facility] OR;  Service: Orthopedics;  Laterality: Right;   TRANSFORAMINAL LUMBAR INTERBODY FUSION (TLIF) WITH PEDICLE SCREW FIXATION 1 LEVEL Left 07/10/2020   Procedure: LEFT-SIDED LUMBAR FOUR-FIVE TRANSFORAMINAL LUMBAR INTERBODY FUSION WITH INSTRUMENTATION AND ALLOGRAFT;  Surgeon: Beuford Anes, MD;  Location: [facility] OR;  Service: Orthopedics;  Laterality: Left;    Family History  Problem Relation Age of Onset   Hypertension Mother 44   Heart disease Mother    Heart failure Mother 8   Colon polyps Brother    Hypertension Brother    Colon cancer Brother 16   Dementia Maternal Grandmother    Breast cancer Cousin  Hyperlipidemia Other    Hypertension Other    Coronary artery disease Other    Esophageal cancer Neg Hx    Rectal cancer Neg Hx    Stomach cancer Neg Hx     Social History   Socioeconomic History   Marital status: Divorced    Spouse name: Not on file   Number of children: 3   Years of education: Not on file   Highest education level: Not on file  Occupational History   Occupation: YOUTH WORKER    Employer: [employer]  Tobacco Use   Smoking status: Former    Current packs/day: 0.00    Average packs/day: 0.3 packs/day    Types: Cigarettes    Quit date: 05/31/1979    Years since quitting: 45.6   Smokeless tobacco: Former    Types: Snuff    Quit date: 2024   Tobacco comments:    social smoker  Vaping Use   Vaping status: Never Used  Substance and Sexual Activity   Alcohol use: Not Currently    Comment: 2004   Drug use: Not Currently    Types: Marijuana    Comment: mariuana years ago   Sexual activity: Not Currently    Birth  control/protection: Post-menopausal  Other Topics Concern   Not on file  Social History Narrative   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)   Divorced   3 children   Watches grandchildren for daughter in [place] school   No EtOH, tobacco, drugs   Social Drivers of Health   Tobacco Use: Medium Risk (01/04/2025)   Patient History    Smoking Tobacco Use: Former    Smokeless Tobacco Use: Former    Passive Exposure: Not on Actuary Strain: Low Risk (09/05/2024)   Overall Financial Resource Strain (CARDIA)    Difficulty of Paying Living Expenses: Not hard at all  Food Insecurity: No Food Insecurity (09/05/2024)   Epic    Worried About Radiation Protection Practitioner of Food in the Last Year: Never true    Ran Out of Food in the Last Year: Never true  Transportation Needs: No Transportation Needs (09/05/2024)   Epic    Lack of Transportation (Medical): No    Lack of Transportation (Non-Medical): No  Physical Activity: Insufficiently Active (09/05/2024)   Exercise Vital Sign    Days of Exercise per Week: 3 days    Minutes of Exercise per Session: 30 min  Stress: No Stress Concern Present (09/05/2024)   [employer] of Occupational Health - Occupational Stress Questionnaire    Feeling of Stress: Not at all  Social Connections: Moderately Integrated (09/05/2024)   Social Connection and Isolation Panel    Frequency of Communication with Friends and Family: More than three times a week    Frequency of Social Gatherings with Friends and Family: More than three times a week    Attends Religious Services: More than 4 times per year    Active Member of Clubs or Organizations: Yes    Attends Banker Meetings: More than 4 times per year    Marital Status: Divorced  Intimate Partner Violence: Not At Risk (09/05/2024)   Epic    Fear of Current or Ex-Partner: No    Emotionally Abused: No    Physically Abused: No    Sexually Abused: No  Depression (PHQ2-9): Low Risk (09/05/2024)    Depression (PHQ2-9)    PHQ-2 Score: 0  Alcohol Screen: Low Risk (09/05/2024)   Alcohol Screen  Last Alcohol Screening Score (AUDIT): 0  Housing: Unknown (09/05/2024)   Epic    Unable to Pay for Housing in the Last Year: No    Number of Times Moved in the Last Year: Not on file    Homeless in the Last Year: No  Utilities: Not At Risk (09/05/2024)   Epic    Threatened with loss of utilities: No  Health Literacy: Adequate Health Literacy (09/05/2024)   B1300 Health Literacy    Frequency of need for help with medical instructions: Never    ROS Per HPI      Objective    BP 134/82   Pulse 75   Temp 97.9 F (36.6 C) (Temporal)   Ht 4' 11 (1.499 m)   Wt 163 lb (73.9 kg)   SpO2 95%   BMI 32.92 kg/m   Physical Exam Constitutional:      General: She is not in acute distress.    Appearance: She is not ill-appearing.  HENT:     Mouth/Throat:     Mouth: Mucous membranes are moist.     Pharynx: Oropharynx is clear.  Eyes:     Extraocular Movements: Extraocular movements intact.     Conjunctiva/sclera: Conjunctivae normal.     Pupils: Pupils are equal, round, and reactive to light.  Cardiovascular:     Rate and Rhythm: Normal rate.  Pulmonary:     Effort: Pulmonary effort is normal.  Musculoskeletal:     Cervical back: Normal range of motion and neck supple.     Right lower leg: No edema.     Left lower leg: No edema.  Skin:    General: Skin is warm and dry.  Neurological:     General: No focal deficit present.     Mental Status: She is alert and oriented to person, place, and time.  Psychiatric:        Mood and Affect: Mood normal.        Behavior: Behavior normal.        Thought Content: Thought content normal.         Assessment & Plan:   Problem List Items Addressed This Visit     Anxiety (Chronic)   Asymptomatic bilateral carotid artery stenosis   Chronic insomnia   Relevant Orders   Ambulatory referral to Pulmonology   Chronic pain syndrome    Hypothyroidism - Primary   RLS (restless legs syndrome)   Other Visit Diagnoses       History of stroke         Chronic midline low back pain, unspecified whether sciatica present         Shortness of breath       Relevant Orders   Ambulatory referral to Pulmonology     Concern about memory           Assessment and Plan Assessment & Plan Chronic insomnia with medication dependence Chronic insomnia with long-term dependence on Ambien  (zolpidem ) 10 mg, posing risks of memory impairment and potential dementia. She has developed a habit of relying on the medication for sleep. - Wean off Ambien  over the next couple of months, starting with reducing the dose to 5 mg. - Educated on sleep hygiene: no screens an hour before bed, no food three hours before bed, no drinks two hours before bed, and ensure a dark, quiet room. - Encouraged behavioral changes to improve sleep quality.  Atrial fibrillation Managed by Dr. Ladona. She is on anticoagulation therapy with Plavix  and rate  control with diltiazem .  History of stroke Stroke managed with anticoagulation therapy and cholesterol management to prevent recurrence.  Carotid artery stenosis, post left endarterectomy Carotid artery stenosis with previous endarterectomy on the left side. Managed with cholesterol-lowering medications to prevent further vascular events. - ?followed by vascular- follow up next visit on this   Chronic low back pain, post-surgery Chronic low back pain post-surgery in 2021, located in the center of the lower back with radiation down the leg. Managed with muscle relaxants as needed.  Restless legs syndrome Managed with Requip .  Anxiety disorder Managed with citalopram , primarily for anxiety rather than depression. - referred to psychiatry per neurologist but no mention of it today, will need to follow up  Acquired hypothyroidism Managed with levothyroxine , taken on an empty stomach in the  morning.  Asthma Diagnosed by Dr. Norleen. She has not been using her maintenance inhaler daily and lacks a rescue inhaler. Increased use of asthma medication noted with recent weather changes. - Prescribed albuterol  rescue inhaler. - Referred to pulmonology for lung function testing to assess asthma and potential COPD.     Return in about 4 weeks (around 02/01/2025) for Fasting follow up.   Boby Mackintosh, NP-C   "

## 2025-01-05 ENCOUNTER — Telehealth: Payer: Self-pay

## 2025-01-05 NOTE — Telephone Encounter (Signed)
 Copied from CRM #[id number]. Topic: General - Other >> Jan 05, 2025  8:51 AM [name] [name] wrote: Reason for CRM: Patient called in stating that she had a sleep study done and she has insomnia and not sleep apnea. She just wanted NP Henson to be aware. No cb is needed per the patient.

## 2025-01-05 NOTE — Telephone Encounter (Signed)
 noted

## 2025-01-10 ENCOUNTER — Encounter: Admitting: Family Medicine

## 2025-01-10 NOTE — Telephone Encounter (Signed)
 Called patient to schedule EEG. Schedule 01/12/25 at 10:45 am

## 2025-01-12 ENCOUNTER — Other Ambulatory Visit: Admitting: Neurology

## 2025-02-04 ENCOUNTER — Other Ambulatory Visit

## 2025-02-06 ENCOUNTER — Ambulatory Visit: Admitting: Family Medicine

## 2025-02-21 ENCOUNTER — Ambulatory Visit: Admitting: Pulmonary Disease

## 2025-04-23 ENCOUNTER — Ambulatory Visit: Admitting: Family Medicine

## 2025-05-02 ENCOUNTER — Ambulatory Visit: Admitting: Family Medicine
